# Patient Record
Sex: Female | Born: 1935 | Race: White | Hispanic: No | Smoking: Former smoker
Health system: Southern US, Community
[De-identification: ages and names within clinical notes are randomized; demographics above are authoritative.]

## PROBLEM LIST (undated history)

## (undated) DIAGNOSIS — Z8619 Personal history of other infectious and parasitic diseases: Secondary | ICD-10-CM

## (undated) DIAGNOSIS — F32A Depression, unspecified: Secondary | ICD-10-CM

## (undated) DIAGNOSIS — Z87898 Personal history of other specified conditions: Secondary | ICD-10-CM

## (undated) DIAGNOSIS — E039 Hypothyroidism, unspecified: Secondary | ICD-10-CM

## (undated) DIAGNOSIS — Z862 Personal history of diseases of the blood and blood-forming organs and certain disorders involving the immune mechanism: Secondary | ICD-10-CM

## (undated) DIAGNOSIS — F329 Major depressive disorder, single episode, unspecified: Secondary | ICD-10-CM

## (undated) DIAGNOSIS — I10 Essential (primary) hypertension: Secondary | ICD-10-CM

## (undated) DIAGNOSIS — E785 Hyperlipidemia, unspecified: Secondary | ICD-10-CM

## (undated) DIAGNOSIS — Z974 Presence of external hearing-aid: Secondary | ICD-10-CM

## (undated) DIAGNOSIS — M1990 Unspecified osteoarthritis, unspecified site: Secondary | ICD-10-CM

## (undated) DIAGNOSIS — M810 Age-related osteoporosis without current pathological fracture: Secondary | ICD-10-CM

## (undated) HISTORY — DX: Major depressive disorder, single episode, unspecified: F32.9

## (undated) HISTORY — DX: Personal history of other specified conditions: Z87.898

## (undated) HISTORY — DX: Presence of external hearing-aid: Z97.4

## (undated) HISTORY — DX: Personal history of other infectious and parasitic diseases: Z86.19

## (undated) HISTORY — DX: Depression, unspecified: F32.A

## (undated) HISTORY — DX: Age-related osteoporosis without current pathological fracture: M81.0

## (undated) HISTORY — DX: Unspecified osteoarthritis, unspecified site: M19.90

## (undated) HISTORY — DX: Hyperlipidemia, unspecified: E78.5

## (undated) HISTORY — DX: Essential (primary) hypertension: I10

## (undated) HISTORY — DX: Hypothyroidism, unspecified: E03.9

## (undated) HISTORY — DX: Personal history of diseases of the blood and blood-forming organs and certain disorders involving the immune mechanism: Z86.2

---
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
## 1972-12-30 HISTORY — PX: GALLBLADDER SURGERY: SHX652

## 1972-12-30 HISTORY — PX: APPENDECTOMY: SHX54

## 1977-12-30 HISTORY — PX: BACK SURGERY: SHX140

## 1985-12-30 HISTORY — PX: KNEE SURGERY: SHX244

## 1996-12-30 HISTORY — PX: KNEE SURGERY: SHX244

## 1998-04-03 ENCOUNTER — Other Ambulatory Visit: Admission: RE | Admit: 1998-04-03 | Discharge: 1998-04-03 | Payer: Self-pay | Admitting: Family Medicine

## 1998-04-10 ENCOUNTER — Other Ambulatory Visit: Admission: RE | Admit: 1998-04-10 | Discharge: 1998-04-10 | Payer: Self-pay | Admitting: Family Medicine

## 1998-12-30 LAB — HM DEXA SCAN: HM Dexa Scan: NORMAL

## 2001-10-16 ENCOUNTER — Other Ambulatory Visit: Admission: RE | Admit: 2001-10-16 | Discharge: 2001-10-16 | Payer: Self-pay | Admitting: *Deleted

## 2002-03-23 ENCOUNTER — Encounter: Admission: RE | Admit: 2002-03-23 | Discharge: 2002-03-23 | Payer: Self-pay | Admitting: *Deleted

## 2005-02-08 ENCOUNTER — Inpatient Hospital Stay (HOSPITAL_COMMUNITY): Admission: RE | Admit: 2005-02-08 | Discharge: 2005-02-12 | Payer: Self-pay | Admitting: Orthopedic Surgery

## 2005-02-08 ENCOUNTER — Ambulatory Visit: Payer: Self-pay | Admitting: Physical Medicine & Rehabilitation

## 2006-12-30 HISTORY — PX: KNEE SURGERY: SHX244

## 2007-05-02 ENCOUNTER — Emergency Department (HOSPITAL_COMMUNITY): Admission: EM | Admit: 2007-05-02 | Discharge: 2007-05-02 | Payer: Self-pay | Admitting: Emergency Medicine

## 2007-05-09 ENCOUNTER — Inpatient Hospital Stay (HOSPITAL_COMMUNITY): Admission: RE | Admit: 2007-05-09 | Discharge: 2007-05-11 | Payer: Self-pay | Admitting: Orthopedic Surgery

## 2007-05-11 ENCOUNTER — Ambulatory Visit: Payer: Self-pay | Admitting: Vascular Surgery

## 2007-07-27 ENCOUNTER — Encounter: Admission: RE | Admit: 2007-07-27 | Discharge: 2007-08-27 | Payer: Self-pay | Admitting: Orthopedic Surgery

## 2008-06-21 ENCOUNTER — Encounter: Admission: RE | Admit: 2008-06-21 | Discharge: 2008-07-18 | Payer: Self-pay | Admitting: Orthopedic Surgery

## 2008-10-19 ENCOUNTER — Encounter: Admission: RE | Admit: 2008-10-19 | Discharge: 2008-10-19 | Payer: Self-pay | Admitting: Internal Medicine

## 2008-12-30 HISTORY — PX: CARDIOVASCULAR STRESS TEST: SHX262

## 2008-12-30 HISTORY — PX: US ECHOCARDIOGRAPHY: HXRAD669

## 2009-05-30 DEATH — deceased

## 2009-07-18 ENCOUNTER — Inpatient Hospital Stay (HOSPITAL_COMMUNITY): Admission: RE | Admit: 2009-07-18 | Discharge: 2009-07-20 | Payer: Self-pay | Admitting: Orthopedic Surgery

## 2009-07-31 ENCOUNTER — Ambulatory Visit: Payer: Self-pay | Admitting: Surgery

## 2009-07-31 ENCOUNTER — Ambulatory Visit: Admission: RE | Admit: 2009-07-31 | Discharge: 2009-07-31 | Payer: Self-pay | Admitting: Orthopedic Surgery

## 2009-07-31 ENCOUNTER — Encounter (INDEPENDENT_AMBULATORY_CARE_PROVIDER_SITE_OTHER): Payer: Self-pay | Admitting: Orthopedic Surgery

## 2009-08-23 ENCOUNTER — Encounter: Admission: RE | Admit: 2009-08-23 | Discharge: 2009-09-28 | Payer: Self-pay | Admitting: Orthopedic Surgery

## 2010-07-31 ENCOUNTER — Encounter: Admission: RE | Admit: 2010-07-31 | Discharge: 2010-07-31 | Payer: Self-pay | Admitting: Nurse Practitioner

## 2010-09-04 ENCOUNTER — Encounter: Admission: RE | Admit: 2010-09-04 | Discharge: 2010-09-04 | Payer: Self-pay | Admitting: Internal Medicine

## 2010-12-30 HISTORY — PX: CATARACT EXTRACTION: SUR2

## 2011-04-07 LAB — URINALYSIS, ROUTINE W REFLEX MICROSCOPIC
Hgb urine dipstick: NEGATIVE
Nitrite: NEGATIVE
Specific Gravity, Urine: 1.026 (ref 1.005–1.030)
Urobilinogen, UA: 0.2 mg/dL (ref 0.0–1.0)

## 2011-04-07 LAB — BASIC METABOLIC PANEL
CO2: 28 mEq/L (ref 19–32)
CO2: 27 mEq/L (ref 19–32)
Calcium: 9.1 mg/dL (ref 8.4–10.5)
Calcium: 9.2 mg/dL (ref 8.4–10.5)
Calcium: 10.2 mg/dL (ref 8.4–10.5)
Creatinine, Ser: 0.85 mg/dL (ref 0.4–1.2)
GFR calc Af Amer: >60 mL/min (ref >60)
GFR calc Af Amer: >60 mL/min (ref >60)
GFR calc non Af Amer: >60 mL/min (ref >60)
GFR calc non Af Amer: 58 mL/min — ABNORMAL LOW (ref >60)
Glucose, Bld: 102 mg/dL — ABNORMAL HIGH (ref 70–99)
Glucose, Bld: 104 mg/dL — ABNORMAL HIGH (ref 70–99)
Sodium: 141 mEq/L (ref 135–145)
Sodium: 140 mEq/L (ref 135–145)

## 2011-04-07 LAB — CBC
Hemoglobin: 9.5 g/dL — ABNORMAL LOW (ref 12.0–15.0)
Hemoglobin: 12.5 g/dL (ref 12.0–15.0)
MCHC: 33.5 g/dL (ref 30.0–36.0)
RBC: 4.42 MIL/uL (ref 3.87–5.11)
RDW: 13.8 % (ref 11.5–15.5)
RDW: 14.1 % (ref 11.5–15.5)
WBC: 5.1 10*3/uL (ref 4.0–10.5)

## 2011-04-07 LAB — TYPE AND SCREEN: ABO/RH(D): A POS

## 2011-04-07 LAB — DIFFERENTIAL
Lymphocytes Relative: 22 % (ref 12–46)
Lymphs Abs: 1.1 10*3/uL (ref 0.7–4.0)
Monocytes Absolute: 0.4 10*3/uL (ref 0.1–1.0)
Monocytes Relative: 8 % (ref 3–12)
Neutro Abs: 3.4 10*3/uL (ref 1.7–7.7)
Neutrophils Relative %: 67 % (ref 43–77)

## 2011-04-07 LAB — PROTIME-INR: INR: 1 (ref 0.00–1.49)

## 2011-04-07 LAB — APTT: aPTT: 31 seconds (ref 24–37)

## 2011-04-07 LAB — ABO/RH: ABO/RH(D): A POS

## 2011-05-14 NOTE — Op Note (Signed)
[name], [name]                ACCOUNT NO.:  [id number]   MEDICAL RECORD NO.:  [id number]          PATIENT TYPE:  INP   LOCATION:  2550                         FACILITY:  [facility]   PHYSICIAN:  Doran Heater. Veverly Fells, M.D. DATE OF BIRTH:  [birth year]   DATE OF PROCEDURE:  DATE OF DISCHARGE:                               OPERATIVE REPORT   PREOPERATIVE DIAGNOSIS:  Right displaced three-part proximal humerus  fracture.   POSTOPERATIVE DIAGNOSIS:  Right displaced three-part proximal humerus  fracture.   PROCEDURE PERFORMED:  Open reduction and internal fixation of right  three-part proximal humerus fracture using DePuy SNP nail plate system.   SURGEON:  Doran Heater. Veverly Fells, M.D.   ASSISTANT:  Abbott Pao. Doren Custard, P.A.   ANESTHESIA:  General anesthesia was used.   ESTIMATED BLOOD LOSS:  300 mL.   FLUID REPLACEMENT:  2200 mL crystalloid.   URINE OUTPUT:  500 mL.   INSTRUMENT COUNT:  Correct.   COMPLICATIONS:  None.   ANTIBIOTICS:  Preoperative antibiotics were given.   INDICATION:  The patient is a 77 year old female who suffered a fall,  injuring her right proximal arm.  The patient complained of immediate  shoulder pain and loss of shoulder function.  She presented to  orthopedics with a displaced three part proximal humerus fracture.  I  discussed the options for management with the patient and her family  including conservative management, which I would not recommend based on  significant displacement of her greater tuberosity which contains a  rotator cuff attachment as well as severe valgus impaction of her  humeral head.  I discussed with the family the option for attempted open  reduction and internal fixation using a nail plate system to restore  proximal humeral anatomy and keep her humeral head and soft tissue  attachments versus hemiarthroplasty which would be the fall back plan  should we not be able to obtain a stable construct proximally with the  nail plate.  The  patient elected to proceed with the surgical  management.  She has signed informed consent.   DESCRIPTION OF PROCEDURE:  After an adequate level of anesthesia was  achieved, the patient was positioned in the modified beach chair  position.  All neurovascular structures were padded appropriately.  The  right shoulder was sterilely prepped and draped in the usual manner.  We  approached the shoulder through an anterior deltopectoral incision.  We  took the deltopectoral laterally to the cephalic vein, the pectoralis  medially, released the upper centimeter of pectoralis insertion on the  humerus, and a little bit of the deltoid insertion as well.  She was  extremely muscular and the deltoid seemed to sweep across more medially  than normal.  We first identified the fracture site.  We took the  conjoint tendon medially.  Placed a self-retaining retractor, identified  the fracture site and the greater tuberosity.  We were able to fish the  greater tuberosity around from the posterior aspect of the shoulder and  it retracted way around the back.  We were able to find it, grab it and  place  a total three #2 FiberWire sutures in a mattress fashion posterior  to the greater tuberosity and into the rotator cuff.  We had excellent  purchase and control of the greater tuberosity.  Next, we identified the  fractured humerus.  We delivered the shaft so that we could see that  easily.  We went ahead and took a rongeur and bit out a hole for the  nail plate system to sit on the lateral humerus.  We placed the nail  plate on its insertion handle into the humerus and then used that to  lever the fractured humeral head back into position.  Once we had this  in anatomic or near anatomic position, we went ahead and placed a  central pin to hold the position and then placed our upper two smooth  pegs which this is a locking plate system.  The pegs locked into place  and held the head in good position.  X-rays  demonstrated excellent  alignment.  We placed our remaining three pegs gaining excellent  purchase in the humeral head.  Next, we went ahead and placed our three  unicortical screws through the lateral cortex of the humerus and into  the nail plate, pulling the cortex pulling the nail against the lateral  cortex and reducing the fracture even more.  This gave [place] an anatomic  reduction both in the AP and lateral plane.  We then bone grafted using  morselized cancellous chips allograft which had been soaked in blood and  bone grafted the large cancellous defect and metaphyseal defect present  around the pegs and then went ahead and brought the greater tuberosity  into place and sutured that to the subscapularis over the lesser  tuberosity gaining a nice tuberosity to tuberosity purchase.  We had a  low profile repair.  We took the shoulder through a range of motion.  Everything moved as a unit.  At this point, we thoroughly irrigated and  closed the deltopectoral interval with interrupted 0 Vicryl sutures,  followed by 2-0 Vicryl subcutaneous closure and 4-0 Monocryl for skin.  Steri-Strips applied, followed by sterile dressing.  The patient  tolerated the surgery well.      Doran Heater. Veverly Fells, M.D.  Electronically Signed     SRN/MEDQ  D:  05/09/2007  T:  05/09/2007  Job:  [id number]

## 2011-05-14 NOTE — Op Note (Signed)
[name], [name]                ACCOUNT NO.:  [id number]   MEDICAL RECORD NO.:  [id number]          PATIENT TYPE:  INP   LOCATION:  0011                         FACILITY:  [facility]   PHYSICIAN:  Pietro Cassis. Alvan Dame, M.D.  DATE OF BIRTH:  09-Mar-1930   DATE OF PROCEDURE:  07/18/2009  DATE OF DISCHARGE:                               OPERATIVE REPORT   PREOPERATIVE DIAGNOSIS:  Left knee osteoarthritis.   POSTOPERATIVE DIAGNOSIS:  Left knee osteoarthritis.   PROCEDURE:  Left total knee replacement.   COMPONENTS USED:  DePuy rotating platform posterior stabilized knee  system with a size 5 femur, 4 tibia, 10-mm insert and a 41 patellar  button.   SURGEON:  Pietro Cassis. Alvan Dame, M.D.   ASSISTANT:  Carlean Jews. Mann, PA.   ANESTHESIA:  Spinal.   DRAINS:  One Hemovac.   TOURNIQUET TIME:  Forty minutes at 250 mmHg.   COMPLICATIONS:  None.   BLOOD LOSS:  Minimal.   SPECIMEN:  None.   INDICATIONS:  [name] is a 77 year old female patient of mine from  previous right knee revision surgery.  She had progressive degenerative  changes to the left knee with pain that was unresponsive to conservative  measures.  She at this point wished to proceed with arthroplasty as an  option.  The risks and benefits were reviewed, including infection, DVT,  component failure, need for revision surgery, stiffness, including the  postoperative course and expectations.  Consent was obtained for the  benefit of pain relief.   PROCEDURE IN DETAIL:  The patient was brought to the operative theater.  Once adequate anesthesia and preoperative antibiotics were administered,  the patient was positioned supine.  A proximal thigh tourniquet was  placed.  The left lower extremity was then pre-scrubbed, prepped and  draped in a sterile fashion.   A timeout was performed identifying the patient, planned procedure and  extremity.  Once this was done, the patient's foot was placed in demand  leg holder and tourniquet  elevated.  The leg was exsanguinated to 250  mmHg.  A midline incision was made followed by a median arthrotomy.  Following initial debridement, attention was directed to the patella.  The patient's patella was noted to be significantly worn and the highest  apex point was at the 21 mm site.  I resected down to 41 mm and restored  the height of the patella using a 41 patellar button which fit the cut  surface great.   Synovium was debrided about the patella.   A metal shim was placed into the lug holes and protected from the saw  blades and retractors.  Attention was now directed to the femur.  The  femoral canal was open with a drill and irrigated to prevent fat emboli.  Intramedullary rod was passed in 3 degrees of valgus.  I resected 10 mm  of bone off the distal femur due to a flexion contracture.   Attention was now directed to the tibia with the tibia subluxating  anteriorly and retractors placed.  An extramedullary guide was placed  and based on the  lateral proximal tibia, 10 mm of bone was resected.  I  checked my extension gap and was found that the 10-mm insert felt nice  with stable medial and lateral ligaments.   At this point, I checked and confirmed that the cut was perpendicular  with an alignment rod in the coronal plane and straight down the mid  shaft of the tibia.   Based on this cut, the rotation of the femur was set based on this.  I  sized the femur to be a size 5 and then placed the 3 degree  intramedullary rod with the rotation block and then set the rotation  based on the proximal cut of the tibia.  It was pinned into position and  exchanged for the 4-in-1 cutting block.  Anterior-posterior and chamfer  cuts were then made without difficulty, no notching.  Final box cut was  made on the lateral aspect of the distal femur.  Attention was now  redirected back to the tibia.  Tibial cut surface seemed to fit best  with a size 4 tibial tray.  The tibial tray was  pinned in position,  drilled and keel punched.  At this point trial reduction was carried out  with a 5 femur, 4 tibial and 10 mm insert.  The patella tracked to the  trochlea through extension and flexion.  The knee ligaments were stable  with extension and flexion.  Given these findings, the trial components  were removed and we irrigated the knee with normal saline solution,  removed any debris, injected the synovial capsule junction with quarter-  percent Marcaine with epinephrine and 1 mL of Toradol for a total of 61  mL.  The cement was mixed and final components were then cemented into  position.  The knee was brought to extension with a 10 mm insert.  Extruded cement was removed.  Once cement had cured and excessive cement  was removed from the around the knee, the knee was brought into full  extension.  The final polyethylene was placed.  The knee was reirrigated  with saline solution.  The extensor mechanism was then reapproximated  using #1 Vicryl.  I did place a medium Hemovac drain deep.  The  tourniquet was let down at 40 minutes at 250 mmHg.   The remainder of the wound was closed with 2-0 Vicryl and running 4-0  Monocryl.  The knee was cleaned, dried and dressed sterilely with Steri-  Strips and bulky skin wrap.  She was brought to the recovery room in  stable condition tolerating the procedure well.      Pietro Cassis Alvan Dame, M.D.  Electronically Signed     MDO/MEDQ  D:  07/18/2009  T:  07/19/2009  Job:  [id number]

## 2011-05-14 NOTE — Discharge Summary (Signed)
[name], [name]                ACCOUNT NO.:  [id number]   MEDICAL RECORD NO.:  [id number]          PATIENT TYPE:  INP   LOCATION:  5011                         FACILITY:  [facility]   PHYSICIAN:  [name]. [name], M.D. DATE OF BIRTH:  07/26/1930   DATE OF ADMISSION:  05/09/2007  DATE OF DISCHARGE:  05/11/2007                               DISCHARGE SUMMARY   ADMISSION DIAGNOSIS:  Right proximal humerus fracture.   DISCHARGE DIAGNOSIS:  Right proximal humerus fracture status post open  reduction, internal fixation.   PROCEDURE:  The patient had a right proximal humerus ORIF by Dr. Esmond Plants on May 09, 2007.   ASSISTANT:  Abbott Pao. Dixon, PA-C   General anesthesia was used, 300 mL of estimated blood loss.  Fluid  replacement was 2200 mL.  No complications.   HOSPITAL COURSE:  The patient is a 77 year old female.  She suffered a  fall and injured her right proximal humerus.  The patient had come into  the office, and after a lengthy discussion, the patient elected to have  a right proximal humerus ORIF by Dr. Veverly Wu.  The patient had the above-  stated procedure without any complications on May 09, 2007.  She  tolerated the procedure well.  After adequate time in the postanesthetic  care unit, she was transferred up to 5000.  Postop day 1, the patient  complained of some moderate pain in the right shoulder, but otherwise  doing okay.  Postop day 2, she said her pain is improved, and muscle  spasms had improved.  She was in a sling.  Dressing was changed.  No  signs of erythema or infection.  Neurologically, she is intact distally,  and capillary refill is less than 2 seconds.  Her labs were within  normal limits.   DISCHARGE PLANNING:  The patient will be discharged home on May 11, 2007, with family and home health physical therapy.   FOLLOWUP:  The patient will follow up with Dr. Esmond Plants in 7-10  days.   Her condition is stable.   Her diet is regular.   The  patient has no known drug allergies.   MEDICATIONS:  1. Diltiazem 240 mg p.o. daily.  2. Pepcid 20 mg p.o. b.i.d.  3. Prozac 20 mg p.o. daily.  4. Levothyroxine 175 mcg p.o. daily.  5. Lisinopril 40 mg p.o. daily.  6. Robaxin 500 mg p.o. q.6.  7. Vicodin 5 mg 1-2 tabs q.4-6 h. p.r.n. for pain.      [name], P.A.      [name]. [name], M.D.  Electronically Signed    TBD/MEDQ  D:  05/11/2007  T:  05/11/2007  Job:  [id number]

## 2011-05-14 NOTE — H&P (Signed)
[name], ASANO                ACCOUNT NO.:  [id number]   MEDICAL RECORD NO.:  [id number]          PATIENT TYPE:  INP   LOCATION:  NA                           FACILITY:  [facility]   PHYSICIAN:  Pietro Cassis. Alvan Dame, M.D.  DATE OF BIRTH:  13-Dec-1930   DATE OF ADMISSION:  07/18/2009  DATE OF DISCHARGE:                              HISTORY & PHYSICAL   PROCEDURE:  A left total knee replacement.   CHIEF COMPLAINT:  Left knee pain.   HISTORY OF PRESENT ILLNESS:  A 77 year old female with a history of left  knee pain secondary to osteoarthritis that has been refractory to all  conservative treatment.   PRIMARY CARE PHYSICIAN:  Dr. Delia Chimes.   PAST MEDICAL HISTORY:  1. Osteoarthritis.  2. Hypertension.  3. Reflux disease.  4. Hyperthyroidism.   PAST SURGERIES:  1. Right shoulder surgery.  2. Revision right total knee replacement.   FAMILY HISTORY:  Cancer.   SOCIAL HISTORY:  Widowed.  Retired.  Primary caregiver will be her  daughter in the home.   DRUG ALLERGIES:  NO KNOWN DRUG ALLERGIES.   MEDICATIONS:  1. Diltiazem 240 mg p.o. daily.  2. Lisinopril 40 mg p.o. b.i.d.  3. Fluoxetine 20 mg p.o. daily.  4. Levothyroxine 175 mcg half p.o. daily.  5. Klor-Con 20 mEq p.o. daily.  6. Furosemide 80 mg one p.o. daily.  7. Ranitidine 150 mg two p.o. b.i.d.  8. Calcium 600 mg p.o. b.i.d.  9. Tylenol p.r.n.  10.Vicodin 5/500 two p.o. daily p.r.n.   REVIEW OF SYSTEMS:  RESPIRATORY:  Last chest x-ray was 2 months ago.  CARDIOVASCULAR:  Last EKG was on June 20, 2009.  MUSCULOSKELETAL:  She  has joint pain.  Otherwise, see HPI.   PHYSICAL EXAMINATION:  Pulse 72.  Respirations 16.  Blood pressure  114/70.  GENERAL:  Awake, alert, and oriented.  HEENT:  Normocephalic.  NECK:  Supple.  No carotid bruits.  CHEST:  Lungs clear to auscultation bilaterally.  BREASTS:  Deferred.  HEART:  S1 and S2, distinct.  ABDOMEN:  Soft.  Bowel sounds present.  PELVIS:  Stable.  GENITOURINARY:   Deferred.  EXTREMITIES:  Left knee pain with weightbearing and range of motion.  Uses a single-point cane.  SKIN:  No cellulitis.  NEUROLOGIC:  Intact distal sensibilities.   LABORATORY DATA:  Labs, EKG, chest x-ray all pending presurgical  testing.   IMPRESSION:  Left knee osteoarthritis.   PLAN OF ACTION:  Left total knee replacement, Dr. Alvan Dame at [facility] on  July 18, 2009.     ______________________________  Carlean Jews. Collene Mares, [place]      Pietro Cassis. Alvan Dame, M.D.  Electronically Signed    BLM/MEDQ  D:  07/14/2009  T:  07/14/2009  Job:  [id number]   cc:   Delia Chimes, M.D.

## 2011-05-14 NOTE — Discharge Summary (Signed)
NAMEJENNEAN, [name]                ACCOUNT NO.:  [id number]   MEDICAL RECORD NO.:  [id number]          PATIENT TYPE:  INP   LOCATION:  63                         FACILITY:  [facility]   PHYSICIAN:  Pietro Cassis. Alvan Dame, M.D.  DATE OF BIRTH:  [birth year]   DATE OF ADMISSION:  07/18/2009  DATE OF DISCHARGE:  07/20/2009                               DISCHARGE SUMMARY   ADMITTING DIAGNOSES:  1. Osteoarthritis.  2. Hypertension.  3. Reflux disease.  4. Hypothyroidism.   DISCHARGE DIAGNOSES:  1. Osteoarthritis.  2. Hypertension.  3. Reflux disease.  4. Hypothyroidism.   HISTORY OF PRESENT ILLNESS:  [name] with a history  of left knee pain secondary to osteoarthritis refractory to all  conservative treatment.   PROCEDURE:  Left total knee replacement by surgeon, Dr. Paralee Cancel.  Assistant Rowan Blase P.A.-C.   CONSULTS:  None.   LABORATORY DATA:  CBC final reading:  White blood cell count 7.7,  hemoglobin 9.5, hematocrit 28.7, platelets 186.  Metabolic:  Sodium Q000111Q,  potassium 2.6, BUN 9, creatinine 0.77, glucose 104.   HOSPITAL COURSE:  The patient underwent a left total knee replacement,  admitted to the orthopedic floor, and tolerated the procedure well.  She  remained hemodynamically and orthopedically stable throughout her course  stay.  By day 2, she was afebrile, and she was ready to go home.  Dressing was changed, and no significant drainage from the wound.  She  was neurovascularly in this left lower extremity with ability to a  straight leg raise.  She was weightbearing as tolerated and was ready  for discharge.   DISCHARGE DISPOSITION:  Discharged home in stable and improved condition  with home health care PT.   DISCHARGE PHYSICAL THERAPY:  Weightbearing as tolerated with use of  rolling walker.  Goals of physical therapy 0 to 120 degrees by 6 weeks.   DISCHARGE MEDICATIONS:  1. Aspirin 325 mg p.o. b.i.d. for 6 weeks.  2. Robaxin 500 mg p.o. q.6  h.  3. Iron 325 mg p.o. t.i.d.  4. Colace 100 mg p.o. b.i.d.  5. MiraLax 17 g p.o. daily.  6. Norco 7.5/325 one to two p.o. q.4-6 h. p.r.n. pain.  7. Diltiazem 240 mg p.o. daily.  8. Lisinopril 40 mg p.o. b.i.d.  9. Fluoxetine 20 mg p.o. daily.  10.Levofloxacin 175 mcg half p.o. daily.  11.Klor-Con 20 mEq p.o. daily.  12.Lasix 80 mg 1 p.o. daily.  13.Ranitidine 150 mg 2 p.o. b.i.d.  14.Calcium 600 mg p.o. b.i.d.  15.Tylenol p.r.n.  16.Celebrex 200 mg 1 p.o. b.i.d. x2 weeks after surgery.   DISCHARGE FOLLOWUP:  Follow with Dr. Alvan Dame at phone number [phone] in 2  weeks for wound check.     ______________________________  Carlean Jews. Collene Mares, [place]      Pietro Cassis. Alvan Dame, M.D.  Electronically Signed    BLM/MEDQ  D:  07/20/2009  T:  07/20/2009  Job:  [id number]   cc:   Delia Chimes

## 2011-05-17 NOTE — Discharge Summary (Signed)
[name], [name]                ACCOUNT NO.:  [id number]   MEDICAL RECORD NO.:  [id number]          PATIENT TYPE:  INP   LOCATION:  [place]                         FACILITY:  [facility]   PHYSICIAN:  Pietro Cassis. Alvan Dame, M.D.  DATE OF BIRTH:  1930/08/18   DATE OF ADMISSION:  02/08/2005  DATE OF DISCHARGE:                                 DISCHARGE SUMMARY   ADMITTING DIAGNOSES:  1.  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  2.  Hypertension.  3.  Gastroesophageal reflux disease.  4.  History of atrial fibrillation.  5.  Hypothyroidism.   DISCHARGE DIAGNOSES:  1.  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  2.  Hypertension.  3.  Gastroesophageal reflux disease.  4.  History of atrial fibrillation.  5.  Hypothyroidism.  6.  Mild postoperative anemia.   OPERATION:  On February 08, 2005 the patient underwent revision right total  knee replacement with polyethylene exchange component.  Verner Chol,  P.A.-C. assisted.   BRIEF HISTORY:  This 77 year old lady had radiographic evidence and concern  for a catastrophic polyethylene failure with near metal-on-metal  articulation of the total knee component seen on radiographs.  She was  referred to Dr. Alvan Dame through the courtesy of Dr. Onnie Graham.  She has catching,  popping, and pain into the knee with ambulation.  She is now 10-15 years  post total knee replacement arthroplasty and was doing well until present  illness began to diminish her day-to-day activities.  After much discussion  including the pros and cons of surgical intervention, we decided to go ahead  with the above procedure.   COURSE IN THE HOSPITAL:  The patient tolerated the surgical procedure quite  well.  She was entered into the total knee protocol.  She certainly knew how  to do this as she was in relatively good condition prior to the surgery.  She was allowed weightbearing as tolerated and eventually was able to  achieve 60 feet ambulation down the  hall.  We had asked for a rehab consult  as she must care for her handicapped daughter but she was doing so well  candidacy in that program was not desirous for her level of activity.  We  therefore focused home health and that is being arranged through [place]  with physical therapy as well as a home aide.  She will plan to go with her  granddaughter after discharge and that is perfectly all right with [place] and  think it is a good idea.  Primary concern of ours is of course her safety  with her level of activity.   On the day of discharge her knee wound was clean and dry, calf was soft,  neurovascular was intact to the operative extremity.  Vital signs were  stable.   Laboratory values in the hospital hematologically showed a CBC  preoperatively completely within normal limits, hemoglobin was 13.2,  hematocrit was 39.8.  Final hemoglobin was 10.4 with hematocrit of 31.0.  Blood chemistries were essentially within normal limits other than a  slightly elevated glucose at 112.  Urinalysis negative  for urinary tract  infection.  A chest x-ray showed cardiomegaly without active lung disease  and that x-ray is from [facility] dated January 09, 2005.  Electrocardiogram showed normal sinus rhythm, rate of 66.   CONDITION ON DISCHARGE:  Improved, stable.   PLAN:  The patient is discharged to her granddaughter's home.  She is to  continue weightbearing as tolerated, home health with [place] including bath  aide.  She is to resume her home medications.  Our medications are:  1.  Vicodin #50 with a refill one to two q.4-6h. p.r.n. pain.  2.  Coumadin 7.5 mg #30 one daily at 6 p.m. for 4 weeks after the date of      surgery.  3.  Robaxin 500 mg #30 with three refills one q.6h. p.r.n. muscle spasm.   Return to see Dr. Alvan Dame about 2 weeks after surgery.  May use dry dressing to  the knee p.r.n.  May shower 4-5 days after surgery.  Wear knee immobilizer  when she is up and about.   Follow up with her family physician, Dr.  Ashby Dawes, as needed.      DLU/MEDQ  D:  02/12/2005  T:  02/12/2005  Job:  [id number]

## 2011-05-17 NOTE — Discharge Summary (Signed)
NAMEHIEU, KRISH                ACCOUNT NO.:  [id number]   MEDICAL RECORD NO.:  [id number]          PATIENT TYPE:  INP   LOCATION:  5011                         FACILITY:  [facility]   PHYSICIAN:  Doran Heater. Veverly Fells, M.D. DATE OF BIRTH:  1930/03/26   DATE OF ADMISSION:  05/09/2007  DATE OF DISCHARGE:  05/11/2007                               DISCHARGE SUMMARY   ADMISSION DIAGNOSIS:  Displaced right three-part proximal humerus  fracture.   DISCHARGE DIAGNOSIS:  Displaced right three-part proximal humerus  fracture.   PROCEDURE PERFORMED:  Open reduction and internal fixation of three-part  proximal humerus fracture using Depuy SNP (nail plate).   CONSULTING SERVICE:  Occupational therapy, discharge planning.   HISTORY OF PRESENT ILLNESS:  The patient is a 76 year old female who  suffered a fall, injuring her right shoulder.  The patient sustained a  three-part proximal humerus fracture.  She presented to orthopedics with  a displaced fracture and severe pain.  The patient presents now for  operative stabilization of her fracture.  For further details on the  patient's past medical history and physical examination, please see the  medical record.   HOSPITAL COURSE:  The patient was admitted to orthopedic surgery on May 09, 2007.  The patient was taken to surgery where an ORIF of the right  shoulder was performed.  The patient tolerated the surgery well,  remained afebrile postoperatively and was tolerating a regular diet.  Worked with occupational therapy for Codman's exercises, active wrist  and elbow range of motion.  The patient was scheduled for discharge on  May 11, 2007 to home on Percocet and Robaxin, pre-admit medications with  instructions to do her exercises.  She was also instructed on  appropriate wound care and sling use and will be seeing [place] in 7-10 days.      Doran Heater. Veverly Fells, M.D.  Electronically Signed     SRN/MEDQ  D:  06/10/2007  T:  06/10/2007  Job:   [id number]

## 2011-05-17 NOTE — Op Note (Signed)
Brittany, Wu                ACCOUNT NO.:  [id number]   MEDICAL RECORD NO.:  [id number]          PATIENT TYPE:  INP   LOCATION:  0011                         FACILITY:  [facility]   PHYSICIAN:  Pietro Cassis. Alvan Dame, M.D.  DATE OF BIRTH:  Aug 08, 1930   DATE OF PROCEDURE:  02/08/2005  DATE OF DISCHARGE:                                 OPERATIVE REPORT   PREOPERATIVE DIAGNOSIS:  Failed right total knee replacement.   POSTOPERATIVE DIAGNOSIS/FINDINGS:  Failed right total knee replacement,  polyethylene failure, and a right total knee replacement that had been  placed initially on February 16, 1991 by Dr. Achilles Dunk with Dr. Hart Robinsons  in assistance.   PROCEDURE:  Revision, right total knee replacement with polyethylene  exchange.   COMPONENTS USED:  Series II Osteonics knee replacement with Scorpio system,  size 7 base plate tibial tray, polyethylene removed with a size 8.  I placed  a size 10, posterior cruciate-retaining polyethylene liner.   SURGEON:  Pietro Cassis. Alvan Dame, M.D.   ASSISTANTElodia Florence. Delorse Lek, P.A.-C.   ANESTHESIA:  General.   BLOOD LOSS:  Zero.   TOURNIQUET TIME:  Fifty-five minutes at 300 mmHg.   COMPLICATIONS:  None apparent.   DRAINS:  One.   INDICATIONS FOR PROCEDURE:  [name] is a very pleasant 77 year old  female who was referred by Dr. Justice Britain in our practice for radiographic  evidence and concern for catastrophic polyethylene failure with near metal-  on-metal articulation on radiographs.  [name] was reportedly symptoms  of the knee catching and popping, and this was causing her pain.  Based on  the radiographic evidence and her complaint, the indication was to plan for  procedure for total knee replacement with polyethylene exchange but having  back-up if there was significant disruption to the metal surface of the  femur to perform a total knee revision.  Consent was obtained for that above  procedure.  Risks and benefits were  reviewed, including the fact that this  was going to be similar to a previous total knee replacement surgery in  terms of recovery.  She had time to answer questions, which were encouraged  and reviewed.   PROCEDURE IN DETAIL:  [name] was brought to the operating theater.  Once adequate anesthesia and preoperative antibiotics, 1 gm of Ancef, were  administered, the patient was positioned supine.  A proximal thigh  tourniquet was placed proximally along the right thigh.  The right lower  extremity was then prepped and draped in a sterile fashion.  The leg was  exsanguinated and the tourniquet elevated.  The patient's previous excision  was excised.  Sharp dissection to the extensor mechanism was carried out.  A  medial parapatellar arthrotomy was carried out, removing previous Ethibond  sutures.  The knee exposure was obtained.  There was noted to be fragments  of polyethylene around in the knee, which were debrided.  A complete knee  synovectomy was carried out secondary to polyethylene debris and  inflammatory tissue present.  The patient's previous polyethylene was  removed.  It was noted  to be delaminated and very thin but not fragmented.  Importantly, the patient's femoral condyle was stable with no evidence of  significant scratching or delamination on the metal surface.  The tibial  surface tray was noted to be stable as well.  Following the debridement of  the patellofemoral gutters and knee joint itself, including synovectomy, the  knee was copiously irrigated with normal saline solution.  The patient had a  size 8 polyethylene in.  I trialed a size 8.  Felt the knee was a little bit  loose and came into some slight hyperextension.  For that reason, chose to  use a size 10 polyethylene.  Care was taken to debride the posterior lateral  aspect of the knee, which had significant synovium, scar tissue, or a  persistent lateral portion of the meniscus that could have been causing  some  of the symptoms posteriorly.  Following this, the final size 7 x 10 mm  posterior cruciate retaining polyethylene liner was then packed into  position without complication.  Following this, the knee was re-examined and  irrigated.  A medium Hemovac drain was placed deep.  The wound was then  closed in layers with #1 PDS on the extensor mechanism followed by closure  of subcutaneous layer.  The skin was reapproximated using staples.  The knee  was then dressed in a bulky sterile dressing.  She was transferred to the  recovery room in stable condition.  The tourniquet was let down after the  wound was entirely closed.  The patient tolerated the procedure without  apparent complications.      MDO/MEDQ  D:  02/08/2005  T:  02/08/2005  Job:  [id number]

## 2011-05-17 NOTE — H&P (Signed)
NAMEGLENYCE, MUNNS                ACCOUNT NO.:  [id number]   MEDICAL RECORD NO.:  [id number]          PATIENT TYPE:   LOCATION:                                 FACILITY:   PHYSICIAN:  Pietro Cassis. Alvan Dame, M.D.  DATE OF BIRTH:  1930-07-27   DATE OF ADMISSION:  02/08/2005  DATE OF DISCHARGE:                                HISTORY & PHYSICAL   CHIEF COMPLAINT:  Right knee pain, failure of right total knee replacement.   HISTORY OF PRESENT ILLNESS:  Ms. Stary is a very pleasant 77 year old  female who was referred to my care from [place]. Supple, M.D.  Radiographs  in December had revealed that she had almost metal-on-metal articulation of  her right total knee replacement, and she was subsequently sent over for  evaluation for revision.  She does reveal that she has some pain and  clicking-type sensation in the knee, making it worrisome for potentially  complete failure, catastrophic failure of her liner.  Her total knee  replacement was performed about 10-15 years ago, exact records are available  in our hospital chart and will be made available for the surgical procedure.  She has not had any reports of fevers, chills, night sweats.  She had no  problems with her initial wound healing, and there is at this point no  concern for infection.  She reports no other constitutional symptoms of  weight loss.   PAST MEDICAL HISTORY:  1.  Hypertension.  2.  Gastroesophageal reflux disease.  3.  Atrial fibrillation.  4.  Hypothyroidism.   PAST SURGICAL HISTORY:  1.  Right total knee replacement approximately 10-15 years ago.  2.  Cholecystectomy and some lumbar spine surgery.   CURRENT MEDICATIONS:  Prinivil, Tiazac, fluoxetine, fish oil, Synthroid,  ranitidine, calcium, Tylenol.   FAMILY MEDICAL HISTORY:  Diabetes, lung cancer, brain tumor.   DRUG ALLERGIES:  No known drug allergies.   SOCIAL HISTORY:  She is retired.  Denies smoking and alcohol use.   REVIEW OF SYSTEMS:  Negative  for any upper respiratory, pulmonary, cardiac,  GI or any urinary tract issues at this point.   PHYSICAL EXAMINATION:  GENERAL:  Ms. Gresham is a very pleasant 77 year old  female.  She is awake, alert and oriented and cooperative.  Examination  reveals that she is normally-dressed and healthy-appearing.  VITAL SIGNS:  Today she is afebrile with stable vital signs with a blood  pressure of 150/84.  She has a pulse of 72 that was regular.  HEENT:  Examination of her head reveals a normal appearance of her eyes,  ears and nose, no evidence of any ulcers or wounds.  Her pupils are equal  and reactive.  NECK:  Examination of her neck reveals that she is supple, no evidence of  lymphadenopathy.  She has palpable pulses, no evidence of any bruit on  auscultation.  CHEST:  Clear to auscultation bilaterally.  CARDIAC:  Regular rate and rhythm, normal S1 and S2, with no evidence of any  murmur.  ABDOMEN:  Soft, nontender, no masses.  She has positive bowel sounds.  EXTREMITIES:  She has near-full extension with her right knee, good flexion.  She does have some crepitation in the knee and with the knee in extension to  flexion, she does have a little bit of medial and lateral play with some of  this clicking identified, which could be normal metal on poly versus metal  on metal.  She has no evidence of any cellulitis, no evidence of infection.  The incision is nicely healed.  Otherwise, she has palpable pulses, intact  motor and sensory function distally.   Labs are pending her evaluation at [facility].  X-rays:  Right knee x-rays  reveal near metal-on-metal articulation of her total knee replacement,  perhaps a very thin layer of polyethylene left.   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   PLAN:  I have reviewed with Ms. Reino that the current findings are  concerning for either complete fracture of the polyethylene that would  potentially require full revision  of her knee replacement if it had done  that and the metal has scratched.  If the polyethylene is still present  though incredibly thin, we will be able to revise the polyethylene only.  That will be our primary game plan and for that reason, we will have  instrumentation available for an Osteonics knee revision, which I believe we  will have the information on the sizing prior to that.  If her knee is  abnormal with scratching and marring of the femoral or tibial surfaces, we  will need to convert her to a total knee replacement, and this was reviewed.  She plans on participating in a joint camp-type activity preoperatively and  is ready to have this done.  Her surgery is scheduled for February 08, 2005.  Questions were encouraged and answered.  Consent will be obtained.      MDO/MEDQ  D:  01/30/2005  T:  01/30/2005  Job:  [id number]

## 2012-03-11 ENCOUNTER — Ambulatory Visit (INDEPENDENT_AMBULATORY_CARE_PROVIDER_SITE_OTHER): Payer: MEDICARE | Admitting: Family Medicine

## 2012-03-11 VITALS — BP 171/78 | HR 71 | Temp 98.4°F | Resp 16 | Ht 64.0 in | Wt 235.2 lb

## 2012-03-11 DIAGNOSIS — E785 Hyperlipidemia, unspecified: Secondary | ICD-10-CM | POA: Insufficient documentation

## 2012-03-11 DIAGNOSIS — J40 Bronchitis, not specified as acute or chronic: Secondary | ICD-10-CM

## 2012-03-11 DIAGNOSIS — J019 Acute sinusitis, unspecified: Secondary | ICD-10-CM

## 2012-03-11 DIAGNOSIS — I10 Essential (primary) hypertension: Secondary | ICD-10-CM | POA: Insufficient documentation

## 2012-03-11 DIAGNOSIS — R0981 Nasal congestion: Secondary | ICD-10-CM

## 2012-03-11 DIAGNOSIS — E039 Hypothyroidism, unspecified: Secondary | ICD-10-CM | POA: Insufficient documentation

## 2012-03-11 MED ORDER — HYDROCODONE-HOMATROPINE 5-1.5 MG/5ML PO SYRP: 5.0000 mL | ORAL_SOLUTION | Freq: Four times a day (QID) | ORAL | Status: AC | PRN

## 2012-03-11 MED ORDER — LEVOFLOXACIN 500 MG PO TABS: 500.0000 mg | ORAL_TABLET | Freq: Every day | ORAL | Status: AC

## 2012-03-11 NOTE — Patient Instructions (Signed)

## 2012-03-11 NOTE — Progress Notes (Signed)
78 yo woman with sinus congestion and cough, worsening for over 10 days.  Cough is keeping her awake.  No fever.  Phlegm is now thick and yellow.  Her sister has pneumonia  O:  NAD HEENT: unremarkable except mild SOM, nasal congestion Chest: few ronchi Heart:  Reg, no murmur Skin warm and dry  A: sinusitis > 10 days, bronchitis  P: hydromet and levoquin 500 qd x 7 days.

## 2012-03-11 NOTE — Progress Notes (Signed)
78 year old woman who comes in with sinus congestion and cough. Symptoms began about 2 weeks prior to this visit. She's accompanied by her daughter. She says she's had no fever. The phlegm that she's bringing up now was originally clear but is now yellow and thick. She has a sister who has pneumonia. Her primary care doctor, Delia Chimes, his closer practice.  Objective: Elderly woman, overweight, in no distress but very hard of hearing  HEENT: Unremarkable with the exception of mucopurulent discharge in the nose and serous otitis changes in both ears. She is definitely hard of hearing.  Oropharynx: Clear  Neck: Supple, no adenopathy  Chest: Few rhonchi otherwise clear  Heart: Faint 1/6 systolic ejection type murmur at the left heart border.  Assessment: Bronchitis with sinusitis, acute and greater than 10 days.  Plan: Levaquin 500 daily x7 with Hydromet.

## 2012-10-27 ENCOUNTER — Ambulatory Visit (INDEPENDENT_AMBULATORY_CARE_PROVIDER_SITE_OTHER): Payer: Medicare Other | Admitting: Family Medicine

## 2012-10-27 ENCOUNTER — Encounter: Payer: Self-pay | Admitting: Family Medicine

## 2012-10-27 VITALS — BP 136/80 | HR 68 | Temp 98.3°F | Ht 64.0 in | Wt 242.2 lb

## 2012-10-27 DIAGNOSIS — M129 Arthropathy, unspecified: Secondary | ICD-10-CM

## 2012-10-27 DIAGNOSIS — Z23 Encounter for immunization: Secondary | ICD-10-CM

## 2012-10-27 DIAGNOSIS — E785 Hyperlipidemia, unspecified: Secondary | ICD-10-CM

## 2012-10-27 DIAGNOSIS — I10 Essential (primary) hypertension: Secondary | ICD-10-CM

## 2012-10-27 DIAGNOSIS — F32A Depression, unspecified: Secondary | ICD-10-CM

## 2012-10-27 DIAGNOSIS — R0609 Other forms of dyspnea: Secondary | ICD-10-CM

## 2012-10-27 DIAGNOSIS — Z1211 Encounter for screening for malignant neoplasm of colon: Secondary | ICD-10-CM

## 2012-10-27 DIAGNOSIS — M6530 Trigger finger, unspecified finger: Secondary | ICD-10-CM

## 2012-10-27 DIAGNOSIS — D649 Anemia, unspecified: Secondary | ICD-10-CM

## 2012-10-27 DIAGNOSIS — R202 Paresthesia of skin: Secondary | ICD-10-CM

## 2012-10-27 DIAGNOSIS — F329 Major depressive disorder, single episode, unspecified: Secondary | ICD-10-CM

## 2012-10-27 DIAGNOSIS — G5600 Carpal tunnel syndrome, unspecified upper limb: Secondary | ICD-10-CM | POA: Insufficient documentation

## 2012-10-27 DIAGNOSIS — M1990 Unspecified osteoarthritis, unspecified site: Secondary | ICD-10-CM

## 2012-10-27 DIAGNOSIS — Z1231 Encounter for screening mammogram for malignant neoplasm of breast: Secondary | ICD-10-CM

## 2012-10-27 DIAGNOSIS — Z87898 Personal history of other specified conditions: Secondary | ICD-10-CM

## 2012-10-27 DIAGNOSIS — R0600 Dyspnea, unspecified: Secondary | ICD-10-CM | POA: Insufficient documentation

## 2012-10-27 DIAGNOSIS — R209 Unspecified disturbances of skin sensation: Secondary | ICD-10-CM

## 2012-10-27 DIAGNOSIS — F331 Major depressive disorder, recurrent, moderate: Secondary | ICD-10-CM | POA: Insufficient documentation

## 2012-10-27 DIAGNOSIS — E039 Hypothyroidism, unspecified: Secondary | ICD-10-CM

## 2012-10-27 LAB — COMPREHENSIVE METABOLIC PANEL
ALT: 14 U/L (ref 0–35)
Albumin: 3.6 g/dL (ref 3.5–5.2)
Alkaline Phosphatase: 52 U/L (ref 39–117)
CO2: 28 mEq/L (ref 19–32)
GFR: 63.97 mL/min (ref >60.00)
Glucose, Bld: 91 mg/dL (ref 70–99)
Potassium: 4.4 mEq/L (ref 3.5–5.1)
Sodium: 140 mEq/L (ref 135–145)
Total Bilirubin: 0.6 mg/dL (ref 0.3–1.2)
Total Protein: 7.2 g/dL (ref 6.0–8.3)

## 2012-10-27 LAB — LIPID PANEL: Total CHOL/HDL Ratio: 3

## 2012-10-27 LAB — CBC WITH DIFFERENTIAL/PLATELET
Basophils Absolute: 0 10*3/uL (ref 0.0–0.1)
Eosinophils Absolute: 0.1 10*3/uL (ref 0.0–0.7)
HCT: 38.8 % (ref 36.0–46.0)
Hemoglobin: 12.6 g/dL (ref 12.0–15.0)
Lymphs Abs: 1 10*3/uL (ref 0.7–4.0)
MCHC: 32.4 g/dL (ref 30.0–36.0)
Monocytes Absolute: 0.5 10*3/uL (ref 0.1–1.0)
Monocytes Relative: 10 % (ref 3.0–12.0)
Neutro Abs: 2.9 10*3/uL (ref 1.4–7.7)
Platelets: 200 10*3/uL (ref 150.0–400.0)
RDW: 14 % (ref 11.5–14.6)

## 2012-10-27 LAB — VITAMIN B12: Vitamin B-12: 349 pg/mL (ref 211–911)

## 2012-10-27 LAB — TSH: TSH: 1.91 u[IU]/mL (ref 0.35–5.50)

## 2012-10-27 NOTE — Assessment & Plan Note (Signed)
Chronic, stable.  Check FLP today as fasting. discussed administration of simvastatin at night time.

## 2012-10-27 NOTE — Assessment & Plan Note (Signed)
Check TSH today.  Could account for several of her sxs.

## 2012-10-27 NOTE — Assessment & Plan Note (Signed)
Monitor for now, if sxs persist refer to hand surgeon.

## 2012-10-27 NOTE — Patient Instructions (Signed)
Pneumonia shot today. Pass by [place]'s office to schedule mammogram. Stool kit today. Blood work today. Return in 1-2 months for medicare wellness visit. Good to meet you today, call us with questions.

## 2012-10-27 NOTE — Progress Notes (Signed)
Subjective:    Patient ID: [name], female    DOB: 1930-07-25, 78 y.o.   MRN: [id number]  HPI CC: new pt to establish  78 yo new to me, mother of [name], prior pt of Delia Chimes who has closed her practice, presents today to establish care.    SOB recently.  No smokers at home.  Denies h/o asthma or COPD.  + h/o anemia in past.  Endorses leg pains, s/p knee surgery in past.  Prior on fluid pills, no longer.  Uses compression stockings daily.  No significant orthopnea or cough.  HTN - reports compliance with meds.  At home BP running high.  In office today adequate.  HLD - compliant with meds.  Denies myalgias on simvastatin.  Advised to take at night.  Hearing impaired -wears aides but forgot to bring in today.  Pain in R middle finger - along with locking.  Longstanding. L hand numbness intermittently.  No weakness or shoulder/neck pain.  No recent blood work.  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  Unsure when last done. Mammogram - 2011 Tetanus - 2010 Flu - done 2013 Shingles shot - done per pt Pneumovax - requests today.  Medications and allergies reviewed and updated in chart.  Past histories reviewed and updated if relevant as below. Patient Active Problem List  Diagnosis  . Hypothyroid  . Hyperlipidemia  . Hypertension   Past Medical History  Diagnosis Date  . Arthritis   . History of chicken pox   . Depression   . Stomach ulcer 1990s  . HTN (hypertension)   . Hypothyroidism   . HLD (hyperlipidemia)    Past Surgical History  Procedure Date  . Cataract extraction     bilateral  . Gallbladder surgery 1974  . Appendectomy 1974  . Tonsillectomy 1945  . Back surgery 1979  . Knee surgery 1987    right  . Knee surgery 1998    left  . Knee surgery 2008   History  Substance  Use Topics  . Smoking status: Former Research scientist (life sciences)  . Smokeless tobacco: Never Used  . Alcohol Use: No   Family History  Problem Relation Age of Onset  . Cancer Mother     lung, nonsmoker  . Cancer Father     lung, nonsmoker  . Cancer Brother     lung, smoker  . Cancer Sister     brain  . Cancer Sister     lung, smoker  . COPD Sister     smoker  . Cancer Sister 80    colon  . Hypertension Daughter   . CAD Neg Hx   . Stroke Neg Hx   . Diabetes Daughter    No Known Allergies Current Outpatient Prescriptions on File Prior to Visit  Medication Sig Dispense Refill  . Calcium Carbonate-Vitamin D (CALCIUM 600+D) 600-400 MG-UNIT per tablet Take 1 tablet by mouth 2 (two) times daily.      Marland Kitchen diltiazem (TIAZAC) 240 MG 24 hr capsule Take 240 mg by mouth daily.      . diphenhydramine-acetaminophen (TYLENOL PM) 25-500 MG TABS Take 2 tablets by mouth at bedtime as needed.      Marland Kitchen FLUoxetine (PROZAC) 20 MG capsule Take 20 mg by mouth daily.      Marland Kitchen levothyroxine (SYNTHROID, LEVOTHROID) 175 MCG tablet Take  175 mcg by mouth daily. 1/2 tablet daily      . lisinopril (PRINIVIL,ZESTRIL) 40 MG tablet Take 40 mg by mouth 2 (two) times daily.       . ranitidine (ZANTAC) 150 MG tablet Take 150 mg by mouth at bedtime.       . simvastatin (ZOCOR) 20 MG tablet Take 20 mg by mouth at bedtime.          Review of Systems  Constitutional: Positive for unexpected weight change (10lb weight gain in 1 year). Negative for fever, chills, activity change, appetite change and fatigue.  HENT: Positive for rhinorrhea. Negative for hearing loss and neck pain.   Eyes: Negative for visual disturbance.  Respiratory: Positive for shortness of breath. Negative for cough, chest tightness and wheezing.   Cardiovascular: Positive for leg swelling (wears support hose). Negative for chest pain and palpitations.  Gastrointestinal: Negative for nausea, vomiting, abdominal pain, diarrhea, constipation, blood in stool and abdominal  distention.  Genitourinary: Negative for hematuria and difficulty urinating.  Musculoskeletal: Negative for myalgias and arthralgias.  Skin: Negative for rash.  Neurological: Positive for numbness (left hand). Negative for dizziness, seizures, syncope and headaches.  Hematological: Does not bruise/bleed easily.  Psychiatric/Behavioral: Negative for dysphoric mood. The patient is not nervous/anxious.        Objective:   Physical Exam  Nursing note and vitals reviewed. Constitutional: She is oriented to person, place, and time. She appears well-developed and well-nourished. No distress.  HENT:  Head: Normocephalic and atraumatic.  Right Ear: Hearing, tympanic membrane, external ear and ear canal normal.  Left Ear: Hearing, tympanic membrane, external ear and ear canal normal.  Nose: Nose normal.  Mouth/Throat: Oropharynx is clear and moist. No oropharyngeal exudate.  Eyes: Conjunctivae normal and EOM are normal. Pupils are equal, round, and reactive to light. No scleral icterus.  Neck: Normal range of motion. Neck supple. Carotid bruit is not present. No thyromegaly present.  Cardiovascular: Normal rate, regular rhythm, normal heart sounds and intact distal pulses.   No murmur heard. Pulses:      Radial pulses are 2+ on the right side, and 2+ on the left side.  Pulmonary/Chest: Effort normal and breath sounds normal. No respiratory distress. She has no wheezes. She has no rales.  Abdominal: Soft. Bowel sounds are normal. She exhibits no distension and no mass. There is no tenderness. There is no rebound and no guarding.  Musculoskeletal: Normal range of motion. She exhibits edema (moderate nonpitting edema).       Trigger finger R middle finger.  + wart on R lateral PIP  Lymphadenopathy:    She has no cervical adenopathy.  Neurological: She is alert and oriented to person, place, and time.       CN grossly intact, station and gait intact Grip strength intact. Sensation intact  Skin:  Skin is warm and dry. No rash noted.  Psychiatric: She has a normal mood and affect. Her behavior is normal. Judgment and thought content normal.       Assessment & Plan:  Sent home with iFOB.  Schedule mammogram today.  Pneumovax today. rtc 1-2 mo for medicare wellness visit.

## 2012-10-27 NOTE — Assessment & Plan Note (Signed)
Check CBC, TSH.  Monitor BP.  Lungs clear today, normal WOB.  No evidence of CHF exac currently. Strong fmhx lung CA.

## 2012-10-27 NOTE — Assessment & Plan Note (Signed)
Remote, stable.  Only on zantac.

## 2012-10-27 NOTE — Assessment & Plan Note (Signed)
Unclear dx, nonfocal neuro exam, sensation and strength intact today. Monitor for now.  Check B12 and folate

## 2012-10-27 NOTE — Assessment & Plan Note (Signed)
Chronic, stable. continue prozac

## 2012-10-27 NOTE — Assessment & Plan Note (Signed)
H/o this.  Recheck CBC today.

## 2012-10-27 NOTE — Assessment & Plan Note (Signed)
Discussed stopping meloxicam, taking tylenol prn.

## 2012-10-27 NOTE — Assessment & Plan Note (Signed)
Chronic, stable.   According to pt, running higher at home - contine to monitor.

## 2012-11-02 ENCOUNTER — Encounter: Payer: Self-pay | Admitting: Family Medicine

## 2012-11-09 ENCOUNTER — Encounter: Payer: Self-pay | Admitting: Family Medicine

## 2012-12-12 ENCOUNTER — Other Ambulatory Visit: Payer: Self-pay | Admitting: Family Medicine

## 2012-12-14 ENCOUNTER — Other Ambulatory Visit: Payer: Medicare Other

## 2012-12-15 ENCOUNTER — Other Ambulatory Visit: Payer: Medicare Other

## 2012-12-15 DIAGNOSIS — Z1211 Encounter for screening for malignant neoplasm of colon: Secondary | ICD-10-CM

## 2012-12-17 ENCOUNTER — Encounter: Payer: Self-pay | Admitting: Family Medicine

## 2012-12-17 ENCOUNTER — Ambulatory Visit (INDEPENDENT_AMBULATORY_CARE_PROVIDER_SITE_OTHER): Payer: Medicare Other | Admitting: Family Medicine

## 2012-12-17 ENCOUNTER — Ambulatory Visit (INDEPENDENT_AMBULATORY_CARE_PROVIDER_SITE_OTHER)
Admission: RE | Admit: 2012-12-17 | Discharge: 2012-12-17 | Disposition: A | Discharge status: Home / Self Care | Payer: Medicare Other | Source: Ambulatory Visit | Attending: Family Medicine | Admitting: Family Medicine

## 2012-12-17 VITALS — BP 142/82 | HR 80 | Temp 98.1°F | Ht 64.0 in | Wt 244.2 lb

## 2012-12-17 DIAGNOSIS — R0989 Other specified symptoms and signs involving the circulatory and respiratory systems: Secondary | ICD-10-CM

## 2012-12-17 DIAGNOSIS — M129 Arthropathy, unspecified: Secondary | ICD-10-CM

## 2012-12-17 DIAGNOSIS — E039 Hypothyroidism, unspecified: Secondary | ICD-10-CM

## 2012-12-17 DIAGNOSIS — I10 Essential (primary) hypertension: Secondary | ICD-10-CM

## 2012-12-17 DIAGNOSIS — M1990 Unspecified osteoarthritis, unspecified site: Secondary | ICD-10-CM

## 2012-12-17 DIAGNOSIS — E785 Hyperlipidemia, unspecified: Secondary | ICD-10-CM

## 2012-12-17 DIAGNOSIS — R0600 Dyspnea, unspecified: Secondary | ICD-10-CM

## 2012-12-17 DIAGNOSIS — R0609 Other forms of dyspnea: Secondary | ICD-10-CM

## 2012-12-17 DIAGNOSIS — Z0000 Encounter for general adult medical examination without abnormal findings: Secondary | ICD-10-CM

## 2012-12-17 NOTE — Assessment & Plan Note (Signed)
Chronic, stable. Great control on current regimen.  Continue statin.

## 2012-12-17 NOTE — Assessment & Plan Note (Addendum)
I have personally reviewed the Medicare Annual Wellness questionnaire and have noted 1. The patient's medical and social history 2. Their use of alcohol, tobacco or illicit drugs 3. Their current medications and supplements 4. The patient's functional ability including ADL's, fall risks, home safety risks and hearing or visual impairment. 5. Diet and physical activity 6. Evidence for depression or mood disorders The patients weight, height, BMI have been recorded in the chart.  Hearing and vision has been addressed. I have made referrals, counseling and provided education to the patient based review of the above and I have provided the pt with a written personalized care plan for preventive services. See scanned questionairre. Advanced directives discussed: provided with packet of info and asked her to check with family/friends on advanced directives.  Reviewed preventative protocols and updated unless pt declined. iFOB reassuring. Recent mammogram reassuring. rec schedule vision exam.

## 2012-12-17 NOTE — Assessment & Plan Note (Signed)
Chronic, stable. Continue meds. 

## 2012-12-17 NOTE — Assessment & Plan Note (Signed)
On tylenol prn.  Discussed addition of NSAID. Ulnar deviation evident and swelling of IPs.   ?RA consider eval for this.

## 2012-12-17 NOTE — Patient Instructions (Signed)
Schedule hearing eval if you would like. Schedule eye doctor appointment as you're due. Xray today to evaluate that shortness of breath. Stay as active as you can. Good to see you today, call us with quesitons. Packet of information for advanced directives today.

## 2012-12-17 NOTE — Progress Notes (Signed)
Subjective:    Patient ID: [name], female    DOB: 1930-02-12, 78 y.o.   MRN: [id number]  HPI CC: medicare wellness visit  Hearing screen failed - wears hearing aide on RIGHT only.  Last saw hearing doctor about 1 yr ago. Vision screen - failed R eye.  S/p bilat cataract surgery.  Last saw eye doctor >1 yr ago.  Denies depression, anhedonia, sadness No falls in last year.  No regular exercise 2/2 SOB and wheezing  Remote smoking hx.  Strong fmhx lung cancer.  Widower since 2000 - husband battled cancer for 6 yrs, died from MI Lives with daughter Alazia Schurig) Occupation: retired HS: 12th grade Activity: no regular exercise Diet: good water, fruits/vegetables daily  Preventative:  Colonoscopy - had normal colonoscopy per pt years ago.  Normal iFOB this year. Mammogram - 10/2012, normal. Tetanus - 2010 Flu - done 2013 Shingles shot - done per pt  Pneumovax - done  Advanced directives: discussed - would like packet of info on this.  Medications and allergies reviewed and updated in chart.  Past histories reviewed and updated if relevant as below. Patient Active Problem List  Diagnosis  . Hypothyroid  . Hyperlipidemia  . Hypertension  . Arthritis  . Depression  . History of ulcer disease  . Left hand paresthesia  . Trigger finger of right hand  . Dyspnea  . Anemia   Past Medical History  Diagnosis Date  . Arthritis   . History of chicken pox   . Depression   . History of ulcer disease 1990s  . HTN (hypertension)   . Hypothyroidism   . HLD (hyperlipidemia)   . Anemia    Past Surgical History  Procedure Date  . Cataract extraction 2012    bilateral  . Gallbladder surgery 1974  . Appendectomy 1974  . Tonsillectomy 1945  . Back surgery 1979  . Knee surgery 1987    right  . Knee surgery 1998    left  . Knee surgery 2008  . US echocardiography 2010    LVEF 65-75%, overall normal, mildly decreased LV diastolic compliance   History  Substance Use  Topics  . Smoking status: Former Smoker    Quit date: 12/30/1992  . Smokeless tobacco: Never Used  . Alcohol Use: No   Family History  Problem Relation Age of Onset  . Cancer Mother 57    lung, nonsmoker  . Cancer Father     lung, nonsmoker  . Cancer Brother 2    lung, smoker  . Cancer Sister 93    brain  . Cancer Sister 55    lung, smoker  . COPD Sister     smoker  . Cancer Sister 6    colon  . Hypertension Daughter   . CAD Neg Hx   . Stroke Neg Hx   . Diabetes Daughter    No Known Allergies Current Outpatient Prescriptions on File Prior to Visit  Medication Sig Dispense Refill  . acetaminophen (TYLENOL) 500 MG tablet Take 1,000 mg by mouth daily as needed.       . Calcium Carbonate-Vitamin D (CALCIUM 600+D) 600-400 MG-UNIT per tablet Take 1 tablet by mouth 2 (two) times daily.      . cholecalciferol (VITAMIN D) 1000 UNITS tablet Take 1,000 Units by mouth daily.      Marland Kitchen diltiazem (TIAZAC) 240 MG 24 hr capsule Take 240 mg by mouth daily.      . diphenhydramine-acetaminophen (TYLENOL PM) 25-500 MG TABS  Take 2 tablets by mouth at bedtime as needed.      Marland Kitchen FLUoxetine (PROZAC) 20 MG capsule Take 20 mg by mouth daily.      Marland Kitchen levothyroxine (SYNTHROID, LEVOTHROID) 175 MCG tablet Take 175 mcg by mouth daily. 1/2 tablet daily      . lisinopril (PRINIVIL,ZESTRIL) 40 MG tablet Take 40 mg by mouth 2 (two) times daily.       . ranitidine (ZANTAC) 150 MG tablet Take 150 mg by mouth at bedtime.       . simvastatin (ZOCOR) 20 MG tablet Take 20 mg by mouth at bedtime.          Review of Systems  Constitutional: Negative for fever, chills, activity change, appetite change, fatigue and unexpected weight change.  HENT: Negative for hearing loss, rhinorrhea and neck pain.   Eyes: Negative for visual disturbance.  Respiratory: Positive for cough, shortness of breath and wheezing. Negative for chest tightness.   Cardiovascular: Positive for leg swelling (wears support hose). Negative for  chest pain and palpitations.  Gastrointestinal: Negative for nausea, vomiting, abdominal pain, diarrhea, constipation, blood in stool and abdominal distention.  Genitourinary: Negative for hematuria and difficulty urinating.  Musculoskeletal: Negative for myalgias and arthralgias.  Skin: Negative for rash.  Neurological: Positive for numbness (left hand). Negative for dizziness, seizures, syncope and headaches.  Hematological: Does not bruise/bleed easily.  Psychiatric/Behavioral: Negative for dysphoric mood. The patient is not nervous/anxious.        Objective:   Physical Exam  Nursing note and vitals reviewed. Constitutional: She is oriented to person, place, and time. She appears well-developed and well-nourished. No distress.       obese  HENT:  Head: Normocephalic and atraumatic.  Right Ear: Tympanic membrane, external ear and ear canal normal. Decreased hearing is noted.  Left Ear: Tympanic membrane, external ear and ear canal normal. Decreased hearing is noted.  Nose: Nose normal.  Mouth/Throat: Oropharynx is clear and moist.  Eyes: Conjunctivae normal and EOM are normal. Pupils are equal, round, and reactive to light.  Neck: Normal range of motion. Neck supple. Carotid bruit is not present. No thyromegaly present.  Cardiovascular: Normal rate, regular rhythm, normal heart sounds and intact distal pulses.   No murmur heard. Pulses:      Radial pulses are 2+ on the right side, and 2+ on the left side.  Pulmonary/Chest: Effort normal and breath sounds normal. No respiratory distress. She has no wheezes. She has no rales.  Abdominal: Soft. Bowel sounds are normal. She exhibits no distension and no mass. There is no tenderness. There is no rebound and no guarding.  Musculoskeletal: Normal range of motion. She exhibits edema (nonpitting).  Lymphadenopathy:    She has no cervical adenopathy.  Neurological: She is alert and oriented to person, place, and time.       CN grossly  intact, station and gait intact  Skin: Skin is warm and dry. No rash noted.  Psychiatric: She has a normal mood and affect. Her behavior is normal. Judgment and thought content normal.       Assessment & Plan:

## 2012-12-17 NOTE — Assessment & Plan Note (Signed)
Chronic, stable. On levothyroxine 1106mcg daily. Lab Results  Component Value Date   TSH 1.91 10/27/2012

## 2012-12-17 NOTE — Assessment & Plan Note (Addendum)
Check CXR today for h/o DOE and strong fmhx lung cancer, remote smoking hx. CXR - overall clear.

## 2013-01-17 ENCOUNTER — Encounter: Payer: Self-pay | Admitting: Family Medicine

## 2013-01-19 ENCOUNTER — Other Ambulatory Visit: Payer: Self-pay | Admitting: *Deleted

## 2013-01-19 MED ORDER — SIMVASTATIN 20 MG PO TABS: 20.0000 mg | ORAL_TABLET | Freq: Every day | ORAL | Status: DC

## 2013-01-19 MED ORDER — LEVOTHYROXINE SODIUM 175 MCG PO TABS: ORAL_TABLET | ORAL | Status: DC

## 2013-01-19 MED ORDER — DILTIAZEM HCL ER BEADS 240 MG PO CP24: 240.0000 mg | ORAL_CAPSULE | Freq: Every day | ORAL | Status: DC

## 2013-05-28 ENCOUNTER — Other Ambulatory Visit: Payer: Self-pay | Admitting: *Deleted

## 2013-05-28 MED ORDER — LISINOPRIL 40 MG PO TABS: 40.0000 mg | ORAL_TABLET | Freq: Two times a day (BID) | ORAL | Status: DC

## 2013-05-28 MED ORDER — FLUOXETINE HCL 20 MG PO CAPS: 20.0000 mg | ORAL_CAPSULE | Freq: Every day | ORAL | Status: DC

## 2013-09-06 ENCOUNTER — Other Ambulatory Visit: Payer: Self-pay | Admitting: *Deleted

## 2013-09-06 MED ORDER — FLUOXETINE HCL 20 MG PO CAPS: 20.0000 mg | ORAL_CAPSULE | Freq: Every day | ORAL | Status: DC

## 2013-10-26 ENCOUNTER — Other Ambulatory Visit: Payer: Self-pay | Admitting: *Deleted

## 2013-10-26 MED ORDER — DILTIAZEM HCL ER BEADS 240 MG PO CP24: 240.0000 mg | ORAL_CAPSULE | Freq: Every day | ORAL | Status: DC

## 2013-10-26 MED ORDER — SIMVASTATIN 20 MG PO TABS: 20.0000 mg | ORAL_TABLET | Freq: Every day | ORAL | Status: DC

## 2013-12-06 ENCOUNTER — Other Ambulatory Visit: Payer: Self-pay | Admitting: *Deleted

## 2013-12-06 MED ORDER — FLUOXETINE HCL 20 MG PO CAPS: 20.0000 mg | ORAL_CAPSULE | Freq: Every day | ORAL | Status: DC

## 2013-12-06 MED ORDER — LISINOPRIL 40 MG PO TABS: 40.0000 mg | ORAL_TABLET | Freq: Two times a day (BID) | ORAL | Status: DC

## 2013-12-10 ENCOUNTER — Encounter: Payer: Self-pay | Admitting: Family Medicine

## 2013-12-10 ENCOUNTER — Ambulatory Visit (INDEPENDENT_AMBULATORY_CARE_PROVIDER_SITE_OTHER): Payer: Medicare Other | Admitting: Family Medicine

## 2013-12-10 VITALS — BP 170/80 | HR 76 | Temp 97.8°F | Wt 242.5 lb

## 2013-12-10 DIAGNOSIS — R202 Paresthesia of skin: Secondary | ICD-10-CM

## 2013-12-10 DIAGNOSIS — Z87898 Personal history of other specified conditions: Secondary | ICD-10-CM

## 2013-12-10 DIAGNOSIS — E039 Hypothyroidism, unspecified: Secondary | ICD-10-CM

## 2013-12-10 DIAGNOSIS — I10 Essential (primary) hypertension: Secondary | ICD-10-CM

## 2013-12-10 DIAGNOSIS — R209 Unspecified disturbances of skin sensation: Secondary | ICD-10-CM

## 2013-12-10 DIAGNOSIS — E785 Hyperlipidemia, unspecified: Secondary | ICD-10-CM

## 2013-12-10 LAB — BASIC METABOLIC PANEL
BUN: 12 mg/dL (ref 6–23)
Calcium: 10.1 mg/dL (ref 8.4–10.5)
Chloride: 108 mEq/L (ref 96–112)
GFR: 53.39 mL/min — ABNORMAL LOW (ref >60.00)
Glucose, Bld: 82 mg/dL (ref 70–99)
Potassium: 3.8 mEq/L (ref 3.5–5.1)
Sodium: 141 mEq/L (ref 135–145)

## 2013-12-10 LAB — CBC WITH DIFFERENTIAL/PLATELET
Basophils Absolute: 0 10*3/uL (ref 0.0–0.1)
Basophils Relative: 0.6 % (ref 0.0–3.0)
Eosinophils Absolute: 0.2 10*3/uL (ref 0.0–0.7)
Hemoglobin: 12.9 g/dL (ref 12.0–15.0)
Lymphocytes Relative: 24.9 % (ref 12.0–46.0)
MCHC: 33.2 g/dL (ref 30.0–36.0)
Monocytes Relative: 9.8 % (ref 3.0–12.0)
Neutrophils Relative %: 60.4 % (ref 43.0–77.0)
RBC: 4.56 Mil/uL (ref 3.87–5.11)
RDW: 13.8 % (ref 11.5–14.6)

## 2013-12-10 LAB — LIPID PANEL
Cholesterol: 149 mg/dL (ref 0–200)
HDL: 51.5 mg/dL (ref >39.00)
LDL Cholesterol: 68 mg/dL (ref 0–99)
Total CHOL/HDL Ratio: 3
Triglycerides: 150 mg/dL — ABNORMAL HIGH (ref 0.0–149.0)
VLDL: 30 mg/dL (ref 0.0–40.0)

## 2013-12-10 LAB — TSH: TSH: 1.94 u[IU]/mL (ref 0.35–5.50)

## 2013-12-10 MED ORDER — LISINOPRIL 40 MG PO TABS: 40.0000 mg | ORAL_TABLET | Freq: Two times a day (BID) | ORAL | Status: DC

## 2013-12-10 MED ORDER — HYDROCHLOROTHIAZIDE 12.5 MG PO CAPS: 12.5000 mg | ORAL_CAPSULE | Freq: Every day | ORAL | Status: DC

## 2013-12-10 MED ORDER — FLUOXETINE HCL 20 MG PO CAPS: 20.0000 mg | ORAL_CAPSULE | Freq: Every day | ORAL | Status: DC

## 2013-12-10 MED ORDER — SIMVASTATIN 20 MG PO TABS: 20.0000 mg | ORAL_TABLET | Freq: Every day | ORAL | Status: DC

## 2013-12-10 MED ORDER — LEVOTHYROXINE SODIUM 175 MCG PO TABS: ORAL_TABLET | ORAL | Status: DC

## 2013-12-10 MED ORDER — DILTIAZEM HCL ER BEADS 240 MG PO CP24: 240.0000 mg | ORAL_CAPSULE | Freq: Every day | ORAL | Status: DC

## 2013-12-10 NOTE — Progress Notes (Signed)
   Subjective:    Patient ID: [name], female    DOB: 1930-03-09, 79 y.o.   MRN: [id number]  HPI CC: 1 year med refill visit  Forgot hearing aid she wears in R ear only. Very hard of hearing. Daughter Holley Raring has moved into nursing home.  HTN - No vision changes, CP/tightness, SOB.  Occasional leg swelling and headaches.  Reports compliance with meds but did not take lisinopril yet today. States at home runs [id number] systolic as well.    Hypothyroid - only takes 1/2 tablet of 110mcg daily.  HLD - compliant with simvastatin.  Occasional L hand numbness - has wrist brace at home.  Past Medical History  Diagnosis Date  . Arthritis   . History of chicken pox   . Depression   . History of ulcer disease 1990s  . HTN (hypertension)   . Hypothyroidism   . HLD (hyperlipidemia)   . History of anemia      Review of Systems Per HPI    Objective:   Physical Exam  Nursing note and vitals reviewed. Constitutional: She appears well-developed and well-nourished. No distress.  HENT:  Mouth/Throat: No oropharyngeal exudate.  Eyes: No scleral icterus.  Cardiovascular: Normal rate, regular rhythm, normal heart sounds and intact distal pulses.   No murmur heard. Pulmonary/Chest: Effort normal and breath sounds normal. No respiratory distress. She has no wheezes. She has no rales.  Musculoskeletal: She exhibits no edema.  Compression stockings in place       Assessment & Plan:

## 2013-12-10 NOTE — Assessment & Plan Note (Signed)
Advised to use wrist brace she has at home.

## 2013-12-10 NOTE — Assessment & Plan Note (Signed)
Chronic, uncontrolled. Start hctz. Check blood work today. Recheck in 3 months.

## 2013-12-10 NOTE — Assessment & Plan Note (Signed)
Chronic, stable. Pt states she's actually been taking 1/2 tablet daily and that is why she has so many pills. Check TSH today.

## 2013-12-10 NOTE — Progress Notes (Signed)
Pre-visit discussion using our clinic review tool. No additional management support is needed unless otherwise documented below in the visit note.  

## 2013-12-10 NOTE — Assessment & Plan Note (Signed)
Chronically on simvastatin. Check FLP today.

## 2013-12-10 NOTE — Patient Instructions (Addendum)
Good to see you today, call us with questions. I've sent in lisinopril and fluoxetine to your local pharmacy Blood work today. Your blood pressure is too high!  Start water pill called hydrochlorothiazide - I've sent this in to your pharmacy (1 month at [facility] and then 3 month supply to mail order). Start over the counter claritin or zyrtec for runny nose. I'd like you to return in 3 months for medicare wellness visit

## 2013-12-15 ENCOUNTER — Encounter: Payer: Self-pay | Admitting: *Deleted

## 2014-02-16 ENCOUNTER — Telehealth: Payer: Self-pay | Admitting: Family Medicine

## 2014-02-16 NOTE — Telephone Encounter (Signed)
In your IN box for completion.  

## 2014-02-16 NOTE — Telephone Encounter (Signed)
Pt dropped off forms for disability parking tag and a statement from primary care physician to be completed.  I have placed these forms on your desk to pass on to Dr. Danise Mina. Thank you.

## 2014-02-17 NOTE — Telephone Encounter (Signed)
Spoke with patient and she said it was very difficult for her to walk without having to stop and rest. Form completed and placed up front for pick up.

## 2014-02-17 NOTE — Telephone Encounter (Signed)
plz check with patient - what is reason for disability for handicap placard? May review list with patient.  Placed in Kim's box.

## 2014-02-23 ENCOUNTER — Encounter: Payer: Self-pay | Admitting: *Deleted

## 2014-02-23 ENCOUNTER — Telehealth: Payer: Self-pay

## 2014-02-23 NOTE — Telephone Encounter (Signed)
Letter faxed and patient's daughter notified.

## 2014-02-23 NOTE — Telephone Encounter (Signed)
pts daughter ,[name] request letter faxed to [employer] in [place] (fax # [phone]) stating that pt is capable of taking care of self and pt is aware of when to take her medications on her own. [name] request cb when letter faxed.

## 2014-02-23 NOTE — Telephone Encounter (Signed)
Ok to do this - stating "pt has ability to self administer meds."

## 2014-03-03 ENCOUNTER — Other Ambulatory Visit (INDEPENDENT_AMBULATORY_CARE_PROVIDER_SITE_OTHER): Payer: Medicare Other

## 2014-03-03 ENCOUNTER — Other Ambulatory Visit: Payer: Self-pay | Admitting: Family Medicine

## 2014-03-03 DIAGNOSIS — I10 Essential (primary) hypertension: Secondary | ICD-10-CM

## 2014-03-03 LAB — RENAL FUNCTION PANEL
ALBUMIN: 3.5 g/dL (ref 3.5–5.2)
BUN: 15 mg/dL (ref 6–23)
CALCIUM: 10.1 mg/dL (ref 8.4–10.5)
CO2: 28 mEq/L (ref 19–32)
CREATININE: 1.1 mg/dL (ref 0.4–1.2)
Chloride: 105 mEq/L (ref 96–112)
GFR: 50.05 mL/min — ABNORMAL LOW (ref >60.00)
GLUCOSE: 80 mg/dL (ref 70–99)
Phosphorus: 2.7 mg/dL (ref 2.3–4.6)
Potassium: 3.7 mEq/L (ref 3.5–5.1)
Sodium: 140 mEq/L (ref 135–145)

## 2014-03-04 ENCOUNTER — Telehealth: Payer: Self-pay | Admitting: Family Medicine

## 2014-03-04 NOTE — Telephone Encounter (Signed)
emmi report mailed to patient ° °

## 2014-03-10 ENCOUNTER — Encounter: Payer: Self-pay | Admitting: Family Medicine

## 2014-03-10 ENCOUNTER — Ambulatory Visit (INDEPENDENT_AMBULATORY_CARE_PROVIDER_SITE_OTHER): Payer: Medicare Other | Admitting: Family Medicine

## 2014-03-10 VITALS — BP 130/70 | HR 76 | Temp 98.1°F | Ht 64.0 in | Wt 238.8 lb

## 2014-03-10 DIAGNOSIS — E785 Hyperlipidemia, unspecified: Secondary | ICD-10-CM

## 2014-03-10 DIAGNOSIS — I10 Essential (primary) hypertension: Secondary | ICD-10-CM

## 2014-03-10 DIAGNOSIS — N184 Chronic kidney disease, stage 4 (severe): Secondary | ICD-10-CM | POA: Insufficient documentation

## 2014-03-10 DIAGNOSIS — F3289 Other specified depressive episodes: Secondary | ICD-10-CM

## 2014-03-10 DIAGNOSIS — Z0000 Encounter for general adult medical examination without abnormal findings: Secondary | ICD-10-CM

## 2014-03-10 DIAGNOSIS — N183 Chronic kidney disease, stage 3 unspecified: Secondary | ICD-10-CM | POA: Insufficient documentation

## 2014-03-10 DIAGNOSIS — F329 Major depressive disorder, single episode, unspecified: Secondary | ICD-10-CM

## 2014-03-10 DIAGNOSIS — F32A Depression, unspecified: Secondary | ICD-10-CM

## 2014-03-10 DIAGNOSIS — Z23 Encounter for immunization: Secondary | ICD-10-CM

## 2014-03-10 DIAGNOSIS — E039 Hypothyroidism, unspecified: Secondary | ICD-10-CM

## 2014-03-10 MED ORDER — FLUOXETINE HCL 10 MG PO CAPS: 10.0000 mg | ORAL_CAPSULE | Freq: Every day | ORAL | Status: DC

## 2014-03-10 NOTE — Patient Instructions (Signed)
Prevnar today. Let's try lower dose of prozac - I've sent in 10mg  [facility]. Bring me copy when you complete your advanced directive. Kidneys slightly bumped - we will watch this for now.  Ensure you're staying well hydrated. Good to see you today, call us with questions.

## 2014-03-10 NOTE — Assessment & Plan Note (Signed)
Will try to taper - take 10mg  prozac daily.  Update me on effect on mood

## 2014-03-10 NOTE — Progress Notes (Signed)
Pre visit review using our clinic review tool, if applicable. No additional management support is needed unless otherwise documented below in the visit note. 

## 2014-03-10 NOTE — Addendum Note (Signed)
Addended by: Royann Shivers A on: 03/10/2014 12:40 PM   Modules accepted: Orders

## 2014-03-10 NOTE — Assessment & Plan Note (Addendum)
I have personally reviewed the Medicare Annual Wellness questionnaire and have noted 1. The patient's medical and social history 2. Their use of alcohol, tobacco or illicit drugs 3. Their current medications and supplements 4. The patient's functional ability including ADL's, fall risks, home safety risks and hearing or visual impairment. 5. Diet and physical activity 6. Evidence for depression or mood disorders The patients weight, height, BMI have been recorded in the chart.  Hearing and vision has been addressed. I have made referrals, counseling and provided education to the patient based review of the above and I have provided the pt with a written personalized care plan for preventive services. See scanned questionairre. Advanced directives discussed: pt to fill out when she moves into retirement community.  Reviewed preventative protocols and updated unless pt declined.

## 2014-03-10 NOTE — Assessment & Plan Note (Addendum)
Slow deterioration noted. Discussed importance of good hydration status. Continue to monitor. Avoid NSAIDs.

## 2014-03-10 NOTE — Assessment & Plan Note (Signed)
Chronic, stable. Continue med. 

## 2014-03-10 NOTE — Progress Notes (Signed)
BP 130/70  Pulse 76  Temp(Src) 98.1 F (36.7 C) (Oral)  Ht 5\' 4"  (1.626 m)  Wt 238 lb 12.8 oz (108.319 kg)  BMI 40.97 kg/m2   CC: medicare wellness visit  Subjective:    Patient ID: [name], female    DOB: 04/14/1930, 80 y.o.   MRN: [id number]  HPI: [name] is a 80 y.o. female presenting on 03/10/2014 with Annual Exam   Presents with daughter.  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  Planning on moving to senior retirement [place]). No regular exercise 2/2 SOB and wheezing Remote smoking hx. Strong fmhx lung cancer.   Hearing screen - wears hearing aide on RIGHT only.  Vision screen - failed R eye.  S/p bilat cataract surgery.  Due for eye exam ([facility]) Denies depression, anhedonia, sadness.  On prozac 20mg  for last 10+ years. No falls in last year.   Widower since 2000 - husband battled cancer for 6 yrs, died from [place] with daughter [name]) planning on moving into retirement home. Occupation: retired  HS: 12th grade  Activity: no regular exercise  Diet: good water, fruits/vegetables daily   Preventative:  Colonoscopy - had normal colonoscopy per pt years ago. Normal iFOB 2013.  Will age out. Mammogram - 10/2012, normal. Will age out. Tetanus - 2010  Flu - 10/2013 Shingles shot - 12/2011 Pneumovax - 09/2012.  Prevnar - today. Advanced directives: planning on setting up when arrives to new retirement home.  Will bring me copy.   Relevant past medical, surgical, family and social history reviewed and updated as indicated.  Allergies and medications reviewed and updated. Current Outpatient Prescriptions on File Prior to Visit  Medication Sig  . acetaminophen (TYLENOL) 500 MG tablet Take 1,000 mg by mouth daily as needed.   . Calcium Carbonate-Vitamin D (CALCIUM 600+D) 600-400 MG-UNIT per tablet Take 1 tablet by mouth 2 (two) times daily.  . cholecalciferol (VITAMIN D) 1000 UNITS tablet Take 1,000 Units by mouth daily.  Marland Kitchen diltiazem (TIAZAC) 240  MG 24 hr capsule Take 1 capsule (240 mg total) by mouth daily.  . diphenhydramine-acetaminophen (TYLENOL PM) 25-500 MG TABS Take 2 tablets by mouth at bedtime as needed.  . hydrochlorothiazide (MICROZIDE) 12.5 MG capsule Take 1 capsule (12.5 mg total) by mouth daily.  Marland Kitchen lisinopril (PRINIVIL,ZESTRIL) 40 MG tablet Take 1 tablet (40 mg total) by mouth 2 (two) times daily.  Marland Kitchen loratadine (CLARITIN) 10 MG tablet Take 10 mg by mouth daily.  . ranitidine (ZANTAC) 150 MG tablet Take 150 mg by mouth at bedtime.   . simvastatin (ZOCOR) 20 MG tablet Take 1 tablet (20 mg total) by mouth at bedtime.  . vitamin B-12 (CYANOCOBALAMIN) 500 MCG tablet Take 1,000 mcg by mouth daily.    No current facility-administered medications on file prior to visit.    Review of Systems  Constitutional: Negative for fever, chills, activity change, appetite change, fatigue and unexpected weight change.  HENT: Negative for hearing loss.   Eyes: Negative for visual disturbance.  Respiratory: Negative for cough, chest tightness, shortness of breath and wheezing.   Cardiovascular: Negative for chest pain, palpitations and leg swelling.  Gastrointestinal: Negative for nausea, vomiting, abdominal pain, diarrhea, constipation, blood in stool and abdominal distention.  Genitourinary: Negative for hematuria and difficulty urinating.  Musculoskeletal: Negative for arthralgias, myalgias and neck pain.  Skin: Negative for rash.  Neurological: Negative for dizziness, seizures, syncope and headaches.  Hematological: Negative for adenopathy. Does not bruise/bleed  easily.  Psychiatric/Behavioral: Negative for dysphoric mood. The patient is not nervous/anxious.    Per HPI unless specifically indicated above    Objective:    BP 130/70  Pulse 76  Temp(Src) 98.1 F (36.7 C) (Oral)  Ht 5\' 4"  (1.626 m)  Wt 238 lb 12.8 oz (108.319 kg)  BMI 40.97 kg/m2  Physical Exam  Nursing note and vitals reviewed. Constitutional: She is oriented  to person, place, and time. She appears well-developed and well-nourished. No distress.  HENT:  Head: Normocephalic and atraumatic.  Right Ear: Hearing, tympanic membrane, external ear and ear canal normal.  Left Ear: Hearing, tympanic membrane, external ear and ear canal normal.  Nose: Nose normal.  Mouth/Throat: Uvula is midline, oropharynx is clear and moist and mucous membranes are normal. No oropharyngeal exudate, posterior oropharyngeal edema or posterior oropharyngeal erythema.  Eyes: Conjunctivae and EOM are normal. Pupils are equal, round, and reactive to light. No scleral icterus.  Neck: Normal range of motion. Neck supple. Carotid bruit is not present. No thyromegaly present.  Cardiovascular: Normal rate, regular rhythm, normal heart sounds and intact distal pulses.   No murmur heard. Pulses:      Radial pulses are 2+ on the right side, and 2+ on the left side.  Pulmonary/Chest: Effort normal and breath sounds normal. No respiratory distress. She has no wheezes. She has no rales.  Breast exam: declines  Abdominal: Soft. Bowel sounds are normal. She exhibits no distension and no mass. There is no tenderness. There is no rebound and no guarding.  Genitourinary:  deferred  Musculoskeletal: Normal range of motion. She exhibits no edema.  Lymphadenopathy:    She has no cervical adenopathy.  Neurological: She is alert and oriented to person, place, and time.  CN grossly intact, station and gait intact 3/3 recall 5/5 calculation serial 7s  Skin: Skin is warm and dry. No rash noted.  Psychiatric: She has a normal mood and affect. Her behavior is normal. Judgment and thought content normal.   Results for orders placed in visit on 03/03/14  RENAL FUNCTION PANEL      Result Value Ref Range   Sodium 140  135 - 145 mEq/L   Potassium 3.7  3.5 - 5.1 mEq/L   Chloride 105  96 - 112 mEq/L   CO2 28  19 - 32 mEq/L   Calcium 10.1  8.4 - 10.5 mg/dL   Albumin 3.5  3.5 - 5.2 g/dL   BUN 15  6  - 23 mg/dL   Creatinine, Ser 1.1  0.4 - 1.2 mg/dL   Glucose, Bld 80  70 - 99 mg/dL   Phosphorus 2.7  2.3 - 4.6 mg/dL   GFR 50.05 (*) >60.00 mL/min      Assessment & Plan:   Problem List Items Addressed This Visit   CKD (chronic kidney disease) stage 3, GFR 30-59 ml/min     Slow deterioration noted. Discussed importance of good hydration status. Continue to monitor. Avoid NSAIDs.    Depression     Will try to taper - take 10mg  prozac daily.  Update me on effect on mood    Relevant Medications      FLUoxetine (PROZAC) capsule   Hyperlipidemia     Chronic, stable. Continue med.    Hypertension     Chronic, stable continue meds.    Hypothyroidism     Chronic, stable. Continue med.  Pt takes 1/2 tab daily.    Relevant Medications      levothyroxine (SYNTHROID,  LEVOTHROID) 175 MCG tablet   Medicare annual wellness visit, subsequent - Primary     I have personally reviewed the Medicare Annual Wellness questionnaire and have noted 1. The patient's medical and social history 2. Their use of alcohol, tobacco or illicit drugs 3. Their current medications and supplements 4. The patient's functional ability including ADL's, fall risks, home safety risks and hearing or visual impairment. 5. Diet and physical activity 6. Evidence for depression or mood disorders The patients weight, height, BMI have been recorded in the chart.  Hearing and vision has been addressed. I have made referrals, counseling and provided education to the patient based review of the above and I have provided the pt with a written personalized care plan for preventive services. See scanned questionairre. Advanced directives discussed: pt to fill out when she moves into retirement community.  Reviewed preventative protocols and updated unless pt declined.         Follow up plan: Return as needed, for annual exam, prior fasting for blood work.

## 2014-03-10 NOTE — Assessment & Plan Note (Addendum)
Chronic, stable. Continue med.  Pt takes 1/2 tab daily.

## 2014-03-10 NOTE — Assessment & Plan Note (Signed)
Chronic, stable. continue meds. 

## 2014-03-11 ENCOUNTER — Telehealth: Payer: Self-pay | Admitting: Family Medicine

## 2014-03-11 NOTE — Telephone Encounter (Signed)
Relevant patient education mailed to patient.  

## 2014-06-10 ENCOUNTER — Encounter: Payer: Self-pay | Admitting: Family Medicine

## 2014-06-10 ENCOUNTER — Ambulatory Visit (INDEPENDENT_AMBULATORY_CARE_PROVIDER_SITE_OTHER): Payer: Medicare Other | Admitting: Family Medicine

## 2014-06-10 VITALS — BP 164/88 | HR 87 | Temp 98.1°F | Wt 238.8 lb

## 2014-06-10 DIAGNOSIS — IMO0001 Reserved for inherently not codable concepts without codable children: Secondary | ICD-10-CM

## 2014-06-10 MED ORDER — SIMVASTATIN 20 MG PO TABS: 20.0000 mg | ORAL_TABLET | Freq: Every day | ORAL | Status: DC

## 2014-06-10 NOTE — Assessment & Plan Note (Signed)
Bilateral, no trauma, likely from statin. Hold statin for now and report back next week.  Routed to PCP as FYI.  D/w pt and family, they agree.

## 2014-06-10 NOTE — Progress Notes (Signed)
Pre visit review using our clinic review tool, if applicable. No additional management support is needed unless otherwise documented below in the visit note.  B leg pain, posterior.  Going on since 02/2014.  Worse recently.  Taking tylenol for pain, helps for a few hours.  No falls, no trauma.  She isn't going up a lot a stairs to cause this.  No bruising.  No other aches.  Muscle aches, not a radicular pain.  No bowel or bladder changes.  Aspercreme helps a little.  Pain walking.  Can have pain at rest, it no recent tylenol.  Pain isn't in the knees or hips.   She has some occ L hand numbness.  She has a h/o L hand/finger fx, years ago.    On simvastatin.    Meds, vitals, and allergies reviewed.   ROS: See HPI.  Otherwise, noncontributory.  Nad ncat Mmm rrr ctab abd soft, not ttp Ext with edema controlled by compression stockings.  Calf and thighs not ttp

## 2014-06-10 NOTE — Patient Instructions (Signed)
Stop the simvastatin for now and update Dr. Darnell Level next week.  I think that medicine is causing the aches.  Take care.

## 2014-06-17 ENCOUNTER — Other Ambulatory Visit: Payer: Self-pay | Admitting: *Deleted

## 2014-06-17 MED ORDER — LISINOPRIL 40 MG PO TABS: 40.0000 mg | ORAL_TABLET | Freq: Two times a day (BID) | ORAL | Status: DC

## 2014-06-20 ENCOUNTER — Telehealth: Payer: Self-pay | Admitting: Family Medicine

## 2014-06-20 ENCOUNTER — Encounter: Payer: Self-pay | Admitting: Family Medicine

## 2014-06-20 ENCOUNTER — Other Ambulatory Visit: Payer: Self-pay | Admitting: *Deleted

## 2014-06-20 ENCOUNTER — Other Ambulatory Visit: Payer: Self-pay

## 2014-06-20 ENCOUNTER — Ambulatory Visit (INDEPENDENT_AMBULATORY_CARE_PROVIDER_SITE_OTHER): Payer: Medicare Other | Admitting: Family Medicine

## 2014-06-20 VITALS — BP 138/80 | HR 70 | Temp 98.0°F | Ht 64.0 in | Wt 243.5 lb

## 2014-06-20 DIAGNOSIS — R1310 Dysphagia, unspecified: Secondary | ICD-10-CM

## 2014-06-20 DIAGNOSIS — J329 Chronic sinusitis, unspecified: Secondary | ICD-10-CM

## 2014-06-20 DIAGNOSIS — K0889 Other specified disorders of teeth and supporting structures: Secondary | ICD-10-CM

## 2014-06-20 DIAGNOSIS — R6884 Jaw pain: Secondary | ICD-10-CM

## 2014-06-20 DIAGNOSIS — Z972 Presence of dental prosthetic device (complete) (partial): Secondary | ICD-10-CM

## 2014-06-20 MED ORDER — LISINOPRIL 40 MG PO TABS: 40.0000 mg | ORAL_TABLET | Freq: Two times a day (BID) | ORAL | Status: DC

## 2014-06-20 NOTE — Telephone Encounter (Signed)
Patient Information:  Caller Name: [name]  Phone: [phone]  Patient: [name], [name]  Gender: Female  DOB: 09-28-1930  Age: 80 Years  PCP: [name] [facility])  Office Follow Up:  Does the office need to follow up with this patient?: No  Instructions For The Office: N/A   Symptoms  Reason For Call & Symptoms: Daughter is calling to report right ear pain; no fever; appt made for tomorrow at 4:00pm; pt is not able to sleep well due to the pain  Reviewed Health History In EMR: Yes  Reviewed Medications In EMR: Yes  Reviewed Allergies In EMR: Yes  Reviewed Surgeries / Procedures: Yes  Date of Onset of Symptoms: 06/18/2014  Treatments Tried: Tylenol and Warm compresses  Treatments Tried Worked: No  Guideline(s) Used:  Earache  Disposition Per Guideline:   Go to Office Now  Reason For Disposition Reached:   Severe earache pain  Advice Given:  Pain Medicines:  For pain relief, you can take either acetaminophen, ibuprofen, or naproxen.  Pain Medicines:  For pain relief, you can take either acetaminophen, ibuprofen, or naproxen.  Acetaminophen (e.g., Tylenol):  Regular Strength Tylenol: Take 650 mg (two 325 mg pills) by mouth every 4-6 hours as needed. Each Regular Strength Tylenol pill has 325 mg of acetaminophen.  Call Back If  You become worse.  Patient Will Follow Care Advice:  YES  Appointment Scheduled:  06/20/2014 14:00:00 Appointment Scheduled Provider:  Other  Appt made with Dr Maudie Mercury at [facility]

## 2014-06-20 NOTE — Telephone Encounter (Signed)
primemail left v/m requesting increasing lisinopril quantity to # 180 which would be 90 day supply. Done.

## 2014-06-20 NOTE — Patient Instructions (Signed)
-  nasocort for 1 month  -tylenol as instructed as needed and heat as needed  -follow up with Dr. Danise Mina in about 1 month

## 2014-06-20 NOTE — Progress Notes (Signed)
Pre visit review using our clinic review tool, if applicable. No additional management support is needed unless otherwise documented below in the visit note. 

## 2014-06-20 NOTE — Telephone Encounter (Signed)
Noted. Appreciate Dr. Maudie Mercury seeing patient.

## 2014-06-20 NOTE — Progress Notes (Signed)
No chief complaint on file.   HPI:  Acute visit for:  1) Earache: -pcp unavailable -reports: R ear pain for 1 day, with opening jaw, intermittent, some nasal congestion chronically with PND, occ sneezing -denies: fevers, HA, eye pain, hearing loss, popping or catching of jaw -has poorly fitting dentures and daughter is working on trying to get her new dentures  ROS: See pertinent positives and negatives per HPI.  Past Medical History  Diagnosis Date  . Arthritis   . History of chicken pox   . Depression   . History of ulcer disease 1990s  . HTN (hypertension)   . Hypothyroidism   . HLD (hyperlipidemia)   . History of anemia     Past Surgical History  Procedure Laterality Date  . Cataract extraction Bilateral 2012  . Gallbladder surgery  1974  . Appendectomy  1974  . Tonsillectomy  1945  . Back surgery  1979  . Knee surgery  1987    right  . Knee surgery  1998    left  . Knee surgery  2008  . US echocardiography  2010    LVEF 65-75%, overall normal, mildly decreased LV diastolic compliance  . Cardiovascular stress test  2010    WNL    Family History  Problem Relation Age of Onset  . Cancer Mother 1    lung, nonsmoker  . Cancer Father     lung, nonsmoker  . Cancer Brother 86    lung, smoker  . Cancer Sister 39    brain  . Cancer Sister 22    lung, smoker  . COPD Sister     smoker  . Cancer Sister 18    colon  . Hypertension Daughter   . CAD Neg Hx   . Stroke Neg Hx   . Diabetes Daughter     History   Social History  . Marital Status: Widowed    Spouse Name: N/A    Number of Children: N/A  . Years of Education: N/A   Social History Main Topics  . Smoking status: Former Smoker    Quit date: 12/30/1992  . Smokeless tobacco: Never Used  . Alcohol Use: No  . Drug Use: No  . Sexual Activity: None   Other Topics Concern  . None   Social History Narrative   Widower since 2000 - husband battled cancer for 6 yrs, died from [place]   Lives with  daughter Michea Crees)   Occupation: retired   HS: 12th grade   Activity: no regular exercise   Diet: good water, fruits/vegetables daily    Current outpatient prescriptions:acetaminophen (TYLENOL) 500 MG tablet, Take 1,000 mg by mouth daily as needed. , Disp: , Rfl: ;  Calcium Carbonate-Vitamin D (CALCIUM 600+D) 600-400 MG-UNIT per tablet, Take 1 tablet by mouth 2 (two) times daily., Disp: , Rfl: ;  cholecalciferol (VITAMIN D) 1000 UNITS tablet, Take 1,000 Units by mouth daily., Disp: , Rfl:  diltiazem (TIAZAC) 240 MG 24 hr capsule, Take 1 capsule (240 mg total) by mouth daily., Disp: 90 capsule, Rfl: 3;  diphenhydramine-acetaminophen (TYLENOL PM) 25-500 MG TABS, Take 2 tablets by mouth at bedtime as needed., Disp: , Rfl: ;  FLUoxetine (PROZAC) 10 MG capsule, Take 1 capsule (10 mg total) by mouth daily., Disp: 90 capsule, Rfl: 3 hydrochlorothiazide (MICROZIDE) 12.5 MG capsule, Take 1 capsule (12.5 mg total) by mouth daily., Disp: 90 capsule, Rfl: 3;  levothyroxine (SYNTHROID, LEVOTHROID) 175 MCG tablet, Take 1/2 tablet by mouth daily, Disp: ,  Rfl: ;  lisinopril (PRINIVIL,ZESTRIL) 40 MG tablet, Take 1 tablet (40 mg total) by mouth 2 (two) times daily., Disp: 180 tablet, Rfl: 1;  loratadine (CLARITIN) 10 MG tablet, Take 10 mg by mouth daily., Disp: , Rfl:  ranitidine (ZANTAC) 150 MG tablet, Take 150 mg by mouth at bedtime. , Disp: , Rfl: ;  vitamin B-12 (CYANOCOBALAMIN) 500 MCG tablet, Take 1,000 mcg by mouth daily. , Disp: , Rfl:   EXAM:  Filed Vitals:   06/20/14 1335  BP: 138/80  Pulse: 70  Temp: 98 F (36.7 C)    Body mass index is 41.78 kg/(m^2).  GENERAL: vitals reviewed and listed above, alert, oriented, appears well hydrated and in no acute distress  HEENT: atraumatic, conjunttiva clear, no obvious abnormalities on inspection of external nose and ears, mild TTP in R jaw muscles, no TMJ crepitus, but mildly assymetric opening of mouth, temp art TTP or bruit, normal appearance of ear  canals and TMs, clear nasal congestion, mild post oropharyngeal erythema with PND, no tonsillar edema or exudate, no sinus TTP  NECK: no obvious masses on inspection  MS: moves all extremities without noticeable abnormality  PSYCH: pleasant and cooperative, no obvious depression or anxiety  ASSESSMENT AND PLAN:  Discussed the following assessment and plan:  Jaw pain  Rhinosinusitis  Poorly fitting dentures  -we discussed possible serious and likely etiologies, workup and treatment, treatment risks and return precautions - query eustachian tube dysfunction versus jaw strain most likely -after this discussion, Kendria opted for nasocort, heat, tylenol -follow up advised in 1 month -of course, we advised Lacole  to return or notify a doctor immediately if symptoms worsen or persist or new concerns arise.  -Patient advised to return or notify a doctor immediately if symptoms worsen or persist or new concerns arise.  Patient Instructions  -nasocort for 1 month  -tylenol as instructed as needed and heat as needed  -follow up with Dr. Danise Mina in about 1 month     KIM, Nickola Major.

## 2014-06-21 ENCOUNTER — Ambulatory Visit: Payer: Medicare Other | Admitting: Family Medicine

## 2014-07-18 ENCOUNTER — Encounter: Payer: Self-pay | Admitting: Family Medicine

## 2014-07-18 ENCOUNTER — Ambulatory Visit (INDEPENDENT_AMBULATORY_CARE_PROVIDER_SITE_OTHER): Payer: Medicare Other | Admitting: Family Medicine

## 2014-07-18 VITALS — BP 130/70 | HR 60 | Temp 98.2°F | Wt 241.0 lb

## 2014-07-18 DIAGNOSIS — R252 Cramp and spasm: Secondary | ICD-10-CM | POA: Insufficient documentation

## 2014-07-18 DIAGNOSIS — E785 Hyperlipidemia, unspecified: Secondary | ICD-10-CM

## 2014-07-18 NOTE — Patient Instructions (Signed)
Don't take more than 6 tylenol 500mg  tablets spread throughout the day. Return tomorrow for fasting labwork. Good to see you today, call us with questions.

## 2014-07-18 NOTE — Progress Notes (Signed)
Pre visit review using our clinic review tool, if applicable. No additional management support is needed unless otherwise documented below in the visit note. 

## 2014-07-18 NOTE — Progress Notes (Signed)
BP 130/70  Pulse 60  Temp(Src) 98.2 F (36.8 C) (Oral)  Wt 241 lb (109.317 kg)   CC: 1 mo f/u  Subjective:    Patient ID: [name], female    DOB: Jul 08, 1930, 80 y.o.   MRN: [id number]  HPI: VANESSAMARIE Wu is a 80 y.o. female presenting on 07/18/2014 for Follow-up   Notes reviewed. Seen last month by Dr. Maudie Mercury with dx R jaw pain- thought rhinosinusitis vs jaw strain, treated with nasocort and tylenol/heat. Didn't end up using nasocort. She ended up changing dentures and jaw/ear pain resolved.   Leg pains bilaterally posterior thighs. Has been using aspercream at night time. Taking tylenol 1000mg  3-4 times daily. Takes Tylenol PM at night time. Has had trial off simvastatin which has helped with leg pains. Not fasting today.  HLD - will return tomorrow for FLP.  Relevant past medical, surgical, family and social history reviewed and updated as indicated.  Allergies and medications reviewed and updated. Current Outpatient Prescriptions on File Prior to Visit  Medication Sig  . acetaminophen (TYLENOL) 500 MG tablet Take 1,000 mg by mouth daily as needed.   . Calcium Carbonate-Vitamin D (CALCIUM 600+D) 600-400 MG-UNIT per tablet Take 1 tablet by mouth 2 (two) times daily.  . cholecalciferol (VITAMIN D) 1000 UNITS tablet Take 1,000 Units by mouth daily.  Marland Kitchen diltiazem (TIAZAC) 240 MG 24 hr capsule Take 1 capsule (240 mg total) by mouth daily.  . diphenhydramine-acetaminophen (TYLENOL PM) 25-500 MG TABS Take 2 tablets by mouth at bedtime as needed.  Marland Kitchen FLUoxetine (PROZAC) 10 MG capsule Take 1 capsule (10 mg total) by mouth daily.  . hydrochlorothiazide (MICROZIDE) 12.5 MG capsule Take 1 capsule (12.5 mg total) by mouth daily.  Marland Kitchen levothyroxine (SYNTHROID, LEVOTHROID) 175 MCG tablet Take 1/2 tablet by mouth daily  . lisinopril (PRINIVIL,ZESTRIL) 40 MG tablet Take 1 tablet (40 mg total) by mouth 2 (two) times daily.  Marland Kitchen loratadine (CLARITIN) 10 MG tablet Take 10 mg by mouth daily.  .  ranitidine (ZANTAC) 150 MG tablet Take 150 mg by mouth at bedtime.   . vitamin B-12 (CYANOCOBALAMIN) 500 MCG tablet Take 1,000 mcg by mouth daily.    No current facility-administered medications on file prior to visit.    Review of Systems Per HPI unless specifically indicated above    Objective:    BP 130/70  Pulse 60  Temp(Src) 98.2 F (36.8 C) (Oral)  Wt 241 lb (109.317 kg)  Physical Exam  Nursing note and vitals reviewed. Constitutional: She appears well-developed and well-nourished. No distress.  HENT:  Mouth/Throat: Oropharynx is clear and moist. No oropharyngeal exudate.  Cardiovascular: Normal rate, regular rhythm, normal heart sounds and intact distal pulses.   No murmur heard. Pulmonary/Chest: Effort normal and breath sounds normal. No respiratory distress. She has no wheezes. She has no rales.  Musculoskeletal: She exhibits no edema.  Compression stockings in place. Intact distal pulses   Skin: Skin is warm and dry. No rash noted.  Psychiatric: She has a normal mood and affect.   Results for orders placed in visit on 03/03/14  RENAL FUNCTION PANEL      Result Value Ref Range   Sodium 140  135 - 145 mEq/L   Potassium 3.7  3.5 - 5.1 mEq/L   Chloride 105  96 - 112 mEq/L   CO2 28  19 - 32 mEq/L   Calcium 10.1  8.4 - 10.5 mg/dL   Albumin 3.5  3.5 - 5.2 g/dL  BUN 15  6 - 23 mg/dL   Creatinine, Ser 1.1  0.4 - 1.2 mg/dL   Glucose, Bld 80  70 - 99 mg/dL   Phosphorus 2.7  2.3 - 4.6 mg/dL   GFR 50.05 (*) >60.00 mL/min      Assessment & Plan:   Problem List Items Addressed This Visit   Hyperlipidemia - Primary     Off simvastatin. Check FLP and then decide on restarting statin    Relevant Orders      Lipid panel   Cramp of both lower extremities     Check K and CPK when returns fasting tomorrow for labwork    Relevant Orders      CK      Potassium       Follow up plan: Return as needed, for follow up visit.

## 2014-07-18 NOTE — Assessment & Plan Note (Signed)
Off simvastatin. Check FLP and then decide on restarting statin

## 2014-07-18 NOTE — Assessment & Plan Note (Signed)
Check K and CPK when returns fasting tomorrow for labwork

## 2014-07-19 ENCOUNTER — Other Ambulatory Visit (INDEPENDENT_AMBULATORY_CARE_PROVIDER_SITE_OTHER): Payer: Medicare Other

## 2014-07-19 DIAGNOSIS — R252 Cramp and spasm: Secondary | ICD-10-CM

## 2014-07-19 DIAGNOSIS — E785 Hyperlipidemia, unspecified: Secondary | ICD-10-CM

## 2014-07-19 LAB — POTASSIUM: Potassium: 4 mEq/L (ref 3.5–5.1)

## 2014-07-19 LAB — LIPID PANEL
Cholesterol: 207 mg/dL — ABNORMAL HIGH (ref 0–200)
HDL: 42.1 mg/dL (ref >39.00)
LDL CALC: 121 mg/dL — AB (ref 0–99)
NonHDL: 164.9
TRIGLYCERIDES: 221 mg/dL — AB (ref 0.0–149.0)
Total CHOL/HDL Ratio: 5
VLDL: 44.2 mg/dL — AB (ref 0.0–40.0)

## 2014-07-19 LAB — CK: Total CK: 35 U/L (ref 7–177)

## 2014-07-20 ENCOUNTER — Other Ambulatory Visit: Payer: Self-pay | Admitting: Family Medicine

## 2014-07-20 MED ORDER — PRAVASTATIN SODIUM 40 MG PO TABS: 40.0000 mg | ORAL_TABLET | Freq: Every day | ORAL | Status: DC

## 2014-07-21 ENCOUNTER — Encounter: Payer: Self-pay | Admitting: *Deleted

## 2014-12-12 ENCOUNTER — Other Ambulatory Visit: Payer: Self-pay | Admitting: Family Medicine

## 2015-01-02 ENCOUNTER — Other Ambulatory Visit: Payer: Self-pay | Admitting: Family Medicine

## 2015-01-23 ENCOUNTER — Other Ambulatory Visit: Payer: Self-pay | Admitting: Family Medicine

## 2015-03-04 ENCOUNTER — Other Ambulatory Visit: Payer: Self-pay | Admitting: Family Medicine

## 2015-03-04 DIAGNOSIS — I10 Essential (primary) hypertension: Secondary | ICD-10-CM

## 2015-03-04 DIAGNOSIS — N183 Chronic kidney disease, stage 3 unspecified: Secondary | ICD-10-CM

## 2015-03-04 DIAGNOSIS — E785 Hyperlipidemia, unspecified: Secondary | ICD-10-CM

## 2015-03-04 DIAGNOSIS — E039 Hypothyroidism, unspecified: Secondary | ICD-10-CM

## 2015-03-06 ENCOUNTER — Other Ambulatory Visit (INDEPENDENT_AMBULATORY_CARE_PROVIDER_SITE_OTHER): Payer: Medicare Other

## 2015-03-06 DIAGNOSIS — E785 Hyperlipidemia, unspecified: Secondary | ICD-10-CM

## 2015-03-06 DIAGNOSIS — N183 Chronic kidney disease, stage 3 unspecified: Secondary | ICD-10-CM

## 2015-03-06 DIAGNOSIS — E039 Hypothyroidism, unspecified: Secondary | ICD-10-CM

## 2015-03-06 LAB — CBC WITH DIFFERENTIAL/PLATELET
Basophils Absolute: 0 10*3/uL (ref 0.0–0.1)
Basophils Relative: 0.8 % (ref 0.0–3.0)
Eosinophils Absolute: 0.2 10*3/uL (ref 0.0–0.7)
Eosinophils Relative: 4.6 % (ref 0.0–5.0)
HCT: 38 % (ref 36.0–46.0)
HEMOGLOBIN: 12.8 g/dL (ref 12.0–15.0)
Lymphocytes Relative: 25.9 % (ref 12.0–46.0)
Lymphs Abs: 1.1 10*3/uL (ref 0.7–4.0)
MCHC: 33.6 g/dL (ref 30.0–36.0)
MCV: 85.9 fl (ref 78.0–100.0)
MONOS PCT: 10.2 % (ref 3.0–12.0)
Monocytes Absolute: 0.4 10*3/uL (ref 0.1–1.0)
NEUTROS ABS: 2.5 10*3/uL (ref 1.4–7.7)
Neutrophils Relative %: 58.5 % (ref 43.0–77.0)
Platelets: 215 10*3/uL (ref 150.0–400.0)
RBC: 4.43 Mil/uL (ref 3.87–5.11)
RDW: 13.7 % (ref 11.5–15.5)
WBC: 4.3 10*3/uL (ref 4.0–10.5)

## 2015-03-06 LAB — RENAL FUNCTION PANEL
ALBUMIN: 4 g/dL (ref 3.5–5.2)
BUN: 21 mg/dL (ref 6–23)
CO2: 31 mEq/L (ref 19–32)
Calcium: 10.5 mg/dL (ref 8.4–10.5)
Chloride: 105 mEq/L (ref 96–112)
Creatinine, Ser: 1.12 mg/dL (ref 0.40–1.20)
GFR: 49.41 mL/min — ABNORMAL LOW (ref >60.00)
GLUCOSE: 89 mg/dL (ref 70–99)
Phosphorus: 3 mg/dL (ref 2.3–4.6)
Potassium: 4.1 mEq/L (ref 3.5–5.1)
Sodium: 139 mEq/L (ref 135–145)

## 2015-03-06 LAB — LIPID PANEL
Cholesterol: 165 mg/dL (ref 0–200)
HDL: 52.6 mg/dL (ref >39.00)
LDL CALC: 80 mg/dL (ref 0–99)
NonHDL: 112.4
Total CHOL/HDL Ratio: 3
Triglycerides: 164 mg/dL — ABNORMAL HIGH (ref 0.0–149.0)
VLDL: 32.8 mg/dL (ref 0.0–40.0)

## 2015-03-06 LAB — MICROALBUMIN / CREATININE URINE RATIO
CREATININE, U: 134.5 mg/dL
Microalb Creat Ratio: 0.5 mg/g (ref 0.0–30.0)

## 2015-03-06 LAB — TSH: TSH: 1.69 u[IU]/mL (ref 0.35–4.50)

## 2015-03-13 ENCOUNTER — Encounter: Payer: Self-pay | Admitting: Family Medicine

## 2015-03-13 ENCOUNTER — Ambulatory Visit (INDEPENDENT_AMBULATORY_CARE_PROVIDER_SITE_OTHER): Payer: Medicare Other | Admitting: Family Medicine

## 2015-03-13 VITALS — BP 132/70 | HR 76 | Temp 98.2°F | Ht 64.0 in | Wt 235.5 lb

## 2015-03-13 DIAGNOSIS — F329 Major depressive disorder, single episode, unspecified: Secondary | ICD-10-CM

## 2015-03-13 DIAGNOSIS — Z7189 Other specified counseling: Secondary | ICD-10-CM | POA: Insufficient documentation

## 2015-03-13 DIAGNOSIS — I10 Essential (primary) hypertension: Secondary | ICD-10-CM

## 2015-03-13 DIAGNOSIS — N183 Chronic kidney disease, stage 3 unspecified: Secondary | ICD-10-CM

## 2015-03-13 DIAGNOSIS — Z0001 Encounter for general adult medical examination with abnormal findings: Secondary | ICD-10-CM | POA: Insufficient documentation

## 2015-03-13 DIAGNOSIS — F32A Depression, unspecified: Secondary | ICD-10-CM

## 2015-03-13 DIAGNOSIS — Z0000 Encounter for general adult medical examination without abnormal findings: Secondary | ICD-10-CM | POA: Insufficient documentation

## 2015-03-13 DIAGNOSIS — E039 Hypothyroidism, unspecified: Secondary | ICD-10-CM

## 2015-03-13 DIAGNOSIS — E785 Hyperlipidemia, unspecified: Secondary | ICD-10-CM

## 2015-03-13 MED ORDER — HYDROCHLOROTHIAZIDE 12.5 MG PO CAPS: ORAL_CAPSULE | ORAL | Status: DC

## 2015-03-13 MED ORDER — LEVOTHYROXINE SODIUM 88 MCG PO TABS: 88.0000 ug | ORAL_TABLET | Freq: Every day | ORAL | Status: DC

## 2015-03-13 MED ORDER — LISINOPRIL 40 MG PO TABS: ORAL_TABLET | ORAL | Status: DC

## 2015-03-13 NOTE — Assessment & Plan Note (Signed)
Chronic, stable on current dose with normal TSH. Lower dose sent to pharmacy (54mcg daily)

## 2015-03-13 NOTE — Assessment & Plan Note (Signed)
Chronic, stable. Continue regimen. 

## 2015-03-13 NOTE — Patient Instructions (Addendum)
Advanced directive packet provided today. I do encourage you to start walking halls where you live.  Lower thyroid dose sent to pharmacy so you don't have to cut in half (61mcg daily). Good to see you today, call us with questions. Return as needed or in 6 months for follow up visit.

## 2015-03-13 NOTE — Assessment & Plan Note (Signed)
Advanced directives: has not set up. Would want daughter [name] to be HCPOA. Packet provided today.

## 2015-03-13 NOTE — Assessment & Plan Note (Addendum)
Preventative protocols reviewed and updated unless pt declined. Discussed healthy diet and lifestyle.  Breast exam today. Pt declines mammogram, dexa, colonoscopy and other preventative healthcare.

## 2015-03-13 NOTE — Assessment & Plan Note (Signed)
Chronic, stable. Aware to avoid NSAIDs and stay well hydrated

## 2015-03-13 NOTE — Assessment & Plan Note (Signed)
On pravastatin and seems to be tolerating well. FLP adequate for age.

## 2015-03-13 NOTE — Progress Notes (Signed)
BP 132/70 mmHg  Pulse 76  Temp(Src) 98.2 F (36.8 C) (Oral)  Ht 5\' 4"  (1.626 m)  Wt 235 lb 8 oz (106.822 kg)  BMI 40.40 kg/m2   CC: medicare wellness visit  Subjective:    Patient ID: [name], female    DOB: [birth year], 81 y.o.   MRN: [id number]  HPI: [name] is a 81 y.o. female presenting on 03/13/2015 for Annual Exam   Lives at the [place] [facility].  Daughter Holley Raring lives in [place]. Had recent birthday.   Persistent leg aches posterior thighs worse first thing in am. Denies back, hip or calf pain. Occasional LEFT hand/leg numbness. Had 1 fall.   Hearing screen - wears hearing aides bilaterally (new). Vision screen - passed. S/p bilat cataract surgery. Denies depression, anhedonia, sadness. On prozac 20mg  for last 10+ years. 1 fall in last year - 2 months ago.   Preventative: Colonoscopy - had normal colonoscopy per pt years ago.  Normal iFOB 2013. Has aged out. Mammogram - 10/2012, normal. Will age out.breast exam today. DEXA - not done. Declines.  Flu - 09/2014 Pneumovax - 09/2012. Prevnar - 02/2014 Tetanus - 2010  Shingles shot - 12/2011 Advanced directives: has not set up. Would want daughter [name] to be HCPOA. Packet provided today.  Widower since 2000 - husband battled cancer for 6 yrs, died from [place] with daughter [name]) planning on moving into retirement home. Occupation: retired  HS: 12th grade  Activity: no regular exercise  Diet: good water, fruits/vegetables daily   Relevant past medical, surgical, family and social history reviewed and updated as indicated. Interim medical history since our last visit reviewed. Allergies and medications reviewed and updated. Current Outpatient Prescriptions on File Prior to Visit  Medication Sig  . acetaminophen (TYLENOL) 500 MG tablet Take 1,000 mg by mouth daily as needed.   . Calcium Carbonate-Vitamin D (CALCIUM 600+D) 600-400 MG-UNIT per tablet Take 1 tablet by mouth 2 (two) times  daily.  . cholecalciferol (VITAMIN D) 1000 UNITS tablet Take 1,000 Units by mouth daily.  Marland Kitchen diltiazem (TIAZAC) 240 MG 24 hr capsule TAKE 1 BY MOUTH DAILY  . diphenhydramine-acetaminophen (TYLENOL PM) 25-500 MG TABS Take 2 tablets by mouth at bedtime as needed.  Marland Kitchen FLUoxetine (PROZAC) 20 MG capsule TAKE 1 BY MOUTH DAILY  . loratadine (CLARITIN) 10 MG tablet Take 10 mg by mouth daily.  . pravastatin (PRAVACHOL) 40 MG tablet Take 1 tablet (40 mg total) by mouth daily.  . ranitidine (ZANTAC) 150 MG tablet Take 150 mg by mouth at bedtime.   . vitamin B-12 (CYANOCOBALAMIN) 500 MCG tablet Take 1,000 mcg by mouth daily.    No current facility-administered medications on file prior to visit.    Review of Systems  Constitutional: Negative for fever, chills, activity change, appetite change, fatigue and unexpected weight change.  HENT: Negative for hearing loss.   Eyes: Negative for visual disturbance.  Respiratory: Negative for cough, chest tightness, shortness of breath and wheezing.   Cardiovascular: Negative for chest pain, palpitations and leg swelling.  Gastrointestinal: Negative for nausea, vomiting, abdominal pain, diarrhea, constipation, blood in stool and abdominal distention.  Genitourinary: Negative for hematuria and difficulty urinating.  Musculoskeletal: Negative for myalgias, arthralgias and neck pain.  Skin: Negative for rash.  Neurological: Negative for dizziness, seizures, syncope and headaches.  Hematological: Negative for adenopathy. Does not bruise/bleed easily.  Psychiatric/Behavioral: Negative for dysphoric mood. The patient is not nervous/anxious.    Per HPI unless  specifically indicated above     Objective:    BP 132/70 mmHg  Pulse 76  Temp(Src) 98.2 F (36.8 C) (Oral)  Ht 5\' 4"  (1.626 m)  Wt 235 lb 8 oz (106.822 kg)  BMI 40.40 kg/m2  Wt Readings from Last 3 Encounters:  03/13/15 235 lb 8 oz (106.822 kg)  07/18/14 241 lb (109.317 kg)  06/20/14 243 lb 8 oz  (110.451 kg)    Physical Exam  Constitutional: She is oriented to person, place, and time. She appears well-developed and well-nourished. No distress.  Needs assistance getting on exam table. obese  HENT:  Head: Normocephalic and atraumatic.  Right Ear: Hearing, tympanic membrane, external ear and ear canal normal.  Left Ear: Hearing, tympanic membrane, external ear and ear canal normal.  Nose: Nose normal.  Mouth/Throat: Uvula is midline, oropharynx is clear and moist and mucous membranes are normal. No oropharyngeal exudate, posterior oropharyngeal edema or posterior oropharyngeal erythema.  Eyes: Conjunctivae and EOM are normal. Pupils are equal, round, and reactive to light. No scleral icterus.  Neck: Normal range of motion. Neck supple. Carotid bruit is not present. No thyromegaly present.  Cardiovascular: Normal rate, regular rhythm, normal heart sounds and intact distal pulses.   No murmur heard. Pulses:      Radial pulses are 2+ on the right side, and 2+ on the left side.  Pulmonary/Chest: Effort normal and breath sounds normal. No respiratory distress. She has no wheezes. She has no rales. She exhibits no tenderness. Right breast exhibits no inverted nipple, no mass, no nipple discharge, no skin change and no tenderness. Left breast exhibits no inverted nipple, no mass, no nipple discharge, no skin change and no tenderness.  Abdominal: Soft. Bowel sounds are normal. She exhibits no distension and no mass. There is no tenderness. There is no rebound and no guarding.  Musculoskeletal: Normal range of motion. She exhibits no edema.  Lymphadenopathy:       Head (right side): No submental, no submandibular, no tonsillar, no preauricular, no posterior auricular and no occipital adenopathy present.       Head (left side): No submental, no submandibular, no tonsillar, no preauricular, no posterior auricular and no occipital adenopathy present.    She has no cervical adenopathy.    She has  no axillary adenopathy.       Right axillary: No lateral adenopathy present.       Left axillary: No lateral adenopathy present.      Right: No supraclavicular adenopathy present.       Left: No supraclavicular adenopathy present.  Neurological: She is alert and oriented to person, place, and time.  CN grossly intact, station and gait intact Recall 3/3  Calculation 5/5 [id number]  Skin: Skin is warm and dry. No rash noted.  Psychiatric: She has a normal mood and affect. Her behavior is normal. Judgment and thought content normal.  Nursing note and vitals reviewed.  Results for orders placed or performed in visit on 03/06/15  Lipid panel  Result Value Ref Range   Cholesterol 165 0 - 200 mg/dL   Triglycerides 164.0 (H) 0.0 - 149.0 mg/dL   HDL 52.60 >39.00 mg/dL   VLDL 32.8 0.0 - 40.0 mg/dL   LDL Cholesterol 80 0 - 99 mg/dL   Total CHOL/HDL Ratio 3    NonHDL 112.40   TSH  Result Value Ref Range   TSH 1.69 0.35 - 4.50 uIU/mL  Renal function panel  Result Value Ref Range   Sodium 139 135 -  145 mEq/L   Potassium 4.1 3.5 - 5.1 mEq/L   Chloride 105 96 - 112 mEq/L   CO2 31 19 - 32 mEq/L   Calcium 10.5 8.4 - 10.5 mg/dL   Albumin 4.0 3.5 - 5.2 g/dL   BUN 21 6 - 23 mg/dL   Creatinine, Ser 1.12 0.40 - 1.20 mg/dL   Glucose, Bld 89 70 - 99 mg/dL   Phosphorus 3.0 2.3 - 4.6 mg/dL   GFR 49.41 (L) >60.00 mL/min  CBC with Differential/Platelet  Result Value Ref Range   WBC 4.3 4.0 - 10.5 K/uL   RBC 4.43 3.87 - 5.11 Mil/uL   Hemoglobin 12.8 12.0 - 15.0 g/dL   HCT 38.0 36.0 - 46.0 %   MCV 85.9 78.0 - 100.0 fl   MCHC 33.6 30.0 - 36.0 g/dL   RDW 13.7 11.5 - 15.5 %   Platelets 215.0 150.0 - 400.0 K/uL   Neutrophils Relative % 58.5 43.0 - 77.0 %   Lymphocytes Relative 25.9 12.0 - 46.0 %   Monocytes Relative 10.2 3.0 - 12.0 %   Eosinophils Relative 4.6 0.0 - 5.0 %   Basophils Relative 0.8 0.0 - 3.0 %   Neutro Abs 2.5 1.4 - 7.7 K/uL   Lymphs Abs 1.1 0.7 - 4.0 K/uL   Monocytes Absolute 0.4  0.1 - 1.0 K/uL   Eosinophils Absolute 0.2 0.0 - 0.7 K/uL   Basophils Absolute 0.0 0.0 - 0.1 K/uL  Microalbumin / creatinine urine ratio  Result Value Ref Range   Microalb, Ur <0.7 0.0 - 1.9 mg/dL   Creatinine,U 134.5 mg/dL   Microalb Creat Ratio 0.5 0.0 - 30.0 mg/g      Assessment & Plan:   Problem List Items Addressed This Visit    Medicare annual wellness visit, subsequent - Primary    I have personally reviewed the Medicare Annual Wellness questionnaire and have noted 1. The patient's medical and social history 2. Their use of alcohol, tobacco or illicit drugs 3. Their current medications and supplements 4. The patient's functional ability including ADL's, fall risks, home safety risks and hearing or visual impairment. 5. Diet and physical activity 6. Evidence for depression or mood disorders The patients weight, height, BMI have been recorded in the chart.  Hearing and vision has been addressed. I have made referrals, counseling and provided education to the patient based review of the above and I have provided the pt with a written personalized care plan for preventive services. Provider list updated - see scanned questionairre. Reviewed preventative protocols and updated unless pt declined.       Hypothyroidism    Chronic, stable on current dose with normal TSH. Lower dose sent to pharmacy (6mcg daily)      Relevant Medications   levothyroxine (SYNTHROID, LEVOTHROID) tablet   Hypertension    Chronic, stable. Continue regimen.      Relevant Medications   hydrochlorothiazide (MICROZIDE) 12.5 MG capsule   lisinopril (PRINIVIL,ZESTRIL) tablet   Hyperlipidemia    On pravastatin and seems to be tolerating well. FLP adequate for age.      Relevant Medications   hydrochlorothiazide (MICROZIDE) 12.5 MG capsule   lisinopril (PRINIVIL,ZESTRIL) tablet   Health maintenance examination    Preventative protocols reviewed and updated unless pt declined. Discussed healthy diet  and lifestyle.  Breast exam today. Pt declines mammogram, dexa, colonoscopy and other preventative healthcare.      Depression    Chronic on prozac 20mg  daily. Continue. Did not do good on prior attempt  at taper.      CKD (chronic kidney disease) stage 3, GFR 30-59 ml/min    Chronic, stable. Aware to avoid NSAIDs and stay well hydrated      Advanced care planning/counseling discussion    Advanced directives: has not set up. Would want daughter [name] to be HCPOA. Packet provided today.          Follow up plan: Return in about 6 months (around 09/13/2015), or as needed, for follow up visit.

## 2015-03-13 NOTE — Progress Notes (Signed)
Pre visit review using our clinic review tool, if applicable. No additional management support is needed unless otherwise documented below in the visit note. 

## 2015-03-13 NOTE — Assessment & Plan Note (Signed)

## 2015-03-13 NOTE — Assessment & Plan Note (Signed)
Chronic on prozac 20mg  daily. Continue. Did not do good on prior attempt at taper.

## 2015-03-21 ENCOUNTER — Other Ambulatory Visit: Payer: Self-pay | Admitting: Family Medicine

## 2015-03-21 ENCOUNTER — Other Ambulatory Visit: Payer: Self-pay | Admitting: *Deleted

## 2015-03-27 ENCOUNTER — Other Ambulatory Visit: Payer: Self-pay

## 2015-03-27 NOTE — Telephone Encounter (Signed)
[facility]; [facility] left v/m requesting refill; please clarify directions 1 tab daily or 1/2 tab daily.Please advise.

## 2015-03-28 MED ORDER — LEVOTHYROXINE SODIUM 88 MCG PO TABS: 88.0000 ug | ORAL_TABLET | Freq: Every day | ORAL | Status: DC

## 2015-06-27 ENCOUNTER — Other Ambulatory Visit: Payer: Self-pay | Admitting: Family Medicine

## 2015-07-04 ENCOUNTER — Telehealth: Payer: Self-pay

## 2015-07-04 MED ORDER — LORAZEPAM 1 MG PO TABS: 1.0000 mg | ORAL_TABLET | Freq: Two times a day (BID) | ORAL | Status: DC | PRN

## 2015-07-04 NOTE — Telephone Encounter (Signed)
plz call - ok to send in some ativan to use for anxiety as temporary course.  I'm sorry about daughter's passing.

## 2015-07-04 NOTE — Telephone Encounter (Signed)
[name] pts daughter called to ck on mild anxiety med due to pts daughter dying this past weekend. Medication phoned to [facility] [place] as instructed. [name] voiced understanding and I offered our condolences for the familys loss.

## 2015-07-04 NOTE — Telephone Encounter (Signed)
PLEASE NOTE: All timestamps contained within this report are represented as [place] Standard Time. CONFIDENTIALTY NOTICE: This fax transmission is intended only for the addressee. It contains information that is legally privileged, confidential or otherwise protected from use or disclosure. If you are not the intended recipient, you are strictly prohibited from reviewing, disclosing, copying using or disseminating any of this information or taking any action in reliance on or regarding this information. If you have received this fax in error, please notify us immediately by telephone so that we can arrange for its return to [place]. Phone: [phone], Toll-Free: [phone], Fax: [phone] Page: 1 of 1 Call Id: [id number] [place] Patient Name: [name] Gender: Female DOB: 24-May-1930 Age: 81 Y 26 D Return Phone Number: [phone] (Primary) Address: City/State/Zip: [place] Client [place] Night - Client Client Site [place], [place] Contact Type Call Call Type Triage / [place] Name [name] Relationship To Patient Daughter Return Phone Number [phone] (Primary) Chief Complaint Prescription Refill or Medication Request (non symptomatic) Initial Comment caller wants to know if the dr could call in her mother something for her nerves - her dtr just passed away Nurse Assessment Guidelines Guideline Title Affirmed Question Affirmed Notes Nurse Date/Time ([place]ia Time) Disp. Time Eilene Ghazi Time) Disposition Final User 07/02/2015 9:13:29 AM Clinical Call Yes Harlow Mares, RN, Suanne Marker After Care Instructions Given Call Event Type User Date / Time Description Comments User: Tamala Fothergill, RN Date/Time Eilene Ghazi Time): 07/02/2015 9:13:11 AM Advised caller that MD doesn't call in any meds after hours. Advised to call back  during business hours.

## 2015-07-04 NOTE — Telephone Encounter (Signed)
Called and spoke with Stanton Kidney, expressed my condolences

## 2015-08-01 ENCOUNTER — Other Ambulatory Visit: Payer: Self-pay | Admitting: Family Medicine

## 2015-09-13 ENCOUNTER — Ambulatory Visit: Payer: Medicare Other | Admitting: Family Medicine

## 2015-09-14 ENCOUNTER — Encounter: Payer: Self-pay | Admitting: Family Medicine

## 2015-09-14 ENCOUNTER — Other Ambulatory Visit: Payer: Self-pay | Admitting: Family Medicine

## 2015-09-14 ENCOUNTER — Ambulatory Visit (INDEPENDENT_AMBULATORY_CARE_PROVIDER_SITE_OTHER): Payer: Medicare Other | Admitting: Family Medicine

## 2015-09-14 VITALS — BP 120/70 | HR 64 | Temp 98.4°F | Wt 218.0 lb

## 2015-09-14 DIAGNOSIS — I10 Essential (primary) hypertension: Secondary | ICD-10-CM

## 2015-09-14 DIAGNOSIS — R252 Cramp and spasm: Secondary | ICD-10-CM

## 2015-09-14 DIAGNOSIS — E039 Hypothyroidism, unspecified: Secondary | ICD-10-CM

## 2015-09-14 DIAGNOSIS — N183 Chronic kidney disease, stage 3 unspecified: Secondary | ICD-10-CM

## 2015-09-14 DIAGNOSIS — F32A Depression, unspecified: Secondary | ICD-10-CM

## 2015-09-14 DIAGNOSIS — Z23 Encounter for immunization: Secondary | ICD-10-CM | POA: Diagnosis not present

## 2015-09-14 DIAGNOSIS — F329 Major depressive disorder, single episode, unspecified: Secondary | ICD-10-CM | POA: Diagnosis not present

## 2015-09-14 DIAGNOSIS — E785 Hyperlipidemia, unspecified: Secondary | ICD-10-CM

## 2015-09-14 LAB — RENAL FUNCTION PANEL
Albumin: 3.9 g/dL (ref 3.5–5.2)
BUN: 20 mg/dL (ref 6–23)
CO2: 29 meq/L (ref 19–32)
CREATININE: 1.22 mg/dL — AB (ref 0.40–1.20)
Calcium: 10.4 mg/dL (ref 8.4–10.5)
Chloride: 104 mEq/L (ref 96–112)
GFR: 44.71 mL/min — AB (ref >60.00)
Glucose, Bld: 85 mg/dL (ref 70–99)
PHOSPHORUS: 3.2 mg/dL (ref 2.3–4.6)
Potassium: 4.2 mEq/L (ref 3.5–5.1)
Sodium: 140 mEq/L (ref 135–145)

## 2015-09-14 LAB — TSH: TSH: 1.16 u[IU]/mL (ref 0.35–4.50)

## 2015-09-14 MED ORDER — LORAZEPAM 1 MG PO TABS: 0.5000 mg | ORAL_TABLET | Freq: Two times a day (BID) | ORAL | Status: DC | PRN

## 2015-09-14 MED ORDER — FLUOXETINE HCL 40 MG PO CAPS: 40.0000 mg | ORAL_CAPSULE | Freq: Every day | ORAL | Status: DC

## 2015-09-14 NOTE — Assessment & Plan Note (Signed)
Recheck TSH 

## 2015-09-14 NOTE — Assessment & Plan Note (Signed)
Recheck kidney function today.  

## 2015-09-14 NOTE — Assessment & Plan Note (Signed)
No improvement off statin. rec restart statin.
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
## 2015-09-14 NOTE — Addendum Note (Signed)
Addended by: Despina Hidden on: 09/14/2015 10:30 AM   Modules accepted: Orders

## 2015-09-14 NOTE — Assessment & Plan Note (Signed)
Increased stress recently - daughter's death, older sister sick as well. Increase prozac to 40mg  daily. Continue prn ativan #20 last filled 06/2015).

## 2015-09-14 NOTE — Assessment & Plan Note (Signed)
Chronic, stable. Continue current regimen. 

## 2015-09-14 NOTE — Assessment & Plan Note (Signed)
rec restart statin.

## 2015-09-14 NOTE — Patient Instructions (Addendum)
Flu shot today. Restart pravastatin.  labwork today I've refilled ativan. Increase prozac to 40mg  daily. New dose at pharmacy. Dr Blenda Mounts is a good podiatrist Return in 6 months for follow up visit.

## 2015-09-14 NOTE — Progress Notes (Signed)
Pre visit review using our clinic review tool, if applicable. No additional management support is needed unless otherwise documented below in the visit note. 

## 2015-09-15 ENCOUNTER — Encounter: Payer: Self-pay | Admitting: *Deleted

## 2016-03-07 ENCOUNTER — Telehealth: Payer: Self-pay

## 2016-03-07 ENCOUNTER — Ambulatory Visit (INDEPENDENT_AMBULATORY_CARE_PROVIDER_SITE_OTHER): Payer: Medicare Other

## 2016-03-07 ENCOUNTER — Encounter: Payer: Self-pay | Admitting: Radiology

## 2016-03-07 ENCOUNTER — Other Ambulatory Visit: Payer: Self-pay | Admitting: Family Medicine

## 2016-03-07 ENCOUNTER — Other Ambulatory Visit (INDEPENDENT_AMBULATORY_CARE_PROVIDER_SITE_OTHER): Payer: Medicare Other

## 2016-03-07 VITALS — BP 122/80 | HR 70 | Temp 98.8°F | Ht 63.0 in | Wt 229.5 lb

## 2016-03-07 DIAGNOSIS — E2839 Other primary ovarian failure: Secondary | ICD-10-CM

## 2016-03-07 DIAGNOSIS — N183 Chronic kidney disease, stage 3 unspecified: Secondary | ICD-10-CM

## 2016-03-07 DIAGNOSIS — E039 Hypothyroidism, unspecified: Secondary | ICD-10-CM | POA: Diagnosis not present

## 2016-03-07 DIAGNOSIS — E785 Hyperlipidemia, unspecified: Secondary | ICD-10-CM

## 2016-03-07 DIAGNOSIS — Z0000 Encounter for general adult medical examination without abnormal findings: Secondary | ICD-10-CM | POA: Diagnosis not present

## 2016-03-07 DIAGNOSIS — Z79899 Other long term (current) drug therapy: Secondary | ICD-10-CM | POA: Diagnosis not present

## 2016-03-07 LAB — RENAL FUNCTION PANEL
Albumin: 4.2 g/dL (ref 3.5–5.2)
BUN: 14 mg/dL (ref 6–23)
CALCIUM: 10.6 mg/dL — AB (ref 8.4–10.5)
CHLORIDE: 103 meq/L (ref 96–112)
CO2: 32 meq/L (ref 19–32)
CREATININE: 1.21 mg/dL — AB (ref 0.40–1.20)
GFR: 45.08 mL/min — AB (ref >60.00)
Glucose, Bld: 89 mg/dL (ref 70–99)
Phosphorus: 3.1 mg/dL (ref 2.3–4.6)
Potassium: 4.1 mEq/L (ref 3.5–5.1)
Sodium: 141 mEq/L (ref 135–145)

## 2016-03-07 LAB — CBC WITH DIFFERENTIAL/PLATELET
BASOS ABS: 0 10*3/uL (ref 0.0–0.1)
BASOS PCT: 0.8 % (ref 0.0–3.0)
Eosinophils Absolute: 0.2 10*3/uL (ref 0.0–0.7)
Eosinophils Relative: 2.7 % (ref 0.0–5.0)
HEMATOCRIT: 38.4 % (ref 36.0–46.0)
Hemoglobin: 12.8 g/dL (ref 12.0–15.0)
LYMPHS ABS: 1.6 10*3/uL (ref 0.7–4.0)
Lymphocytes Relative: 26.8 % (ref 12.0–46.0)
MCHC: 33.3 g/dL (ref 30.0–36.0)
MCV: 86.9 fl (ref 78.0–100.0)
MONOS PCT: 10.1 % (ref 3.0–12.0)
Monocytes Absolute: 0.6 10*3/uL (ref 0.1–1.0)
NEUTROS ABS: 3.5 10*3/uL (ref 1.4–7.7)
NEUTROS PCT: 59.6 % (ref 43.0–77.0)
PLATELETS: 268 10*3/uL (ref 150.0–400.0)
RBC: 4.41 Mil/uL (ref 3.87–5.11)
RDW: 13.7 % (ref 11.5–15.5)
WBC: 5.8 10*3/uL (ref 4.0–10.5)

## 2016-03-07 LAB — LIPID PANEL
CHOL/HDL RATIO: 3
Cholesterol: 188 mg/dL (ref 0–200)
HDL: 56.4 mg/dL (ref >39.00)
LDL Cholesterol: 96 mg/dL (ref 0–99)
NONHDL: 131.51
TRIGLYCERIDES: 180 mg/dL — AB (ref 0.0–149.0)
VLDL: 36 mg/dL (ref 0.0–40.0)

## 2016-03-07 LAB — TSH: TSH: 1.45 u[IU]/mL (ref 0.35–4.50)

## 2016-03-07 LAB — VITAMIN D 25 HYDROXY (VIT D DEFICIENCY, FRACTURES): VITD: 39.2 ng/mL (ref 30.00–100.00)

## 2016-03-07 NOTE — Progress Notes (Signed)
Subjective:   [name] is a 82 y.o. female who presents for Medicare Annual (Subsequent) preventive examination.  Cardiac Risk Factors include: advanced age (>83men, >70 women);dyslipidemia;obesity (BMI >30kg/m2)     Objective:     Vitals: BP 122/80 mmHg  Pulse 70  Temp(Src) 98.8 F (37.1 C) (Oral)  Ht 5\' 3"  (1.6 m)  Wt 229 lb 8 oz (104.101 kg)  BMI 40.66 kg/m2  SpO2 96%  Tobacco History  Smoking status  . Former Smoker  . Quit date: 12/30/1992  Smokeless tobacco  . Never Used     Counseling given: No   Past Medical History  Diagnosis Date  . Arthritis   . History of chicken pox   . Depression   . History of ulcer disease 1990s  . HTN (hypertension)   . Hypothyroidism   . HLD (hyperlipidemia)   . History of anemia   . Uses hearing aid    Past Surgical History  Procedure Laterality Date  . Cataract extraction Bilateral 2012  . Gallbladder surgery  1974  . Appendectomy  1974  . Tonsillectomy  1945  . Back surgery  1979  . Knee surgery  1987    right  . Knee surgery  1998    left  . Knee surgery  2008  . US echocardiography  2010    LVEF 65-75%, overall normal, mildly decreased LV diastolic compliance  . Cardiovascular stress test  2010    WNL   Family History  Problem Relation Age of Onset  . Cancer Mother 56    lung, nonsmoker  . Cancer Father     lung, nonsmoker  . Cancer Brother 19    lung, smoker  . Cancer Sister 79    brain  . Cancer Sister 54    lung, smoker  . COPD Sister     smoker  . Cancer Sister 34    colon  . Hypertension Daughter   . CAD Neg Hx   . Stroke Neg Hx   . Diabetes Daughter    History  Sexual Activity  . Sexual Activity: No    Outpatient Encounter Prescriptions as of 03/07/2016  Medication Sig  . acetaminophen (TYLENOL) 500 MG tablet Take 1,000 mg by mouth daily as needed.   . Calcium Carbonate-Vitamin D (CALCIUM 600+D) 600-400 MG-UNIT per tablet Take 1 tablet by mouth 2 (two) times daily.  .  cholecalciferol (VITAMIN D) 1000 UNITS tablet Take 1,000 Units by mouth daily.  Marland Kitchen diltiazem (TIAZAC) 240 MG 24 hr capsule TAKE 1 BY MOUTH DAILY  . diphenhydramine-acetaminophen (TYLENOL PM) 25-500 MG TABS Take 2 tablets by mouth at bedtime as needed.  Marland Kitchen FLUoxetine (PROZAC) 40 MG capsule Take 1 capsule (40 mg total) by mouth daily.  . hydrochlorothiazide (MICROZIDE) 12.5 MG capsule TAKE 1 BY MOUTH DAILY  . levothyroxine (SYNTHROID, LEVOTHROID) 88 MCG tablet Take 1 tablet (88 mcg total) by mouth daily before breakfast.  . lisinopril (PRINIVIL,ZESTRIL) 40 MG tablet TAKE 1 BY MOUTH TWICE DAILY  . loratadine (CLARITIN) 10 MG tablet Take 10 mg by mouth daily.  Marland Kitchen LORazepam (ATIVAN) 1 MG tablet Take 0.5-1 tablets (0.5-1 mg total) by mouth 2 (two) times daily as needed for anxiety.  . pravastatin (PRAVACHOL) 40 MG tablet TAKE 1 TABLET (40 MG TOTAL) BY MOUTH DAILY.  . ranitidine (ZANTAC) 150 MG tablet Take 150 mg by mouth at bedtime.   . vitamin B-12 (CYANOCOBALAMIN) 500 MCG tablet Take 1,000 mcg by mouth daily.  No facility-administered encounter medications on file as of 03/07/2016.    Activities of Daily Living In your present state of health, do you have any difficulty performing the following activities: 03/07/2016  Hearing? N  Vision? Y  Difficulty concentrating or making decisions? N  Walking or climbing stairs? N  Dressing or bathing? N  Doing errands, shopping? N  Preparing Food and eating ? N  Using the Toilet? N  In the past six months, have you accidently leaked urine? Y  Do you have problems with loss of bowel control? N  Managing your Medications? N  Managing your Finances? N  Housekeeping or managing your Housekeeping? N    Patient Care Team: [name], MD as PCP - General (Family Medicine)  Dr. Webb Laws - Opthalmology     Assessment:    Exercise Activities and Dietary recommendations Current Exercise Habits: Home exercise routine, Type of exercise: walking,  Time (Minutes): 30, Frequency (Times/Week): 7, Weekly Exercise (Minutes/Week): 210, Intensity: Mild  Goals    . Increase physical activity     Starting 03/11/2016, I will go to exercise room at assisted living facility and do 1 exercise at least once per week.       Fall Risk Fall Risk  03/07/2016 03/13/2015 03/10/2014 12/17/2012  Falls in the past year? No No No No  Risk for fall due to : Impaired balance/gait - - -   Depression Screen PHQ 2/9 Scores 03/07/2016 03/13/2015 03/10/2014 12/17/2012  PHQ - 2 Score 0 0 0 0     Cognitive Testing MMSE - Mini Mental State Exam 03/07/2016  Orientation to time 5  Orientation to Place 5  Registration 3  Attention/ Calculation 5  Recall 3  Language- name 2 objects 0  Language- repeat 1  Language- follow 3 step command 3  Language- read & follow direction 1  Write a sentence 0  Copy design 0  Total score 26   Hearing Screening Comments: Wears bilateral hearing aids Vision Screening Comments: Last eye exam was 09/13/2011   Immunization History  Administered Date(s) Administered  . Influenza Whole 10/30/2013  . Influenza,inj,Quad PF,36+ Mos 09/14/2015  . Influenza-Unspecified 10/07/2014  . Pneumococcal Conjugate-13 03/10/2014  . Pneumococcal Polysaccharide-23 10/27/2012  . Td 12/30/2008  . Zoster 12/31/2011   Screening Tests Health Maintenance  Topic Date Due  . DEXA SCAN  Will schedule  . INFLUENZA VACCINE  07/30/2016  . TETANUS/TDAP  12/30/2018  . DTaP/Tdap/Td  Completed  . ZOSTAVAX  Addressed  . PNA vac Low Risk Adult  Completed      Plan:     I have personally reviewed the Medicare Annual Wellness questionnaire and have noted the following in the patient's chart:  A. Medical and social history B. Use of alcohol, tobacco or illicit drugs  C. Current medications and supplements D. Functional ability and status E.  Nutritional status F.  Physical activity G. Advance directives H. List of other physicians I.  Hospitalizations,  surgeries, and ER visits in previous 12 months J.  [place] to include hearing, vision, cognitive, depression L. Referrals - DEXA scan M.  Appointments - opthalmology  In addition, I reviewed preventive protocols, quality metrics, and best practice recommendations specific to patient. A written personalized care plan for preventive services as well as general preventive health recommendations were provided to patient.  See attached scanned questionnaire for additional information.   Signed,   Lindell Noe, MHA, BS, LPN Health Advisor [id number]

## 2016-03-07 NOTE — Telephone Encounter (Signed)
Spoke with [name] at Dr. Irven Shelling office. Last comprehensive eye exam was 09/13/2011. Scheduled future appt for April 03, 2016 @ 10:00AM.  Contacted patient and notified her of eye appt info.

## 2016-03-07 NOTE — Patient Instructions (Signed)
[name] , Thank you for taking time to come for your Medicare Wellness Visit. I appreciate your ongoing commitment to your health goals. Please review the following plan we discussed and let me know if I can assist you in the future.   These are the goals we discussed: Goals    . Increase physical activity     Starting 03/11/2016, I will go to exercise room at assisted living facility and do 1 exercise at least once per week.        This is a list of the screening recommended for you and due dates:  Health Maintenance  Topic Date Due  . DEXA scan (bone density measurement)  Will schedule  . Flu Shot  07/30/2016  . Tetanus Vaccine  12/30/2018  . DTaP/Tdap/Td vaccine  Completed  . Shingles Vaccine  Addressed  . Pneumonia vaccines  Completed   Preventive Care for Women  A healthy lifestyle and preventive care can promote health and wellness. Preventive health guidelines for women include the following key practices.  . A routine yearly physical is a good way to check with your health care provider about your health and preventive screening. It is a chance to share any concerns and updates on your health and to receive a thorough exam.  . Visit your dentist for a routine exam and preventive care every 6 months. Brush your teeth twice a day and floss once a day. Good oral hygiene prevents tooth decay and gum disease.  . The frequency of eye exams is based on your age, health, family medical history, use  of contact lenses, and other factors. Follow your health care provider's ecommendations for frequency of eye exams.  . Eat a healthy diet. Foods like vegetables, fruits, whole grains, low-fat dairy products, and lean protein foods contain the nutrients you need without too many calories. Decrease your intake of foods high in solid fats, added sugars, and salt. Eat the right amount of calories for you. Get information about a proper diet from your health care provider, if  necessary.  . Regular physical exercise is one of the most important things you can do for your health. Most adults should get at least 150 minutes of moderate-intensity exercise (any activity that increases your heart rate and causes you to sweat) each week. In addition, most adults need muscle-strengthening exercises on 2 or more days a week.  . Maintain a healthy weight. The body mass index (BMI) is a screening tool to identify possible weight problems. It provides an estimate of body fat based on height and weight. Your health care provider can find your BMI and can help you achieve or maintain a healthy weight.   For adults 20 years and older: ? A BMI below 18.5 is considered underweight. ? A BMI of 18.5 to 24.9 is normal. ? A BMI of 25 to 29.9 is considered overweight. ? A BMI of 30 and above is considered obese.   . Maintain normal blood lipids and cholesterol levels by exercising and minimizing your intake of saturated fat. Eat a balanced diet with plenty of fruit and vegetables. Blood tests for lipids and cholesterol should begin at age 53 and be repeated every 5 years. If your lipid or cholesterol levels are high, you are over 50, or you are at high risk for heart disease, you may need your cholesterol levels checked more frequently. Ongoing high lipid and cholesterol levels should be treated with medicines if diet and exercise are not working.  Marland Kitchen  If you smoke, find out from your health care provider how to quit. If you do not use tobacco, please do not start.  . If you choose to drink alcohol, please do not consume more than 2 drinks per day. One drink is considered to be 12 ounces (355 mL) of beer, 5 ounces (148 mL) of wine, or 1.5 ounces (44 mL) of liquor.  . If you are 61-78 years old, ask your health care provider if you should take aspirin to prevent strokes.  . Osteoporosis is a disease in which the bones lose minerals and strength with aging. This can result in serious bone  fractures or breaks. The risk of osteoporosis can be identified using a bone density scan. Women ages 7 years and over and women at risk for fractures or osteoporosis should discuss screening with their health care providers. Ask your health care provider whether you should take a calcium supplement or vitamin D to reduce the rate of osteoporosis.  . Menopause can be associated with physical symptoms and risks. Hormone replacement therapy is available to decrease symptoms and risks. You should talk to your health care provider about whether hormone replacement therapy is right for you.  . Use sunscreen. Apply sunscreen liberally and repeatedly throughout the day. You should seek shade when your shadow is shorter than you. Protect yourself by wearing long sleeves, pants, a wide-brimmed hat, and sunglasses year round, whenever you are outdoors.  . Once a month, do a whole body skin exam, using a mirror to look at the skin on your back. Tell your health care provider of new moles, moles that have irregular borders, moles that are larger than a pencil eraser, or moles that have changed in shape or color.

## 2016-03-07 NOTE — Progress Notes (Signed)
Patient concerns reported:   A. Both hands are "falling asleep" more often B. Increased episodes of insomnia

## 2016-03-07 NOTE — Progress Notes (Signed)
Pre visit review using our clinic review tool, if applicable. No additional management support is needed unless otherwise documented below in the visit note. 

## 2016-03-08 NOTE — Progress Notes (Signed)
I reviewed health advisor's note, was available for consultation, and agree with documentation and plan.  

## 2016-03-11 ENCOUNTER — Other Ambulatory Visit: Payer: Medicare Other

## 2016-03-14 ENCOUNTER — Encounter: Payer: Self-pay | Admitting: Family Medicine

## 2016-03-14 ENCOUNTER — Ambulatory Visit (INDEPENDENT_AMBULATORY_CARE_PROVIDER_SITE_OTHER): Payer: Medicare Other | Admitting: Family Medicine

## 2016-03-14 VITALS — BP 136/74 | HR 76 | Temp 97.5°F | Wt 231.0 lb

## 2016-03-14 DIAGNOSIS — E039 Hypothyroidism, unspecified: Secondary | ICD-10-CM

## 2016-03-14 DIAGNOSIS — I10 Essential (primary) hypertension: Secondary | ICD-10-CM

## 2016-03-14 DIAGNOSIS — Z0000 Encounter for general adult medical examination without abnormal findings: Secondary | ICD-10-CM | POA: Diagnosis not present

## 2016-03-14 DIAGNOSIS — N183 Chronic kidney disease, stage 3 unspecified: Secondary | ICD-10-CM

## 2016-03-14 DIAGNOSIS — E785 Hyperlipidemia, unspecified: Secondary | ICD-10-CM

## 2016-03-14 DIAGNOSIS — F32A Depression, unspecified: Secondary | ICD-10-CM

## 2016-03-14 DIAGNOSIS — F329 Major depressive disorder, single episode, unspecified: Secondary | ICD-10-CM

## 2016-03-14 DIAGNOSIS — Z7189 Other specified counseling: Secondary | ICD-10-CM

## 2016-03-14 MED ORDER — DILTIAZEM HCL ER BEADS 240 MG PO CP24: 240.0000 mg | ORAL_CAPSULE | Freq: Every day | ORAL | Status: DC

## 2016-03-14 MED ORDER — LISINOPRIL 40 MG PO TABS: ORAL_TABLET | ORAL | Status: DC

## 2016-03-14 MED ORDER — CALCIUM CARBONATE-VITAMIN D 600-400 MG-UNIT PO TABS: 1.0000 | ORAL_TABLET | Freq: Every day | ORAL | Status: DC

## 2016-03-14 MED ORDER — FLUOXETINE HCL 20 MG PO TABS: 20.0000 mg | ORAL_TABLET | Freq: Every day | ORAL | Status: DC

## 2016-03-14 MED ORDER — HYDROCHLOROTHIAZIDE 12.5 MG PO CAPS: 12.5000 mg | ORAL_CAPSULE | Freq: Every day | ORAL | Status: DC

## 2016-03-14 MED ORDER — LEVOTHYROXINE SODIUM 88 MCG PO TABS: 88.0000 ug | ORAL_TABLET | Freq: Every day | ORAL | Status: DC

## 2016-03-14 NOTE — Assessment & Plan Note (Signed)
Advanced directives: has not set up. Would want daughter [name] to be HCPOA. Packet provided today and at [facility] visit.

## 2016-03-14 NOTE — Patient Instructions (Addendum)
Try lower prozac '20mg'$  dose sent to local pharmacy. If doing well on lower dose, we will continue this. Let me know after 1 month how we're doing.  We will continue to watch kidney function - avoid anti inflammatories (ibuporfen, advil, aleve). Tylenol use is ok. Stay well hydrated. If you drink 1 glass of milk daily, ok to decrease calcium pill to once daily.   Health Maintenance, Female Adopting a healthy lifestyle and getting preventive care can go a long way to promote health and wellness. Talk with your health care provider about what schedule of regular examinations is right for you. This is a good chance for you to check in with your provider about disease prevention and staying healthy. In between checkups, there are plenty of things you can do on your own. Experts have done a lot of research about which lifestyle changes and preventive measures are most likely to keep you healthy. Ask your health care provider for more information. WEIGHT AND DIET  Eat a healthy diet  Be sure to include plenty of vegetables, fruits, low-fat dairy products, and lean protein.  Do not eat a lot of foods high in solid fats, added sugars, or salt.  Get regular exercise. This is one of the most important things you can do for your health.  Most adults should exercise for at least 150 minutes each week. The exercise should increase your heart rate and make you sweat (moderate-intensity exercise).  Most adults should also do strengthening exercises at least twice a week. This is in addition to the moderate-intensity exercise.  Maintain a healthy weight  Body mass index (BMI) is a measurement that can be used to identify possible weight problems. It estimates body fat based on height and weight. Your health care provider can help determine your BMI and help you achieve or maintain a healthy weight.  For females 41 years of age and older:   A BMI below 18.5 is considered underweight.  A BMI of 18.5 to 24.9  is normal.  A BMI of 25 to 29.9 is considered overweight.  A BMI of 30 and above is considered obese.  Watch levels of cholesterol and blood lipids  You should start having your blood tested for lipids and cholesterol at 80 years of age, then have this test every 5 years.  You may need to have your cholesterol levels checked more often if:  Your lipid or cholesterol levels are high.  You are older than 80 years of age.  You are at high risk for heart disease.  CANCER SCREENING   Lung Cancer  Lung cancer screening is recommended for adults 19-8 years old who are at high risk for lung cancer because of a history of smoking.  A yearly low-dose CT scan of the lungs is recommended for people who:  Currently smoke.  Have quit within the past 15 years.  Have at least a 30-pack-year history of smoking. A pack year is smoking an average of one pack of cigarettes a day for 1 year.  Yearly screening should continue until it has been 15 years since you quit.  Yearly screening should stop if you develop a health problem that would prevent you from having lung cancer treatment.  Breast Cancer  Practice breast self-awareness. This means understanding how your breasts normally appear and feel.  It also means doing regular breast self-exams. Let your health care provider know about any changes, no matter how small.  If you are in your 61s  or 50s, you should have a clinical breast exam (CBE) by a health care provider every 1-3 years as part of a regular health exam.  If you are 38 or older, have a CBE every year. Also consider having a breast X-ray (mammogram) every year.  If you have a family history of breast cancer, talk to your health care provider about genetic screening.  If you are at high risk for breast cancer, talk to your health care provider about having an MRI and a mammogram every year.  Breast cancer gene (BRCA) assessment is recommended for women who have family  members with BRCA-related cancers. BRCA-related cancers include:  Breast.  Ovarian.  Tubal.  Peritoneal cancers.  Results of the assessment will determine the need for genetic counseling and BRCA1 and BRCA2 testing. Cervical Cancer Your health care provider may recommend that you be screened regularly for cancer of the pelvic organs (ovaries, uterus, and vagina). This screening involves a pelvic examination, including checking for microscopic changes to the surface of your cervix (Pap test). You may be encouraged to have this screening done every 3 years, beginning at age 72.  For women ages 80-65, health care providers may recommend pelvic exams and Pap testing every 3 years, or they may recommend the Pap and pelvic exam, combined with testing for human papilloma virus (HPV), every 5 years. Some types of HPV increase your risk of cervical cancer. Testing for HPV may also be done on women of any age with unclear Pap test results.  Other health care providers may not recommend any screening for nonpregnant women who are considered low risk for pelvic cancer and who do not have symptoms. Ask your health care provider if a screening pelvic exam is right for you.  If you have had past treatment for cervical cancer or a condition that could lead to cancer, you need Pap tests and screening for cancer for at least 20 years after your treatment. If Pap tests have been discontinued, your risk factors (such as having a new sexual partner) need to be reassessed to determine if screening should resume. Some women have medical problems that increase the chance of getting cervical cancer. In these cases, your health care provider may recommend more frequent screening and Pap tests. Colorectal Cancer  This type of cancer can be detected and often prevented.  Routine colorectal cancer screening usually begins at 80 years of age and continues through 80 years of age.  Your health care provider may recommend  screening at an earlier age if you have risk factors for colon cancer.  Your health care provider may also recommend using home test kits to check for hidden blood in the stool.  A small camera at the end of a tube can be used to examine your colon directly (sigmoidoscopy or colonoscopy). This is done to check for the earliest forms of colorectal cancer.  Routine screening usually begins at age 48.  Direct examination of the colon should be repeated every 5-10 years through 80 years of age. However, you may need to be screened more often if early forms of precancerous polyps or small growths are found. Skin Cancer  Check your skin from head to toe regularly.  Tell your health care provider about any new moles or changes in moles, especially if there is a change in a mole's shape or color.  Also tell your health care provider if you have a mole that is larger than the size of a pencil eraser.  Always use sunscreen. Apply sunscreen liberally and repeatedly throughout the day.  Protect yourself by wearing long sleeves, pants, a wide-brimmed hat, and sunglasses whenever you are outside. HEART DISEASE, DIABETES, AND HIGH BLOOD PRESSURE   High blood pressure causes heart disease and increases the risk of stroke. High blood pressure is more likely to develop in:  People who have blood pressure in the high end of the normal range (130-139/85-89 mm Hg).  People who are overweight or obese.  People who are African American.  If you are 37-81 years of age, have your blood pressure checked every 3-5 years. If you are 95 years of age or older, have your blood pressure checked every year. You should have your blood pressure measured twice--once when you are at a hospital or clinic, and once when you are not at a hospital or clinic. Record the average of the two measurements. To check your blood pressure when you are not at a hospital or clinic, you can use:  An automated blood pressure machine at a  pharmacy.  A home blood pressure monitor.  If you are between 51 years and 29 years old, ask your health care provider if you should take aspirin to prevent strokes.  Have regular diabetes screenings. This involves taking a blood sample to check your fasting blood sugar level.  If you are at a normal weight and have a low risk for diabetes, have this test once every three years after 80 years of age.  If you are overweight and have a high risk for diabetes, consider being tested at a younger age or more often. PREVENTING INFECTION  Hepatitis B  If you have a higher risk for hepatitis B, you should be screened for this virus. You are considered at high risk for hepatitis B if:  You were born in a country where hepatitis B is common. Ask your health care provider which countries are considered high risk.  Your parents were born in a high-risk country, and you have not been immunized against hepatitis B (hepatitis B vaccine).  You have HIV or AIDS.  You use needles to inject street drugs.  You live with someone who has hepatitis B.  You have had sex with someone who has hepatitis B.  You get hemodialysis treatment.  You take certain medicines for conditions, including cancer, organ transplantation, and autoimmune conditions. Hepatitis C  Blood testing is recommended for:  Everyone born from [birth year] through 1963.  Anyone with known risk factors for hepatitis C. Sexually transmitted infections (STIs)  You should be screened for sexually transmitted infections (STIs) including gonorrhea and chlamydia if:  You are sexually active and are younger than 80 years of age.  You are older than 80 years of age and your health care provider tells you that you are at risk for this type of infection.  Your sexual activity has changed since you were last screened and you are at an increased risk for chlamydia or gonorrhea. Ask your health care provider if you are at risk.  If you do not  have HIV, but are at risk, it may be recommended that you take a prescription medicine daily to prevent HIV infection. This is called pre-exposure prophylaxis (PrEP). You are considered at risk if:  You are sexually active and do not regularly use condoms or know the HIV status of your partner(s).  You take drugs by injection.  You are sexually active with a partner who has HIV. Talk with your health  care provider about whether you are at high risk of being infected with HIV. If you choose to begin PrEP, you should first be tested for HIV. You should then be tested every 3 months for as long as you are taking PrEP.  PREGNANCY   If you are premenopausal and you may become pregnant, ask your health care provider about preconception counseling.  If you may become pregnant, take 400 to 800 micrograms (mcg) of folic acid every day.  If you want to prevent pregnancy, talk to your health care provider about birth control (contraception). OSTEOPOROSIS AND MENOPAUSE   Osteoporosis is a disease in which the bones lose minerals and strength with aging. This can result in serious bone fractures. Your risk for osteoporosis can be identified using a bone density scan.  If you are 72 years of age or older, or if you are at risk for osteoporosis and fractures, ask your health care provider if you should be screened.  Ask your health care provider whether you should take a calcium or vitamin D supplement to lower your risk for osteoporosis.  Menopause may have certain physical symptoms and risks.  Hormone replacement therapy may reduce some of these symptoms and risks. Talk to your health care provider about whether hormone replacement therapy is right for you.  HOME CARE INSTRUCTIONS   Schedule regular health, dental, and eye exams.  Stay current with your immunizations.   Do not use any tobacco products including cigarettes, chewing tobacco, or electronic cigarettes.  If you are pregnant, do not  drink alcohol.  If you are breastfeeding, limit how much and how often you drink alcohol.  Limit alcohol intake to no more than 1 drink per day for nonpregnant women. One drink equals 12 ounces of beer, 5 ounces of wine, or 1 ounces of hard liquor.  Do not use street drugs.  Do not share needles.  Ask your health care provider for help if you need support or information about quitting drugs.  Tell your health care provider if you often feel depressed.  Tell your health care provider if you have ever been abused or do not feel safe at home.   This information is not intended to replace advice given to you by your health care provider. Make sure you discuss any questions you have with your health care provider.   Document Released: 07/01/2011 Document Revised: 01/06/2015 Document Reviewed: 11/17/2013 Elsevier Interactive Patient Education [employer].

## 2016-03-14 NOTE — Assessment & Plan Note (Signed)
Chronic, stable. Continue current regimen. 

## 2016-03-14 NOTE — Assessment & Plan Note (Signed)
Stable period. Agrees to trial lower prozac - sent 20mg  dose to pharmacy. If stable ok to send lower dose to mail order.

## 2016-03-14 NOTE — Assessment & Plan Note (Signed)
Chronic, stable. Continue statin.  

## 2016-03-14 NOTE — Progress Notes (Signed)
Pre visit review using our clinic review tool, if applicable. No additional management support is needed unless otherwise documented below in the visit note. 

## 2016-03-14 NOTE — Assessment & Plan Note (Signed)
Reviewed progressive decline over last 3 years but overall stable. Vit D normal. Further eval next visit. Discussed avoiding NSAIDs and increasing water intake

## 2016-03-14 NOTE — Assessment & Plan Note (Addendum)
Chronic, stable. refilled levothyroxine

## 2016-03-14 NOTE — Progress Notes (Signed)
BP 136/74 mmHg  Pulse 76  Temp(Src) 97.5 F (36.4 C) (Oral)  Wt 231 lb (104.781 kg)   CC: CPE  Subjective:    Patient ID: [name], female    DOB: 08-01-1930, 82 y.o.   MRN: [id number]  HPI: FARNAZ DERMODY is a 82 y.o. female presenting on 03/14/2016 for Annual Exam   Had wellness visit with Katha Cabal 03/07/2016.  Regularly takes fluoxetine 40mg  daily. Rare lorazepam use. Feels less stress after daughter's passing (was caregiver).   Hearing screen - wears hearing aides which helps Vision screen - scheduled 03/2016 No falls in last year Denies depression, anhedonia, sadness. On prozac 20mg  for last 10+ years.  Preventative: Colonoscopy - had normal colonoscopy per pt years ago. Normal iFOB Mar 25, 2012. Has aged out. Mammogram - 10/2012, normal. Declines repeat, declines breast exam in office. Does do breast exams at home.  Well woman - aged out. Declines pelvic exam. Denies pelvic pain or bleeding. DEXA - endorses remotely normal. Declines rpt.  Flu - yearly Pneumovax - 09/2012. Prevnar - 03/25/14 Tetanus - 2009/03/25  Shingles shot - 12/2011 Advanced directives: has not set up. Would want daughter [name] to be HCPOA. Packet provided today and at [facility] visit. Seat belt use discussed Sunscreen use discussed. No changing moles on skin.   Widower since 03-26-99 - husband battled cancer for 6 yrs, died from MI Daughter Leeza Grala) passed away 03/26/15. Other daughter [name] nearby.  Lives in retirement home - [facility] on [place].  Occupation: retired  HS: 12th grade  Activity: no regular exercise  Diet: good water, fruits/vegetables daily  Relevant past medical, surgical, family and social history reviewed and updated as indicated. Interim medical history since our last visit reviewed. Allergies and medications reviewed and updated. Current Outpatient Prescriptions on File Prior to Visit  Medication Sig  . acetaminophen (TYLENOL) 500 MG tablet Take 1,000 mg by mouth daily as needed.   .  cholecalciferol (VITAMIN D) 1000 UNITS tablet Take 1,000 Units by mouth daily.  . diphenhydramine-acetaminophen (TYLENOL PM) 25-500 MG TABS Take 2 tablets by mouth at bedtime as needed.  . loratadine (CLARITIN) 10 MG tablet Take 10 mg by mouth daily.  Marland Kitchen LORazepam (ATIVAN) 1 MG tablet Take 0.5-1 tablets (0.5-1 mg total) by mouth 2 (two) times daily as needed for anxiety.  . pravastatin (PRAVACHOL) 40 MG tablet TAKE 1 TABLET (40 MG TOTAL) BY MOUTH DAILY.  . ranitidine (ZANTAC) 150 MG tablet Take 150 mg by mouth at bedtime.   . vitamin B-12 (CYANOCOBALAMIN) 500 MCG tablet Take 1,000 mcg by mouth daily.    No current facility-administered medications on file prior to visit.    Review of Systems  Constitutional: Negative for fever, chills, activity change, appetite change, fatigue and unexpected weight change.  HENT: Negative for hearing loss.   Eyes: Negative for visual disturbance.  Respiratory: Negative for cough, chest tightness, shortness of breath and wheezing.   Cardiovascular: Negative for chest pain, palpitations and leg swelling.  Gastrointestinal: Negative for nausea, vomiting, abdominal pain, diarrhea, constipation, blood in stool and abdominal distention.  Genitourinary: Negative for hematuria and difficulty urinating.  Musculoskeletal: Negative for myalgias, arthralgias and neck pain.  Skin: Negative for rash.  Neurological: Negative for dizziness, seizures, syncope and headaches.  Hematological: Negative for adenopathy. Does not bruise/bleed easily.  Psychiatric/Behavioral: Negative for dysphoric mood. The patient is not nervous/anxious.    Per HPI unless specifically indicated in ROS section     Objective:    BP  136/74 mmHg  Pulse 76  Temp(Src) 97.5 F (36.4 C) (Oral)  Wt 231 lb (104.781 kg)  Wt Readings from Last 3 Encounters:  03/14/16 231 lb (104.781 kg)  03/07/16 229 lb 8 oz (104.101 kg)  09/14/15 218 lb (98.884 kg)    Physical Exam  Constitutional: She is  oriented to person, place, and time. She appears well-developed and well-nourished. No distress.  HENT:  Head: Normocephalic and atraumatic.  Right Ear: Hearing, tympanic membrane, external ear and ear canal normal.  Left Ear: Hearing, tympanic membrane, external ear and ear canal normal.  Nose: Nose normal.  Mouth/Throat: Uvula is midline, oropharynx is clear and moist and mucous membranes are normal. No oropharyngeal exudate, posterior oropharyngeal edema or posterior oropharyngeal erythema.  Eyes: Conjunctivae and EOM are normal. Pupils are equal, round, and reactive to light. No scleral icterus.  Neck: Normal range of motion. Neck supple. Carotid bruit is not present. No thyromegaly present.  Cardiovascular: Normal rate, regular rhythm, normal heart sounds and intact distal pulses.   No murmur heard. Pulses:      Radial pulses are 2+ on the right side, and 2+ on the left side.  Pulmonary/Chest: Effort normal and breath sounds normal. No respiratory distress. She has no wheezes. She has no rales.  Breast - declined  Abdominal: Soft. Bowel sounds are normal. She exhibits no distension and no mass. There is no tenderness. There is no rebound and no guarding.  Genitourinary:  declined  Musculoskeletal: Normal range of motion. She exhibits no edema.  Lymphadenopathy:    She has no cervical adenopathy.  Neurological: She is alert and oriented to person, place, and time.  CN grossly intact, station and gait intact  Skin: Skin is warm and dry. No rash noted.  Psychiatric: She has a normal mood and affect. Her behavior is normal. Judgment and thought content normal.  Nursing note and vitals reviewed.  Results for orders placed or performed in visit on 03/14/16  HM DEXA SCAN  Result Value Ref Range   HM Dexa Scan per patient normal       Assessment & Plan:   Problem List Items Addressed This Visit    Hypothyroidism    Chronic, stable. refilled levothyroxine      Relevant Medications    levothyroxine (SYNTHROID, LEVOTHROID) 88 MCG tablet   Hypertension    Chronic, stable. Continue current regimen.      Relevant Medications   hydrochlorothiazide (MICROZIDE) 12.5 MG capsule   diltiazem (TIAZAC) 240 MG 24 hr capsule   lisinopril (PRINIVIL,ZESTRIL) 40 MG tablet   Hyperlipidemia    Chronic, stable. Continue statin.      Relevant Medications   hydrochlorothiazide (MICROZIDE) 12.5 MG capsule   diltiazem (TIAZAC) 240 MG 24 hr capsule   lisinopril (PRINIVIL,ZESTRIL) 40 MG tablet   Health maintenance examination - Primary    Preventative protocols reviewed and updated unless pt declined. Discussed healthy diet and lifestyle.       Depression    Stable period. Agrees to trial lower prozac - sent 20mg  dose to pharmacy. If stable ok to send lower dose to mail order.      Relevant Medications   FLUoxetine (PROZAC) 20 MG tablet   CKD (chronic kidney disease) stage 3, GFR 30-59 ml/min    Reviewed progressive decline over last 3 years but overall stable. Vit D normal. Further eval next visit. Discussed avoiding NSAIDs and increasing water intake      Advanced care planning/counseling discussion    Advanced  directives: has not set up. Would want daughter [name] to be HCPOA. Packet provided today and at [facility] visit.          Follow up plan: Return in about 6 months (around 09/14/2016), or as needed, for follow up visit.

## 2016-03-14 NOTE — Assessment & Plan Note (Signed)
Preventative protocols reviewed and updated unless pt declined. Discussed healthy diet and lifestyle.  

## 2016-03-19 ENCOUNTER — Other Ambulatory Visit: Payer: Self-pay

## 2016-03-19 DIAGNOSIS — E2839 Other primary ovarian failure: Secondary | ICD-10-CM

## 2016-03-21 ENCOUNTER — Encounter: Payer: Self-pay | Admitting: Family Medicine

## 2016-03-30 DIAGNOSIS — M810 Age-related osteoporosis without current pathological fracture: Secondary | ICD-10-CM | POA: Insufficient documentation

## 2016-03-30 HISTORY — DX: Age-related osteoporosis without current pathological fracture: M81.0

## 2016-03-30 HISTORY — PX: OTHER SURGICAL HISTORY: SHX169

## 2016-04-03 DIAGNOSIS — H524 Presbyopia: Secondary | ICD-10-CM | POA: Diagnosis not present

## 2016-04-05 DIAGNOSIS — Z780 Asymptomatic menopausal state: Secondary | ICD-10-CM | POA: Diagnosis not present

## 2016-04-05 DIAGNOSIS — M810 Age-related osteoporosis without current pathological fracture: Secondary | ICD-10-CM | POA: Diagnosis not present

## 2016-04-16 ENCOUNTER — Telehealth: Payer: Self-pay | Admitting: Family Medicine

## 2016-04-16 ENCOUNTER — Encounter: Payer: Self-pay | Admitting: Family Medicine

## 2016-04-16 NOTE — Telephone Encounter (Signed)
plz notify bone density scan returned showing osteoporosis. Continue calcium and vitamin D and regular weight bearing exercises.  Would have her consider starting bisphosphonate for bone strength - we may discuss at next visit if she desires.

## 2016-04-17 ENCOUNTER — Encounter: Payer: Self-pay | Admitting: *Deleted

## 2016-04-17 NOTE — Telephone Encounter (Signed)
Letter mailed notifying patient.

## 2016-04-22 ENCOUNTER — Encounter: Payer: Self-pay | Admitting: Family Medicine

## 2016-05-12 ENCOUNTER — Other Ambulatory Visit: Payer: Self-pay | Admitting: Family Medicine

## 2016-06-05 ENCOUNTER — Other Ambulatory Visit: Payer: Self-pay | Admitting: Family Medicine

## 2016-09-16 ENCOUNTER — Ambulatory Visit: Payer: Medicare Other | Admitting: Family Medicine

## 2016-09-23 ENCOUNTER — Encounter: Payer: Self-pay | Admitting: Family Medicine

## 2016-09-23 ENCOUNTER — Ambulatory Visit (INDEPENDENT_AMBULATORY_CARE_PROVIDER_SITE_OTHER): Payer: Medicare Other | Admitting: Family Medicine

## 2016-09-23 VITALS — BP 118/76 | HR 72 | Temp 98.6°F | Wt 229.2 lb

## 2016-09-23 DIAGNOSIS — Z23 Encounter for immunization: Secondary | ICD-10-CM

## 2016-09-23 DIAGNOSIS — M546 Pain in thoracic spine: Secondary | ICD-10-CM | POA: Insufficient documentation

## 2016-09-23 DIAGNOSIS — F32A Depression, unspecified: Secondary | ICD-10-CM

## 2016-09-23 DIAGNOSIS — I10 Essential (primary) hypertension: Secondary | ICD-10-CM | POA: Diagnosis not present

## 2016-09-23 DIAGNOSIS — F329 Major depressive disorder, single episode, unspecified: Secondary | ICD-10-CM

## 2016-09-23 DIAGNOSIS — N183 Chronic kidney disease, stage 3 unspecified: Secondary | ICD-10-CM

## 2016-09-23 DIAGNOSIS — M810 Age-related osteoporosis without current pathological fracture: Secondary | ICD-10-CM | POA: Diagnosis not present

## 2016-09-23 DIAGNOSIS — R202 Paresthesia of skin: Secondary | ICD-10-CM

## 2016-09-23 LAB — COMPREHENSIVE METABOLIC PANEL
ALBUMIN: 3.8 g/dL (ref 3.5–5.2)
ALK PHOS: 50 U/L (ref 39–117)
ALT: 11 U/L (ref 0–35)
AST: 18 U/L (ref 0–37)
BILIRUBIN TOTAL: 0.5 mg/dL (ref 0.2–1.2)
BUN: 24 mg/dL — ABNORMAL HIGH (ref 6–23)
CALCIUM: 10.2 mg/dL (ref 8.4–10.5)
CO2: 30 meq/L (ref 19–32)
Chloride: 104 mEq/L (ref 96–112)
Creatinine, Ser: 1.36 mg/dL — ABNORMAL HIGH (ref 0.40–1.20)
GFR: 39.34 mL/min — AB (ref >60.00)
Glucose, Bld: 81 mg/dL (ref 70–99)
Potassium: 4.1 mEq/L (ref 3.5–5.1)
Sodium: 140 mEq/L (ref 135–145)
Total Protein: 7.4 g/dL (ref 6.0–8.3)

## 2016-09-23 NOTE — Assessment & Plan Note (Signed)
Chronic, stable. Continue current regimen. 

## 2016-09-23 NOTE — Progress Notes (Signed)
BP 118/76   Pulse 72   Temp 98.6 F (37 C) (Oral)   Wt 229 lb 4 oz (104 kg)   BMI 40.61 kg/m    CC: 6 mo f/u visit Subjective:    Patient ID: [name], female    DOB: 04/08/1930, 82 y.o.   MRN: [id number]  HPI: [name] is a 82 y.o. female presenting on 09/23/2016 for Follow-up   .HTN - Compliant with current antihypertensive regimen of diltiazem, hctz, lisinopril. Does check blood pressures at home: well controlled. No low blood pressure readings or symptoms of dizziness/syncope. Denies vision changes, CP/tightness, SOB. Occasional headaches. Baseline pedal edema - wears support hose.   Recent osteoporosis dx 03/2016. Endorses worsening upper lumbar back pain over last month. Trouble with prolonged standing. Denies inciting trauma or injury.   HLD - stable on statin.   Some L handed finger numbness. Uses brace at night.   Depression - last visit we lowered prozac dose to 20mg  daily.   Relevant past medical, surgical, family and social history reviewed and updated as indicated. Interim medical history since our last visit reviewed. Allergies and medications reviewed and updated. Current Outpatient Prescriptions on File Prior to Visit  Medication Sig  . acetaminophen (TYLENOL) 500 MG tablet Take 1,000 mg by mouth daily as needed.   . Calcium Carbonate-Vitamin D (CALCIUM 600+D) 600-400 MG-UNIT tablet Take 1 tablet by mouth daily.  . cholecalciferol (VITAMIN D) 1000 UNITS tablet Take 1,000 Units by mouth daily.  Marland Kitchen diltiazem (TIAZAC) 240 MG 24 hr capsule Take 1 capsule (240 mg total) by mouth daily.  . diphenhydramine-acetaminophen (TYLENOL PM) 25-500 MG TABS Take 2 tablets by mouth at bedtime as needed.  Marland Kitchen FLUoxetine (PROZAC) 20 MG tablet TAKE 1 TABLET BY MOUTH EVERY DAY  . hydrochlorothiazide (MICROZIDE) 12.5 MG capsule Take 1 capsule (12.5 mg total) by mouth daily.  Marland Kitchen levothyroxine (SYNTHROID, LEVOTHROID) 88 MCG tablet Take 1 tablet (88 mcg total) by mouth daily before  breakfast.  . lisinopril (PRINIVIL,ZESTRIL) 40 MG tablet TAKE 1 BY MOUTH TWICE DAILY  . loratadine (CLARITIN) 10 MG tablet Take 10 mg by mouth daily.  Marland Kitchen LORazepam (ATIVAN) 1 MG tablet Take 0.5-1 tablets (0.5-1 mg total) by mouth 2 (two) times daily as needed for anxiety.  . pravastatin (PRAVACHOL) 40 MG tablet TAKE 1 TABLET BY MOUTH EVERY DAY  . ranitidine (ZANTAC) 150 MG tablet Take 150 mg by mouth at bedtime.   . vitamin B-12 (CYANOCOBALAMIN) 500 MCG tablet Take 1,000 mcg by mouth daily.    No current facility-administered medications on file prior to visit.     Review of Systems Per HPI unless specifically indicated in ROS section     Objective:    BP 118/76   Pulse 72   Temp 98.6 F (37 C) (Oral)   Wt 229 lb 4 oz (104 kg)   BMI 40.61 kg/m   Wt Readings from Last 3 Encounters:  09/23/16 229 lb 4 oz (104 kg)  03/14/16 231 lb (104.8 kg)  03/07/16 229 lb 8 oz (104.1 kg)    Physical Exam  Constitutional: She appears well-developed and well-nourished. No distress.  HENT:  Mouth/Throat: Oropharynx is clear and moist. No oropharyngeal exudate.  Eyes: Conjunctivae and EOM are normal. Pupils are equal, round, and reactive to light.  Neck: Normal range of motion. Neck supple. No thyromegaly present.  Cardiovascular: Normal rate, regular rhythm, normal heart sounds and intact distal pulses.   No murmur heard. Pulmonary/Chest: Effort  normal and breath sounds normal. No respiratory distress. She has no wheezes. She has no rales.  Musculoskeletal: She exhibits no edema.  Support hose in place Tender to palpation midline upper thoracic spine, no pain at paraspinous mm  Skin: Skin is warm. No rash noted.  Psychiatric: She has a normal mood and affect.  Nursing note and vitals reviewed.  Results for orders placed or performed in visit on 03/14/16  HM DEXA SCAN  Result Value Ref Range   HM Dexa Scan per patient normal    Lab Results  Component Value Date   CREATININE 1.21 (H)  03/07/2016       Assessment & Plan:   Problem List Items Addressed This Visit    CKD (chronic kidney disease) stage 3, GFR 30-59 ml/min    Progressive deterioration over last 2 yrs - will further evaluate today with labs and renal panel.       Relevant Orders   Comprehensive metabolic panel   Parathyroid hormone, intact (no Ca)   Serum protein electrophoresis with reflex   US Renal   Depression    Seems doing well on lower prozac dose.      Hypertension    Chronic, stable. Continue current regimen.       Left hand paresthesia    Anticipate CTS - rec continued wrist brace use.      Osteoporosis - Primary    New dx - discussed calcium/vit D as well as weight bearing exercises. Discussed possible bisphosphonate use, but will first evaluate kidneys further - see above.       Thoracic back pain    New x 1 month, with mild discomfort to midline palpation. If ongoing, consider xray.        Other Visit Diagnoses    Need for influenza vaccination       Relevant Orders   Flu Vaccine QUAD 36+ mos PF IM (Fluarix & Fluzone Quad PF) (Completed)       Follow up plan: Return in about 6 months (around 03/23/2017) for medicare wellness visit.  [name], MD

## 2016-09-23 NOTE — Assessment & Plan Note (Signed)
New x 1 month, with mild discomfort to midline palpation. If ongoing, consider xray.

## 2016-09-23 NOTE — Assessment & Plan Note (Addendum)
Anticipate CTS - rec continued wrist brace use.

## 2016-09-23 NOTE — Patient Instructions (Addendum)
Flu shot today. Increase water. Incorporate walking into your routine to keep bones strong.  Labs today to further evaluate kidney function. We will also order kidney ultrasound - see our referral coordinators up front.  We may discuss bone strengthening medicine, but first I want to make sure kidneys are ok.  Return in 6 months for medicare wellness visit, sooner if needed

## 2016-09-23 NOTE — Assessment & Plan Note (Signed)
Progressive deterioration over last 2 yrs - will further evaluate today with labs and renal panel.

## 2016-09-23 NOTE — Assessment & Plan Note (Signed)
New dx - discussed calcium/vit D as well as weight bearing exercises. Discussed possible bisphosphonate use, but will first evaluate kidneys further - see above.

## 2016-09-23 NOTE — Assessment & Plan Note (Signed)
Seems doing well on lower prozac dose.

## 2016-09-23 NOTE — Progress Notes (Signed)
Pre visit review using our clinic review tool, if applicable. No additional management support is needed unless otherwise documented below in the visit note. 

## 2016-09-24 LAB — PARATHYROID HORMONE, INTACT (NO CA): PTH: 41 pg/mL (ref 14–64)

## 2016-09-25 LAB — PROTEIN ELECTROPHORESIS, SERUM, WITH REFLEX
ABNORMAL PROTEIN BAND2: NOT DETECTED g/dL
ALPHA-1-GLOBULIN: 0.3 g/dL (ref 0.2–0.3)
ALPHA-2-GLOBULIN: 0.7 g/dL (ref 0.5–0.9)
Abnormal Protein Band3: NOT DETECTED g/dL
Albumin ELP: 3.7 g/dL — ABNORMAL LOW (ref 3.8–4.8)
Beta 2: 0.4 g/dL (ref 0.2–0.5)
Beta Globulin: 0.4 g/dL (ref 0.4–0.6)
GAMMA GLOBULIN: 1.3 g/dL (ref 0.8–1.7)
Total Protein, Serum Electrophoresis: 6.8 g/dL (ref 6.1–8.1)

## 2016-09-27 ENCOUNTER — Encounter: Payer: Self-pay | Admitting: *Deleted

## 2016-10-04 ENCOUNTER — Ambulatory Visit
Admission: RE | Admit: 2016-10-04 | Discharge: 2016-10-04 | Disposition: A | Discharge status: Home / Self Care | Payer: Medicare Other | Source: Ambulatory Visit | Attending: Family Medicine | Admitting: Family Medicine

## 2016-10-04 DIAGNOSIS — N183 Chronic kidney disease, stage 3 unspecified: Secondary | ICD-10-CM

## 2016-12-15 ENCOUNTER — Other Ambulatory Visit: Payer: Self-pay | Admitting: Family Medicine

## 2016-12-16 NOTE — Telephone Encounter (Signed)
Last refill 05/13/16 #30 + 6, last OV 09/23/16. Ok to refill?

## 2017-03-13 ENCOUNTER — Other Ambulatory Visit: Payer: Self-pay | Admitting: Family Medicine

## 2017-03-13 DIAGNOSIS — E538 Deficiency of other specified B group vitamins: Secondary | ICD-10-CM

## 2017-03-13 DIAGNOSIS — N183 Chronic kidney disease, stage 3 unspecified: Secondary | ICD-10-CM

## 2017-03-13 DIAGNOSIS — E785 Hyperlipidemia, unspecified: Secondary | ICD-10-CM

## 2017-03-13 DIAGNOSIS — E039 Hypothyroidism, unspecified: Secondary | ICD-10-CM

## 2017-03-13 DIAGNOSIS — M810 Age-related osteoporosis without current pathological fracture: Secondary | ICD-10-CM

## 2017-03-14 ENCOUNTER — Other Ambulatory Visit (INDEPENDENT_AMBULATORY_CARE_PROVIDER_SITE_OTHER): Payer: Medicare Other

## 2017-03-14 ENCOUNTER — Encounter (INDEPENDENT_AMBULATORY_CARE_PROVIDER_SITE_OTHER): Payer: Self-pay

## 2017-03-14 DIAGNOSIS — N183 Chronic kidney disease, stage 3 unspecified: Secondary | ICD-10-CM

## 2017-03-14 DIAGNOSIS — E785 Hyperlipidemia, unspecified: Secondary | ICD-10-CM | POA: Diagnosis not present

## 2017-03-14 DIAGNOSIS — M810 Age-related osteoporosis without current pathological fracture: Secondary | ICD-10-CM

## 2017-03-14 DIAGNOSIS — E538 Deficiency of other specified B group vitamins: Secondary | ICD-10-CM | POA: Diagnosis not present

## 2017-03-14 DIAGNOSIS — E039 Hypothyroidism, unspecified: Secondary | ICD-10-CM | POA: Diagnosis not present

## 2017-03-14 DIAGNOSIS — R7989 Other specified abnormal findings of blood chemistry: Secondary | ICD-10-CM

## 2017-03-14 LAB — VITAMIN D 25 HYDROXY (VIT D DEFICIENCY, FRACTURES): VITD: 45.93 ng/mL (ref 30.00–100.00)

## 2017-03-14 LAB — RENAL FUNCTION PANEL
Albumin: 4 g/dL (ref 3.5–5.2)
BUN: 18 mg/dL (ref 6–23)
CHLORIDE: 104 meq/L (ref 96–112)
CO2: 29 meq/L (ref 19–32)
Calcium: 10.5 mg/dL (ref 8.4–10.5)
Creatinine, Ser: 1.29 mg/dL — ABNORMAL HIGH (ref 0.40–1.20)
GFR: 41.77 mL/min — ABNORMAL LOW (ref >60.00)
Glucose, Bld: 89 mg/dL (ref 70–99)
PHOSPHORUS: 3.2 mg/dL (ref 2.3–4.6)
POTASSIUM: 4.1 meq/L (ref 3.5–5.1)
Sodium: 140 mEq/L (ref 135–145)

## 2017-03-14 LAB — CBC WITH DIFFERENTIAL/PLATELET
BASOS ABS: 0.1 10*3/uL (ref 0.0–0.1)
BASOS PCT: 1.2 % (ref 0.0–3.0)
EOS PCT: 3.6 % (ref 0.0–5.0)
Eosinophils Absolute: 0.2 10*3/uL (ref 0.0–0.7)
HEMATOCRIT: 38.4 % (ref 36.0–46.0)
Hemoglobin: 12.5 g/dL (ref 12.0–15.0)
LYMPHS ABS: 1.9 10*3/uL (ref 0.7–4.0)
LYMPHS PCT: 32 % (ref 12.0–46.0)
MCHC: 32.5 g/dL (ref 30.0–36.0)
MCV: 87 fl (ref 78.0–100.0)
MONOS PCT: 10.5 % (ref 3.0–12.0)
Monocytes Absolute: 0.6 10*3/uL (ref 0.1–1.0)
NEUTROS ABS: 3.1 10*3/uL (ref 1.4–7.7)
NEUTROS PCT: 52.7 % (ref 43.0–77.0)
PLATELETS: 240 10*3/uL (ref 150.0–400.0)
RBC: 4.41 Mil/uL (ref 3.87–5.11)
RDW: 13.9 % (ref 11.5–15.5)
WBC: 5.8 10*3/uL (ref 4.0–10.5)

## 2017-03-14 LAB — LIPID PANEL
CHOL/HDL RATIO: 3
Cholesterol: 175 mg/dL (ref 0–200)
HDL: 55.4 mg/dL (ref >39.00)
LDL CALC: 84 mg/dL (ref 0–99)
NonHDL: 119.56
TRIGLYCERIDES: 178 mg/dL — AB (ref 0.0–149.0)
VLDL: 35.6 mg/dL (ref 0.0–40.0)

## 2017-03-14 LAB — VITAMIN B12

## 2017-03-14 LAB — TSH: TSH: 2.53 u[IU]/mL (ref 0.35–4.50)

## 2017-03-18 ENCOUNTER — Other Ambulatory Visit: Payer: Self-pay | Admitting: *Deleted

## 2017-03-18 MED ORDER — LEVOTHYROXINE SODIUM 88 MCG PO TABS: 88.0000 ug | ORAL_TABLET | Freq: Every day | ORAL | 1 refills | Status: DC

## 2017-03-18 MED ORDER — HYDROCHLOROTHIAZIDE 12.5 MG PO CAPS: 12.5000 mg | ORAL_CAPSULE | Freq: Every day | ORAL | 1 refills | Status: DC

## 2017-03-18 MED ORDER — LISINOPRIL 40 MG PO TABS: ORAL_TABLET | ORAL | 1 refills | Status: DC

## 2017-03-19 ENCOUNTER — Ambulatory Visit (INDEPENDENT_AMBULATORY_CARE_PROVIDER_SITE_OTHER): Payer: Medicare Other | Admitting: Family Medicine

## 2017-03-19 ENCOUNTER — Encounter: Payer: Self-pay | Admitting: Family Medicine

## 2017-03-19 VITALS — BP 118/70 | HR 96 | Temp 98.0°F | Ht 64.0 in | Wt 228.5 lb

## 2017-03-19 DIAGNOSIS — Z0000 Encounter for general adult medical examination without abnormal findings: Secondary | ICD-10-CM | POA: Diagnosis not present

## 2017-03-19 DIAGNOSIS — M546 Pain in thoracic spine: Secondary | ICD-10-CM

## 2017-03-19 DIAGNOSIS — N183 Chronic kidney disease, stage 3 unspecified: Secondary | ICD-10-CM

## 2017-03-19 DIAGNOSIS — G8929 Other chronic pain: Secondary | ICD-10-CM

## 2017-03-19 DIAGNOSIS — G5602 Carpal tunnel syndrome, left upper limb: Secondary | ICD-10-CM

## 2017-03-19 DIAGNOSIS — I10 Essential (primary) hypertension: Secondary | ICD-10-CM

## 2017-03-19 DIAGNOSIS — F331 Major depressive disorder, recurrent, moderate: Secondary | ICD-10-CM

## 2017-03-19 DIAGNOSIS — Z7189 Other specified counseling: Secondary | ICD-10-CM

## 2017-03-19 DIAGNOSIS — E039 Hypothyroidism, unspecified: Secondary | ICD-10-CM

## 2017-03-19 DIAGNOSIS — E785 Hyperlipidemia, unspecified: Secondary | ICD-10-CM

## 2017-03-19 DIAGNOSIS — M810 Age-related osteoporosis without current pathological fracture: Secondary | ICD-10-CM

## 2017-03-19 MED ORDER — DILTIAZEM HCL ER BEADS 240 MG PO CP24: 240.0000 mg | ORAL_CAPSULE | Freq: Every day | ORAL | 3 refills | Status: DC

## 2017-03-19 MED ORDER — VITAMIN B-12 500 MCG PO TABS: 500.0000 ug | ORAL_TABLET | Freq: Every day | ORAL | Status: DC

## 2017-03-19 NOTE — Assessment & Plan Note (Signed)
Chronic, stable. Continue current regimen. 

## 2017-03-19 NOTE — Assessment & Plan Note (Addendum)
Chronic, stable. Continue pravastatin. Triglycerides mildly elevated.

## 2017-03-19 NOTE — Assessment & Plan Note (Signed)
Chronic, stable. Continue levothyroxine 58mcg daily.

## 2017-03-19 NOTE — Assessment & Plan Note (Signed)
Preventative protocols reviewed and updated unless pt declined. Discussed healthy diet and lifestyle.  

## 2017-03-19 NOTE — Assessment & Plan Note (Addendum)
Chronic, stable. Encouraged good hydration status. Pt aware to avoid NSAIDs and other nephrotoxic agents.

## 2017-03-19 NOTE — Assessment & Plan Note (Signed)

## 2017-03-19 NOTE — Progress Notes (Signed)
BP 118/70   Pulse 96   Temp 98 F (36.7 C) (Oral)   Ht 5\' 4"  (1.626 m)   Wt 228 lb 8 oz (103.6 kg)   BMI 39.22 kg/m    CC: CPE Subjective:    Patient ID: [name], female    DOB: Nov 27, 1930, 83 y.o.   MRN: [id number]  HPI: [name] is a 83 y.o. female presenting on 03/19/2017 for Annual Exam   Ongoing L hand numbness first 3 digits despite using brace at night. Some hand weakness. Wakes her up at night. ongoing for last 6 months. 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   No longer using lorazepam.  Some trouble swallowing steak. Has stopped eating this.   Hearing screen - wears hearing aides which help  Vision screen - with eye doctor No falls in last year  Denies depression, anhedonia, sadness. On prozac for last 10+ years   Preventative: Colonoscopy - had normal colonoscopy per pt years ago. Normal iFOB April 13, 2012. Has aged out. Mammogram - 10/2012, normal. Declines repeat, declines breast exam in office. Does do breast exams at home.  Well woman - aged out. Declines pelvic exam.  DEXA 03/2016 at [facility] - T -3.1 hip, -1.0 spine Flu - yearly Pneumovax - 09/2012. Prevnar - 04-13-2014 Tetanus - 04/13/09  Shingles shot - 12/2011 Advanced directives: has not set up. Would want daughter [name] to be HCPOA. Packet provided today and at [facility] visit. Doesn't want prolonged life support.  Seat belt use discussed Sunscreen use discussed. No changing moles on skin.  Non smoker Alcohol - none  Widower since 04/14/99 - husband battled cancer for 6 yrs, died from MI Daughter Jaquelinne Glendening) passed away 04/14/2015. Other daughter [name] nearby.  Lives in retirement home - [facility] on [address].  Occupation: retired  HS: 12th grade  Activity: no regular exercise  Diet: good water, fruits/vegetables daily  Relevant past medical, surgical, family and social history reviewed and updated as indicated. Interim medical history since our last visit reviewed. Allergies and medications reviewed and  updated. Outpatient Medications Prior to Visit  Medication Sig Dispense Refill  . acetaminophen (TYLENOL) 500 MG tablet Take 1,000 mg by mouth daily as needed.     . Calcium Carbonate-Vitamin D (CALCIUM 600+D) 600-400 MG-UNIT tablet Take 1 tablet by mouth daily.    . cholecalciferol (VITAMIN D) 1000 UNITS tablet Take 1,000 Units by mouth daily.    . diphenhydramine-acetaminophen (TYLENOL PM) 25-500 MG TABS Take 2 tablets by mouth at bedtime as needed.    Marland Kitchen FLUoxetine (PROZAC) 20 MG tablet TAKE 1 TABLET BY MOUTH EVERY DAY 30 tablet 6  . hydrochlorothiazide (MICROZIDE) 12.5 MG capsule Take 1 capsule (12.5 mg total) by mouth daily. 90 capsule 1  . levothyroxine (SYNTHROID, LEVOTHROID) 88 MCG tablet Take 1 tablet (88 mcg total) by mouth daily before breakfast. 90 tablet 1  . lisinopril (PRINIVIL,ZESTRIL) 40 MG tablet TAKE 1 BY MOUTH TWICE DAILY 180 tablet 1  . loratadine (CLARITIN) 10 MG tablet Take 10 mg by mouth daily.    . pravastatin (PRAVACHOL) 40 MG tablet TAKE 1 TABLET BY MOUTH EVERY DAY 90 tablet 3  . ranitidine (ZANTAC) 150 MG tablet Take 150 mg by mouth at bedtime.     Marland Kitchen diltiazem (TIAZAC) 240 MG 24 hr capsule Take 1 capsule (240 mg total) by mouth daily. 90 capsule 3  . vitamin B-12 (CYANOCOBALAMIN) 500 MCG tablet Take 1,000 mcg by mouth daily.     Marland Kitchen LORazepam (  ATIVAN) 1 MG tablet Take 0.5-1 tablets (0.5-1 mg total) by mouth 2 (two) times daily as needed for anxiety. (Patient not taking: Reported on 03/19/2017) 20 tablet 0   No facility-administered medications prior to visit.      Per HPI unless specifically indicated in ROS section below Review of Systems  Constitutional: Positive for appetite change. Negative for activity change, chills, fatigue, fever and unexpected weight change.  HENT: Negative for hearing loss.   Eyes: Negative for visual disturbance.  Respiratory: Negative for cough, chest tightness, shortness of breath and wheezing.   Cardiovascular: Positive for leg  swelling. Negative for chest pain and palpitations.  Gastrointestinal: Negative for abdominal distention, abdominal pain, blood in stool, constipation, diarrhea, nausea and vomiting.  Genitourinary: Negative for difficulty urinating and hematuria.  Musculoskeletal: Negative for arthralgias, myalgias and neck pain.  Skin: Negative for rash.  Neurological: Negative for dizziness, seizures, syncope and headaches.  Hematological: Negative for adenopathy. Does not bruise/bleed easily.  Psychiatric/Behavioral: Negative for dysphoric mood. The patient is not nervous/anxious.       Objective:    BP 118/70   Pulse 96   Temp 98 F (36.7 C) (Oral)   Ht 5\' 4"  (1.626 m)   Wt 228 lb 8 oz (103.6 kg)   BMI 39.22 kg/m   Wt Readings from Last 3 Encounters:  03/19/17 228 lb 8 oz (103.6 kg)  09/23/16 229 lb 4 oz (104 kg)  03/14/16 231 lb (104.8 kg)    Physical Exam  Constitutional: She is oriented to person, place, and time. She appears well-developed and well-nourished. No distress.  HENT:  Head: Normocephalic and atraumatic.  Right Ear: Tympanic membrane, external ear and ear canal normal. Decreased hearing is noted.  Left Ear: Tympanic membrane, external ear and ear canal normal. Decreased hearing is noted.  Nose: Nose normal.  Mouth/Throat: Uvula is midline, oropharynx is clear and moist and mucous membranes are normal. No oropharyngeal exudate, posterior oropharyngeal edema or posterior oropharyngeal erythema.  Hard of hearing despite aides  Eyes: Conjunctivae and EOM are normal. Pupils are equal, round, and reactive to light. No scleral icterus.  Neck: Normal range of motion. Neck supple. Carotid bruit is not present. No thyromegaly present.  Cardiovascular: Normal rate, regular rhythm, normal heart sounds and intact distal pulses.   No murmur heard. Pulses:      Radial pulses are 2+ on the right side, and 2+ on the left side.  Pulmonary/Chest: Effort normal and breath sounds normal. No  respiratory distress. She has no wheezes. She has no rales.  Abdominal: Soft. Bowel sounds are normal. She exhibits no distension and no mass. There is no tenderness. There is no rebound and no guarding.  Musculoskeletal: Normal range of motion. She exhibits no edema.  Lymphadenopathy:    She has no cervical adenopathy.  Neurological: She is alert and oriented to person, place, and time.  CN grossly intact, station and gait intact Recall 2/3 Calculation 4/5 serial 3s  Skin: Skin is warm and dry. No rash noted.  Psychiatric: She has a normal mood and affect. Her behavior is normal. Judgment and thought content normal.  Nursing note and vitals reviewed.  Results for orders placed or performed in visit on 03/14/17  Renal function panel  Result Value Ref Range   Sodium 140 135 - 145 mEq/L   Potassium 4.1 3.5 - 5.1 mEq/L   Chloride 104 96 - 112 mEq/L   CO2 29 19 - 32 mEq/L   Calcium 10.5 8.4 -  10.5 mg/dL   Albumin 4.0 3.5 - 5.2 g/dL   BUN 18 6 - 23 mg/dL   Creatinine, Ser 1.29 (H) 0.40 - 1.20 mg/dL   Glucose, Bld 89 70 - 99 mg/dL   Phosphorus 3.2 2.3 - 4.6 mg/dL   GFR 41.77 (L) >60.00 mL/min  Lipid panel  Result Value Ref Range   Cholesterol 175 0 - 200 mg/dL   Triglycerides 178.0 (H) 0.0 - 149.0 mg/dL   HDL 55.40 >39.00 mg/dL   VLDL 35.6 0.0 - 40.0 mg/dL   LDL Cholesterol 84 0 - 99 mg/dL   Total CHOL/HDL Ratio 3    NonHDL 119.56   TSH  Result Value Ref Range   TSH 2.53 0.35 - 4.50 uIU/mL  Vitamin B12  Result Value Ref Range   Vitamin B-12 >1500 (H) 211 - 911 pg/mL  VITAMIN D 25 Hydroxy (Vit-D Deficiency, Fractures)  Result Value Ref Range   VITD 45.93 30.00 - 100.00 ng/mL  CBC with Differential/Platelet  Result Value Ref Range   WBC 5.8 4.0 - 10.5 K/uL   RBC 4.41 3.87 - 5.11 Mil/uL   Hemoglobin 12.5 12.0 - 15.0 g/dL   HCT 38.4 36.0 - 46.0 %   MCV 87.0 78.0 - 100.0 fl   MCHC 32.5 30.0 - 36.0 g/dL   RDW 13.9 11.5 - 15.5 %   Platelets 240.0 150.0 - 400.0 K/uL    Neutrophils Relative % 52.7 43.0 - 77.0 %   Lymphocytes Relative 32.0 12.0 - 46.0 %   Monocytes Relative 10.5 3.0 - 12.0 %   Eosinophils Relative 3.6 0.0 - 5.0 %   Basophils Relative 1.2 0.0 - 3.0 %   Neutro Abs 3.1 1.4 - 7.7 K/uL   Lymphs Abs 1.9 0.7 - 4.0 K/uL   Monocytes Absolute 0.6 0.1 - 1.0 K/uL   Eosinophils Absolute 0.2 0.0 - 0.7 K/uL   Basophils Absolute 0.1 0.0 - 0.1 K/uL      Assessment & Plan:   Problem List Items Addressed This Visit    Advanced care planning/counseling discussion    Advanced directives: has not set up. Would want daughter [name] to be HCPOA. Packet provided today and at [facility] visit. Doesn't want prolonged life support.       CKD (chronic kidney disease) stage 3, GFR 30-59 ml/min    Chronic, stable. Encouraged good hydration status. Pt aware to avoid NSAIDs and other nephrotoxic agents.       CTS (carpal tunnel syndrome)    Clinically consistent with CTS on left ongoing over last 6 months - not improved despite nightly wrist brace use. Will refer to hand surgery for further evaluation. Avoid NSAIDs given CKD.       Relevant Orders   Ambulatory referral to Hand Surgery   Health maintenance examination    Preventative protocols reviewed and updated unless pt declined. Discussed healthy diet and lifestyle.       Hyperlipidemia    Chronic, stable. Continue pravastatin. Triglycerides mildly elevated.       Relevant Medications   diltiazem (TIAZAC) 240 MG 24 hr capsule   Hypertension    Chronic, stable. Continue current regimen.       Relevant Medications   diltiazem (TIAZAC) 240 MG 24 hr capsule   Hypothyroidism    Chronic, stable. Continue levothyroxine 5mcg daily.       MDD (major depressive disorder), recurrent episode, moderate (HCC)    Continue prozac 20mg  daily. Pt not taking lorazepam - will remove from med  list.       Medicare annual wellness visit, subsequent - Primary    I have personally reviewed the Medicare Annual  Wellness questionnaire and have noted 1. The patient's medical and social history 2. Their use of alcohol, tobacco or illicit drugs 3. Their current medications and supplements 4. The patient's functional ability including ADL's, fall risks, home safety risks and hearing or visual impairment. Cognitive function has been assessed and addressed as indicated.  5. Diet and physical activity 6. Evidence for depression or mood disorders The patients weight, height, BMI have been recorded in the chart. I have made referrals, counseling and provided education to the patient based on review of the above and I have provided the pt with a written personalized care plan for preventive services. Provider list updated.. See scanned questionairre as needed for further documentation. Reviewed preventative protocols and updated unless pt declined.       Osteoporosis    Reviewed with patient. Would avoid bisphosphonates given CKD and h/o PUD Will start process to prescribe prolia and see if affordable.       Thoracic back pain    Uses brace at home which helps. Declines xray today.           Follow up plan: Return in about 6 months (around 09/19/2017) for follow up visit.  [name], MD

## 2017-03-19 NOTE — Assessment & Plan Note (Signed)
Advanced directives: has not set up. Would want daughter [name] to be HCPOA. Packet provided today and at [facility] visit. Doesn't want prolonged life support.

## 2017-03-19 NOTE — Assessment & Plan Note (Signed)
Clinically consistent with CTS on left ongoing over last 6 months - not improved despite nightly wrist brace use. Will refer to hand surgery for further evaluation. Avoid NSAIDs given CKD.

## 2017-03-19 NOTE — Assessment & Plan Note (Addendum)
Reviewed with patient. Would avoid bisphosphonates given CKD and h/o PUD Will start process to prescribe prolia and see if affordable.

## 2017-03-19 NOTE — Patient Instructions (Addendum)
We will refer you to hand doctor to evaluate left carpal tunnel syndrome.  Keep working on advanced directive and bring me a copy.  We will sign you up for prolia and see if insurance will cover this.  Decrease vitamin b12 to 535mg daily.  Return as needed or in 6 months for follow up visit.   Health Maintenance, Female Adopting a healthy lifestyle and getting preventive care can go a long way to promote health and wellness. Talk with your health care provider about what schedule of regular examinations is right for you. This is a good chance for you to check in with your provider about disease prevention and staying healthy. In between checkups, there are plenty of things you can do on your own. Experts have done a lot of research about which lifestyle changes and preventive measures are most likely to keep you healthy. Ask your health care provider for more information. Weight and diet Eat a healthy diet  Be sure to include plenty of vegetables, fruits, low-fat dairy products, and lean protein.  Do not eat a lot of foods high in solid fats, added sugars, or salt.  Get regular exercise. This is one of the most important things you can do for your health.  Most adults should exercise for at least 150 minutes each week. The exercise should increase your heart rate and make you sweat (moderate-intensity exercise).  Most adults should also do strengthening exercises at least twice a week. This is in addition to the moderate-intensity exercise. Maintain a healthy weight  Body mass index (BMI) is a measurement that can be used to identify possible weight problems. It estimates body fat based on height and weight. Your health care provider can help determine your BMI and help you achieve or maintain a healthy weight.  For females 243years of age and older:  A BMI below 18.5 is considered underweight.  A BMI of 18.5 to 24.9 is normal.  A BMI of 25 to 29.9 is considered overweight.  A BMI  of 30 and above is considered obese. Watch levels of cholesterol and blood lipids  You should start having your blood tested for lipids and cholesterol at 81years of age, then have this test every 5 years.  You may need to have your cholesterol levels checked more often if:  Your lipid or cholesterol levels are high.  You are older than 81years of age.  You are at high risk for heart disease. Cancer screening Lung Cancer  Lung cancer screening is recommended for adults 56682years old who are at high risk for lung cancer because of a history of smoking.  A yearly low-dose CT scan of the lungs is recommended for people who:  Currently smoke.  Have quit within the past 15 years.  Have at least a 30-pack-year history of smoking. A pack year is smoking an average of one pack of cigarettes a day for 1 year.  Yearly screening should continue until it has been 15 years since you quit.  Yearly screening should stop if you develop a health problem that would prevent you from having lung cancer treatment. Breast Cancer  Practice breast self-awareness. This means understanding how your breasts normally appear and feel.  It also means doing regular breast self-exams. Let your health care provider know about any changes, no matter how small.  If you are in your 20s or 30s, you should have a clinical breast exam (CBE) by a health care provider every  1-3 years as part of a regular health exam.  If you are 74 or older, have a CBE every year. Also consider having a breast X-ray (mammogram) every year.  If you have a family history of breast cancer, talk to your health care provider about genetic screening.  If you are at high risk for breast cancer, talk to your health care provider about having an MRI and a mammogram every year.  Breast cancer gene (BRCA) assessment is recommended for women who have family members with BRCA-related cancers. BRCA-related cancers  include:  Breast.  Ovarian.  Tubal.  Peritoneal cancers.  Results of the assessment will determine the need for genetic counseling and BRCA1 and BRCA2 testing. Cervical Cancer  Your health care provider may recommend that you be screened regularly for cancer of the pelvic organs (ovaries, uterus, and vagina). This screening involves a pelvic examination, including checking for microscopic changes to the surface of your cervix (Pap test). You may be encouraged to have this screening done every 3 years, beginning at age 27.  For women ages 10-65, health care providers may recommend pelvic exams and Pap testing every 3 years, or they may recommend the Pap and pelvic exam, combined with testing for human papilloma virus (HPV), every 5 years. Some types of HPV increase your risk of cervical cancer. Testing for HPV may also be done on women of any age with unclear Pap test results.  Other health care providers may not recommend any screening for nonpregnant women who are considered low risk for pelvic cancer and who do not have symptoms. Ask your health care provider if a screening pelvic exam is right for you.  If you have had past treatment for cervical cancer or a condition that could lead to cancer, you need Pap tests and screening for cancer for at least 20 years after your treatment. If Pap tests have been discontinued, your risk factors (such as having a new sexual partner) need to be reassessed to determine if screening should resume. Some women have medical problems that increase the chance of getting cervical cancer. In these cases, your health care provider may recommend more frequent screening and Pap tests. Colorectal Cancer  This type of cancer can be detected and often prevented.  Routine colorectal cancer screening usually begins at 81 years of age and continues through 81 years of age.  Your health care provider may recommend screening at an earlier age if you have risk factors  for colon cancer.  Your health care provider may also recommend using home test kits to check for hidden blood in the stool.  A small camera at the end of a tube can be used to examine your colon directly (sigmoidoscopy or colonoscopy). This is done to check for the earliest forms of colorectal cancer.  Routine screening usually begins at age 83.  Direct examination of the colon should be repeated every 5-10 years through 81 years of age. However, you may need to be screened more often if early forms of precancerous polyps or small growths are found. Skin Cancer  Check your skin from head to toe regularly.  Tell your health care provider about any new moles or changes in moles, especially if there is a change in a mole's shape or color.  Also tell your health care provider if you have a mole that is larger than the size of a pencil eraser.  Always use sunscreen. Apply sunscreen liberally and repeatedly throughout the day.  Protect yourself by  wearing long sleeves, pants, a wide-brimmed hat, and sunglasses whenever you are outside. Heart disease, diabetes, and high blood pressure  High blood pressure causes heart disease and increases the risk of stroke. High blood pressure is more likely to develop in:  People who have blood pressure in the high end of the normal range (130-139/85-89 mm Hg).  People who are overweight or obese.  People who are African American.  If you are 59-33 years of age, have your blood pressure checked every 3-5 years. If you are 11 years of age or older, have your blood pressure checked every year. You should have your blood pressure measured twice-once when you are at a hospital or clinic, and once when you are not at a hospital or clinic. Record the average of the two measurements. To check your blood pressure when you are not at a hospital or clinic, you can use:  An automated blood pressure machine at a pharmacy.  A home blood pressure monitor.  If you  are between 85 years and 70 years old, ask your health care provider if you should take aspirin to prevent strokes.  Have regular diabetes screenings. This involves taking a blood sample to check your fasting blood sugar level.  If you are at a normal weight and have a low risk for diabetes, have this test once every three years after 81 years of age.  If you are overweight and have a high risk for diabetes, consider being tested at a younger age or more often. Preventing infection Hepatitis B  If you have a higher risk for hepatitis B, you should be screened for this virus. You are considered at high risk for hepatitis B if:  You were born in a country where hepatitis B is common. Ask your health care provider which countries are considered high risk.  Your parents were born in a high-risk country, and you have not been immunized against hepatitis B (hepatitis B vaccine).  You have HIV or AIDS.  You use needles to inject street drugs.  You live with someone who has hepatitis B.  You have had sex with someone who has hepatitis B.  You get hemodialysis treatment.  You take certain medicines for conditions, including cancer, organ transplantation, and autoimmune conditions. Hepatitis C  Blood testing is recommended for:  Everyone born from [birth year] through 1963.  Anyone with known risk factors for hepatitis C. Sexually transmitted infections (STIs)  You should be screened for sexually transmitted infections (STIs) including gonorrhea and chlamydia if:  You are sexually active and are younger than 81 years of age.  You are older than 81 years of age and your health care provider tells you that you are at risk for this type of infection.  Your sexual activity has changed since you were last screened and you are at an increased risk for chlamydia or gonorrhea. Ask your health care provider if you are at risk.  If you do not have HIV, but are at risk, it may be recommended that you  take a prescription medicine daily to prevent HIV infection. This is called pre-exposure prophylaxis (PrEP). You are considered at risk if:  You are sexually active and do not regularly use condoms or know the HIV status of your partner(s).  You take drugs by injection.  You are sexually active with a partner who has HIV. Talk with your health care provider about whether you are at high risk of being infected with HIV. If you choose  to begin PrEP, you should first be tested for HIV. You should then be tested every 3 months for as long as you are taking PrEP. Pregnancy  If you are premenopausal and you may become pregnant, ask your health care provider about preconception counseling.  If you may become pregnant, take 400 to 800 micrograms (mcg) of folic acid every day.  If you want to prevent pregnancy, talk to your health care provider about birth control (contraception). Osteoporosis and menopause  Osteoporosis is a disease in which the bones lose minerals and strength with aging. This can result in serious bone fractures. Your risk for osteoporosis can be identified using a bone density scan.  If you are 41 years of age or older, or if you are at risk for osteoporosis and fractures, ask your health care provider if you should be screened.  Ask your health care provider whether you should take a calcium or vitamin D supplement to lower your risk for osteoporosis.  Menopause may have certain physical symptoms and risks.  Hormone replacement therapy may reduce some of these symptoms and risks. Talk to your health care provider about whether hormone replacement therapy is right for you. Follow these instructions at home:  Schedule regular health, dental, and eye exams.  Stay current with your immunizations.  Do not use any tobacco products including cigarettes, chewing tobacco, or electronic cigarettes.  If you are pregnant, do not drink alcohol.  If you are breastfeeding, limit  how much and how often you drink alcohol.  Limit alcohol intake to no more than 1 drink per day for nonpregnant women. One drink equals 12 ounces of beer, 5 ounces of wine, or 1 ounces of hard liquor.  Do not use street drugs.  Do not share needles.  Ask your health care provider for help if you need support or information about quitting drugs.  Tell your health care provider if you often feel depressed.  Tell your health care provider if you have ever been abused or do not feel safe at home. This information is not intended to replace advice given to you by your health care provider. Make sure you discuss any questions you have with your health care provider. Document Released: 07/01/2011 Document Revised: 05/23/2016 Document Reviewed: 09/19/2015 Elsevier Interactive Patient Education  2017 [employer].

## 2017-03-19 NOTE — Progress Notes (Signed)
Pre visit review using our clinic review tool, if applicable. No additional management support is needed unless otherwise documented below in the visit note. 

## 2017-03-19 NOTE — Assessment & Plan Note (Signed)
Continue prozac 20mg  daily. Pt not taking lorazepam - will remove from med list.

## 2017-03-19 NOTE — Assessment & Plan Note (Signed)
Uses brace at home which helps. Declines xray today.

## 2017-03-26 ENCOUNTER — Telehealth: Payer: Self-pay | Admitting: *Deleted

## 2017-03-26 NOTE — Telephone Encounter (Signed)
Information has been submitted to pts insurance for verification of benefits. Awaiting response for coverage  

## 2017-04-03 DIAGNOSIS — G5602 Carpal tunnel syndrome, left upper limb: Secondary | ICD-10-CM | POA: Diagnosis not present

## 2017-04-15 DIAGNOSIS — G5602 Carpal tunnel syndrome, left upper limb: Secondary | ICD-10-CM | POA: Diagnosis not present

## 2017-04-28 DIAGNOSIS — G5602 Carpal tunnel syndrome, left upper limb: Secondary | ICD-10-CM | POA: Diagnosis not present

## 2017-04-28 HISTORY — PX: CARPAL TUNNEL RELEASE: SHX101

## 2017-05-09 NOTE — Telephone Encounter (Signed)
Form received indicating PA required. Form completed and waiting response of approval/denial.

## 2017-05-09 NOTE — Telephone Encounter (Signed)
[place] with [employer] left v/m with denial for prolia PA; this was a detailed message that I did not understand all medicines listed why prolia was denied; because of this I tranferred the v/m to your phone so you could hear the entire message.

## 2017-05-15 ENCOUNTER — Telehealth: Payer: Self-pay | Admitting: Family Medicine

## 2017-05-15 NOTE — Telephone Encounter (Signed)
Unable to understand medications on pts formulary via vm. Letter received indicating PA denied stating that prolia is approved only if pt is unable to take bisphosphonate. Per the PA form faxed to the appeals dept, it indicates that the pt is unable to take bisphosphonates due to CKD and h/o PUD; information obtained directly from pts chart per Dr Mauricia Area to Cephus Richer at [facility] who states she will have the appeals dept contact us back within 2 business days.

## 2017-05-15 NOTE — Telephone Encounter (Signed)
Spoke to [place] with [employer] who states the appeal is currently in process and will take appx 72hrs for a decision

## 2017-05-15 NOTE — Telephone Encounter (Signed)
[employer] called saying the appeal requested has been overturned and they will cover the prolia. Will fax in forms.

## 2017-05-16 NOTE — Telephone Encounter (Signed)
See additional phone note. 

## 2017-05-16 NOTE — Telephone Encounter (Signed)
Spoke to pt who is agreeable to out of pocket costs. Ca lab scheduled.

## 2017-05-16 NOTE — Telephone Encounter (Signed)
Fax received indicating prolia approved and valid 05/09/17-5/10-2018.   Verification of benefits have been processed and an approval has been received for pts prolia injection. Pts estimated cost are appx $220. This is only an estimate and cannot be confirmed until benefits are paid. Please advise pt and schedule if needed. If scheduled, once the injection is received, pls contact me back with the date it was received so that I am able to update prolia folder. thanks

## 2017-05-16 NOTE — Telephone Encounter (Signed)
Attempted to contact pt; unable to leave vm 

## 2017-05-19 ENCOUNTER — Other Ambulatory Visit (INDEPENDENT_AMBULATORY_CARE_PROVIDER_SITE_OTHER): Payer: Medicare Other

## 2017-05-19 ENCOUNTER — Other Ambulatory Visit: Payer: Medicare Other | Admitting: Family Medicine

## 2017-05-19 DIAGNOSIS — M810 Age-related osteoporosis without current pathological fracture: Secondary | ICD-10-CM

## 2017-05-19 LAB — CALCIUM: CALCIUM: 10.4 mg/dL (ref 8.4–10.5)

## 2017-06-03 ENCOUNTER — Other Ambulatory Visit: Payer: Self-pay | Admitting: Family Medicine

## 2017-06-04 ENCOUNTER — Telehealth: Payer: Self-pay | Admitting: Family Medicine

## 2017-06-04 NOTE — Telephone Encounter (Signed)
Ca lab normal. Prolia inj sched for 6/7

## 2017-06-04 NOTE — Telephone Encounter (Signed)
Pt called to get results from 05/19/17 labs. She is requesting a cb.

## 2017-06-05 ENCOUNTER — Ambulatory Visit (INDEPENDENT_AMBULATORY_CARE_PROVIDER_SITE_OTHER): Payer: Medicare Other | Admitting: *Deleted

## 2017-06-05 DIAGNOSIS — M810 Age-related osteoporosis without current pathological fracture: Secondary | ICD-10-CM | POA: Diagnosis not present

## 2017-06-05 MED ORDER — DENOSUMAB 60 MG/ML ~~LOC~~ SOLN
60.0000 mg | Freq: Once | SUBCUTANEOUS | Status: AC
  Administered 2017-06-05: 60 mg via SUBCUTANEOUS

## 2017-06-05 NOTE — Telephone Encounter (Signed)
plz notify it was just a calcium level in preparation for prolia injection and calcium level was normal.

## 2017-06-05 NOTE — Telephone Encounter (Signed)
I had previously spoken with pt on 6/5 and advised her of lab results and prolia injection was scheduled for 6/7 @10 

## 2017-06-26 ENCOUNTER — Other Ambulatory Visit: Payer: Self-pay | Admitting: Family Medicine

## 2017-07-03 DIAGNOSIS — H524 Presbyopia: Secondary | ICD-10-CM | POA: Diagnosis not present

## 2017-09-19 ENCOUNTER — Ambulatory Visit (INDEPENDENT_AMBULATORY_CARE_PROVIDER_SITE_OTHER): Payer: Medicare Other | Admitting: Family Medicine

## 2017-09-19 ENCOUNTER — Encounter (INDEPENDENT_AMBULATORY_CARE_PROVIDER_SITE_OTHER): Payer: Self-pay

## 2017-09-19 ENCOUNTER — Encounter: Payer: Self-pay | Admitting: Family Medicine

## 2017-09-19 VITALS — BP 150/72 | HR 75 | Temp 98.2°F | Wt 227.5 lb

## 2017-09-19 DIAGNOSIS — Z23 Encounter for immunization: Secondary | ICD-10-CM

## 2017-09-19 DIAGNOSIS — E039 Hypothyroidism, unspecified: Secondary | ICD-10-CM

## 2017-09-19 DIAGNOSIS — M810 Age-related osteoporosis without current pathological fracture: Secondary | ICD-10-CM | POA: Diagnosis not present

## 2017-09-19 DIAGNOSIS — N183 Chronic kidney disease, stage 3 unspecified: Secondary | ICD-10-CM

## 2017-09-19 DIAGNOSIS — I10 Essential (primary) hypertension: Secondary | ICD-10-CM

## 2017-09-19 LAB — RENAL FUNCTION PANEL
ALBUMIN: 4 g/dL (ref 3.5–5.2)
BUN: 18 mg/dL (ref 6–23)
CHLORIDE: 104 meq/L (ref 96–112)
CO2: 30 meq/L (ref 19–32)
Calcium: 10.9 mg/dL — ABNORMAL HIGH (ref 8.4–10.5)
Creatinine, Ser: 1.22 mg/dL — ABNORMAL HIGH (ref 0.40–1.20)
GFR: 44.49 mL/min — ABNORMAL LOW (ref >60.00)
Glucose, Bld: 88 mg/dL (ref 70–99)
POTASSIUM: 4.3 meq/L (ref 3.5–5.1)
Phosphorus: 3.2 mg/dL (ref 2.3–4.6)
SODIUM: 139 meq/L (ref 135–145)

## 2017-09-19 MED ORDER — HYDROCHLOROTHIAZIDE 12.5 MG PO CAPS: 12.5000 mg | ORAL_CAPSULE | Freq: Every day | ORAL | 3 refills | Status: DC

## 2017-09-19 MED ORDER — LISINOPRIL 40 MG PO TABS: ORAL_TABLET | ORAL | 3 refills | Status: DC

## 2017-09-19 MED ORDER — DILTIAZEM HCL ER BEADS 240 MG PO CP24: 240.0000 mg | ORAL_CAPSULE | Freq: Every day | ORAL | 3 refills | Status: DC

## 2017-09-19 MED ORDER — FLUOXETINE HCL 20 MG PO TABS: 20.0000 mg | ORAL_TABLET | Freq: Every day | ORAL | 3 refills | Status: DC

## 2017-09-19 MED ORDER — LEVOTHYROXINE SODIUM 88 MCG PO TABS: 88.0000 ug | ORAL_TABLET | Freq: Every day | ORAL | 3 refills | Status: DC

## 2017-09-19 NOTE — Assessment & Plan Note (Addendum)
Chronic, uncontrolled. Will increase HCTZ to 25mg  daily. Continue other meds.

## 2017-09-19 NOTE — Assessment & Plan Note (Signed)
Update labs today. Will increase hctz to 25mg  daily.

## 2017-09-19 NOTE — Assessment & Plan Note (Signed)
Chronic, stable. Continue current regimen. 

## 2017-09-19 NOTE — Patient Instructions (Addendum)
Flu shot today Labs today. Increase hydrochlorothiazide to 25mg  daily (new dose at pharmacy) Continue other blood pressure medicines. Make sure you stay well hydrated on this medicine.

## 2017-09-19 NOTE — Progress Notes (Signed)
BP (!) 150/72 (BP Location: Right Arm, Cuff Size: Large)   Pulse 75   Temp 98.2 F (36.8 C) (Oral)   Wt 227 lb 8 oz (103.2 kg)   SpO2 96%   BMI 39.05 kg/m    CC: 6 mo f/u visit Subjective:    Patient ID: [name], female    DOB: 11/13/1930, 83 y.o.   MRN: [id number]  HPI: [name] is a 83 y.o. female presenting on 09/19/2017 for 6 mo follow-up   Left handed. S/p L CTS release [facility]) recovered well. Released from hand surgeon.   HTN - Compliant with current antihypertensive regimen of diltiazem 240mg  daily, hctz 12.5mg  daily, lisinopril 40mg  daily. Does check blood pressures at home: 152/84. No low blood pressure readings or symptoms of dizziness/syncope. Denies HA, vision changes, CP/tightness, SOB, leg swelling.   Hypothyroidism - compliant with levothyroxine 23mcg daily. No hyper or hypothyroid symptoms.  Osteoporosis - prolia started 05/2017.  Ongoing lumbar spine pain.   Relevant past medical, surgical, family and social history reviewed and updated as indicated. Interim medical history since our last visit reviewed. Allergies and medications reviewed and updated. Outpatient Medications Prior to Visit  Medication Sig Dispense Refill  . acetaminophen (TYLENOL) 500 MG tablet Take 1,000 mg by mouth daily as needed.     . Calcium Carbonate-Vitamin D (CALCIUM 600+D) 600-400 MG-UNIT tablet Take 1 tablet by mouth daily.    . cholecalciferol (VITAMIN D) 1000 UNITS tablet Take 1,000 Units by mouth daily.    . diphenhydramine-acetaminophen (TYLENOL PM) 25-500 MG TABS Take 2 tablets by mouth at bedtime as needed.    . loratadine (CLARITIN) 10 MG tablet Take 10 mg by mouth daily.    . pravastatin (PRAVACHOL) 40 MG tablet TAKE 1 TABLET BY MOUTH EVERY DAY 90 tablet 2  . ranitidine (ZANTAC) 150 MG tablet Take 150 mg by mouth at bedtime.     . vitamin B-12 (CYANOCOBALAMIN) 500 MCG tablet Take 1 tablet (500 mcg total) by mouth daily.    Marland Kitchen diltiazem (TIAZAC) 240 MG 24 hr  capsule Take 1 capsule (240 mg total) by mouth daily. 90 capsule 3  . FLUoxetine (PROZAC) 20 MG tablet TAKE 1 TABLET BY MOUTH EVERY DAY 30 tablet 3  . hydrochlorothiazide (MICROZIDE) 12.5 MG capsule Take 1 capsule (12.5 mg total) by mouth daily. 90 capsule 1  . levothyroxine (SYNTHROID, LEVOTHROID) 88 MCG tablet Take 1 tablet (88 mcg total) by mouth daily before breakfast. 90 tablet 1  . lisinopril (PRINIVIL,ZESTRIL) 40 MG tablet TAKE 1 BY MOUTH TWICE DAILY 180 tablet 1   No facility-administered medications prior to visit.      Per HPI unless specifically indicated in ROS section below Review of Systems     Objective:    BP (!) 150/72 (BP Location: Right Arm, Cuff Size: Large)   Pulse 75   Temp 98.2 F (36.8 C) (Oral)   Wt 227 lb 8 oz (103.2 kg)   SpO2 96%   BMI 39.05 kg/m   Wt Readings from Last 3 Encounters:  09/19/17 227 lb 8 oz (103.2 kg)  03/19/17 228 lb 8 oz (103.6 kg)  09/23/16 229 lb 4 oz (104 kg)    Physical Exam  Constitutional: She appears well-developed and well-nourished. No distress.  HENT:  Mouth/Throat: Oropharynx is clear and moist. No oropharyngeal exudate.  Eyes: Pupils are equal, round, and reactive to light. Conjunctivae are normal.  Neck: Normal range of motion. Neck supple. No thyromegaly present.  Cardiovascular: Normal rate, regular rhythm, normal heart sounds and intact distal pulses.   No murmur heard. Pulmonary/Chest: Effort normal and breath sounds normal. No respiratory distress. She has no wheezes. She has no rales.  Musculoskeletal: She exhibits no edema.  Lymphadenopathy:    She has no cervical adenopathy.  Skin: Skin is warm and dry. No rash noted.  Psychiatric: She has a normal mood and affect.  Nursing note and vitals reviewed.  Results for orders placed or performed in visit on 05/19/17  Calcium  Result Value Ref Range   Calcium 10.4 8.4 - 10.5 mg/dL      Assessment & Plan:   Problem List Items Addressed This Visit    CKD  (chronic kidney disease) stage 3, GFR 30-59 ml/min    Update labs today. Will increase hctz to 25mg  daily.       Relevant Orders   Renal function panel   Hypertension - Primary    Chronic, uncontrolled. Will increase HCTZ to 25mg  daily. Continue other meds.       Relevant Medications   lisinopril (PRINIVIL,ZESTRIL) 40 MG tablet   hydrochlorothiazide (MICROZIDE) 12.5 MG capsule   diltiazem (TIAZAC) 240 MG 24 hr capsule   Hypothyroidism    Chronic, stable. Continue current regimen.       Relevant Medications   levothyroxine (SYNTHROID, LEVOTHROID) 88 MCG tablet   Osteoporosis    Prolia started 05/2017.           Follow up plan: Return in about 6 months (around 03/19/2018), or if symptoms worsen or fail to improve, for annual exam, prior fasting for blood work, medicare wellness visit.  Brittany Bush, MD

## 2017-09-19 NOTE — Addendum Note (Signed)
Addended by: Brenton Grills on: 3/77/9396 88:64 PM   Modules accepted: Orders

## 2017-09-19 NOTE — Assessment & Plan Note (Signed)
Prolia started 05/2017.

## 2017-09-22 ENCOUNTER — Telehealth: Payer: Self-pay

## 2017-09-22 MED ORDER — HYDROCHLOROTHIAZIDE 25 MG PO TABS: 25.0000 mg | ORAL_TABLET | Freq: Every day | ORAL | 3 refills | Status: DC

## 2017-09-22 NOTE — Telephone Encounter (Signed)
Pt left v/m; pt seen 09/19/17; pt understood HCTZ 12.5 mg was going to be increased to 25 mg. Pt picked up med at pharmacy and still 12.5 mg. Pt wants to know if she is supposed to increase HCTZ to 25 mg. Pt request cb. [facility]. It looks like in 09/19/17 office note HCTZ was supposed to be increased to 25 mg.Please advise.

## 2017-09-22 NOTE — Telephone Encounter (Signed)
New dose sent to pharmacy. Pt may double up on current dose until runs out.

## 2017-09-25 NOTE — Telephone Encounter (Signed)
Spoke with pt, relayed message per Dr. Darnell Level.

## 2018-02-12 ENCOUNTER — Telehealth: Payer: Self-pay | Admitting: *Deleted

## 2018-02-12 NOTE — Telephone Encounter (Signed)
Attempted to contact pt; unable to leave vm 

## 2018-02-12 NOTE — Telephone Encounter (Signed)
Verification of benefits have been processed and an approval has been received for pts prolia injection. Pts estimated cost are appx $220. This is only an estimate and cannot be confirmed until benefits are paid. Please advise pt and schedule if needed. If scheduled, once the injection is received, pls contact me back with the date it was received so that I am able to update prolia folder. Thanks   PA required but previously completed and valid through 04/2018

## 2018-02-12 NOTE — Telephone Encounter (Signed)
Spoke to pt who agrees to [facility]. Ca lab and prolia injection scheduled.

## 2018-02-12 NOTE — Telephone Encounter (Signed)
Pt calling back to speak with CMA that called her

## 2018-02-13 ENCOUNTER — Other Ambulatory Visit (INDEPENDENT_AMBULATORY_CARE_PROVIDER_SITE_OTHER): Payer: Medicare Other

## 2018-02-13 DIAGNOSIS — M810 Age-related osteoporosis without current pathological fracture: Secondary | ICD-10-CM

## 2018-02-13 LAB — CALCIUM: Calcium: 10.1 mg/dL (ref 8.4–10.5)

## 2018-02-18 ENCOUNTER — Ambulatory Visit (INDEPENDENT_AMBULATORY_CARE_PROVIDER_SITE_OTHER): Payer: Medicare Other

## 2018-02-18 DIAGNOSIS — M810 Age-related osteoporosis without current pathological fracture: Secondary | ICD-10-CM

## 2018-02-18 MED ORDER — DENOSUMAB 60 MG/ML ~~LOC~~ SOLN
60.0000 mg | Freq: Once | SUBCUTANEOUS | Status: AC
  Administered 2018-02-18: 60 mg via SUBCUTANEOUS

## 2018-02-19 NOTE — Telephone Encounter (Signed)
As FYI pt got prolia inj on 02/18/18.

## 2018-02-22 NOTE — Progress Notes (Signed)
prolia shot

## 2018-02-27 ENCOUNTER — Encounter: Payer: Self-pay | Admitting: Family Medicine

## 2018-03-05 ENCOUNTER — Other Ambulatory Visit: Payer: Self-pay | Admitting: Family Medicine

## 2018-03-19 ENCOUNTER — Ambulatory Visit: Payer: Medicare Other | Admitting: Family Medicine

## 2018-03-23 ENCOUNTER — Other Ambulatory Visit: Payer: Self-pay | Admitting: Family Medicine

## 2018-03-23 DIAGNOSIS — E039 Hypothyroidism, unspecified: Secondary | ICD-10-CM

## 2018-03-23 DIAGNOSIS — N183 Chronic kidney disease, stage 3 unspecified: Secondary | ICD-10-CM

## 2018-03-23 DIAGNOSIS — E785 Hyperlipidemia, unspecified: Secondary | ICD-10-CM

## 2018-03-24 ENCOUNTER — Ambulatory Visit: Payer: Medicare Other

## 2018-03-27 ENCOUNTER — Encounter: Payer: Medicare Other | Admitting: Family Medicine

## 2018-03-31 ENCOUNTER — Other Ambulatory Visit: Payer: Self-pay | Admitting: Family Medicine

## 2018-03-31 ENCOUNTER — Ambulatory Visit (INDEPENDENT_AMBULATORY_CARE_PROVIDER_SITE_OTHER): Payer: Medicare Other

## 2018-03-31 VITALS — BP 140/82 | HR 61 | Temp 98.4°F | Ht 63.5 in | Wt 230.2 lb

## 2018-03-31 DIAGNOSIS — N183 Chronic kidney disease, stage 3 unspecified: Secondary | ICD-10-CM

## 2018-03-31 DIAGNOSIS — E785 Hyperlipidemia, unspecified: Secondary | ICD-10-CM

## 2018-03-31 DIAGNOSIS — M810 Age-related osteoporosis without current pathological fracture: Secondary | ICD-10-CM

## 2018-03-31 DIAGNOSIS — Z0000 Encounter for general adult medical examination without abnormal findings: Secondary | ICD-10-CM | POA: Diagnosis not present

## 2018-03-31 DIAGNOSIS — E039 Hypothyroidism, unspecified: Secondary | ICD-10-CM | POA: Diagnosis not present

## 2018-03-31 LAB — CBC WITH DIFFERENTIAL/PLATELET
BASOS PCT: 1.4 % (ref 0.0–3.0)
Basophils Absolute: 0.1 10*3/uL (ref 0.0–0.1)
EOS ABS: 0.1 10*3/uL (ref 0.0–0.7)
Eosinophils Relative: 2.7 % (ref 0.0–5.0)
HCT: 36.6 % (ref 36.0–46.0)
HEMOGLOBIN: 12.1 g/dL (ref 12.0–15.0)
LYMPHS ABS: 1.2 10*3/uL (ref 0.7–4.0)
Lymphocytes Relative: 24.5 % (ref 12.0–46.0)
MCHC: 33 g/dL (ref 30.0–36.0)
MCV: 87.8 fl (ref 78.0–100.0)
MONO ABS: 0.5 10*3/uL (ref 0.1–1.0)
Monocytes Relative: 9.6 % (ref 3.0–12.0)
NEUTROS PCT: 61.8 % (ref 43.0–77.0)
Neutro Abs: 3.1 10*3/uL (ref 1.4–7.7)
Platelets: 263 10*3/uL (ref 150.0–400.0)
RBC: 4.16 Mil/uL (ref 3.87–5.11)
RDW: 13.2 % (ref 11.5–15.5)
WBC: 5 10*3/uL (ref 4.0–10.5)

## 2018-03-31 LAB — LIPID PANEL
CHOL/HDL RATIO: 3
Cholesterol: 157 mg/dL (ref 0–200)
HDL: 53.5 mg/dL (ref >39.00)
LDL CALC: 74 mg/dL (ref 0–99)
NONHDL: 103.05
Triglycerides: 144 mg/dL (ref 0.0–149.0)
VLDL: 28.8 mg/dL (ref 0.0–40.0)

## 2018-03-31 LAB — COMPREHENSIVE METABOLIC PANEL
ALBUMIN: 3.7 g/dL (ref 3.5–5.2)
ALT: 11 U/L (ref 0–35)
AST: 18 U/L (ref 0–37)
Alkaline Phosphatase: 36 U/L — ABNORMAL LOW (ref 39–117)
BUN: 20 mg/dL (ref 6–23)
CHLORIDE: 105 meq/L (ref 96–112)
CO2: 30 meq/L (ref 19–32)
Calcium: 10.3 mg/dL (ref 8.4–10.5)
Creatinine, Ser: 1.2 mg/dL (ref 0.40–1.20)
GFR: 45.29 mL/min — ABNORMAL LOW (ref >60.00)
GLUCOSE: 87 mg/dL (ref 70–99)
Potassium: 4.2 mEq/L (ref 3.5–5.1)
SODIUM: 140 meq/L (ref 135–145)
Total Bilirubin: 0.5 mg/dL (ref 0.2–1.2)
Total Protein: 7.3 g/dL (ref 6.0–8.3)

## 2018-03-31 LAB — TSH: TSH: 1.84 u[IU]/mL (ref 0.35–4.50)

## 2018-03-31 LAB — VITAMIN D 25 HYDROXY (VIT D DEFICIENCY, FRACTURES): VITD: 47.69 ng/mL (ref 30.00–100.00)

## 2018-03-31 NOTE — Progress Notes (Signed)
Subjective:   [name] is a 84 y.o. female who presents for Medicare Annual (Subsequent) preventive examination.  Review of Systems:  N/A Cardiac Risk Factors include: advanced age (>93men, >50 women);dyslipidemia;obesity (BMI >30kg/m2)     Objective:     Vitals: BP 140/82 (BP Location: Left Arm, Patient Position: Sitting, Cuff Size: Normal)   Pulse 61   Temp 98.4 F (36.9 C) (Oral)   Ht 5' 3.5" (1.613 m) Comment: shoes  Wt 230 lb 4 oz (104.4 kg)   SpO2 98%   BMI 40.15 kg/m   Body mass index is 40.15 kg/m.  Advanced Directives 03/31/2018 03/07/2016  Does Patient Have a Medical Advance Directive? No No  Would patient like information on creating a medical advance directive? No - Patient declined Yes - Scientist, clinical (histocompatibility and immunogenetics) given    Tobacco Social History   Tobacco Use  Smoking Status Former Smoker  . Last attempt to quit: 12/30/1992  . Years since quitting: 25.2  Smokeless Tobacco Never Used     Counseling given: No   Clinical Intake:  Pre-visit preparation completed: Yes  Pain : No/denies pain Pain Score: 0-No pain     Nutritional Status: BMI > 30  Obese Nutritional Risks: None Diabetes: No  How often do you need to have someone help you when you read instructions, pamphlets, or other written materials from your doctor or pharmacy?: 1 - Never What is the last grade level you completed in school?: GED  Interpreter Needed?: No  Comments: pt is a widow and lives in assisted living facility Information entered by :: LPinson, LPN  Past Medical History:  Diagnosis Date  . Arthritis   . Depression   . History of anemia   . History of chicken pox   . History of ulcer disease 1990s  . HLD (hyperlipidemia)   . HTN (hypertension)   . Hypothyroidism   . Osteoporosis 03/2016   at [place] - T -3.1 hip, -1.0 spine  . Uses hearing aid    Past Surgical History:  Procedure Laterality Date  . APPENDECTOMY  1974  . BACK SURGERY  1979  . CARDIOVASCULAR STRESS  TEST  2010   WNL  . CARPAL TUNNEL RELEASE Left 04/28/2017  . CATARACT EXTRACTION Bilateral 2012  . DEXA  03/2016   at [facility] - T -3.1 hip, -1.0 spine  . GALLBLADDER SURGERY  1974  . [place]   right  . [place]   left  . KNEE SURGERY  2008  . TONSILLECTOMY  1945  . US ECHOCARDIOGRAPHY  2010   LVEF 65-75%, overall normal, mildly decreased LV diastolic compliance   Family History  Problem Relation Age of Onset  . Cancer Mother 7       lung, nonsmoker  . Cancer Father        lung, nonsmoker  . Cancer Brother 69       lung, smoker  . Cancer Sister 64       brain  . Cancer Sister 5       lung, smoker  . COPD Sister        smoker  . Cancer Sister 12       colon  . Hypertension Daughter   . Diabetes Daughter   . CAD Neg Hx   . Stroke Neg Hx    Social History   Socioeconomic History  . Marital status: Widowed    Spouse name: Not on file  . Number of  children: Not on file  . Years of education: Not on file  . Highest education level: Not on file  Occupational History  . Not on file  Social Needs  . Financial resource strain: Not on file  . Food insecurity:    Worry: Not on file    Inability: Not on file  . Transportation needs:    Medical: Not on file    Non-medical: Not on file  Tobacco Use  . Smoking status: Former Smoker    Last attempt to quit: 12/30/1992    Years since quitting: 25.2  . Smokeless tobacco: Never Used  Substance and Sexual Activity  . Alcohol use: No  . Drug use: No  . Sexual activity: Never  Lifestyle  . Physical activity:    Days per week: Not on file    Minutes per session: Not on file  . Stress: Not on file  Relationships  . Social connections:    Talks on phone: Not on file    Gets together: Not on file    Attends religious service: Not on file    Active member of club or organization: Not on file    Attends meetings of clubs or organizations: Not on file    Relationship status: Not on file  Other Topics  Concern  . Not on file  Social History Narrative   Widower since 1999-04-21 - husband battled cancer for 6 yrs, died from MI   Daughter Angeleah Labrake) passed away 21-Apr-2015   Lies in retirement home - [facility] on [address].   Occupation: retired   HS: 12th grade   Activity: no regular exercise   Diet: good water, fruits/vegetables daily    Outpatient Encounter Medications as of 03/31/2018  Medication Sig  . acetaminophen (TYLENOL) 500 MG tablet Take 1,000 mg by mouth daily as needed.   . Calcium Carbonate-Vitamin D (CALCIUM 600+D) 600-400 MG-UNIT tablet Take 1 tablet by mouth daily.  . cholecalciferol (VITAMIN D) 1000 UNITS tablet Take 1,000 Units by mouth daily.  Marland Kitchen diltiazem (TIAZAC) 240 MG 24 hr capsule Take 1 capsule (240 mg total) by mouth daily.  . diphenhydramine-acetaminophen (TYLENOL PM) 25-500 MG TABS Take 2 tablets by mouth at bedtime as needed.  Marland Kitchen FLUoxetine (PROZAC) 20 MG tablet Take 1 tablet (20 mg total) by mouth daily.  . hydrochlorothiazide (HYDRODIURIL) 25 MG tablet Take 1 tablet (25 mg total) by mouth daily.  Marland Kitchen levothyroxine (SYNTHROID, LEVOTHROID) 88 MCG tablet Take 1 tablet (88 mcg total) by mouth daily before breakfast.  . lisinopril (PRINIVIL,ZESTRIL) 40 MG tablet TAKE 1 BY MOUTH TWICE DAILY  . loratadine (CLARITIN) 10 MG tablet Take 10 mg by mouth daily.  . pravastatin (PRAVACHOL) 40 MG tablet TAKE 1 TABLET BY MOUTH EVERY DAY  . ranitidine (ZANTAC) 150 MG tablet Take 150 mg by mouth at bedtime.   . vitamin B-12 (CYANOCOBALAMIN) 500 MCG tablet Take 1 tablet (500 mcg total) by mouth daily.   No facility-administered encounter medications on file as of 03/31/2018.     Activities of Daily Living In your present state of health, do you have any difficulty performing the following activities: 03/31/2018  Hearing? Y  Vision? N  Difficulty concentrating or making decisions? Y  Walking or climbing stairs? Y  Dressing or bathing? N  Doing errands, shopping? N  Preparing Food and  eating ? N  Using the Toilet? N  In the past six months, have you accidently leaked urine? Y  Do you have problems with loss  of bowel control? N  Managing your Medications? N  Managing your Finances? N  Housekeeping or managing your Housekeeping? N  Some recent data might be hidden    Patient Care Team: [name], MD as PCP - General (Family Medicine)    Assessment:   This is a routine wellness examination for [facility].  Hearing Screening Comments: Bilateral hearing aids Vision Screening Comments: Last vision exam in 2018 with Dr. Einar Gip   Exercise Activities and Dietary recommendations Current Exercise Habits: Home exercise routine, Type of exercise: walking, Time (Minutes): 15, Frequency (Times/Week): 7, Weekly Exercise (Minutes/Week): 105, Intensity: Mild, Exercise limited by: None identified  Goals    . Increase physical activity     Starting 03/31/2018, I will continue to walk at least 15 minutes daily.        Fall Risk Fall Risk  03/31/2018 03/19/2017 03/07/2016 03/13/2015 03/10/2014  Falls in the past year? Yes No No No No  Comment fell on birthday in 2018 after stepping up on curb; bruising only with no medical treatment - - - -  Number falls in past yr: 1 - - - -  Injury with Fall? Yes - - - -  Risk for fall due to : Impaired balance/gait;Impaired mobility - Impaired balance/gait - -   Depression Screen PHQ 2/9 Scores 03/31/2018 03/19/2017 03/07/2016 03/13/2015  PHQ - 2 Score 0 0 0 0  PHQ- 9 Score 0 - - -     Cognitive Function MMSE - Mini Mental State Exam 03/31/2018 03/07/2016  Orientation to time 5 5  Orientation to Place 5 5  Registration 3 3  Attention/ Calculation 0 5  Recall 3 3  Language- name 2 objects 0 0  Language- repeat 1 1  Language- follow 3 step command 3 3  Language- read & follow direction 0 1  Write a sentence 0 0  Copy design 0 0  Total score 20 26     PLEASE NOTE: A Mini-Cog screen was completed. Maximum score is 20. A value of 0 denotes  this part of Folstein MMSE was not completed or the patient failed this part of the Mini-Cog screening.   Mini-Cog Screening Orientation to Time - Max 5 pts Orientation to Place - Max 5 pts Registration - Max 3 pts Recall - Max 3 pts Language Repeat - Max 1 pts Language Follow 3 Step Command - Max 3 pts  Immunization History  Administered Date(s) Administered  . Influenza Whole 10/30/2013  . Influenza,inj,Quad PF,6+ Mos 09/14/2015, 09/23/2016, 09/19/2017  . Influenza-Unspecified 10/07/2014  . Pneumococcal Conjugate-13 03/10/2014  . Pneumococcal Polysaccharide-23 10/27/2012  . Td 12/30/2008  . Zoster 12/31/2011    Screening Tests Health Maintenance  Topic Date Due  . DTaP/Tdap/Td (1 - Tdap) 12/30/2018 (Originally 12/31/2008)  . INFLUENZA VACCINE  07/30/2018  . TETANUS/TDAP  12/30/2018  . DEXA SCAN  Completed  . PNA vac Low Risk Adult  Completed       Plan:     I have personally reviewed, addressed, and noted the following in the patient's chart:  A. Medical and social history B. Use of alcohol, tobacco or illicit drugs  C. Current medications and supplements D. Functional ability and status E.  Nutritional status F.  Physical activity G. Advance directives H. List of other physicians I.  Hospitalizations, surgeries, and ER visits in previous 12 months J.  [place] to include hearing, vision, cognitive, depression L. Referrals and appointments - none  In addition, I have reviewed and  discussed with patient certain preventive protocols, quality metrics, and best practice recommendations. A written personalized care plan for preventive services as well as general preventive health recommendations were provided to patient.  See attached scanned questionnaire for additional information.   Signed,   Lindell Noe, MHA, BS, LPN Health Coach

## 2018-03-31 NOTE — Patient Instructions (Addendum)
[name] , Thank you for taking time to come for your Medicare Wellness Visit. I appreciate your ongoing commitment to your health goals. Please review the following plan we discussed and let me know if I can assist you in the future.   These are the goals we discussed: Goals    . Increase physical activity     Starting 03/31/2018, I will continue to walk at least 15 minutes daily.        This is a list of the screening recommended for you and due dates:  Health Maintenance  Topic Date Due  . DTaP/Tdap/Td vaccine (1 - Tdap) 12/30/2018*  . Flu Shot  07/30/2018  . Tetanus Vaccine  12/30/2018  . DEXA scan (bone density measurement)  Completed  . Pneumonia vaccines  Completed  *Topic was postponed. The date shown is not the original due date.   Preventive Care for Adults  A healthy lifestyle and preventive care can promote health and wellness. Preventive health guidelines for adults include the following key practices.  . A routine yearly physical is a good way to check with your health care provider about your health and preventive screening. It is a chance to share any concerns and updates on your health and to receive a thorough exam.  . Visit your dentist for a routine exam and preventive care every 6 months. Brush your teeth twice a day and floss once a day. Good oral hygiene prevents tooth decay and gum disease.  . The frequency of eye exams is based on your age, health, family medical history, use  of contact lenses, and other factors. Follow your health care provider's recommendations for frequency of eye exams.  . Eat a healthy diet. Foods like vegetables, fruits, whole grains, low-fat dairy products, and lean protein foods contain the nutrients you need without too many calories. Decrease your intake of foods high in solid fats, added sugars, and salt. Eat the right amount of calories for you. Get information about a proper diet from your health care provider, if  necessary.  . Regular physical exercise is one of the most important things you can do for your health. Most adults should get at least 150 minutes of moderate-intensity exercise (any activity that increases your heart rate and causes you to sweat) each week. In addition, most adults need muscle-strengthening exercises on 2 or more days a week.  Silver Sneakers may be a benefit available to you. To determine eligibility, you may visit the website: www.silversneakers.com or contact program at 617 565 8166 Mon-Fri between 8AM-8PM.   . Maintain a healthy weight. The body mass index (BMI) is a screening tool to identify possible weight problems. It provides an estimate of body fat based on height and weight. Your health care provider can find your BMI and can help you achieve or maintain a healthy weight.   For adults 20 years and older: ? A BMI below 18.5 is considered underweight. ? A BMI of 18.5 to 24.9 is normal. ? A BMI of 25 to 29.9 is considered overweight. ? A BMI of 30 and above is considered obese.   . Maintain normal blood lipids and cholesterol levels by exercising and minimizing your intake of saturated fat. Eat a balanced diet with plenty of fruit and vegetables. Blood tests for lipids and cholesterol should begin at age 54 and be repeated every 5 years. If your lipid or cholesterol levels are high, you are over 50, or you are at high risk for heart disease,  you may need your cholesterol levels checked more frequently. Ongoing high lipid and cholesterol levels should be treated with medicines if diet and exercise are not working.  . If you smoke, find out from your health care provider how to quit. If you do not use tobacco, please do not start.  . If you choose to drink alcohol, please do not consume more than 2 drinks per day. One drink is considered to be 12 ounces (355 mL) of beer, 5 ounces (148 mL) of wine, or 1.5 ounces (44 mL) of liquor.  . If you are 58-65 years old, ask your  health care provider if you should take aspirin to prevent strokes.  . Use sunscreen. Apply sunscreen liberally and repeatedly throughout the day. You should seek shade when your shadow is shorter than you. Protect yourself by wearing long sleeves, pants, a wide-brimmed hat, and sunglasses year round, whenever you are outdoors.  . Once a month, do a whole body skin exam, using a mirror to look at the skin on your back. Tell your health care provider of new moles, moles that have irregular borders, moles that are larger than a pencil eraser, or moles that have changed in shape or color.

## 2018-03-31 NOTE — Progress Notes (Signed)
PCP notes:   Health maintenance:  No gaps identified.  Abnormal screenings:   Fall risk - hx of single fall Fall Risk  03/31/2018 03/19/2017 03/07/2016 03/13/2015 03/10/2014  Falls in the past year? Yes No No No No  Comment fell on birthday in 2018 after stepping up on curb; bruising only with no medical treatment - - - -  Number falls in past yr: 1 - - - -  Injury with Fall? Yes - - - -  Risk for fall due to : Impaired balance/gait;Impaired mobility - Impaired balance/gait - -   Patient concerns:   None  Nurse concerns:  None  Next PCP appt:   04/02/18 @ 1030

## 2018-04-02 ENCOUNTER — Ambulatory Visit (INDEPENDENT_AMBULATORY_CARE_PROVIDER_SITE_OTHER): Payer: Medicare Other | Admitting: Family Medicine

## 2018-04-02 ENCOUNTER — Encounter: Payer: Self-pay | Admitting: Family Medicine

## 2018-04-02 ENCOUNTER — Ambulatory Visit (INDEPENDENT_AMBULATORY_CARE_PROVIDER_SITE_OTHER)
Admission: RE | Admit: 2018-04-02 | Discharge: 2018-04-02 | Disposition: A | Discharge status: Home / Self Care | Payer: Medicare Other | Source: Ambulatory Visit | Attending: Family Medicine | Admitting: Family Medicine

## 2018-04-02 VITALS — BP 146/80 | HR 78 | Temp 98.0°F | Ht 64.0 in | Wt 230.0 lb

## 2018-04-02 DIAGNOSIS — Z0001 Encounter for general adult medical examination with abnormal findings: Secondary | ICD-10-CM

## 2018-04-02 DIAGNOSIS — N183 Chronic kidney disease, stage 3 unspecified: Secondary | ICD-10-CM

## 2018-04-02 DIAGNOSIS — E039 Hypothyroidism, unspecified: Secondary | ICD-10-CM

## 2018-04-02 DIAGNOSIS — I10 Essential (primary) hypertension: Secondary | ICD-10-CM | POA: Diagnosis not present

## 2018-04-02 DIAGNOSIS — M810 Age-related osteoporosis without current pathological fracture: Secondary | ICD-10-CM | POA: Diagnosis not present

## 2018-04-02 DIAGNOSIS — M25532 Pain in left wrist: Secondary | ICD-10-CM | POA: Insufficient documentation

## 2018-04-02 DIAGNOSIS — Z7189 Other specified counseling: Secondary | ICD-10-CM | POA: Diagnosis not present

## 2018-04-02 DIAGNOSIS — F331 Major depressive disorder, recurrent, moderate: Secondary | ICD-10-CM

## 2018-04-02 DIAGNOSIS — E785 Hyperlipidemia, unspecified: Secondary | ICD-10-CM | POA: Diagnosis not present

## 2018-04-02 NOTE — Assessment & Plan Note (Signed)
Advanced directives: has not set up. Would want daughter [name] to be HCPOA. Packet previously provided. Doesn't want prolonged life support.

## 2018-04-02 NOTE — Assessment & Plan Note (Signed)
Chronic, stable. Continue current regimen. 

## 2018-04-02 NOTE — Assessment & Plan Note (Signed)
Chronic, stable. Continue to monitor. Reviewed with patient importance of good water intake.

## 2018-04-02 NOTE — Assessment & Plan Note (Signed)
Preventative protocols reviewed and updated unless pt declined. Discussed healthy diet and lifestyle.  

## 2018-04-02 NOTE — Assessment & Plan Note (Signed)
After fall suffered 05/2017, after recent L CTR 03/2017. She is tender over thumb tendons as well as some tenderness at scaphoid. Check baseline xrays r/o scaphoid fracture, but anticipate de quervain's tenosynovitis - supportive care reviewed including wrist brace use. Update if no better, would consider hand surgery referral.

## 2018-04-02 NOTE — Progress Notes (Signed)
BP (!) 146/80 (BP Location: Left Arm, Patient Position: Sitting, Cuff Size: Normal)   Pulse 78   Temp 98 F (36.7 C) (Oral)   Ht 5\' 4"  (1.626 m)   Wt 230 lb (104.3 kg)   SpO2 96%   BMI 39.48 kg/m    CC: CPE Subjective:    Patient ID: [name], female    DOB: December 12, 1930, 84 y.o.   MRN: [id number]  HPI: [name] is a 84 y.o. female presenting on 04/02/2018 for Medicare Wellness (Part 2) and Hand Pain (Left. Still bothering pt even almost a year after surgery.)   Saw Lesia earlier this week for medicare wellness visit. Note reviewed. She had a fall last year 05/2017 - tripped over curb. Uses hearing aides.  S/p L carpal tunnel release 03/2017 - initial improvement but lateral wrist pain worsening after fall.  Has had steroid shots into her spine  Preventative: Colonoscopy - had normal colonoscopy per pt years ago. Normal iFOB 03/29/12. Has aged out. Mammogram - 10/2012, normal. Declines repeat, declines breast exam in office. Does do breast exams at home.  Well woman - aged out. Declines pelvic exam.  DEXA 03/2016 at [facility] - T -3.1 hip, -1.0 spine Flu - yearly Pneumovax - 09/2012. Prevnar - Mar 29, 2014 Tetanus - March 29, 2009  zostavax - 12/2011  shingrix - discussed Advanced directives: has not set up. Would want daughter [name] to be HCPOA. Packet previously provided. Doesn't want prolonged life support. .  Seat belt use discussed Sunscreen use discussed. No changing moles on skin.  Non smoker Alcohol - none  Widower since Mar 30, 1999 - husband battled cancer for 6 yrs, died from MI Daughter [name]) passed away 2015/03/30. Other daughter [name] nearby.  Lives in retirement home - [facility] on [address].  Occupation: retired  HS: 12th grade  Activity: no regular exercise  Diet: good water, fruits/vegetables daily  Relevant past medical, surgical, family and social history reviewed and updated as indicated. Interim medical history since our last visit reviewed. Allergies and  medications reviewed and updated. Outpatient Medications Prior to Visit  Medication Sig Dispense Refill  . acetaminophen (TYLENOL) 500 MG tablet Take 1,000 mg by mouth daily as needed.     . Calcium Carbonate-Vitamin D (CALCIUM 600+D) 600-400 MG-UNIT tablet Take 1 tablet by mouth daily.    . cholecalciferol (VITAMIN D) 1000 UNITS tablet Take 1,000 Units by mouth daily.    Marland Kitchen diltiazem (TIAZAC) 240 MG 24 hr capsule Take 1 capsule (240 mg total) by mouth daily. 90 capsule 3  . diphenhydramine-acetaminophen (TYLENOL PM) 25-500 MG TABS Take 2 tablets by mouth at bedtime as needed.    Marland Kitchen FLUoxetine (PROZAC) 20 MG tablet Take 1 tablet (20 mg total) by mouth daily. 90 tablet 3  . hydrochlorothiazide (HYDRODIURIL) 25 MG tablet Take 1 tablet (25 mg total) by mouth daily. 90 tablet 3  . levothyroxine (SYNTHROID, LEVOTHROID) 88 MCG tablet Take 1 tablet (88 mcg total) by mouth daily before breakfast. 90 tablet 3  . lisinopril (PRINIVIL,ZESTRIL) 40 MG tablet TAKE 1 BY MOUTH TWICE DAILY 180 tablet 3  . loratadine (CLARITIN) 10 MG tablet Take 10 mg by mouth daily.    . pravastatin (PRAVACHOL) 40 MG tablet TAKE 1 TABLET BY MOUTH EVERY DAY 90 tablet 0  . ranitidine (ZANTAC) 150 MG tablet Take 150 mg by mouth at bedtime.     . vitamin B-12 (CYANOCOBALAMIN) 500 MCG tablet Take 1 tablet (500 mcg total) by mouth daily.  No facility-administered medications prior to visit.      Per HPI unless specifically indicated in ROS section below Review of Systems  Constitutional: Negative for activity change, appetite change, chills, fatigue, fever and unexpected weight change.  HENT: Negative for hearing loss.   Eyes: Negative for visual disturbance.  Respiratory: Negative for cough, chest tightness, shortness of breath and wheezing.   Cardiovascular: Negative for chest pain, palpitations and leg swelling.  Gastrointestinal: Negative for abdominal distention, abdominal pain, blood in stool, constipation, diarrhea,  nausea and vomiting.       Normal bowel movements  Genitourinary: Negative for difficulty urinating and hematuria.       Urinary urgency with some incontinence if held  Musculoskeletal: Negative for arthralgias, myalgias and neck pain.  Skin: Negative for rash.  Neurological: Positive for headaches (occasional). Negative for dizziness, seizures and syncope.  Hematological: Negative for adenopathy. Does not bruise/bleed easily.  Psychiatric/Behavioral: Negative for dysphoric mood. The patient is not nervous/anxious.        Objective:    BP (!) 146/80 (BP Location: Left Arm, Patient Position: Sitting, Cuff Size: Normal)   Pulse 78   Temp 98 F (36.7 C) (Oral)   Ht 5\' 4"  (1.626 m)   Wt 230 lb (104.3 kg)   SpO2 96%   BMI 39.48 kg/m   Wt Readings from Last 3 Encounters:  04/02/18 230 lb (104.3 kg)  03/31/18 230 lb 4 oz (104.4 kg)  09/19/17 227 lb 8 oz (103.2 kg)    Physical Exam  Constitutional: She is oriented to person, place, and time. She appears well-developed and well-nourished. No distress.  Uses cane today - has walker for home use  HENT:  Head: Normocephalic and atraumatic.  Right Ear: Hearing, tympanic membrane, external ear and ear canal normal.  Left Ear: Hearing, tympanic membrane, external ear and ear canal normal.  Nose: Nose normal.  Mouth/Throat: Uvula is midline, oropharynx is clear and moist and mucous membranes are normal. No oropharyngeal exudate, posterior oropharyngeal edema or posterior oropharyngeal erythema.  Eyes: Pupils are equal, round, and reactive to light. Conjunctivae and EOM are normal. No scleral icterus.  Neck: Normal range of motion. Neck supple. Carotid bruit is not present. No thyromegaly present.  Cardiovascular: Normal rate, regular rhythm, normal heart sounds and intact distal pulses.  No murmur heard. Pulses:      Radial pulses are 2+ on the right side, and 2+ on the left side.  Pulmonary/Chest: Effort normal and breath sounds normal.  No respiratory distress. She has no wheezes. She has no rales.  Abdominal: Soft. Bowel sounds are normal. She exhibits no distension and no mass. There is no tenderness. There is no rebound and no guarding.  Musculoskeletal: Normal range of motion. She exhibits no edema.  2+ rad pulses bilaterally Tender at L anatomical snuff box and L dorsal thumb + finkelstein test No pain at 1st [facility] joint  Lymphadenopathy:    She has no cervical adenopathy.  Neurological: She is alert and oriented to person, place, and time.  CN grossly intact, station and gait intact  Skin: Skin is warm and dry. No rash noted.  Psychiatric: She has a normal mood and affect. Her behavior is normal. Judgment and thought content normal.  Nursing note and vitals reviewed.  Results for orders placed or performed in visit on 03/31/18  VITAMIN D 25 Hydroxy (Vit-D Deficiency, Fractures)  Result Value Ref Range   VITD 47.69 30.00 - 100.00 ng/mL  CBC with Differential/Platelet  Result  Value Ref Range   WBC 5.0 4.0 - 10.5 K/uL   RBC 4.16 3.87 - 5.11 Mil/uL   Hemoglobin 12.1 12.0 - 15.0 g/dL   HCT 36.6 36.0 - 46.0 %   MCV 87.8 78.0 - 100.0 fl   MCHC 33.0 30.0 - 36.0 g/dL   RDW 13.2 11.5 - 15.5 %   Platelets 263.0 150.0 - 400.0 K/uL   Neutrophils Relative % 61.8 43.0 - 77.0 %   Lymphocytes Relative 24.5 12.0 - 46.0 %   Monocytes Relative 9.6 3.0 - 12.0 %   Eosinophils Relative 2.7 0.0 - 5.0 %   Basophils Relative 1.4 0.0 - 3.0 %   Neutro Abs 3.1 1.4 - 7.7 K/uL   Lymphs Abs 1.2 0.7 - 4.0 K/uL   Monocytes Absolute 0.5 0.1 - 1.0 K/uL   Eosinophils Absolute 0.1 0.0 - 0.7 K/uL   Basophils Absolute 0.1 0.0 - 0.1 K/uL  Comprehensive metabolic panel  Result Value Ref Range   Sodium 140 135 - 145 mEq/L   Potassium 4.2 3.5 - 5.1 mEq/L   Chloride 105 96 - 112 mEq/L   CO2 30 19 - 32 mEq/L   Glucose, Bld 87 70 - 99 mg/dL   BUN 20 6 - 23 mg/dL   Creatinine, Ser 1.20 0.40 - 1.20 mg/dL   Total Bilirubin 0.5 0.2 - 1.2 mg/dL    Alkaline Phosphatase 36 (L) 39 - 117 U/L   AST 18 0 - 37 U/L   ALT 11 0 - 35 U/L   Total Protein 7.3 6.0 - 8.3 g/dL   Albumin 3.7 3.5 - 5.2 g/dL   Calcium 10.3 8.4 - 10.5 mg/dL   GFR 45.29 (L) >60.00 mL/min  TSH  Result Value Ref Range   TSH 1.84 0.35 - 4.50 uIU/mL  Lipid panel  Result Value Ref Range   Cholesterol 157 0 - 200 mg/dL   Triglycerides 144.0 0.0 - 149.0 mg/dL   HDL 53.50 >39.00 mg/dL   VLDL 28.8 0.0 - 40.0 mg/dL   LDL Cholesterol 74 0 - 99 mg/dL   Total CHOL/HDL Ratio 3    NonHDL 103.05       Assessment & Plan:   Problem List Items Addressed This Visit    Advanced care planning/counseling discussion    Advanced directives: has not set up. Would want daughter [name] to be HCPOA. Packet previously provided. Doesn't want prolonged life support.       CKD (chronic kidney disease) stage 3, GFR 30-59 ml/min (HCC)    Chronic, stable. Continue to monitor. Reviewed with patient importance of good water intake.       Encounter for general adult medical examination with abnormal findings - Primary    Preventative protocols reviewed and updated unless pt declined. Discussed healthy diet and lifestyle.       Hyperlipidemia    Chronic, stable. Continue pravastatin.  The ASCVD Risk score Mikey Bussing DC Jr., et al., 2013) failed to calculate for the following reasons:   The 2013 ASCVD risk score is only valid for ages 70 to 93       Hypertension    Chronic, adequate control. Continue current regimen.       Hypothyroidism    Chronic, stable. Continue current regimen.       Left wrist pain    After fall suffered 05/2017, after recent L CTR 03/2017. She is tender over thumb tendons as well as some tenderness at scaphoid. Check baseline xrays r/o scaphoid fracture, but anticipate  de quervain's tenosynovitis - supportive care reviewed including wrist brace use. Update if no better, would consider hand surgery referral.       Relevant Orders   DG Wrist Complete Left   MDD  (major depressive disorder), recurrent episode, moderate (HCC)    Chronic, stable. Desires to continue prozac 20mg       Osteoporosis    Continue calcium, vit D, prolia (started 05/2017)          No orders of the defined types were placed in this encounter.  Orders Placed This Encounter  Procedures  . DG Wrist Complete Left    Standing Status:   Future    Number of Occurrences:   1    Standing Expiration Date:   06/03/2019    Order Specific Question:   Reason for Exam (SYMPTOM  OR DIAGNOSIS REQUIRED)    Answer:   persistent scaphiod pain after fall 10 months ago    Order Specific Question:   Preferred imaging location?    Answer:   [facility]    Order Specific Question:   Radiology Contrast Protocol - do NOT remove file path    Answer:   [email or web address]    Follow up plan: Return in about 1 year (around 04/03/2019) for annual exam, prior fasting for blood work, medicare wellness visit.  [name], MD

## 2018-04-02 NOTE — Assessment & Plan Note (Signed)
Chronic, stable. Desires to continue prozac 20mg 

## 2018-04-02 NOTE — Assessment & Plan Note (Signed)
Continue calcium, vit D, prolia (started 05/2017)

## 2018-04-02 NOTE — Patient Instructions (Addendum)
If interested, check with pharmacy about new 2 shot shingles series (shingrix).  You are doing well today Return as needed or in 1 year for next physical. I think you have wrist tendonitis (Dequervain's tenosynovitis). Xray today. Buy wrist brace for left wrist. Let me know if not improving with this.  Health Maintenance, Female Adopting a healthy lifestyle and getting preventive care can go a long way to promote health and wellness. Talk with your health care provider about what schedule of regular examinations is right for you. This is a good chance for you to check in with your provider about disease prevention and staying healthy. In between checkups, there are plenty of things you can do on your own. Experts have done a lot of research about which lifestyle changes and preventive measures are most likely to keep you healthy. Ask your health care provider for more information. Weight and diet Eat a healthy diet  Be sure to include plenty of vegetables, fruits, low-fat dairy products, and lean protein.  Do not eat a lot of foods high in solid fats, added sugars, or salt.  Get regular exercise. This is one of the most important things you can do for your health. ? Most adults should exercise for at least 150 minutes each week. The exercise should increase your heart rate and make you sweat (moderate-intensity exercise). ? Most adults should also do strengthening exercises at least twice a week. This is in addition to the moderate-intensity exercise.  Maintain a healthy weight  Body mass index (BMI) is a measurement that can be used to identify possible weight problems. It estimates body fat based on height and weight. Your health care provider can help determine your BMI and help you achieve or maintain a healthy weight.  For females 9 years of age and older: ? A BMI below 18.5 is considered underweight. ? A BMI of 18.5 to 24.9 is normal. ? A BMI of 25 to 29.9 is considered  overweight. ? A BMI of 30 and above is considered obese.  Watch levels of cholesterol and blood lipids  You should start having your blood tested for lipids and cholesterol at 82 years of age, then have this test every 5 years.  You may need to have your cholesterol levels checked more often if: ? Your lipid or cholesterol levels are high. ? You are older than 82 years of age. ? You are at high risk for heart disease.  Cancer screening Lung Cancer  Lung cancer screening is recommended for adults 24-41 years old who are at high risk for lung cancer because of a history of smoking.  A yearly low-dose CT scan of the lungs is recommended for people who: ? Currently smoke. ? Have quit within the past 15 years. ? Have at least a 30-pack-year history of smoking. A pack year is smoking an average of one pack of cigarettes a day for 1 year.  Yearly screening should continue until it has been 15 years since you quit.  Yearly screening should stop if you develop a health problem that would prevent you from having lung cancer treatment.  Breast Cancer  Practice breast self-awareness. This means understanding how your breasts normally appear and feel.  It also means doing regular breast self-exams. Let your health care provider know about any changes, no matter how small.  If you are in your 20s or 30s, you should have a clinical breast exam (CBE) by a health care provider every 1-3 years as  part of a regular health exam.  If you are 40 or older, have a CBE every year. Also consider having a breast X-ray (mammogram) every year.  If you have a family history of breast cancer, talk to your health care provider about genetic screening.  If you are at high risk for breast cancer, talk to your health care provider about having an MRI and a mammogram every year.  Breast cancer gene (BRCA) assessment is recommended for women who have family members with BRCA-related cancers. BRCA-related cancers  include: ? Breast. ? Ovarian. ? Tubal. ? Peritoneal cancers.  Results of the assessment will determine the need for genetic counseling and BRCA1 and BRCA2 testing.  Cervical Cancer Your health care provider may recommend that you be screened regularly for cancer of the pelvic organs (ovaries, uterus, and vagina). This screening involves a pelvic examination, including checking for microscopic changes to the surface of your cervix (Pap test). You may be encouraged to have this screening done every 3 years, beginning at age 66.  For women ages 23-65, health care providers may recommend pelvic exams and Pap testing every 3 years, or they may recommend the Pap and pelvic exam, combined with testing for human papilloma virus (HPV), every 5 years. Some types of HPV increase your risk of cervical cancer. Testing for HPV may also be done on women of any age with unclear Pap test results.  Other health care providers may not recommend any screening for nonpregnant women who are considered low risk for pelvic cancer and who do not have symptoms. Ask your health care provider if a screening pelvic exam is right for you.  If you have had past treatment for cervical cancer or a condition that could lead to cancer, you need Pap tests and screening for cancer for at least 20 years after your treatment. If Pap tests have been discontinued, your risk factors (such as having a new sexual partner) need to be reassessed to determine if screening should resume. Some women have medical problems that increase the chance of getting cervical cancer. In these cases, your health care provider may recommend more frequent screening and Pap tests.  Colorectal Cancer  This type of cancer can be detected and often prevented.  Routine colorectal cancer screening usually begins at 82 years of age and continues through 82 years of age.  Your health care provider may recommend screening at an earlier age if you have risk factors  for colon cancer.  Your health care provider may also recommend using home test kits to check for hidden blood in the stool.  A small camera at the end of a tube can be used to examine your colon directly (sigmoidoscopy or colonoscopy). This is done to check for the earliest forms of colorectal cancer.  Routine screening usually begins at age 84.  Direct examination of the colon should be repeated every 5-10 years through 82 years of age. However, you may need to be screened more often if early forms of precancerous polyps or small growths are found.  Skin Cancer  Check your skin from head to toe regularly.  Tell your health care provider about any new moles or changes in moles, especially if there is a change in a mole's shape or color.  Also tell your health care provider if you have a mole that is larger than the size of a pencil eraser.  Always use sunscreen. Apply sunscreen liberally and repeatedly throughout the day.  Protect yourself by wearing  long sleeves, pants, a wide-brimmed hat, and sunglasses whenever you are outside.  Heart disease, diabetes, and high blood pressure  High blood pressure causes heart disease and increases the risk of stroke. High blood pressure is more likely to develop in: ? People who have blood pressure in the high end of the normal range (130-139/85-89 mm Hg). ? People who are overweight or obese. ? People who are African American.  If you are 84-53 years of age, have your blood pressure checked every 3-5 years. If you are 36 years of age or older, have your blood pressure checked every year. You should have your blood pressure measured twice-once when you are at a hospital or clinic, and once when you are not at a hospital or clinic. Record the average of the two measurements. To check your blood pressure when you are not at a hospital or clinic, you can use: ? An automated blood pressure machine at a pharmacy. ? A home blood pressure monitor.  If  you are between 81 years and 46 years old, ask your health care provider if you should take aspirin to prevent strokes.  Have regular diabetes screenings. This involves taking a blood sample to check your fasting blood sugar level. ? If you are at a normal weight and have a low risk for diabetes, have this test once every three years after 82 years of age. ? If you are overweight and have a high risk for diabetes, consider being tested at a younger age or more often. Preventing infection Hepatitis B  If you have a higher risk for hepatitis B, you should be screened for this virus. You are considered at high risk for hepatitis B if: ? You were born in a country where hepatitis B is common. Ask your health care provider which countries are considered high risk. ? Your parents were born in a high-risk country, and you have not been immunized against hepatitis B (hepatitis B vaccine). ? You have HIV or AIDS. ? You use needles to inject street drugs. ? You live with someone who has hepatitis B. ? You have had sex with someone who has hepatitis B. ? You get hemodialysis treatment. ? You take certain medicines for conditions, including cancer, organ transplantation, and autoimmune conditions.  Hepatitis C  Blood testing is recommended for: ? Everyone born from [birth year] through 1963. ? Anyone with known risk factors for hepatitis C.  Sexually transmitted infections (STIs)  You should be screened for sexually transmitted infections (STIs) including gonorrhea and chlamydia if: ? You are sexually active and are younger than 82 years of age. ? You are older than 82 years of age and your health care provider tells you that you are at risk for this type of infection. ? Your sexual activity has changed since you were last screened and you are at an increased risk for chlamydia or gonorrhea. Ask your health care provider if you are at risk.  If you do not have HIV, but are at risk, it may be recommended  that you take a prescription medicine daily to prevent HIV infection. This is called pre-exposure prophylaxis (PrEP). You are considered at risk if: ? You are sexually active and do not regularly use condoms or know the HIV status of your partner(s). ? You take drugs by injection. ? You are sexually active with a partner who has HIV.  Talk with your health care provider about whether you are at high risk of being infected with HIV.  If you choose to begin PrEP, you should first be tested for HIV. You should then be tested every 3 months for as long as you are taking PrEP. Pregnancy  If you are premenopausal and you may become pregnant, ask your health care provider about preconception counseling.  If you may become pregnant, take 400 to 800 micrograms (mcg) of folic acid every day.  If you want to prevent pregnancy, talk to your health care provider about birth control (contraception). Osteoporosis and menopause  Osteoporosis is a disease in which the bones lose minerals and strength with aging. This can result in serious bone fractures. Your risk for osteoporosis can be identified using a bone density scan.  If you are 8 years of age or older, or if you are at risk for osteoporosis and fractures, ask your health care provider if you should be screened.  Ask your health care provider whether you should take a calcium or vitamin D supplement to lower your risk for osteoporosis.  Menopause may have certain physical symptoms and risks.  Hormone replacement therapy may reduce some of these symptoms and risks. Talk to your health care provider about whether hormone replacement therapy is right for you. Follow these instructions at home:  Schedule regular health, dental, and eye exams.  Stay current with your immunizations.  Do not use any tobacco products including cigarettes, chewing tobacco, or electronic cigarettes.  If you are pregnant, do not drink alcohol.  If you are  breastfeeding, limit how much and how often you drink alcohol.  Limit alcohol intake to no more than 1 drink per day for nonpregnant women. One drink equals 12 ounces of beer, 5 ounces of wine, or 1 ounces of hard liquor.  Do not use street drugs.  Do not share needles.  Ask your health care provider for help if you need support or information about quitting drugs.  Tell your health care provider if you often feel depressed.  Tell your health care provider if you have ever been abused or do not feel safe at home. This information is not intended to replace advice given to you by your health care provider. Make sure you discuss any questions you have with your health care provider. Document Released: 07/01/2011 Document Revised: 05/23/2016 Document Reviewed: 09/19/2015 Elsevier Interactive Patient Education  [employer].

## 2018-04-02 NOTE — Assessment & Plan Note (Signed)
Chronic, stable. Continue pravastatin.  The ASCVD Risk score Mikey Bussing DC Jr., et al., 2013) failed to calculate for the following reasons:   The 2013 ASCVD risk score is only valid for ages 52 to 37

## 2018-04-02 NOTE — Assessment & Plan Note (Signed)
Chronic, adequate control. Continue current regimen.  

## 2018-04-04 ENCOUNTER — Encounter: Payer: Self-pay | Admitting: Family Medicine

## 2018-04-04 DIAGNOSIS — M11232 Other chondrocalcinosis, left wrist: Secondary | ICD-10-CM | POA: Insufficient documentation

## 2018-04-05 NOTE — Progress Notes (Signed)
I reviewed health advisor's note, was available for consultation, and agree with documentation and plan.  

## 2018-05-04 IMAGING — DX DG WRIST COMPLETE 3+V*L*
4 series · 4 of 4 positions shown · non-contrast
Comparison: None.

CLINICAL DATA: 87-year-old female with persistent scaphoid pain
after fall 10 months ago.

EXAM:
LEFT WRIST - COMPLETE 3+ VIEW

[wrist ap]
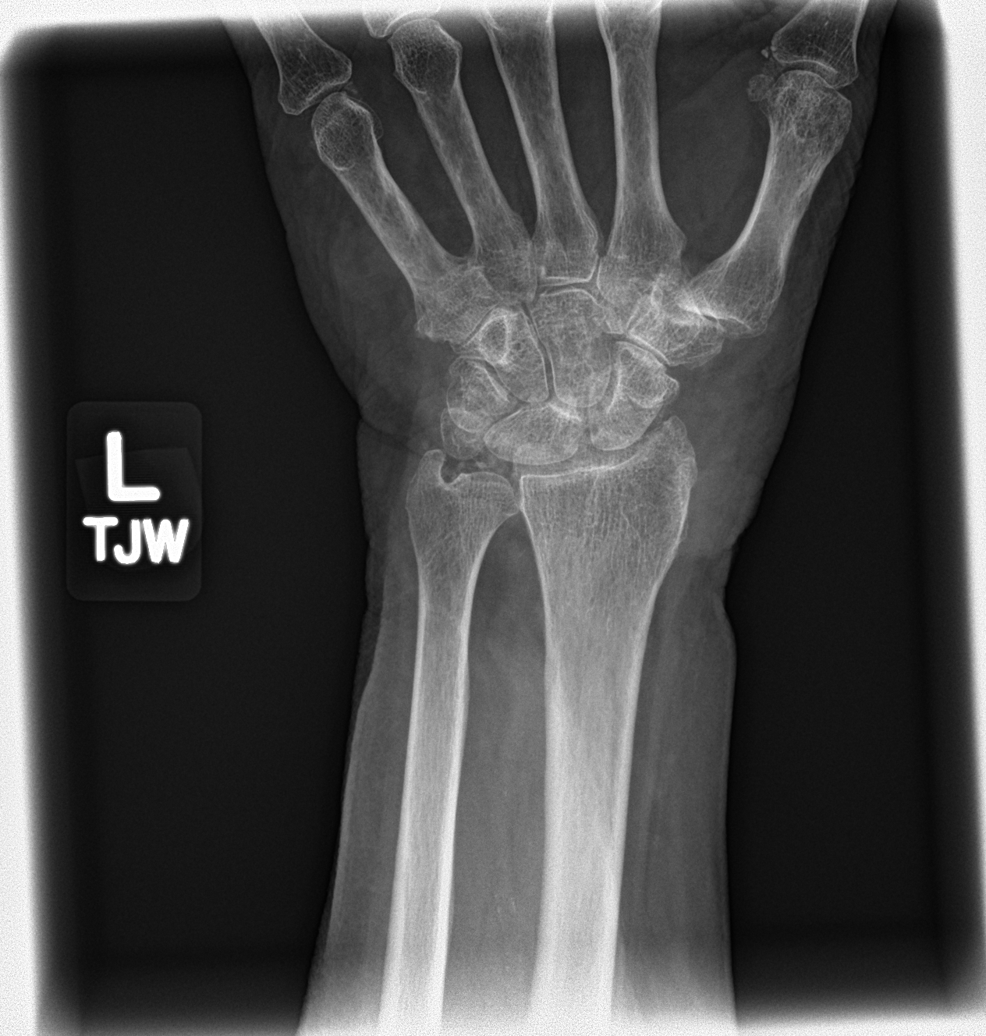

[wrist obl]
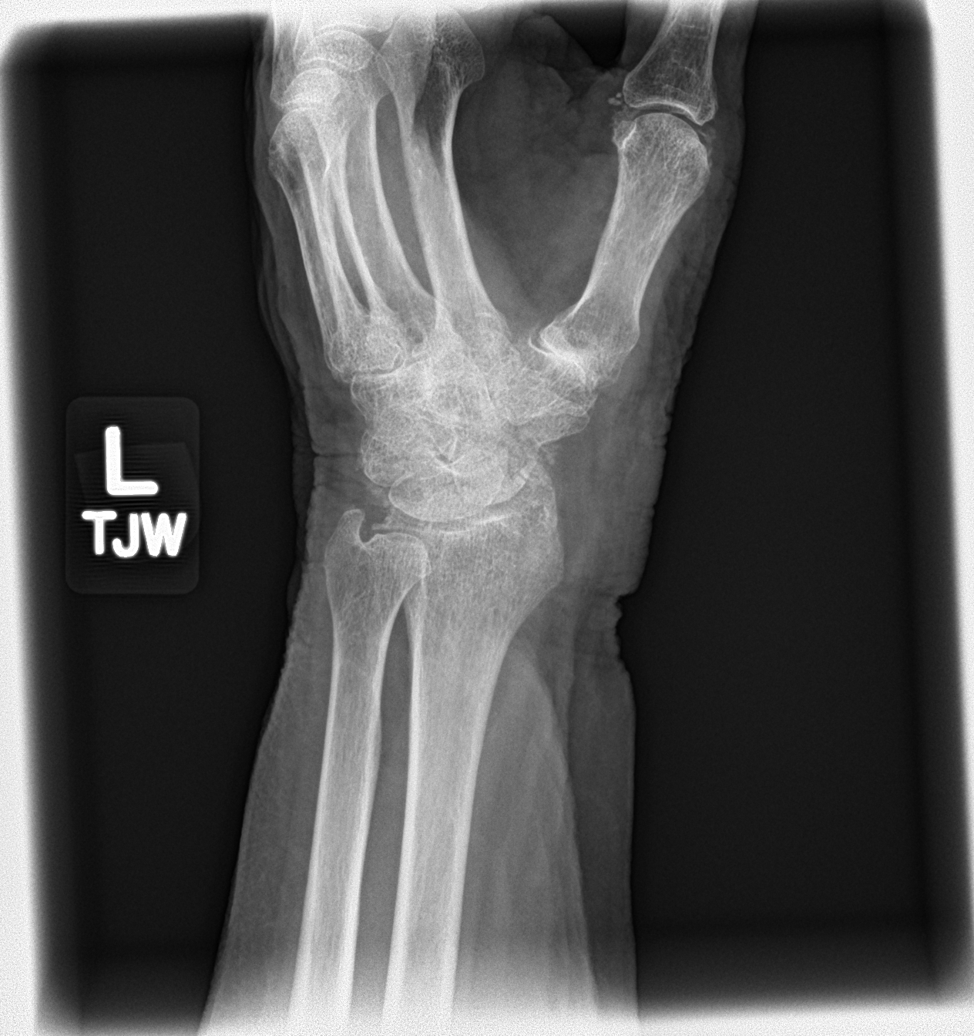

[wrist lat]
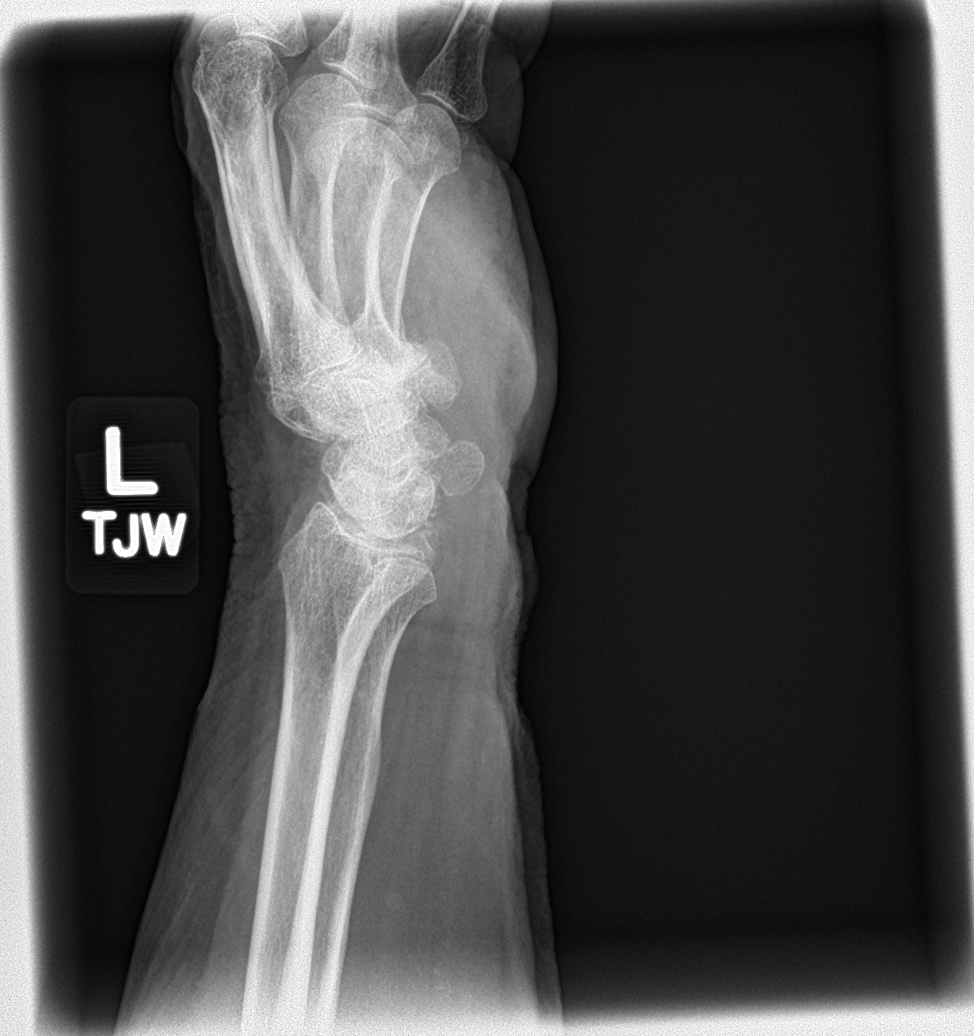

[wrist pa]
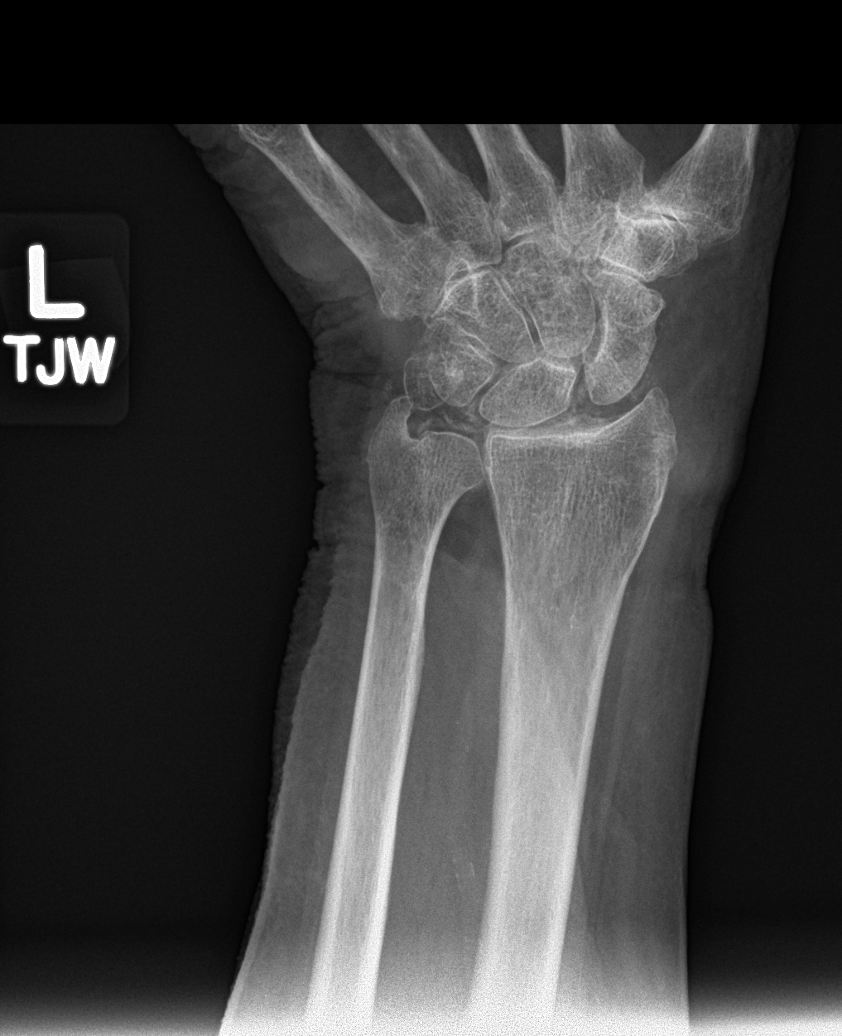

[4 of 4 positions shown; findings below may reference images not displayed]

FINDINGS: Chondrocalcinosis throughout the radiocarpal joint space and TFT see
joint space. Carpal bone alignment appears preserved. Heterogeneous
osteopenia in the wrist, but no definite scaphoid fracture is
identified. There is 1st CMC joint space loss, subchondral
sclerosis, and osteophytosis. The visible metacarpals appear intact.
The distal radius and ulna appear intact.
IMPRESSION: 1. Degenerative changes in the left wrist and at the basal joint of
the left thumb including Chondrocalcinosis, which can be seen in the
setting of calcium pyrophosphate deposition disease.
2. Osteopenia. No scaphoid or other fracture or dislocation
identified.

## 2018-05-31 ENCOUNTER — Other Ambulatory Visit: Payer: Self-pay | Admitting: Family Medicine

## 2018-07-07 ENCOUNTER — Telehealth: Payer: Self-pay | Admitting: *Deleted

## 2018-07-07 NOTE — Telephone Encounter (Signed)
Information has been submitted to pts insurance for verification of benefits. Awaiting response for coverage  

## 2018-08-04 NOTE — Telephone Encounter (Signed)
Spoke to pt and advised of out of pocket expense. Pt states that she has not noticed a difference and with her personal pay responsibility, she is wanting to confirm with Dr Darnell Level that she should proceed, before doing so

## 2018-08-04 NOTE — Telephone Encounter (Signed)
I recommend prolia.

## 2018-08-04 NOTE — Telephone Encounter (Signed)
Verification of benefits have been processed and an approval has been received for pts prolia injection. Pts estimated cost are appx $230. This is only an estimate and cannot be confirmed until benefits are paid. Please advise pt and schedule if needed. If scheduled, once the injection is received, pls contact me back with the date it was received so that I am able to update prolia folder. thanks

## 2018-08-05 ENCOUNTER — Other Ambulatory Visit (INDEPENDENT_AMBULATORY_CARE_PROVIDER_SITE_OTHER): Payer: Medicare Other

## 2018-08-05 DIAGNOSIS — M810 Age-related osteoporosis without current pathological fracture: Secondary | ICD-10-CM

## 2018-08-05 LAB — CALCIUM: CALCIUM: 10.7 mg/dL — AB (ref 8.4–10.5)

## 2018-08-05 NOTE — Telephone Encounter (Signed)
Spoke to pt who agreed to take prolia "one more time and see what happens."  Ca lab scheduled for today and prolia scheduled for 8/22

## 2018-08-05 NOTE — Telephone Encounter (Signed)
Attempted to contact pt unable to leave message.  

## 2018-08-07 NOTE — Telephone Encounter (Signed)
Ca lab elevated. Will wait 1-9mos per PCP and repeat; prolia injection postponed. See results note

## 2018-08-20 ENCOUNTER — Ambulatory Visit: Payer: Medicare Other

## 2018-09-07 NOTE — Telephone Encounter (Signed)
plz have her come in for repeat Calcium level and if back to normal, may proceed with injection. Thanks.

## 2018-09-07 NOTE — Telephone Encounter (Signed)
Pt previously scheduled for 10/7. Is that ok, or do you prefer sept?

## 2018-09-07 NOTE — Telephone Encounter (Signed)
May keep that appt.

## 2018-09-07 NOTE — Telephone Encounter (Signed)
I had a note in my prolia book to contact pt back this month regarding her prolia. Her last Ca lab was elevated. Is Sept ok, or are you wanting her to postpone it an additional month?

## 2018-09-08 ENCOUNTER — Other Ambulatory Visit: Payer: Self-pay | Admitting: Family Medicine

## 2018-09-14 ENCOUNTER — Other Ambulatory Visit: Payer: Self-pay | Admitting: Family Medicine

## 2018-09-29 ENCOUNTER — Other Ambulatory Visit: Payer: Self-pay | Admitting: Family Medicine

## 2018-10-04 ENCOUNTER — Other Ambulatory Visit: Payer: Self-pay | Admitting: Family Medicine

## 2018-10-04 DIAGNOSIS — M810 Age-related osteoporosis without current pathological fracture: Secondary | ICD-10-CM

## 2018-10-04 DIAGNOSIS — Z0000 Encounter for general adult medical examination without abnormal findings: Secondary | ICD-10-CM

## 2018-10-05 ENCOUNTER — Other Ambulatory Visit (INDEPENDENT_AMBULATORY_CARE_PROVIDER_SITE_OTHER): Payer: Medicare Other

## 2018-10-05 DIAGNOSIS — M810 Age-related osteoporosis without current pathological fracture: Secondary | ICD-10-CM

## 2018-10-06 LAB — PTH, INTACT AND CALCIUM
CALCIUM: 10.9 mg/dL — AB (ref 8.6–10.4)
PTH: 25 pg/mL (ref 14–64)

## 2018-10-09 ENCOUNTER — Other Ambulatory Visit: Payer: Self-pay | Admitting: Family Medicine

## 2018-10-14 ENCOUNTER — Ambulatory Visit (INDEPENDENT_AMBULATORY_CARE_PROVIDER_SITE_OTHER): Payer: Medicare Other

## 2018-10-14 ENCOUNTER — Ambulatory Visit: Payer: Medicare Other | Admitting: Family Medicine

## 2018-10-14 DIAGNOSIS — Z23 Encounter for immunization: Secondary | ICD-10-CM

## 2018-10-17 NOTE — Addendum Note (Signed)
Addended by: [name] on: 10/17/2018 11:12 AM   Modules accepted: Orders

## 2018-11-09 ENCOUNTER — Other Ambulatory Visit (INDEPENDENT_AMBULATORY_CARE_PROVIDER_SITE_OTHER): Payer: Medicare Other

## 2018-11-09 LAB — BASIC METABOLIC PANEL
BUN: 21 mg/dL (ref 6–23)
CHLORIDE: 102 meq/L (ref 96–112)
CO2: 30 mEq/L (ref 19–32)
CREATININE: 1.36 mg/dL — AB (ref 0.40–1.20)
Calcium: 10.8 mg/dL — ABNORMAL HIGH (ref 8.4–10.5)
GFR: 39.14 mL/min — ABNORMAL LOW (ref >60.00)
GLUCOSE: 88 mg/dL (ref 70–99)
Potassium: 3.7 mEq/L (ref 3.5–5.1)
Sodium: 140 mEq/L (ref 135–145)

## 2018-11-19 LAB — PTH-RELATED PEPTIDE: PTH-RELATED PROTEIN (PTH-RP): 18 pg/mL (ref 14–27)

## 2018-11-19 LAB — VITAMIN D 1,25 DIHYDROXY
Vitamin D 1, 25 (OH)2 Total: 21 pg/mL (ref 18–72)
Vitamin D2 1, 25 (OH)2: <8 pg/mL
Vitamin D3 1, 25 (OH)2: 21 pg/mL

## 2018-11-20 ENCOUNTER — Telehealth: Payer: Self-pay | Admitting: Family Medicine

## 2018-11-20 NOTE — Telephone Encounter (Signed)
Patient called wanting to know the lab results that was done 11/09/2018. Please advise.

## 2018-11-23 NOTE — Telephone Encounter (Signed)
See lab result note.

## 2018-11-23 NOTE — Telephone Encounter (Signed)
Spoke with pt. See result note.  °

## 2018-11-29 NOTE — Progress Notes (Signed)
BP 140/76 (BP Location: Left Arm, Patient Position: Sitting, Cuff Size: Large)   Pulse 67   Temp 97.8 F (36.6 C) (Oral)   Ht 5\' 4"  (1.626 m)   Wt 226 lb 4 oz (102.6 kg)   SpO2 97%   BMI 38.84 kg/m    CC: review lab results Subjective:    Patient ID: [name], female    DOB: 01-18-1930, 84 y.o.   MRN: [id number]  HPI: [name] is a 84 y.o. female presenting on 11/30/2018 for Results (Here to discuss abnormal kidney function and calcium lab results.); Discuss Medication (Wants to discuss Zantac. ); and Skin Lesions (Has skin lesions on right side of face close to ear and some on the back she wants checked. )   Renal function GFR has ranged 30-40s over the last several years. She received prolia x2 injections 2018. We have held latest injection due to ongoing mild hypercalcemia with normal PTH, normal PTHRP, normal vit D levels. Renal US 09/2016 consistent with medical renal disease without hydronephrosis. She doesn't take vitamin A. She is holding her calcium tablets.   Pseudogout of L wrist by xray earlier this year. No recent flare.   Pt denies cough, chest pain or significant dyspnea.  Notes decreased energy. She uses walker regularly - due to exertional dyspnea. Weight largely stable. Appetite ok.  Strong fmhx lung cancer (smokers and non smokers).   Relevant past medical, surgical, family and social history reviewed and updated as indicated. Interim medical history since our last visit reviewed. Allergies and medications reviewed and updated. Outpatient Medications Prior to Visit  Medication Sig Dispense Refill  . acetaminophen (TYLENOL) 500 MG tablet Take 1,000 mg by mouth daily as needed.     . cholecalciferol (VITAMIN D) 1000 UNITS tablet Take 1,000 Units by mouth daily.    Marland Kitchen diltiazem (TIAZAC) 240 MG 24 hr capsule TAKE 1 CAPSULE BY MOUTH EVERY DAY 90 capsule 1  . diphenhydramine-acetaminophen (TYLENOL PM) 25-500 MG TABS Take 2 tablets by mouth at bedtime as  needed.    Marland Kitchen FLUoxetine (PROZAC) 20 MG tablet TAKE 1 TABLET BY MOUTH EVERY DAY 90 tablet 1  . hydrochlorothiazide (HYDRODIURIL) 25 MG tablet Take 1 tablet (25 mg total) by mouth daily. 90 tablet 3  . levothyroxine (SYNTHROID, LEVOTHROID) 88 MCG tablet TAKE 1 TABLET BY MOUTH EVERY DAY BEFORE BREAKFAST 90 tablet 2  . lisinopril (PRINIVIL,ZESTRIL) 40 MG tablet TAKE 1 TABLET BY MOUTH TWICE A DAY 180 tablet 2  . loratadine (CLARITIN) 10 MG tablet Take 10 mg by mouth daily.    . pravastatin (PRAVACHOL) 40 MG tablet TAKE 1 TABLET BY MOUTH EVERY DAY 90 tablet 2  . ranitidine (ZANTAC) 150 MG tablet Take 150 mg by mouth at bedtime.     . vitamin B-12 (CYANOCOBALAMIN) 500 MCG tablet Take 1 tablet (500 mcg total) by mouth daily.    . Calcium Carbonate-Vitamin D (CALCIUM 600+D) 600-400 MG-UNIT tablet Take 1 tablet by mouth daily. (Patient not taking: Reported on 11/30/2018)     No facility-administered medications prior to visit.      Per HPI unless specifically indicated in ROS section below Review of Systems     Objective:    BP 140/76 (BP Location: Left Arm, Patient Position: Sitting, Cuff Size: Large)   Pulse 67   Temp 97.8 F (36.6 C) (Oral)   Ht 5\' 4"  (1.626 m)   Wt 226 lb 4 oz (102.6 kg)   SpO2 97%  BMI 38.84 kg/m   Wt Readings from Last 3 Encounters:  11/30/18 226 lb 4 oz (102.6 kg)  04/02/18 230 lb (104.3 kg)  03/31/18 230 lb 4 oz (104.4 kg)    Physical Exam  Constitutional: She appears well-developed and well-nourished. No distress.  HENT:  Mouth/Throat: Oropharynx is clear and moist. No oropharyngeal exudate.  Eyes: Pupils are equal, round, and reactive to light. EOM are normal.  Cardiovascular: Normal rate and regular rhythm.  Murmur (2/6 mild systolic) heard. Pulmonary/Chest: Effort normal and breath sounds normal. No respiratory distress. She has no wheezes. She has no rales.  Lungs overall clear  Musculoskeletal: She exhibits no edema.  Skin: Skin is warm. No rash noted.   Several benign skin growths with stuck on appearance throughout back Irregular dark macule R lower back as well as smaller macule L upper back with darker specs  Nursing note and vitals reviewed.  Results for orders placed or performed in visit on 11/09/18  Vitamin D 1,25 dihydroxy  Result Value Ref Range   Vitamin D 1, 25 (OH)2 Total 21 18 - 72 pg/mL   Vitamin D3 1, 25 (OH)2 21 pg/mL   Vitamin D2 1, 25 (OH)2 <8 pg/mL  PTH-Related Peptide  Result Value Ref Range   PTH-Related Protein (PTH-RP) 18 14 - 27 pg/mL  Basic metabolic panel  Result Value Ref Range   Sodium 140 135 - 145 mEq/L   Potassium 3.7 3.5 - 5.1 mEq/L   Chloride 102 96 - 112 mEq/L   CO2 30 19 - 32 mEq/L   Glucose, Bld 88 70 - 99 mg/dL   BUN 21 6 - 23 mg/dL   Creatinine, Ser 1.36 (H) 0.40 - 1.20 mg/dL   Calcium 10.8 (H) 8.4 - 10.5 mg/dL   GFR 39.14 (L) >60.00 mL/min   Lab Results  Component Value Date   TSH 1.84 03/31/2018       Assessment & Plan:   Problem List Items Addressed This Visit    Osteoporosis    prolia on hold due to persistent hypercalcemia.       Hypertension    Chronic, stable on current regimen of lisinopril 40mg  bid, hctz 25mg  daily and diltiazem 240mg  daily.       Hypercalcemia - Primary    Ongoing mildly elevated, intermittently over the past 1-2 years. Workup to date has been reassuring (SPEP 2017, PTH, PTHRP, 25 OH-vit D and 1,25 diOH-vit D. No known fmhx hypercalcemia, not consistent with familial hypocalciuric hypercalcemia. Will update SPEP, UPEP and free light chains as well as check ionized calcium. Consider renal eval if ongoing. ?milk-alkali syndrome although she doesn't seem to have significant calcium intake in diet. Consider discontinuing hctz.  Calcium supplement has been on hold for last several months.       Relevant Orders   Serum protein electrophoresis with reflex   Kappa/lambda light chains   Calcium, ionized   Renal function panel   Protein Electrophoresis,Random  Urn   Family history of lung cancer    Strong fmhx. Update CXR today.      Relevant Orders   DG Chest 2 View (Completed)   CKD (chronic kidney disease) stage 3, GFR 30-59 ml/min (HCC)    GFR dropped and stable between 30s and 40s over last 3 years (previously 50-60s) She has completed stable renal US and SPEP 2017.  PTH normal.  Will recheck SPEP. Consider renal referral.       Atypical nevi    SKs throughout look  benign however noted atypical irregular looking mole R lower back - will refer to derm for this as well as to check L upper back mole      Relevant Orders   Ambulatory referral to Dermatology   Protein Electrophoresis,Random Urn       No orders of the defined types were placed in this encounter.  Orders Placed This Encounter  Procedures  . DG Chest 2 View    Standing Status:   Future    Number of Occurrences:   1    Standing Expiration Date:   02/01/2020    Order Specific Question:   Reason for Exam (SYMPTOM  OR DIAGNOSIS REQUIRED)    Answer:   fmhx lung cancer    Order Specific Question:   Preferred imaging location?    Answer:   [facility]    Order Specific Question:   Radiology Contrast Protocol - do NOT remove file path    Answer:   [email or web address]  . Serum protein electrophoresis with reflex  . Kappa/lambda light chains  . Calcium, ionized  . Renal function panel  . Protein Electrophoresis,Random Urn  . Ambulatory referral to Dermatology    Referral Priority:   Routine    Referral Type:   Consultation    Referral Reason:   Specialty Services Required    Requested Specialty:   Dermatology    Number of Visits Requested:   1    Follow up plan: Return if symptoms worsen or fail to improve.  [name], MD

## 2018-11-30 ENCOUNTER — Ambulatory Visit (INDEPENDENT_AMBULATORY_CARE_PROVIDER_SITE_OTHER): Payer: Medicare Other | Admitting: Family Medicine

## 2018-11-30 ENCOUNTER — Ambulatory Visit (INDEPENDENT_AMBULATORY_CARE_PROVIDER_SITE_OTHER)
Admission: RE | Admit: 2018-11-30 | Discharge: 2018-11-30 | Disposition: A | Discharge status: Home / Self Care | Payer: Medicare Other | Source: Ambulatory Visit | Attending: Family Medicine | Admitting: Family Medicine

## 2018-11-30 ENCOUNTER — Encounter: Payer: Self-pay | Admitting: Family Medicine

## 2018-11-30 DIAGNOSIS — N183 Chronic kidney disease, stage 3 unspecified: Secondary | ICD-10-CM

## 2018-11-30 DIAGNOSIS — Z801 Family history of malignant neoplasm of trachea, bronchus and lung: Secondary | ICD-10-CM | POA: Insufficient documentation

## 2018-11-30 DIAGNOSIS — D229 Melanocytic nevi, unspecified: Secondary | ICD-10-CM | POA: Insufficient documentation

## 2018-11-30 DIAGNOSIS — I10 Essential (primary) hypertension: Secondary | ICD-10-CM | POA: Diagnosis not present

## 2018-11-30 DIAGNOSIS — M810 Age-related osteoporosis without current pathological fracture: Secondary | ICD-10-CM

## 2018-11-30 NOTE — Assessment & Plan Note (Addendum)
Ongoing mildly elevated, intermittently over the past 1-2 years. Workup to date has been reassuring (SPEP 2017, PTH, PTHRP, 25 OH-vit D and 1,25 diOH-vit D. No known fmhx hypercalcemia, not consistent with familial hypocalciuric hypercalcemia. Will update SPEP, UPEP and free light chains as well as check ionized calcium. Consider renal eval if ongoing. ?milk-alkali syndrome although she doesn't seem to have significant calcium intake in diet. Consider discontinuing hctz.  Calcium supplement has been on hold for last several months.

## 2018-11-30 NOTE — Patient Instructions (Addendum)
Take pepcid (famotidine) 20mg  in place of zantac (ranitidine) 150mg .  Most spots look benign. But we will refer you to dermatology to check 2 irregular moles on back. See our referral coordinators for this.  Pass by lab for urine and blood test and xray today for further evaluation of high calcium levels.

## 2018-11-30 NOTE — Assessment & Plan Note (Signed)
prolia on hold due to persistent hypercalcemia.

## 2018-11-30 NOTE — Assessment & Plan Note (Addendum)
Chronic, stable on current regimen of lisinopril 40mg  bid, hctz 25mg  daily and diltiazem 240mg  daily.

## 2018-11-30 NOTE — Assessment & Plan Note (Addendum)
GFR dropped and stable between 30s and 40s over last 3 years (previously 50-60s) She has completed stable renal US and SPEP 2017.  PTH normal.  Will recheck SPEP. Consider renal referral.

## 2018-12-01 ENCOUNTER — Other Ambulatory Visit (INDEPENDENT_AMBULATORY_CARE_PROVIDER_SITE_OTHER): Payer: Medicare Other

## 2018-12-01 LAB — RENAL FUNCTION PANEL
ALBUMIN: 4.2 g/dL (ref 3.5–5.2)
BUN: 19 mg/dL (ref 6–23)
CHLORIDE: 104 meq/L (ref 96–112)
CO2: 25 meq/L (ref 19–32)
Calcium: 10.5 mg/dL (ref 8.4–10.5)
Creatinine, Ser: 1.27 mg/dL — ABNORMAL HIGH (ref 0.40–1.20)
GFR: 42.36 mL/min — ABNORMAL LOW (ref >60.00)
Glucose, Bld: 89 mg/dL (ref 70–99)
Phosphorus: 3.4 mg/dL (ref 2.3–4.6)
Potassium: 3.9 mEq/L (ref 3.5–5.1)
Sodium: 139 mEq/L (ref 135–145)

## 2018-12-01 LAB — ALKALINE PHOSPHATASE: Alkaline Phosphatase: 46 U/L (ref 39–117)

## 2018-12-01 NOTE — Assessment & Plan Note (Signed)
Strong fmhx. Update CXR today.

## 2018-12-01 NOTE — Assessment & Plan Note (Addendum)
SKs throughout look benign however noted atypical irregular looking mole R lower back - will refer to derm for this as well as to check L upper back mole

## 2018-12-02 ENCOUNTER — Other Ambulatory Visit (INDEPENDENT_AMBULATORY_CARE_PROVIDER_SITE_OTHER): Payer: Medicare Other

## 2018-12-02 LAB — PROTEIN ELECTROPHORESIS,RANDOM URN
Creatinine, Urine: 57 mg/dL (ref 20–275)
PROTEIN/CREATININE RATIO: 0.123 mg/mg{creat} (ref 0.021–0.16)
Protein/Creat Ratio: 123 mg/g creat (ref 21–161)
TOTAL PROTEIN, URINE: 7 mg/dL (ref 5–24)

## 2018-12-02 LAB — PHOSPHORUS: Phosphorus: 3.5 mg/dL (ref 2.3–4.6)

## 2018-12-03 LAB — PROTEIN ELECTROPHORESIS, SERUM, WITH REFLEX
ALPHA 2: 0.7 g/dL (ref 0.5–0.9)
Albumin ELP: 4 g/dL (ref 3.8–4.8)
Alpha 1: 0.3 g/dL (ref 0.2–0.3)
Beta 2: 0.4 g/dL (ref 0.2–0.5)
Beta Globulin: 0.5 g/dL (ref 0.4–0.6)
Gamma Globulin: 1.2 g/dL (ref 0.8–1.7)
Total Protein: 7.2 g/dL (ref 6.1–8.1)

## 2018-12-03 LAB — CALCIUM, IONIZED: Calcium, Ion: 5.69 mg/dL — ABNORMAL HIGH (ref 4.8–5.6)

## 2018-12-03 LAB — KAPPA/LAMBDA LIGHT CHAINS
KAPPA FREE LGHT CHN: 37.7 mg/L — AB (ref 3.3–19.4)
KAPPA LAMBDA RATIO: 1.73 — AB (ref 0.26–1.65)
LAMBDA FREE LGHT CHN: 21.8 mg/L (ref 5.7–26.3)

## 2018-12-03 LAB — IFE INTERPRETATION: IMMUNOFIX ELECTR INT: NOT DETECTED

## 2018-12-08 ENCOUNTER — Other Ambulatory Visit: Payer: Self-pay | Admitting: Family Medicine

## 2018-12-08 DIAGNOSIS — R768 Other specified abnormal immunological findings in serum: Secondary | ICD-10-CM

## 2018-12-18 ENCOUNTER — Telehealth: Payer: Self-pay | Admitting: Internal Medicine

## 2018-12-18 ENCOUNTER — Encounter: Payer: Self-pay | Admitting: Internal Medicine

## 2018-12-18 NOTE — Telephone Encounter (Signed)
A new hematology appointment has been scheduled for the pt to see Dr.  Walden Field on 01/08/19 at 9:50am. Unable to reach the pt bc her vm has not been set up. Will notify the referring office for them to contact the pt. Letter mailed.

## 2018-12-21 DIAGNOSIS — C44319 Basal cell carcinoma of skin of other parts of face: Secondary | ICD-10-CM | POA: Diagnosis not present

## 2018-12-21 DIAGNOSIS — L821 Other seborrheic keratosis: Secondary | ICD-10-CM | POA: Diagnosis not present

## 2018-12-21 DIAGNOSIS — D225 Melanocytic nevi of trunk: Secondary | ICD-10-CM | POA: Diagnosis not present

## 2019-01-08 ENCOUNTER — Other Ambulatory Visit: Payer: Self-pay

## 2019-01-08 ENCOUNTER — Encounter: Payer: Self-pay | Admitting: Internal Medicine

## 2019-01-08 ENCOUNTER — Inpatient Hospital Stay: Payer: Medicare Other

## 2019-01-08 ENCOUNTER — Inpatient Hospital Stay: Payer: Medicare Other | Attending: Internal Medicine | Admitting: Internal Medicine

## 2019-01-08 VITALS — BP 174/72 | HR 89 | Temp 97.7°F | Resp 18 | Ht 64.0 in | Wt 224.0 lb

## 2019-01-08 DIAGNOSIS — R0602 Shortness of breath: Secondary | ICD-10-CM | POA: Insufficient documentation

## 2019-01-08 DIAGNOSIS — Z801 Family history of malignant neoplasm of trachea, bronchus and lung: Secondary | ICD-10-CM | POA: Diagnosis not present

## 2019-01-08 DIAGNOSIS — Z79899 Other long term (current) drug therapy: Secondary | ICD-10-CM | POA: Diagnosis not present

## 2019-01-08 DIAGNOSIS — N183 Chronic kidney disease, stage 3 (moderate): Secondary | ICD-10-CM | POA: Diagnosis not present

## 2019-01-08 DIAGNOSIS — Z800 Family history of malignant neoplasm of digestive organs: Secondary | ICD-10-CM | POA: Diagnosis not present

## 2019-01-08 DIAGNOSIS — I129 Hypertensive chronic kidney disease with stage 1 through stage 4 chronic kidney disease, or unspecified chronic kidney disease: Secondary | ICD-10-CM | POA: Insufficient documentation

## 2019-01-08 DIAGNOSIS — R769 Abnormal immunological finding in serum, unspecified: Secondary | ICD-10-CM | POA: Diagnosis not present

## 2019-01-08 DIAGNOSIS — E039 Hypothyroidism, unspecified: Secondary | ICD-10-CM | POA: Insufficient documentation

## 2019-01-08 DIAGNOSIS — Z87891 Personal history of nicotine dependence: Secondary | ICD-10-CM | POA: Insufficient documentation

## 2019-01-08 DIAGNOSIS — F329 Major depressive disorder, single episode, unspecified: Secondary | ICD-10-CM

## 2019-01-08 DIAGNOSIS — R778 Other specified abnormalities of plasma proteins: Secondary | ICD-10-CM

## 2019-01-08 DIAGNOSIS — E785 Hyperlipidemia, unspecified: Secondary | ICD-10-CM | POA: Diagnosis not present

## 2019-01-08 DIAGNOSIS — M810 Age-related osteoporosis without current pathological fracture: Secondary | ICD-10-CM | POA: Diagnosis not present

## 2019-01-08 DIAGNOSIS — M129 Arthropathy, unspecified: Secondary | ICD-10-CM | POA: Insufficient documentation

## 2019-01-08 LAB — CBC WITH DIFFERENTIAL/PLATELET
Abs Immature Granulocytes: 0.01 10*3/uL (ref 0.00–0.07)
Basophils Absolute: 0.1 10*3/uL (ref 0.0–0.1)
Basophils Relative: 1 %
Eosinophils Absolute: 0.1 10*3/uL (ref 0.0–0.5)
Eosinophils Relative: 2 %
HEMATOCRIT: 37.5 % (ref 36.0–46.0)
Hemoglobin: 11.8 g/dL — ABNORMAL LOW (ref 12.0–15.0)
Immature Granulocytes: 0 %
LYMPHS PCT: 23 %
Lymphs Abs: 1.1 10*3/uL (ref 0.7–4.0)
MCH: 28.5 pg (ref 26.0–34.0)
MCHC: 31.5 g/dL (ref 30.0–36.0)
MCV: 90.6 fL (ref 80.0–100.0)
MONO ABS: 0.4 10*3/uL (ref 0.1–1.0)
MONOS PCT: 9 %
NEUTROS ABS: 3.1 10*3/uL (ref 1.7–7.7)
Neutrophils Relative %: 65 %
Platelets: 213 10*3/uL (ref 150–400)
RBC: 4.14 MIL/uL (ref 3.87–5.11)
RDW: 12.6 % (ref 11.5–15.5)
WBC: 4.7 10*3/uL (ref 4.0–10.5)
nRBC: 0 % (ref 0.0–0.2)

## 2019-01-08 LAB — COMPREHENSIVE METABOLIC PANEL
ALBUMIN: 3.9 g/dL (ref 3.5–5.0)
ALT: 11 U/L (ref 0–44)
AST: 19 U/L (ref 15–41)
Alkaline Phosphatase: 69 U/L (ref 38–126)
Anion gap: 8 (ref 5–15)
BILIRUBIN TOTAL: 0.6 mg/dL (ref 0.3–1.2)
BUN: 14 mg/dL (ref 8–23)
CHLORIDE: 107 mmol/L (ref 98–111)
CO2: 27 mmol/L (ref 22–32)
Calcium: 10.7 mg/dL — ABNORMAL HIGH (ref 8.9–10.3)
Creatinine, Ser: 1.31 mg/dL — ABNORMAL HIGH (ref 0.44–1.00)
GFR calc Af Amer: 43 mL/min — ABNORMAL LOW (ref >60)
GFR calc non Af Amer: 37 mL/min — ABNORMAL LOW (ref >60)
GLUCOSE: 87 mg/dL (ref 70–99)
POTASSIUM: 4 mmol/L (ref 3.5–5.1)
Sodium: 142 mmol/L (ref 135–145)
Total Protein: 7.6 g/dL (ref 6.5–8.1)

## 2019-01-08 LAB — FERRITIN: FERRITIN: 34 ng/mL (ref 11–307)

## 2019-01-08 LAB — LACTATE DEHYDROGENASE: LDH: 208 U/L — ABNORMAL HIGH (ref 98–192)

## 2019-01-08 NOTE — Progress Notes (Signed)
Referring Physician:  Dr. [name] of [place]  Diagnosis Abnormal serum protein test - Plan: CBC with Differential/Platelet, Comprehensive metabolic panel, Lactate dehydrogenase, Protein electrophoresis, serum, Ferritin, IgG, IgA, IgM, Kappa/lambda light chains, Calcium, ionized  Hypercalcemia - Plan: CBC with Differential/Platelet, Comprehensive metabolic panel, Lactate dehydrogenase, Protein electrophoresis, serum, Ferritin, IgG, IgA, IgM, Kappa/lambda light chains, Calcium, ionized  Staging Cancer Staging No matching staging information was found for the patient.  Assessment and Plan:  1.  Abnormal lab results.  85 year old female referred for evaluation due to hypercalcemia and FLC elevation.  Pt reports she had been on calcium supplementation for some time.  She has family history of colon cancer in her sister and parents with lung cancer.  She denies smoking.  Labs done 11/30/2018 reviewed and showed Cr 1.27, Ionized calcium of 5.69.  SPEP negative. FLC ratio minimally elevated at 1.73.   Pt reports mild SOB with exertion.  Pt seen today for consultation due to RI and hypercalcemia and abnormal FLC ratio.    Labs done today 01/08/2019 reviewed and showed WBC 4.7 HB 11.8 plts 213,000.  Chemistries WNL with K+ 4 Cr 1.31 and normal LFTs.  Calcium 10.7.  Recent ionized calcium level in 11/2018 was 5.6 which was WNL.  Awaiting results of SPEP, FLC and quant IG.  Pt will RTC in 2 weeks to go over results.  Pt and family member reassured that thus far no significant lab abnormalities are noted but they will follow-up to go over remaining results.  Further recommendations pending lab results.    2.  Hypercalcemia.  Recent ionized calcium done 11/2018 was WNL at 5.6.  Repeating ionized calcium levels today.  Pt reports she had been on long term calcium supplementation that has now been discontinued.    3.  RI.  Cr mildly elevated at 1.31.  Awaiting results of SPEP, FLC and quant IG.   Likely CKD.    4.  Family history of colon cancer and lung cancer.  Sister was treated for CRC. Parents had lung cancer.  Pt denies smoking.  She has undergone colonoscopy evaluation in the past.    5,  SOB.  Pulse ox is 99% on room air.  CXR done 11/30/2018 was negative.  Follow-up with PCP if ongoing symptoms.    6. Osteoporosis.  Pt was treated with Prolia in February 2019.    40 minutes spent with more than 50% spent in review of records, counseling and coordination of care.     HPI:  85 year old female referred for evaluation due to hypercalcemia and FLC elevation.  Pt reports she had been on calcium supplementation for some time.  She has family history of colon cancer in her sister and parents with lung cancer.  She denies smoking.  Labs done 11/30/2018 reviewed and showed Cr 1.27, Ionized calcium of 5.69.  SPEP negative.  Pt reports mild SOB with exertion.  Pt seen today for consultation due to RI and hypercalcemia and abnormal FLC ratio.      Problem List Patient Active Problem List   Diagnosis Date Noted  . Hypercalcemia [E83.52] 11/30/2018  . Atypical nevi [D22.9] 11/30/2018  . Family history of lung cancer [Z80.1] 11/30/2018  . Pseudogout of left wrist [M11.232] 04/04/2018  . Left wrist pain [M25.532] 04/02/2018  . Thoracic back pain [M54.6] 09/23/2016  . Osteoporosis [M81.0] 03/30/2016  . Advanced care planning/counseling discussion [Z71.89] 03/13/2015  . Encounter for general adult medical examination with abnormal findings [B34.19]  03/13/2015  . CKD (chronic kidney disease) stage 3, GFR 30-59 ml/min (HCC) [N18.3] 03/10/2014  . Medicare annual wellness visit, subsequent [Z00.00] 12/17/2012  . CTS (carpal tunnel syndrome) [G56.00] 10/27/2012  . Arthritis [M19.90]   . MDD (major depressive disorder), recurrent episode, moderate ([place]) [F33.1]   . History of ulcer disease [Z87.898]   . Hypothyroidism [E03.9] 03/11/2012  . Hyperlipidemia [E78.5] 03/11/2012  . Hypertension  [I10] 03/11/2012    Past Medical History Past Medical History:  Diagnosis Date  . Arthritis   . Depression   . History of anemia   . History of chicken pox   . History of ulcer disease 1990s  . HLD (hyperlipidemia)   . HTN (hypertension)   . Hypothyroidism   . Osteoporosis 03/2016   at [facility] - T -3.1 hip, -1.0 spine  . Uses hearing aid     Past Surgical History Past Surgical History:  Procedure Laterality Date  . APPENDECTOMY  1974  . BACK SURGERY  1979  . CARDIOVASCULAR STRESS TEST  2010   WNL  . CARPAL TUNNEL RELEASE Left 04/28/2017  . CATARACT EXTRACTION Bilateral 2012  . DEXA  03/2016   at [facility] - T -3.1 hip, -1.0 spine  . GALLBLADDER SURGERY  1974  . [place]   right  . [place]   left  . KNEE SURGERY  2008  . TONSILLECTOMY  1945  . US ECHOCARDIOGRAPHY  2010   LVEF 65-75%, overall normal, mildly decreased LV diastolic compliance    Family History Family History  Problem Relation Age of Onset  . Cancer Mother 13       lung, nonsmoker  . Cancer Father        lung, nonsmoker  . Cancer Brother 82       lung, smoker  . Cancer Sister 7       brain  . Cancer Sister 41       lung, smoker  . COPD Sister        smoker  . Cancer Sister 108       colon  . Hypertension Daughter   . Diabetes Daughter   . CAD Neg Hx   . Stroke Neg Hx      Social History  reports that she quit smoking about 26 years ago. She has never used smokeless tobacco. She reports that she does not drink alcohol or use drugs.  Medications  Current Outpatient Medications:  .  acetaminophen (TYLENOL) 500 MG tablet, Take 1,000 mg by mouth daily as needed. , Disp: , Rfl:  .  cholecalciferol (VITAMIN D) 1000 UNITS tablet, Take 1,000 Units by mouth daily., Disp: , Rfl:  .  diltiazem (TIAZAC) 240 MG 24 hr capsule, TAKE 1 CAPSULE BY MOUTH EVERY DAY, Disp: 90 capsule, Rfl: 1 .  diphenhydramine-acetaminophen (TYLENOL PM) 25-500 MG TABS, Take 2 tablets by mouth at bedtime as  needed., Disp: , Rfl:  .  famotidine (PEPCID) 20 MG tablet, Take 20 mg by mouth daily., Disp: , Rfl:  .  FLUoxetine (PROZAC) 20 MG tablet, TAKE 1 TABLET BY MOUTH EVERY DAY, Disp: 90 tablet, Rfl: 1 .  hydrochlorothiazide (HYDRODIURIL) 25 MG tablet, TAKE 1 TABLET BY MOUTH EVERY DAY, Disp: 90 tablet, Rfl: 1 .  levothyroxine (SYNTHROID, LEVOTHROID) 88 MCG tablet, TAKE 1 TABLET BY MOUTH EVERY DAY BEFORE BREAKFAST, Disp: 90 tablet, Rfl: 2 .  lisinopril (PRINIVIL,ZESTRIL) 40 MG tablet, TAKE 1 TABLET BY MOUTH TWICE A DAY, Disp: 180 tablet,  Rfl: 2 .  loratadine (CLARITIN) 10 MG tablet, Take 10 mg by mouth daily., Disp: , Rfl:  .  pravastatin (PRAVACHOL) 40 MG tablet, TAKE 1 TABLET BY MOUTH EVERY DAY, Disp: 90 tablet, Rfl: 2 .  vitamin B-12 (CYANOCOBALAMIN) 500 MCG tablet, Take 1 tablet (500 mcg total) by mouth daily., Disp: , Rfl:  .  Calcium Carbonate-Vitamin D (CALCIUM 600+D) 600-400 MG-UNIT tablet, Take 1 tablet by mouth daily. (Patient not taking: Reported on 11/30/2018), Disp: , Rfl:  .  ranitidine (ZANTAC) 150 MG tablet, Take 150 mg by mouth at bedtime. , Disp: , Rfl:   Allergies Patient has no known allergies.  Review of Systems Review of Systems - Oncology ROS negative other than SOB   Physical Exam  Vitals Wt Readings from Last 3 Encounters:  01/08/19 224 lb (101.6 kg)  11/30/18 226 lb 4 oz (102.6 kg)  04/02/18 230 lb (104.3 kg)   Temp Readings from Last 3 Encounters:  01/08/19 97.7 F (36.5 C) (Oral)  11/30/18 97.8 F (36.6 C) (Oral)  04/02/18 98 F (36.7 C) (Oral)   BP Readings from Last 3 Encounters:  01/08/19 (!) 174/72  11/30/18 140/76  04/02/18 (!) 146/80   Pulse Readings from Last 3 Encounters:  01/08/19 89  11/30/18 67  04/02/18 78   Constitutional: Elderly female in no distress.   HENT: Head: Normocephalic and atraumatic.  Mouth/Throat: No oropharyngeal exudate. Mucosa moist. Eyes: Pupils are equal, round, and reactive to light. Conjunctivae are normal. No  scleral icterus.  Neck: Normal range of motion. Neck supple. No JVD present.  Cardiovascular: Normal rate, regular rhythm and normal heart sounds.  Exam reveals no gallop and no friction rub.   No murmur heard. Pulmonary/Chest: Effort normal and breath sounds normal. No respiratory distress. No wheezes.No rales.  Abdominal: Soft. Bowel sounds are normal. No distension. There is no tenderness. There is no guarding.  Musculoskeletal: No edema or tenderness.  Lymphadenopathy: No cervical,axillary or supraclavicular adenopathy.  Neurological: Alert and oriented to person, place, and time. No cranial nerve deficit.  Skin: Skin is warm and dry. No rash noted. No erythema. No pallor.  Psychiatric: Affect and judgment normal.   Labs Appointment on 01/08/2019  Component Date Value Ref Range Status  . Sodium 01/08/2019 142  135 - 145 mmol/L Final  . Potassium 01/08/2019 4.0  3.5 - 5.1 mmol/L Final  . Chloride 01/08/2019 107  98 - 111 mmol/L Final  . CO2 01/08/2019 27  22 - 32 mmol/L Final  . Glucose, Bld 01/08/2019 87  70 - 99 mg/dL Final  . BUN 01/08/2019 14  8 - 23 mg/dL Final  . Creatinine, Ser 01/08/2019 1.31* 0.44 - 1.00 mg/dL Final  . Calcium 01/08/2019 10.7* 8.9 - 10.3 mg/dL Final  . Total Protein 01/08/2019 7.6  6.5 - 8.1 g/dL Final  . Albumin 01/08/2019 3.9  3.5 - 5.0 g/dL Final  . AST 01/08/2019 19  15 - 41 U/L Final  . ALT 01/08/2019 11  0 - 44 U/L Final  . Alkaline Phosphatase 01/08/2019 69  38 - 126 U/L Final  . Total Bilirubin 01/08/2019 0.6  0.3 - 1.2 mg/dL Final  . GFR calc non Af Amer 01/08/2019 37* >60 mL/min Final  . GFR calc Af Amer 01/08/2019 43* >60 mL/min Final  . Anion gap 01/08/2019 8  5 - 15 Final   Performed at [facility], [place] [address] [place], [place] [address]  . WBC 01/08/2019 4.7  4.0 -  10.5 K/uL Final  . RBC 01/08/2019 4.14  3.87 - 5.11 MIL/uL Final  . Hemoglobin 01/08/2019 11.8* 12.0 - 15.0 g/dL Final  . HCT 01/08/2019 37.5   36.0 - 46.0 % Final  . MCV 01/08/2019 90.6  80.0 - 100.0 fL Final  . MCH 01/08/2019 28.5  26.0 - 34.0 pg Final  . MCHC 01/08/2019 31.5  30.0 - 36.0 g/dL Final  . RDW 01/08/2019 12.6  11.5 - 15.5 % Final  . Platelets 01/08/2019 213  150 - 400 K/uL Final  . nRBC 01/08/2019 0.0  0.0 - 0.2 % Final  . Neutrophils Relative % 01/08/2019 65  % Final  . Neutro Abs 01/08/2019 3.1  1.7 - 7.7 K/uL Final  . Lymphocytes Relative 01/08/2019 23  % Final  . Lymphs Abs 01/08/2019 1.1  0.7 - 4.0 K/uL Final  . Monocytes Relative 01/08/2019 9  % Final  . Monocytes Absolute 01/08/2019 0.4  0.1 - 1.0 K/uL Final  . Eosinophils Relative 01/08/2019 2  % Final  . Eosinophils Absolute 01/08/2019 0.1  0.0 - 0.5 K/uL Final  . Basophils Relative 01/08/2019 1  % Final  . Basophils Absolute 01/08/2019 0.1  0.0 - 0.1 K/uL Final  . Immature Granulocytes 01/08/2019 0  % Final  . Abs Immature Granulocytes 01/08/2019 0.01  0.00 - 0.07 K/uL Final   Performed at [facility], [place] [address] [place],  [address]     Pathology Orders Placed This Encounter  Procedures  . CBC with Differential/Platelet    Standing Status:   Future    Number of Occurrences:   1    Standing Expiration Date:   01/09/2020  . Comprehensive metabolic panel    Standing Status:   Future    Number of Occurrences:   1    Standing Expiration Date:   01/09/2020  . Lactate dehydrogenase    Standing Status:   Future    Number of Occurrences:   1    Standing Expiration Date:   01/09/2020  . Protein electrophoresis, serum    Standing Status:   Future    Number of Occurrences:   1    Standing Expiration Date:   01/09/2020  . Ferritin    Standing Status:   Future    Number of Occurrences:   1    Standing Expiration Date:   01/09/2020  . IgG, IgA, IgM    Standing Status:   Future    Number of Occurrences:   1    Standing Expiration Date:   01/09/2020  . Kappa/lambda light chains    Standing Status:   Future    Number  of Occurrences:   1    Standing Expiration Date:   01/09/2020  . Calcium, ionized    Standing Status:   Future    Number of Occurrences:   1    Standing Expiration Date:   01/09/2020       Zoila Shutter MD

## 2019-01-09 LAB — IGG, IGA, IGM
IGA: 274 mg/dL (ref 64–422)
IGM (IMMUNOGLOBULIN M), SRM: 91 mg/dL (ref 26–217)
IgG (Immunoglobin G), Serum: 1333 mg/dL (ref 700–1600)

## 2019-01-09 LAB — CALCIUM, IONIZED: Calcium, Ionized, Serum: 5.7 mg/dL — ABNORMAL HIGH (ref 4.5–5.6)

## 2019-01-11 LAB — KAPPA/LAMBDA LIGHT CHAINS
KAPPA FREE LGHT CHN: 33.5 mg/L — AB (ref 3.3–19.4)
KAPPA, LAMDA LIGHT CHAIN RATIO: 1.81 — AB (ref 0.26–1.65)
LAMDA FREE LIGHT CHAINS: 18.5 mg/L (ref 5.7–26.3)

## 2019-01-11 LAB — PROTEIN ELECTROPHORESIS, SERUM
A/G RATIO SPE: 1.1 (ref 0.7–1.7)
ALPHA-2-GLOBULIN: 0.7 g/dL (ref 0.4–1.0)
Albumin ELP: 3.7 g/dL (ref 2.9–4.4)
Alpha-1-Globulin: 0.3 g/dL (ref 0.0–0.4)
BETA GLOBULIN: 1 g/dL (ref 0.7–1.3)
GAMMA GLOBULIN: 1.3 g/dL (ref 0.4–1.8)
Globulin, Total: 3.3 g/dL (ref 2.2–3.9)
Total Protein ELP: 7 g/dL (ref 6.0–8.5)

## 2019-01-22 ENCOUNTER — Telehealth: Payer: Self-pay | Admitting: Internal Medicine

## 2019-01-22 ENCOUNTER — Inpatient Hospital Stay (HOSPITAL_BASED_OUTPATIENT_CLINIC_OR_DEPARTMENT_OTHER): Payer: Medicare Other | Admitting: Internal Medicine

## 2019-01-22 DIAGNOSIS — F329 Major depressive disorder, single episode, unspecified: Secondary | ICD-10-CM

## 2019-01-22 DIAGNOSIS — M810 Age-related osteoporosis without current pathological fracture: Secondary | ICD-10-CM

## 2019-01-22 DIAGNOSIS — Z79899 Other long term (current) drug therapy: Secondary | ICD-10-CM | POA: Diagnosis not present

## 2019-01-22 DIAGNOSIS — I129 Hypertensive chronic kidney disease with stage 1 through stage 4 chronic kidney disease, or unspecified chronic kidney disease: Secondary | ICD-10-CM

## 2019-01-22 DIAGNOSIS — Z801 Family history of malignant neoplasm of trachea, bronchus and lung: Secondary | ICD-10-CM

## 2019-01-22 DIAGNOSIS — R769 Abnormal immunological finding in serum, unspecified: Secondary | ICD-10-CM | POA: Diagnosis not present

## 2019-01-22 DIAGNOSIS — Z800 Family history of malignant neoplasm of digestive organs: Secondary | ICD-10-CM | POA: Diagnosis not present

## 2019-01-22 DIAGNOSIS — E039 Hypothyroidism, unspecified: Secondary | ICD-10-CM

## 2019-01-22 DIAGNOSIS — M129 Arthropathy, unspecified: Secondary | ICD-10-CM

## 2019-01-22 DIAGNOSIS — Z87891 Personal history of nicotine dependence: Secondary | ICD-10-CM

## 2019-01-22 DIAGNOSIS — R0602 Shortness of breath: Secondary | ICD-10-CM

## 2019-01-22 DIAGNOSIS — N183 Chronic kidney disease, stage 3 (moderate): Secondary | ICD-10-CM

## 2019-01-22 DIAGNOSIS — E785 Hyperlipidemia, unspecified: Secondary | ICD-10-CM | POA: Diagnosis not present

## 2019-01-22 NOTE — Progress Notes (Signed)
Diagnosis Hypercalcemia - Plan: CBC with Differential/Platelet, Comprehensive metabolic panel, Lactate dehydrogenase, Calcium, ionized, PTH, intact and calcium  Staging Cancer Staging No matching staging information was found for the patient.  Assessment and Plan:   1.  Hypercalcemia.  85 year old female referred for evaluation due to hypercalcemia and FLC elevation.  Pt reports she had been on calcium supplementation for some time.  She has family history of colon cancer in her sister and parents with lung cancer.  She denies smoking.  Labs done 11/30/2018 reviewed and showed Cr 1.27, Ionized calcium of 5.69.  SPEP negative. FLC ratio minimally elevated at 1.73.   Pt reports mild SOB with exertion.  Pt seen today for consultation due to RI and hypercalcemia and abnormal FLC ratio.    Labs done 01/08/2019 reviewed and showed WBC 4.7 HB 11.8 plts 213,000.  Chemistries WNL with K+ 4 Cr 1.31 and normal LFTs.  Calcium 10.7.  Ionized Calcium is WNL at 5.7.  Recent ionized calcium level in 11/2018 was 5.6 which was WNL.  She has normal SPEP and quant IG.  FLC ratio of 1.81 which is minimally elevated likely due to RI.  Pt and family member reassured that thus far no significant lab abnormalities are noted.  Pt will have PTH checked on RTC in 2 months if calcium remains slightly elevated.  They are recommended to remain off calcium until follow-up labs reviewed.    2.  RI.  Cr mildly elevated at 1.31.  She has normal SPEP and quant IG.  FLC ratio of 1.81 which is minimally elevated likely due to RI.  Will consider nephrology referral on RTC.    3.  Family history of colon cancer and lung cancer.  Sister was treated for CRC. Parents had lung cancer.  Pt denies smoking.  She has undergone colonoscopy evaluation in the past.    4. Osteoporosis.  Pt was treated with Prolia in February 2019.  Follow-up with PCP for ongoing monitoring.    25 minutes spent with more than 50% spent in review of records, counseling  and coordination of care.    Interval History: Historical data obtained from note dated 01/08/2019.   85 year old female referred for evaluation due to hypercalcemia and FLC elevation.  Pt reports she had been on calcium supplementation for some time.  She has family history of colon cancer in her sister and parents with lung cancer.  She denies smoking.  Labs done 11/30/2018 reviewed and showed Cr 1.27, Ionized calcium of 5.69.  SPEP negative.  Pt reports mild SOB with exertion.  Pt seen today for consultation due to RI and hypercalcemia and abnormal FLC ratio.     Current Status:  Pt is seen today for follow-up.  She is here to go over labs.  She is accompanied by Family member.    Problem List Patient Active Problem List   Diagnosis Date Noted  . Hypercalcemia [E83.52] 11/30/2018  . Atypical nevi [D22.9] 11/30/2018  . Family history of lung cancer [Z80.1] 11/30/2018  . Pseudogout of left wrist [M11.232] 04/04/2018  . Left wrist pain [M25.532] 04/02/2018  . Thoracic back pain [M54.6] 09/23/2016  . Osteoporosis [M81.0] 03/30/2016  . Advanced care planning/counseling discussion [Z71.89] 03/13/2015  . Encounter for general adult medical examination with abnormal findings [Z00.01] 03/13/2015  . CKD (chronic kidney disease) stage 3, GFR 30-59 ml/min (HCC) [N18.3] 03/10/2014  . Medicare annual wellness visit, subsequent [Z00.00] 12/17/2012  . CTS (carpal tunnel syndrome) [G56.00] 10/27/2012  .  Arthritis [M19.90]   . MDD (major depressive disorder), recurrent episode, moderate ([place]) [F33.1]   . History of ulcer disease [Z87.898]   . Hypothyroidism [E03.9] 03/11/2012  . Hyperlipidemia [E78.5] 03/11/2012  . Hypertension [I10] 03/11/2012    Past Medical History Past Medical History:  Diagnosis Date  . Arthritis   . Depression   . History of anemia   . History of chicken pox   . History of ulcer disease 1990s  . HLD (hyperlipidemia)   . HTN (hypertension)   . Hypothyroidism   .  Osteoporosis 03/2016   at [place] - T -3.1 hip, -1.0 spine  . Uses hearing aid     Past Surgical History Past Surgical History:  Procedure Laterality Date  . APPENDECTOMY  1974  . BACK SURGERY  1979  . CARDIOVASCULAR STRESS TEST  2010   WNL  . CARPAL TUNNEL RELEASE Left 04/28/2017  . CATARACT EXTRACTION Bilateral 2012  . DEXA  03/2016   at [facility] - T -3.1 hip, -1.0 spine  . GALLBLADDER SURGERY  1974  . [place]   right  . [place]   left  . KNEE SURGERY  2008  . TONSILLECTOMY  1945  . US ECHOCARDIOGRAPHY  2010   LVEF 65-75%, overall normal, mildly decreased LV diastolic compliance    Family History Family History  Problem Relation Age of Onset  . Cancer Mother 3       lung, nonsmoker  . Cancer Father        lung, nonsmoker  . Cancer Brother 81       lung, smoker  . Cancer Sister 58       brain  . Cancer Sister 96       lung, smoker  . COPD Sister        smoker  . Cancer Sister 66       colon  . Hypertension Daughter   . Diabetes Daughter   . CAD Neg Hx   . Stroke Neg Hx      Social History  reports that she quit smoking about 26 years ago. She has never used smokeless tobacco. She reports that she does not drink alcohol or use drugs.  Medications  Current Outpatient Medications:  .  acetaminophen (TYLENOL) 500 MG tablet, Take 1,000 mg by mouth daily as needed. , Disp: , Rfl:  .  cholecalciferol (VITAMIN D) 1000 UNITS tablet, Take 1,000 Units by mouth daily., Disp: , Rfl:  .  diltiazem (TIAZAC) 240 MG 24 hr capsule, TAKE 1 CAPSULE BY MOUTH EVERY DAY, Disp: 90 capsule, Rfl: 1 .  diphenhydramine-acetaminophen (TYLENOL PM) 25-500 MG TABS, Take 2 tablets by mouth at bedtime as needed., Disp: , Rfl:  .  famotidine (PEPCID) 20 MG tablet, Take 20 mg by mouth daily., Disp: , Rfl:  .  FLUoxetine (PROZAC) 20 MG tablet, TAKE 1 TABLET BY MOUTH EVERY DAY, Disp: 90 tablet, Rfl: 1 .  hydrochlorothiazide (HYDRODIURIL) 25 MG tablet, TAKE 1 TABLET BY MOUTH  EVERY DAY, Disp: 90 tablet, Rfl: 1 .  levothyroxine (SYNTHROID, LEVOTHROID) 88 MCG tablet, TAKE 1 TABLET BY MOUTH EVERY DAY BEFORE BREAKFAST, Disp: 90 tablet, Rfl: 2 .  lisinopril (PRINIVIL,ZESTRIL) 40 MG tablet, TAKE 1 TABLET BY MOUTH TWICE A DAY, Disp: 180 tablet, Rfl: 2 .  loratadine (CLARITIN) 10 MG tablet, Take 10 mg by mouth daily., Disp: , Rfl:  .  pravastatin (PRAVACHOL) 40 MG tablet, TAKE 1 TABLET BY MOUTH EVERY DAY, Disp:  90 tablet, Rfl: 2 .  ranitidine (ZANTAC) 150 MG tablet, Take 150 mg by mouth at bedtime. , Disp: , Rfl:  .  vitamin B-12 (CYANOCOBALAMIN) 500 MCG tablet, Take 1 tablet (500 mcg total) by mouth daily., Disp: , Rfl:   Allergies Patient has no known allergies.  Review of Systems Review of Systems - Oncology ROS negative   Physical Exam  Vitals Wt Readings from Last 3 Encounters:  01/22/19 224 lb 1.6 oz (101.7 kg)  01/08/19 224 lb (101.6 kg)  11/30/18 226 lb 4 oz (102.6 kg)   Temp Readings from Last 3 Encounters:  01/22/19 98 F (36.7 C) (Oral)  01/08/19 97.7 F (36.5 C) (Oral)  11/30/18 97.8 F (36.6 C) (Oral)   BP Readings from Last 3 Encounters:  01/22/19 (!) 163/62  01/08/19 (!) 174/72  11/30/18 140/76   Pulse Readings from Last 3 Encounters:  01/22/19 68  01/08/19 89  11/30/18 67    Constitutional: Well-developed, well-nourished, and in no distress.  Pt ambulates with walker.  She is accompanied by family member.   HENT: Head: Normocephalic and atraumatic.  Mouth/Throat: No oropharyngeal exudate. Mucosa moist. Eyes: Pupils are equal, round, and reactive to light. Conjunctivae are normal. No scleral icterus.  Neck: Normal range of motion. Neck supple. No JVD present.  Cardiovascular: Normal rate, regular rhythm and normal heart sounds.  Exam reveals no gallop and no friction rub.   No murmur heard. Pulmonary/Chest: Effort normal and breath sounds normal. No respiratory distress. No wheezes.No rales.  Abdominal: Soft. Bowel sounds are  normal. No distension. There is no tenderness. There is no guarding.  Musculoskeletal: No edema or tenderness.  Lymphadenopathy: No cervical, axillary or supraclavicular adenopathy.  Neurological: Alert and oriented to person, place, and time. No cranial nerve deficit.  Skin: Skin is warm and dry. No rash noted. No erythema. No pallor.  Psychiatric: Affect and judgment normal.   Labs No visits with results within 3 Day(s) from this visit.  Latest known visit with results is:  Appointment on 01/08/2019  Component Date Value Ref Range Status  . Calcium, Ionized, Serum 01/08/2019 5.7* 4.5 - 5.6 mg/dL Final   Comment: (NOTE) Performed At: [facility] [place], [place] [id number] Rush Farmer MD [id number]   . Kappa free light chain 01/08/2019 33.5* 3.3 - 19.4 mg/L Final  . Lamda free light chains 01/08/2019 18.5  5.7 - 26.3 mg/L Final  . Kappa, lamda light chain ratio 01/08/2019 1.81* 0.26 - 1.65 Final   Comment: (NOTE) Performed At: [facility] [place], [place] [id number] Rush Farmer MD [id number]   . IgG (Immunoglobin G), Serum 01/08/2019 1,333  700 - 1,600 mg/dL Final  . IgA 01/08/2019 274  64 - 422 mg/dL Final  . IgM (Immunoglobulin M), Srm 01/08/2019 91  26 - 217 mg/dL Final   Comment: (NOTE) Performed At: [facility] [place], [place] [id number] Rush Farmer MD [id number]   . Ferritin 01/08/2019 34  11 - 307 ng/mL Final   Performed at [facility], [place] [address] [place], [place] [address]  . Total Protein ELP 01/08/2019 7.0  6.0 - 8.5 g/dL Final  . Albumin ELP 01/08/2019 3.7  2.9 - 4.4 g/dL Final  . Alpha-1-Globulin 01/08/2019 0.3  0.0 - 0.4 g/dL Final  . Alpha-2-Globulin 01/08/2019 0.7  0.4 - 1.0 g/dL Final  . Beta Globulin 01/08/2019 1.0  0.7 - 1.3 g/dL Final  . Gamma Globulin 01/08/2019 1.3  0.4 - 1.8 g/dL Final  . M-Spike, % 01/08/2019 Not Observed  Not  Observed g/dL Final  . SPE Interp. 01/08/2019 Comment   Final   Comment: (NOTE) The SPE pattern appears essentially unremarkable. Evidence of monoclonal protein is not apparent. Performed At: [facility] [place], [place] [id number] Rush Farmer MD [id number]   . Comment 01/08/2019 Comment   Final   Comment: (NOTE) Protein electrophoresis scan will follow via computer, mail, or courier delivery.   Marland Kitchen GLOBULIN, TOTAL 01/08/2019 3.3  2.2 - 3.9 g/dL Corrected  . A/G Ratio 01/08/2019 1.1  0.7 - 1.7 Corrected  . LDH 01/08/2019 208* 98 - 192 U/L Final   Performed at [facility], [place] [address], [place], [place] [address]  . Sodium 01/08/2019 142  135 - 145 mmol/L Final  . Potassium 01/08/2019 4.0  3.5 - 5.1 mmol/L Final  . Chloride 01/08/2019 107  98 - 111 mmol/L Final  . CO2 01/08/2019 27  22 - 32 mmol/L Final  . Glucose, Bld 01/08/2019 87  70 - 99 mg/dL Final  . BUN 01/08/2019 14  8 - 23 mg/dL Final  . Creatinine, Ser 01/08/2019 1.31* 0.44 - 1.00 mg/dL Final  . Calcium 01/08/2019 10.7* 8.9 - 10.3 mg/dL Final  . Total Protein 01/08/2019 7.6  6.5 - 8.1 g/dL Final  . Albumin 01/08/2019 3.9  3.5 - 5.0 g/dL Final  . AST 01/08/2019 19  15 - 41 U/L Final  . ALT 01/08/2019 11  0 - 44 U/L Final  . Alkaline Phosphatase 01/08/2019 69  38 - 126 U/L Final  . Total Bilirubin 01/08/2019 0.6  0.3 - 1.2 mg/dL Final  . GFR calc non Af Amer 01/08/2019 37* >60 mL/min Final  . GFR calc Af Amer 01/08/2019 43* >60 mL/min Final  . Anion gap 01/08/2019 8  5 - 15 Final   Performed at [facility], [place] [address], [place], [place] [address]  . WBC 01/08/2019 4.7  4.0 - 10.5 K/uL Final  . RBC 01/08/2019 4.14  3.87 - 5.11 MIL/uL Final  . Hemoglobin 01/08/2019 11.8* 12.0 - 15.0 g/dL Final  . HCT 01/08/2019 37.5  36.0 - 46.0 % Final  . MCV 01/08/2019 90.6  80.0 - 100.0 fL Final  . MCH 01/08/2019 28.5  26.0 - 34.0 pg Final  . MCHC  01/08/2019 31.5  30.0 - 36.0 g/dL Final  . RDW 01/08/2019 12.6  11.5 - 15.5 % Final  . Platelets 01/08/2019 213  150 - 400 K/uL Final  . nRBC 01/08/2019 0.0  0.0 - 0.2 % Final  . Neutrophils Relative % 01/08/2019 65  % Final  . Neutro Abs 01/08/2019 3.1  1.7 - 7.7 K/uL Final  . Lymphocytes Relative 01/08/2019 23  % Final  . Lymphs Abs 01/08/2019 1.1  0.7 - 4.0 K/uL Final  . Monocytes Relative 01/08/2019 9  % Final  . Monocytes Absolute 01/08/2019 0.4  0.1 - 1.0 K/uL Final  . Eosinophils Relative 01/08/2019 2  % Final  . Eosinophils Absolute 01/08/2019 0.1  0.0 - 0.5 K/uL Final  . Basophils Relative 01/08/2019 1  % Final  . Basophils Absolute 01/08/2019 0.1  0.0 - 0.1 K/uL Final  . Immature Granulocytes 01/08/2019 0  % Final  . Abs Immature Granulocytes 01/08/2019 0.01  0.00 - 0.07 K/uL Final   Performed at [facility], [place] [address], [place],  [address]     Pathology Orders  Placed This Encounter  Procedures  . CBC with Differential/Platelet    Standing Status:   Future    Standing Expiration Date:   01/23/2020  . Comprehensive metabolic panel    Standing Status:   Future    Standing Expiration Date:   01/23/2020  . Lactate dehydrogenase    Standing Status:   Future    Standing Expiration Date:   01/23/2020  . Calcium, ionized    Standing Status:   Future    Standing Expiration Date:   01/23/2020  . PTH, intact and calcium    Standing Status:   Future    Standing Expiration Date:   01/23/2020       Zoila Shutter MD

## 2019-01-22 NOTE — Telephone Encounter (Signed)
Gave AVS and calendar °

## 2019-01-25 DIAGNOSIS — Z85828 Personal history of other malignant neoplasm of skin: Secondary | ICD-10-CM | POA: Diagnosis not present

## 2019-01-25 DIAGNOSIS — Z08 Encounter for follow-up examination after completed treatment for malignant neoplasm: Secondary | ICD-10-CM | POA: Diagnosis not present

## 2019-02-11 DIAGNOSIS — H5213 Myopia, bilateral: Secondary | ICD-10-CM | POA: Diagnosis not present

## 2019-02-17 DIAGNOSIS — Z961 Presence of intraocular lens: Secondary | ICD-10-CM | POA: Diagnosis not present

## 2019-02-17 DIAGNOSIS — H26493 Other secondary cataract, bilateral: Secondary | ICD-10-CM | POA: Diagnosis not present

## 2019-02-23 ENCOUNTER — Telehealth: Payer: Self-pay | Admitting: Family Medicine

## 2019-02-23 DIAGNOSIS — Z0279 Encounter for issue of other medical certificate: Secondary | ICD-10-CM

## 2019-02-23 NOTE — Telephone Encounter (Signed)
Placed form in Dr. Synthia Innocent box.  Updated pt's pharmacy list.

## 2019-02-23 NOTE — Telephone Encounter (Signed)
Pt dropped Handicap Parking form to be filled out by Dr. Danise Mina. Please call and mail the form to patient when it is ready. Pt states that on the form they have her name as [name] not [name] that we have in the system. Placed form in RX tower in front office for Dr. Darnell Level. She also states she wants to change pharmacy from [facility] to [facility] [place] phone # [phone].

## 2019-02-23 NOTE — Telephone Encounter (Signed)
Form filled and in Lisa's box.  

## 2019-02-24 NOTE — Telephone Encounter (Signed)
I spoke with pt, she is aware of charge and letter is in the mail.

## 2019-02-24 NOTE — Telephone Encounter (Signed)
Noted  

## 2019-02-24 NOTE — Telephone Encounter (Signed)
Attempted to contact pt.  No answer. Vm not set up.  Need to inform pt the completed form has been mailed to her as requested.  Also, there is a $5.00 fee to complete the form and she will be billed.  [Made copies to be scanned and for billing.]

## 2019-03-03 ENCOUNTER — Other Ambulatory Visit: Payer: Self-pay

## 2019-03-03 MED ORDER — PRAVASTATIN SODIUM 40 MG PO TABS: 40.0000 mg | ORAL_TABLET | Freq: Every day | ORAL | 0 refills | Status: DC

## 2019-03-03 MED ORDER — DILTIAZEM HCL ER BEADS 240 MG PO CP24: ORAL_CAPSULE | ORAL | 0 refills | Status: DC

## 2019-03-03 MED ORDER — FLUOXETINE HCL 20 MG PO TABS: 20.0000 mg | ORAL_TABLET | Freq: Every day | ORAL | 0 refills | Status: DC

## 2019-03-03 NOTE — Telephone Encounter (Signed)
Pt request refills for pravastatin 40 mg, diltiazem 240 mg and fluoxetine 20 mg to [facility]. Pt has [facility] scheduled on 04/08/19. Last seen 11/30/18 to discuss meds. Refilled each med x 1 per protocol and pt will get updated refills at [facility]. Pt voiced understanding.

## 2019-03-18 ENCOUNTER — Inpatient Hospital Stay: Payer: Medicare Other | Admitting: Internal Medicine

## 2019-03-18 ENCOUNTER — Inpatient Hospital Stay: Payer: Medicare Other

## 2019-04-05 ENCOUNTER — Ambulatory Visit: Payer: Medicare Other

## 2019-04-08 ENCOUNTER — Encounter: Payer: Medicare Other | Admitting: Family Medicine

## 2019-04-15 ENCOUNTER — Other Ambulatory Visit: Payer: Medicare Other

## 2019-04-15 ENCOUNTER — Ambulatory Visit: Payer: Medicare Other | Admitting: Internal Medicine

## 2019-05-27 ENCOUNTER — Other Ambulatory Visit: Payer: Self-pay | Admitting: Family Medicine

## 2019-05-31 ENCOUNTER — Other Ambulatory Visit: Payer: Self-pay | Admitting: Family Medicine

## 2019-06-02 ENCOUNTER — Other Ambulatory Visit: Payer: Self-pay | Admitting: Family Medicine

## 2019-06-02 NOTE — Telephone Encounter (Signed)
Dr. Danise Mina please advise, last ov for this med was 04/02/2018--suppose to follow up in 1 yr (has CPE scheduled 09/01/2019). Last refill was 03/03/2019 for 90 days supply.

## 2019-07-15 ENCOUNTER — Telehealth: Payer: Self-pay | Admitting: Family Medicine

## 2019-07-15 DIAGNOSIS — N183 Chronic kidney disease, stage 3 unspecified: Secondary | ICD-10-CM

## 2019-07-15 NOTE — Telephone Encounter (Signed)
Pt calling [facility] to schedule labs.  Will call pt to discuss next Prolia injection based on calcium results.

## 2019-07-15 NOTE — Telephone Encounter (Signed)
Called pt.  No answer or VM set up.  No email/MyChart. Pt needs calcium lab before next Prolia injection.  Order on file from oncology for [place].  Will keep trying to reach pt by phone.

## 2019-07-21 NOTE — Addendum Note (Signed)
Addended by: [name] on: 07/21/2019 02:26 PM   Modules accepted: Orders

## 2019-07-21 NOTE — Telephone Encounter (Signed)
Pt called our office stating she only has transportation tomorrow (Thursday). She called the [facility] twice in the last week and they keep telling her they do not have lab appointments available for tomorrow. She will call them again to see if they had any cancellations. She has future orders in the system from Dr Walden Field. If they do not have a lab appt, she is asking if Dr Darnell Level will do the labs in his name and schedule her here tomorrow.

## 2019-07-21 NOTE — Telephone Encounter (Signed)
I have ordered in case she cannot go to cancer center tomorrow. Will need covid screen.

## 2019-07-22 NOTE — Telephone Encounter (Signed)
Pt aware that orders have been placed for lab work if she would like to come. Pt states that she was able to get an appt at the [facility] next Thursday 07/29/19, she already has an appt scheduled with a doctor that day and they just added on lab appt.   Nothing further needed.

## 2019-07-26 DIAGNOSIS — Z08 Encounter for follow-up examination after completed treatment for malignant neoplasm: Secondary | ICD-10-CM | POA: Diagnosis not present

## 2019-07-26 DIAGNOSIS — Z85828 Personal history of other malignant neoplasm of skin: Secondary | ICD-10-CM | POA: Diagnosis not present

## 2019-07-26 DIAGNOSIS — L821 Other seborrheic keratosis: Secondary | ICD-10-CM | POA: Diagnosis not present

## 2019-07-29 ENCOUNTER — Inpatient Hospital Stay (HOSPITAL_BASED_OUTPATIENT_CLINIC_OR_DEPARTMENT_OTHER): Payer: Medicare Other | Admitting: Internal Medicine

## 2019-07-29 ENCOUNTER — Other Ambulatory Visit: Payer: Self-pay

## 2019-07-29 ENCOUNTER — Inpatient Hospital Stay: Payer: Medicare Other | Attending: Internal Medicine

## 2019-07-29 ENCOUNTER — Telehealth: Payer: Self-pay | Admitting: Family Medicine

## 2019-07-29 DIAGNOSIS — Z79899 Other long term (current) drug therapy: Secondary | ICD-10-CM | POA: Diagnosis not present

## 2019-07-29 DIAGNOSIS — Z87891 Personal history of nicotine dependence: Secondary | ICD-10-CM | POA: Diagnosis not present

## 2019-07-29 DIAGNOSIS — N183 Chronic kidney disease, stage 3 (moderate): Secondary | ICD-10-CM | POA: Diagnosis not present

## 2019-07-29 DIAGNOSIS — E785 Hyperlipidemia, unspecified: Secondary | ICD-10-CM

## 2019-07-29 DIAGNOSIS — M129 Arthropathy, unspecified: Secondary | ICD-10-CM | POA: Diagnosis not present

## 2019-07-29 DIAGNOSIS — E039 Hypothyroidism, unspecified: Secondary | ICD-10-CM | POA: Diagnosis not present

## 2019-07-29 DIAGNOSIS — Z801 Family history of malignant neoplasm of trachea, bronchus and lung: Secondary | ICD-10-CM

## 2019-07-29 DIAGNOSIS — M810 Age-related osteoporosis without current pathological fracture: Secondary | ICD-10-CM

## 2019-07-29 DIAGNOSIS — R0602 Shortness of breath: Secondary | ICD-10-CM | POA: Diagnosis not present

## 2019-07-29 DIAGNOSIS — M546 Pain in thoracic spine: Secondary | ICD-10-CM | POA: Diagnosis not present

## 2019-07-29 DIAGNOSIS — Z800 Family history of malignant neoplasm of digestive organs: Secondary | ICD-10-CM | POA: Insufficient documentation

## 2019-07-29 DIAGNOSIS — I129 Hypertensive chronic kidney disease with stage 1 through stage 4 chronic kidney disease, or unspecified chronic kidney disease: Secondary | ICD-10-CM | POA: Insufficient documentation

## 2019-07-29 LAB — CBC WITH DIFFERENTIAL/PLATELET
Abs Immature Granulocytes: 0.01 10*3/uL (ref 0.00–0.07)
Basophils Absolute: 0.1 10*3/uL (ref 0.0–0.1)
Basophils Relative: 1 %
Eosinophils Absolute: 0.2 10*3/uL (ref 0.0–0.5)
Eosinophils Relative: 3 %
HCT: 38.4 % (ref 36.0–46.0)
Hemoglobin: 11.8 g/dL — ABNORMAL LOW (ref 12.0–15.0)
Immature Granulocytes: 0 %
Lymphocytes Relative: 25 %
Lymphs Abs: 1.2 10*3/uL (ref 0.7–4.0)
MCH: 28.9 pg (ref 26.0–34.0)
MCHC: 30.7 g/dL (ref 30.0–36.0)
MCV: 94.1 fL (ref 80.0–100.0)
Monocytes Absolute: 0.5 10*3/uL (ref 0.1–1.0)
Monocytes Relative: 10 %
Neutro Abs: 3 10*3/uL (ref 1.7–7.7)
Neutrophils Relative %: 61 %
Platelets: 203 10*3/uL (ref 150–400)
RBC: 4.08 MIL/uL (ref 3.87–5.11)
RDW: 12.5 % (ref 11.5–15.5)
WBC: 4.9 10*3/uL (ref 4.0–10.5)
nRBC: 0 % (ref 0.0–0.2)

## 2019-07-29 LAB — COMPREHENSIVE METABOLIC PANEL
ALT: 10 U/L (ref 0–44)
AST: 16 U/L (ref 15–41)
Albumin: 3.6 g/dL (ref 3.5–5.0)
Alkaline Phosphatase: 64 U/L (ref 38–126)
Anion gap: 6 (ref 5–15)
BUN: 16 mg/dL (ref 8–23)
CO2: 28 mmol/L (ref 22–32)
Calcium: 10.1 mg/dL (ref 8.9–10.3)
Chloride: 108 mmol/L (ref 98–111)
Creatinine, Ser: 1.44 mg/dL — ABNORMAL HIGH (ref 0.44–1.00)
GFR calc Af Amer: 38 mL/min — ABNORMAL LOW (ref >60)
GFR calc non Af Amer: 33 mL/min — ABNORMAL LOW (ref >60)
Glucose, Bld: 110 mg/dL — ABNORMAL HIGH (ref 70–99)
Potassium: 3.8 mmol/L (ref 3.5–5.1)
Sodium: 142 mmol/L (ref 135–145)
Total Bilirubin: 0.5 mg/dL (ref 0.3–1.2)
Total Protein: 7.3 g/dL (ref 6.5–8.1)

## 2019-07-29 LAB — LACTATE DEHYDROGENASE: LDH: 200 U/L — ABNORMAL HIGH (ref 98–192)

## 2019-07-29 NOTE — Telephone Encounter (Signed)
Patient's daughter [name] called today and stated that her mother really needs a walker with breaks. She currently has a walker but it does not have the breaks on it which they do not think is okay for her.  [name] would like to know what she needs to do to get the patient the walker with breaks.   C/B # [phone]

## 2019-07-29 NOTE — Progress Notes (Signed)
Diagnosis No diagnosis found.  Staging Cancer Staging No matching staging information was found for the patient.  Assessment and Plan;   1.  Hypercalcemia.  85 year old female referred for evaluation due to hypercalcemia and FLC elevation.  Pt reports she had been on calcium supplementation for some time.  She has family history of colon cancer in her sister and parents with lung cancer.  She denies smoking.  Labs done 11/30/2018 reviewed and showed Cr 1.27, Ionized calcium of 5.69.  SPEP negative. FLC ratio minimally elevated at 1.73.     Labs done 01/08/2019 showed WBC 4.7 HB 11.8 plts 213,000.  Chemistries WNL with K+ 4 Cr 1.31 and normal LFTs.  Calcium 10.7.  Ionized Calcium is WNL at 5.7.  Recent ionized calcium level in 11/2018 was 5.6 which was WNL.  She has normal SPEP and quant IG.  FLC ratio of 1.81 which is minimally elevated likely due to RI.    Labs done today 07/29/2019 reviewed and showed WBC 4.9 HB 11.8 plts 203,000.  Chemistries showed K+ 3.8 Cr 1.44 Calcium WNL at 10.  Normal LFTs.  Pt continues to show no significant lab findings.  Pt is assigned to follow-up with PCP and pt can be referred back to cancer center if any change in labs.    2.  RI.  Cr stable at 1.44.   She has normal SPEP and quant IG.  FLC ratio of 1.81 which is minimally elevated likely due to RI.  Follow-up with PCP if any change in renal function who can consider nephrology referral.    3.  Family history of colon cancer and lung cancer.  Sister was treated for CRC. Parents had lung cancer.  Pt denies smoking.  She has undergone colonoscopy evaluation in the past.    4. Osteoporosis.  Pt was treated with Prolia in February 2019.  Follow-up with PCP for ongoing monitoring.    15  minutes spent with more than 50% spent in review of records, counseling and coordination of care.    Interval History: Historical data obtained from note dated 01/08/2019.   85 year old female referred for evaluation due to  hypercalcemia and FLC elevation.  Pt reports she had been on calcium supplementation for some time.  She has family history of colon cancer in her sister and parents with lung cancer.  She denies smoking.  Labs done 11/30/2018 reviewed and showed Cr 1.27, Ionized calcium of 5.69.  SPEP negative.  Pt reports mild SOB with exertion.  Pt seen today for consultation due to RI and hypercalcemia and abnormal FLC ratio.     Current Status:  Pt is seen today for follow-up.  She is here to go over labs.   Oncology History   No history exists.     Problem List Patient Active Problem List   Diagnosis Date Noted  . Hypercalcemia [E83.52] 11/30/2018  . Atypical nevi [D22.9] 11/30/2018  . Family history of lung cancer [Z80.1] 11/30/2018  . Pseudogout of left wrist [M11.232] 04/04/2018  . Left wrist pain [M25.532] 04/02/2018  . Thoracic back pain [M54.6] 09/23/2016  . Osteoporosis [M81.0] 03/30/2016  . Advanced care planning/counseling discussion [Z71.89] 03/13/2015  . Encounter for general adult medical examination with abnormal findings [Z00.01] 03/13/2015  . CKD (chronic kidney disease) stage 3, GFR 30-59 ml/min (HCC) [N18.3] 03/10/2014  . Medicare annual wellness visit, subsequent [Z00.00] 12/17/2012  . CTS (carpal tunnel syndrome) [G56.00] 10/27/2012  . Arthritis [M19.90]   . MDD (major  depressive disorder), recurrent episode, moderate ([place]) [F33.1]   . History of ulcer disease [Z87.898]   . Hypothyroidism [E03.9] 03/11/2012  . Hyperlipidemia [E78.5] 03/11/2012  . Hypertension [I10] 03/11/2012    Past Medical History Past Medical History:  Diagnosis Date  . Arthritis   . Depression   . History of anemia   . History of chicken pox   . History of ulcer disease 1990s  . HLD (hyperlipidemia)   . HTN (hypertension)   . Hypothyroidism   . Osteoporosis 03/2016   at [place] - T -3.1 hip, -1.0 spine  . Uses hearing aid     Past Surgical History Past Surgical History:  Procedure Laterality  Date  . APPENDECTOMY  1974  . BACK SURGERY  1979  . CARDIOVASCULAR STRESS TEST  2010   WNL  . CARPAL TUNNEL RELEASE Left 04/28/2017  . CATARACT EXTRACTION Bilateral 2012  . DEXA  03/2016   at [facility] - T -3.1 hip, -1.0 spine  . GALLBLADDER SURGERY  1974  . [place]   right  . [place]   left  . KNEE SURGERY  2008  . TONSILLECTOMY  1945  . US ECHOCARDIOGRAPHY  2010   LVEF 65-75%, overall normal, mildly decreased LV diastolic compliance    Family History Family History  Problem Relation Age of Onset  . Cancer Mother 46       lung, nonsmoker  . Cancer Father        lung, nonsmoker  . Cancer Brother 88       lung, smoker  . Cancer Sister 12       brain  . Cancer Sister 10       lung, smoker  . COPD Sister        smoker  . Cancer Sister 37       colon  . Hypertension Daughter   . Diabetes Daughter   . CAD Neg Hx   . Stroke Neg Hx      Social History  reports that she quit smoking about 26 years ago. She has never used smokeless tobacco. She reports that she does not drink alcohol or use drugs.  Medications  Current Outpatient Medications:  .  acetaminophen (TYLENOL) 500 MG tablet, Take 1,000 mg by mouth daily as needed. , Disp: , Rfl:  .  cholecalciferol (VITAMIN D) 1000 UNITS tablet, Take 1,000 Units by mouth daily., Disp: , Rfl:  .  diltiazem (TIAZAC) 240 MG 24 hr capsule, TAKE 1 CAPSULE BY MOUTH EVERY DAY, Disp: 90 capsule, Rfl: 0 .  diphenhydramine-acetaminophen (TYLENOL PM) 25-500 MG TABS, Take 2 tablets by mouth at bedtime as needed., Disp: , Rfl:  .  famotidine (PEPCID) 20 MG tablet, Take 20 mg by mouth daily., Disp: , Rfl:  .  FLUoxetine (PROZAC) 20 MG tablet, TAKE 1 TABLET(20 MG) BY MOUTH DAILY, Disp: 90 tablet, Rfl: 1 .  hydrochlorothiazide (HYDRODIURIL) 25 MG tablet, TAKE 1 TABLET BY MOUTH EVERY DAY, Disp: 90 tablet, Rfl: 1 .  levothyroxine (SYNTHROID, LEVOTHROID) 88 MCG tablet, TAKE 1 TABLET BY MOUTH EVERY DAY BEFORE BREAKFAST, Disp: 90  tablet, Rfl: 2 .  lisinopril (PRINIVIL,ZESTRIL) 40 MG tablet, TAKE 1 TABLET BY MOUTH TWICE A DAY, Disp: 180 tablet, Rfl: 2 .  loratadine (CLARITIN) 10 MG tablet, Take 10 mg by mouth daily., Disp: , Rfl:  .  pravastatin (PRAVACHOL) 40 MG tablet, TAKE 1 TABLET(40 MG) BY MOUTH DAILY, Disp: 90 tablet, Rfl: 1 .  ranitidine (  ZANTAC) 150 MG tablet, Take 150 mg by mouth at bedtime. , Disp: , Rfl:  .  vitamin B-12 (CYANOCOBALAMIN) 500 MCG tablet, Take 1 tablet (500 mcg total) by mouth daily., Disp: , Rfl:   Allergies Patient has no known allergies.  Review of Systems Review of Systems - Oncology ROS negative   Physical Exam  Vitals Wt Readings from Last 3 Encounters:  07/29/19 224 lb (101.6 kg)  01/22/19 224 lb 1.6 oz (101.7 kg)  01/08/19 224 lb (101.6 kg)   Temp Readings from Last 3 Encounters:  07/29/19 98.6 F (37 C) (Oral)  01/22/19 98 F (36.7 C) (Oral)  01/08/19 97.7 F (36.5 C) (Oral)   BP Readings from Last 3 Encounters:  07/29/19 (!) 144/72  01/22/19 (!) 163/62  01/08/19 (!) 174/72   Pulse Readings from Last 3 Encounters:  07/29/19 86  01/22/19 68  01/08/19 89   Constitutional: Well-developed, well-nourished, and in no distress.   HENT: Head: Normocephalic and atraumatic.  Mouth/Throat: No oropharyngeal exudate. Mucosa moist. Eyes: Pupils are equal, round, and reactive to light. Conjunctivae are normal. No scleral icterus.  Neck: Normal range of motion. Neck supple. No JVD present.  Cardiovascular: Normal rate, regular rhythm and normal heart sounds.  Exam reveals no gallop and no friction rub.   No murmur heard. Pulmonary/Chest: Effort normal and breath sounds normal. No respiratory distress. No wheezes.No rales.  Abdominal: Soft. Bowel sounds are normal. No distension. There is no tenderness. There is no guarding.  Musculoskeletal: No edema or tenderness. Pt ambulates with walker.   Lymphadenopathy: No cervical, axillary or supraclavicular adenopathy.   Neurological: Alert and oriented to person, place, and time. No cranial nerve deficit.  Skin: Skin is warm and dry. No rash noted. No erythema. No pallor.  Psychiatric: Affect and judgment normal.   Labs Appointment on 07/29/2019  Component Date Value Ref Range Status  . Sodium 07/29/2019 142  135 - 145 mmol/L Final  . Potassium 07/29/2019 3.8  3.5 - 5.1 mmol/L Final  . Chloride 07/29/2019 108  98 - 111 mmol/L Final  . CO2 07/29/2019 28  22 - 32 mmol/L Final  . Glucose, Bld 07/29/2019 110* 70 - 99 mg/dL Final  . BUN 07/29/2019 16  8 - 23 mg/dL Final  . Creatinine, Ser 07/29/2019 1.44* 0.44 - 1.00 mg/dL Final  . Calcium 07/29/2019 10.1  8.9 - 10.3 mg/dL Final  . Total Protein 07/29/2019 7.3  6.5 - 8.1 g/dL Final  . Albumin 07/29/2019 3.6  3.5 - 5.0 g/dL Final  . AST 07/29/2019 16  15 - 41 U/L Final  . ALT 07/29/2019 10  0 - 44 U/L Final  . Alkaline Phosphatase 07/29/2019 64  38 - 126 U/L Final  . Total Bilirubin 07/29/2019 0.5  0.3 - 1.2 mg/dL Final  . GFR calc non Af Amer 07/29/2019 33* >60 mL/min Final  . GFR calc Af Amer 07/29/2019 38* >60 mL/min Final  . Anion gap 07/29/2019 6  5 - 15 Final   Performed at [facility], [place] [address] [place], [place] [address]  . WBC 07/29/2019 4.9  4.0 - 10.5 K/uL Final  . RBC 07/29/2019 4.08  3.87 - 5.11 MIL/uL Final  . Hemoglobin 07/29/2019 11.8* 12.0 - 15.0 g/dL Final  . HCT 07/29/2019 38.4  36.0 - 46.0 % Final  . MCV 07/29/2019 94.1  80.0 - 100.0 fL Final  . MCH 07/29/2019 28.9  26.0 - 34.0 pg Final  . MCHC 07/29/2019 30.7  30.0 - 36.0 g/dL Final  . RDW 07/29/2019 12.5  11.5 - 15.5 % Final  . Platelets 07/29/2019 203  150 - 400 K/uL Final  . nRBC 07/29/2019 0.0  0.0 - 0.2 % Final  . Neutrophils Relative % 07/29/2019 61  % Final  . Neutro Abs 07/29/2019 3.0  1.7 - 7.7 K/uL Final  . Lymphocytes Relative 07/29/2019 25  % Final  . Lymphs Abs 07/29/2019 1.2  0.7 - 4.0 K/uL Final  . Monocytes Relative 07/29/2019 10   % Final  . Monocytes Absolute 07/29/2019 0.5  0.1 - 1.0 K/uL Final  . Eosinophils Relative 07/29/2019 3  % Final  . Eosinophils Absolute 07/29/2019 0.2  0.0 - 0.5 K/uL Final  . Basophils Relative 07/29/2019 1  % Final  . Basophils Absolute 07/29/2019 0.1  0.0 - 0.1 K/uL Final  . Immature Granulocytes 07/29/2019 0  % Final  . Abs Immature Granulocytes 07/29/2019 0.01  0.00 - 0.07 K/uL Final   Performed at [facility], [place] [address] [place], [place] [address]     Pathology No orders of the defined types were placed in this encounter.      Zoila Shutter MD

## 2019-07-30 ENCOUNTER — Telehealth: Payer: Self-pay | Admitting: *Deleted

## 2019-07-30 LAB — PTH, INTACT AND CALCIUM
Calcium, Total (PTH): 10 mg/dL (ref 8.7–10.3)
PTH: 24 pg/mL (ref 15–65)

## 2019-07-30 LAB — CALCIUM, IONIZED: Calcium, Ionized, Serum: 5.3 mg/dL (ref 4.5–5.6)

## 2019-07-30 NOTE — Telephone Encounter (Signed)
Spoke with pt's daughter, [name] (on dpr), asking for details about walker request.  Says pt has an old walker where the brakes have been repaired once but cannot be fixed anymore.  Says pt is able to stop with current walker and make turns.  They are just concerned about the brakes not working.  Reports pt does not have a shuffling gait.  She is able to use cane at home.  Needs walker when going out.  Fyi to Dr. Darnell Level   Notified [name] written rx is ready.  Says she will pick up on 08/02/19.  [Placed rx at front office.]

## 2019-07-30 NOTE — Telephone Encounter (Signed)
Rx written and in Lisa's box to take to local [facility].  plz get what specific concerns daughter has - is pt having trouble stopping on her own? Shuffling gait? Trouble navigating turns?

## 2019-07-30 NOTE — Telephone Encounter (Signed)
TCT patient's daughter regarding pt's lab results from 07/29/19. No answer but was bale to leave vm message stating her labs where normal other than mildly elevated Ca++ and pt's baseline  Renal insuffieciency,  Pt to f/u with her PCP and no need to return to this clinic.  Daughter can call back [phone] with any questions.

## 2019-08-17 ENCOUNTER — Telehealth: Payer: Self-pay | Admitting: Family Medicine

## 2019-08-17 NOTE — Telephone Encounter (Signed)
Spoke with pt asking about SOB when walking and some dizziness.  Denies any chest pain, fever or cough.  Sxs started a couple of weeks ago. Pt scheduled for in office visit tomorrow at 3:00.

## 2019-08-17 NOTE — Telephone Encounter (Signed)
Discussed Prolia benefits w/pt.  Pt would owe approximately $230.  During Covid screening questions before scheduling pt for next injection, pt stated she get SOB only with walking that started about 1 week ago.  Will wait to schedule Prolia. Per Leafy Ro, please triage.

## 2019-08-17 NOTE — Telephone Encounter (Signed)
Prolia benefits submitted. °

## 2019-08-18 ENCOUNTER — Ambulatory Visit (INDEPENDENT_AMBULATORY_CARE_PROVIDER_SITE_OTHER)
Admission: RE | Admit: 2019-08-18 | Discharge: 2019-08-18 | Disposition: A | Discharge status: Home / Self Care | Payer: Medicare Other | Source: Ambulatory Visit | Attending: Family Medicine | Admitting: Family Medicine

## 2019-08-18 ENCOUNTER — Other Ambulatory Visit: Payer: Self-pay

## 2019-08-18 ENCOUNTER — Encounter: Payer: Self-pay | Admitting: Family Medicine

## 2019-08-18 ENCOUNTER — Ambulatory Visit (INDEPENDENT_AMBULATORY_CARE_PROVIDER_SITE_OTHER): Payer: Medicare Other | Admitting: Family Medicine

## 2019-08-18 VITALS — BP 122/62 | HR 76 | Temp 98.7°F | Ht 64.0 in | Wt 220.4 lb

## 2019-08-18 DIAGNOSIS — R0609 Other forms of dyspnea: Secondary | ICD-10-CM

## 2019-08-18 DIAGNOSIS — R0602 Shortness of breath: Secondary | ICD-10-CM

## 2019-08-18 DIAGNOSIS — R0600 Dyspnea, unspecified: Secondary | ICD-10-CM

## 2019-08-18 NOTE — Patient Instructions (Addendum)
EKG today Labs next week.  Walking oxygen test today.  Xray today. This may be from deconditioning. Slowly increase activity as tolerated, let us know if worsening shortness of breath or new symptoms.

## 2019-08-18 NOTE — Progress Notes (Signed)
This visit was conducted in person.  BP 122/62 (BP Location: Left Arm, Patient Position: Sitting, Cuff Size: Large)   Pulse 76   Temp 98.7 F (37.1 C) (Temporal)   Ht 5\' 4"  (1.626 m)   Wt 220 lb 7 oz (100 kg)   SpO2 98% Comment: Ambulatory:  started at 97%, dropped to 96%, finished at 97%  BMI 37.84 kg/m   Orthostatic VS for the past 24 hrs (Last 3 readings):  BP- Lying BP- Standing at 0 minutes  08/18/19 1457 - 118/62  08/18/19 1455 122/70 -   CC: dizziness Subjective:    Patient ID: [name], female    DOB: [birth year], 85 y.o.   MRN: [id number]  HPI: [name] is a 85 y.o. female presenting on 08/18/2019 for Shortness of Breath (C/o SOB after moving. Started about 2 wks.  Denies any chest pain.  Pt accompanied by daughter, [name]. ) and Dizziness (C/o occsional dizziness. )   2 wk h/o progressive exertional dyspnea associated with occasional dizziness described as lightheadedness. Occasional headache.   No chest pain, cough, wheezing, fever, abd pain, palpitations.  Daughter bought her new walker.   Ex smoker quit 1994 - <1/2 ppd when she did smoke. No known h/o COPD.      Relevant past medical, surgical, family and social history reviewed and updated as indicated. Interim medical history since our last visit reviewed. Allergies and medications reviewed and updated. Outpatient Medications Prior to Visit  Medication Sig Dispense Refill  . acetaminophen (TYLENOL) 500 MG tablet Take 1,000 mg by mouth daily as needed.     . cholecalciferol (VITAMIN D) 1000 UNITS tablet Take 1,000 Units by mouth daily.    Marland Kitchen diltiazem (TIAZAC) 240 MG 24 hr capsule TAKE 1 CAPSULE BY MOUTH EVERY DAY 90 capsule 0  . diphenhydramine-acetaminophen (TYLENOL PM) 25-500 MG TABS Take 2 tablets by mouth at bedtime as needed.    . famotidine (PEPCID) 20 MG tablet Take 20 mg by mouth daily.    Marland Kitchen FLUoxetine (PROZAC) 20 MG tablet TAKE 1 TABLET(20 MG) BY MOUTH DAILY 90 tablet 1  . hydrochlorothiazide  (HYDRODIURIL) 25 MG tablet TAKE 1 TABLET BY MOUTH EVERY DAY 90 tablet 1  . levothyroxine (SYNTHROID, LEVOTHROID) 88 MCG tablet TAKE 1 TABLET BY MOUTH EVERY DAY BEFORE BREAKFAST 90 tablet 2  . lisinopril (PRINIVIL,ZESTRIL) 40 MG tablet TAKE 1 TABLET BY MOUTH TWICE A DAY 180 tablet 2  . loratadine (CLARITIN) 10 MG tablet Take 10 mg by mouth daily.    . pravastatin (PRAVACHOL) 40 MG tablet TAKE 1 TABLET(40 MG) BY MOUTH DAILY 90 tablet 1  . ranitidine (ZANTAC) 150 MG tablet Take 150 mg by mouth at bedtime.     . vitamin B-12 (CYANOCOBALAMIN) 500 MCG tablet Take 1 tablet (500 mcg total) by mouth daily.     No facility-administered medications prior to visit.      Per HPI unless specifically indicated in ROS section below Review of Systems Objective:    BP 122/62 (BP Location: Left Arm, Patient Position: Sitting, Cuff Size: Large)   Pulse 76   Temp 98.7 F (37.1 C) (Temporal)   Ht 5\' 4"  (1.626 m)   Wt 220 lb 7 oz (100 kg)   SpO2 98% Comment: Ambulatory:  started at 97%, dropped to 96%, finished at 97%  BMI 37.84 kg/m   Wt Readings from Last 3 Encounters:  08/18/19 220 lb 7 oz (100 kg)  07/29/19 224 lb (101.6 kg)  01/22/19  224 lb 1.6 oz (101.7 kg)    Physical Exam Vitals signs and nursing note reviewed.  Constitutional:      General: She is not in acute distress.    Appearance: Normal appearance. She is well-developed. She is obese. She is not ill-appearing.  HENT:     Mouth/Throat:     Mouth: Mucous membranes are moist.     Pharynx: No posterior oropharyngeal erythema.  Cardiovascular:     Rate and Rhythm: Normal rate. Rhythm irregular.     Pulses: Normal pulses.     Heart sounds: Normal heart sounds. No murmur (not appreciated today).  Pulmonary:     Effort: Pulmonary effort is normal. No respiratory distress.     Breath sounds: Normal breath sounds. No wheezing, rhonchi or rales.  Musculoskeletal:     Right lower leg: Edema (tr) present.     Left lower leg: Edema (tr)  present.  Neurological:     Mental Status: She is alert.  Psychiatric:        Mood and Affect: Mood normal.        Behavior: Behavior normal.       Results for orders placed or performed in visit on 07/29/19  PTH, intact and calcium  Result Value Ref Range   PTH 24 15 - 65 pg/mL   Calcium, Total (PTH) 10.0 8.7 - 10.3 mg/dL   PTH Interp Comment   Calcium, ionized  Result Value Ref Range   Calcium, Ionized, Serum 5.3 4.5 - 5.6 mg/dL  Lactate dehydrogenase  Result Value Ref Range   LDH 200 (H) 98 - 192 U/L  Comprehensive metabolic panel  Result Value Ref Range   Sodium 142 135 - 145 mmol/L   Potassium 3.8 3.5 - 5.1 mmol/L   Chloride 108 98 - 111 mmol/L   CO2 28 22 - 32 mmol/L   Glucose, Bld 110 (H) 70 - 99 mg/dL   BUN 16 8 - 23 mg/dL   Creatinine, Ser 1.44 (H) 0.44 - 1.00 mg/dL   Calcium 10.1 8.9 - 10.3 mg/dL   Total Protein 7.3 6.5 - 8.1 g/dL   Albumin 3.6 3.5 - 5.0 g/dL   AST 16 15 - 41 U/L   ALT 10 0 - 44 U/L   Alkaline Phosphatase 64 38 - 126 U/L   Total Bilirubin 0.5 0.3 - 1.2 mg/dL   GFR calc non Af Amer 33 (L) >60 mL/min   GFR calc Af Amer 38 (L) >60 mL/min   Anion gap 6 5 - 15  CBC with Differential/Platelet  Result Value Ref Range   WBC 4.9 4.0 - 10.5 K/uL   RBC 4.08 3.87 - 5.11 MIL/uL   Hemoglobin 11.8 (L) 12.0 - 15.0 g/dL   HCT 38.4 36.0 - 46.0 %   MCV 94.1 80.0 - 100.0 fL   MCH 28.9 26.0 - 34.0 pg   MCHC 30.7 30.0 - 36.0 g/dL   RDW 12.5 11.5 - 15.5 %   Platelets 203 150 - 400 K/uL   nRBC 0.0 0.0 - 0.2 %   Neutrophils Relative % 61 %   Neutro Abs 3.0 1.7 - 7.7 K/uL   Lymphocytes Relative 25 %   Lymphs Abs 1.2 0.7 - 4.0 K/uL   Monocytes Relative 10 %   Monocytes Absolute 0.5 0.1 - 1.0 K/uL   Eosinophils Relative 3 %   Eosinophils Absolute 0.2 0.0 - 0.5 K/uL   Basophils Relative 1 %   Basophils Absolute 0.1 0.0 - 0.1 K/uL  Immature Granulocytes 0 %   Abs Immature Granulocytes 0.01 0.00 - 0.07 K/uL   EKG - NSR rate 70, LAD with LAFB, normal  intervals, poor R wave progression, no acute ST/T changes, frequent PVCs Assessment & Plan:   Problem List Items Addressed This Visit    Exertional dyspnea - Primary    This in setting of increased sedentary lifestyle due to pandemic/isolation at her ALF. Overall benign exam, maintains O2 saturations during ambulatory pulse ox.  Check CXR and EKG today.  Update labs when returns next week for fasting labs (not fasting today).  Pt/daughter agree with plan.       Relevant Orders   EKG 12-Lead (Completed)   DG Chest 2 View (Completed)       No orders of the defined types were placed in this encounter.  Orders Placed This Encounter  Procedures  . DG Chest 2 View    Standing Status:   Future    Number of Occurrences:   1    Standing Expiration Date:   10/17/2020    Order Specific Question:   Reason for Exam (SYMPTOM  OR DIAGNOSIS REQUIRED)    Answer:   exertional dyspnea    Order Specific Question:   Preferred imaging location?    Answer:   [facility]    Order Specific Question:   Radiology Contrast Protocol - do NOT remove file path    Answer:   [email or web address]  . EKG 12-Lead    Patient Instructions  EKG today Labs next week.  Walking oxygen test today.  Xray today. This may be from deconditioning. Slowly increase activity as tolerated, let us know if worsening shortness of breath or new symptoms.    Follow up plan: Return if symptoms worsen or fail to improve.  [name], MD

## 2019-08-18 NOTE — Assessment & Plan Note (Addendum)
This in setting of increased sedentary lifestyle due to pandemic/isolation at her ALF. Overall benign exam, maintains O2 saturations during ambulatory pulse ox.  Check CXR and EKG today.  Update labs when returns next week for fasting labs (not fasting today).  Pt/daughter agree with plan.

## 2019-08-23 ENCOUNTER — Other Ambulatory Visit (INDEPENDENT_AMBULATORY_CARE_PROVIDER_SITE_OTHER): Payer: Medicare Other

## 2019-08-23 DIAGNOSIS — N183 Chronic kidney disease, stage 3 unspecified: Secondary | ICD-10-CM

## 2019-08-23 LAB — CBC WITH DIFFERENTIAL/PLATELET
Basophils Absolute: 0.1 10*3/uL (ref 0.0–0.1)
Basophils Relative: 1.7 % (ref 0.0–3.0)
Eosinophils Absolute: 0.1 10*3/uL (ref 0.0–0.7)
Eosinophils Relative: 2.8 % (ref 0.0–5.0)
HCT: 36.2 % (ref 36.0–46.0)
Hemoglobin: 11.8 g/dL — ABNORMAL LOW (ref 12.0–15.0)
Lymphocytes Relative: 33.8 % (ref 12.0–46.0)
Lymphs Abs: 1.5 10*3/uL (ref 0.7–4.0)
MCHC: 32.7 g/dL (ref 30.0–36.0)
MCV: 89.6 fl (ref 78.0–100.0)
Monocytes Absolute: 0.4 10*3/uL (ref 0.1–1.0)
Monocytes Relative: 9.3 % (ref 3.0–12.0)
Neutro Abs: 2.3 10*3/uL (ref 1.4–7.7)
Neutrophils Relative %: 52.4 % (ref 43.0–77.0)
Platelets: 230 10*3/uL (ref 150.0–400.0)
RBC: 4.04 Mil/uL (ref 3.87–5.11)
RDW: 13.2 % (ref 11.5–15.5)
WBC: 4.5 10*3/uL (ref 4.0–10.5)

## 2019-08-23 LAB — COMPREHENSIVE METABOLIC PANEL
ALT: 10 U/L (ref 0–35)
AST: 15 U/L (ref 0–37)
Albumin: 4.2 g/dL (ref 3.5–5.2)
Alkaline Phosphatase: 61 U/L (ref 39–117)
BUN: 19 mg/dL (ref 6–23)
CO2: 28 mEq/L (ref 19–32)
Calcium: 10.4 mg/dL (ref 8.4–10.5)
Chloride: 105 mEq/L (ref 96–112)
Creatinine, Ser: 1.45 mg/dL — ABNORMAL HIGH (ref 0.40–1.20)
GFR: 34.14 mL/min — ABNORMAL LOW (ref >60.00)
Glucose, Bld: 89 mg/dL (ref 70–99)
Potassium: 4 mEq/L (ref 3.5–5.1)
Sodium: 140 mEq/L (ref 135–145)
Total Bilirubin: 0.5 mg/dL (ref 0.2–1.2)
Total Protein: 7.3 g/dL (ref 6.0–8.3)

## 2019-08-24 ENCOUNTER — Other Ambulatory Visit: Payer: Self-pay | Admitting: Family Medicine

## 2019-08-24 LAB — PTH, INTACT AND CALCIUM
Calcium: 10.4 mg/dL (ref 8.6–10.4)
PTH: 43 pg/mL (ref 14–64)

## 2019-08-24 LAB — LACTATE DEHYDROGENASE: LDH: 166 U/L (ref 120–250)

## 2019-08-24 LAB — CALCIUM, IONIZED: Calcium, Ion: 5.84 mg/dL — ABNORMAL HIGH (ref 4.8–5.6)

## 2019-08-27 ENCOUNTER — Ambulatory Visit: Payer: Medicare Other

## 2019-09-01 ENCOUNTER — Other Ambulatory Visit: Payer: Self-pay

## 2019-09-01 ENCOUNTER — Ambulatory Visit (INDEPENDENT_AMBULATORY_CARE_PROVIDER_SITE_OTHER): Payer: Medicare Other | Admitting: Family Medicine

## 2019-09-01 ENCOUNTER — Encounter: Payer: Self-pay | Admitting: Family Medicine

## 2019-09-01 VITALS — BP 118/62 | HR 76 | Temp 98.7°F | Ht 63.5 in | Wt 223.2 lb

## 2019-09-01 DIAGNOSIS — Z0000 Encounter for general adult medical examination without abnormal findings: Secondary | ICD-10-CM | POA: Diagnosis not present

## 2019-09-01 DIAGNOSIS — E785 Hyperlipidemia, unspecified: Secondary | ICD-10-CM | POA: Diagnosis not present

## 2019-09-01 DIAGNOSIS — F331 Major depressive disorder, recurrent, moderate: Secondary | ICD-10-CM

## 2019-09-01 DIAGNOSIS — Z7189 Other specified counseling: Secondary | ICD-10-CM

## 2019-09-01 DIAGNOSIS — R011 Cardiac murmur, unspecified: Secondary | ICD-10-CM | POA: Insufficient documentation

## 2019-09-01 DIAGNOSIS — R0600 Dyspnea, unspecified: Secondary | ICD-10-CM

## 2019-09-01 DIAGNOSIS — I10 Essential (primary) hypertension: Secondary | ICD-10-CM

## 2019-09-01 DIAGNOSIS — Z23 Encounter for immunization: Secondary | ICD-10-CM | POA: Diagnosis not present

## 2019-09-01 DIAGNOSIS — N183 Chronic kidney disease, stage 3 unspecified: Secondary | ICD-10-CM

## 2019-09-01 DIAGNOSIS — M810 Age-related osteoporosis without current pathological fracture: Secondary | ICD-10-CM | POA: Diagnosis not present

## 2019-09-01 DIAGNOSIS — Z0001 Encounter for general adult medical examination with abnormal findings: Secondary | ICD-10-CM

## 2019-09-01 DIAGNOSIS — R0609 Other forms of dyspnea: Secondary | ICD-10-CM | POA: Diagnosis not present

## 2019-09-01 DIAGNOSIS — E039 Hypothyroidism, unspecified: Secondary | ICD-10-CM | POA: Diagnosis not present

## 2019-09-01 DIAGNOSIS — N3941 Urge incontinence: Secondary | ICD-10-CM | POA: Insufficient documentation

## 2019-09-01 LAB — POC URINALSYSI DIPSTICK (AUTOMATED)
Bilirubin, UA: NEGATIVE
Blood, UA: NEGATIVE
Glucose, UA: NEGATIVE
Ketones, UA: NEGATIVE
Nitrite, UA: NEGATIVE
Protein, UA: NEGATIVE
Spec Grav, UA: 1.015 (ref 1.010–1.025)
Urobilinogen, UA: 0.2 E.U./dL
pH, UA: 6 (ref 5.0–8.0)

## 2019-09-01 LAB — LIPID PANEL
Cholesterol: 157 mg/dL (ref 0–200)
HDL: 54.2 mg/dL (ref >39.00)
LDL Cholesterol: 75 mg/dL (ref 0–99)
NonHDL: 103.15
Total CHOL/HDL Ratio: 3
Triglycerides: 143 mg/dL (ref 0.0–149.0)
VLDL: 28.6 mg/dL (ref 0.0–40.0)

## 2019-09-01 LAB — BRAIN NATRIURETIC PEPTIDE: Pro B Natriuretic peptide (BNP): 95 pg/mL (ref 0.0–100.0)

## 2019-09-01 LAB — TSH: TSH: 1.08 u[IU]/mL (ref 0.35–4.50)

## 2019-09-01 LAB — VITAMIN D 25 HYDROXY (VIT D DEFICIENCY, FRACTURES): VITD: 46 ng/mL (ref 30.00–100.00)

## 2019-09-01 MED ORDER — MIRABEGRON ER 25 MG PO TB24: 25.0000 mg | ORAL_TABLET | Freq: Every day | ORAL | 3 refills | Status: DC

## 2019-09-01 NOTE — Assessment & Plan Note (Signed)
New concern noted today - pt endorses longstanding history of trouble with urge incontinence. No stress incontinence symptoms. Check UA today.  Briefly discussed medical treatment options including myrbetriq and antimuscarinics. Will price out myrbetriq.

## 2019-09-01 NOTE — Assessment & Plan Note (Signed)
Ca levels have normalized. Check vit D. Will restart prolia injections. Consider updated DEXA at next visit.

## 2019-09-01 NOTE — Assessment & Plan Note (Addendum)
Advanced directives: has not set up. Would want daughter [name] to be HCPOA. Packet previously provided, again today. Doesn't want prolonged life support.

## 2019-09-01 NOTE — Assessment & Plan Note (Signed)
Chronic, stable. Update TSH today.

## 2019-09-01 NOTE — Assessment & Plan Note (Signed)
Chronic, stable GFR to 30s over the last few years. Again reviewed with patient. If progressing, will refer to nephrology.

## 2019-09-01 NOTE — Progress Notes (Signed)
This visit was conducted in person.  BP 118/62 (BP Location: Left Arm, Patient Position: Sitting, Cuff Size: Large)   Pulse 76   Temp 98.7 F (37.1 C) (Temporal)   Ht 5' 3.5" (1.613 m)   Wt 223 lb 4 oz (101.3 kg)   SpO2 97%   BMI 38.93 kg/m    CC: AMW Subjective:    Patient ID: [name], female    DOB: 09-04-1930, 85 y.o.   MRN: [id number]  HPI: [name] is a 85 y.o. female presenting on 09/01/2019 for Medicare Wellness (Pt accompanied by granddaughter, Lenna Sciara. )   Did not see health advisor this year.  See prior note for details. Exertional dyspnea largely stable.  Hypercalcemia - improved off calcium supplements (inadevertently using tums). She did see hematology s/p reassuring evaluation.  Some trouble falling asleep at night time.    Hearing Screening   125Hz  250Hz  500Hz  1000Hz  2000Hz  3000Hz  4000Hz  6000Hz  8000Hz   Right ear:           Left ear:           Comments: Wears bilateral hearing aids  Vision Screening Comments: Last eye exam, 06/2019    Office Visit from 09/01/2019 in [place] at [facility]  PHQ-2 Total Score  0      Fall Risk  09/01/2019 03/31/2018 03/19/2017 03/07/2016 03/13/2015  Falls in the past year? 0 Yes No No No  Comment - fell on birthday in April [birth year], 2017 after stepping up on curb; bruising only with no medical treatment - - -  Number falls in past yr: 0 1 - - -  Injury with Fall? 1 Yes - - -  Risk for fall due to : - Impaired balance/gait;Impaired mobility - Impaired balance/gait -    Preventative: Colonoscopy - had normal colonoscopy per pt years ago. Normal iFOB 03-Apr-2012. Has aged out. Mammogram - 10/2012, normal. Agrees to repeat. Does do breast exams at home.  Well woman - aged out. Declines pelvic exam. DEXA 03/2016 at [facility] - T -3.1 hip, -1.0 spine. Received prolia 01/2018 then on hold due to hypercalcemia.  Flu - yearly Pneumovax - 09/2012. Prevnar - 04/03/2014 Tetanus - 04/03/2009  zostavax - 12/2011  shingrix - discussed, declines Advanced  directives: has not set up. Would want daughter [name] to be HCPOA. Packet previously provided. Doesn't want prolonged life support. Seat belt use discussed  Sunscreen use discussed. No changing moles on skin. Non smoker Alcohol - none  Dentist - does not see  Eye exam - yearly Bowels - no constipation Bladder - some urge incontinence especially with caffeine, she wears pads  Widower since Apr 04, 1999 - husband battled cancer for 6 yrs, died from MI Daughter Carlean Crowl) passed away 04/04/15. Other daughter [name] nearby.  Lives in retirement home - [facility] on [address].  Occupation: retired  HS: 12th grade  Activity: no regular exercise  Diet: good water, fruits/vegetables daily      Relevant past medical, surgical, family and social history reviewed and updated as indicated. Interim medical history since our last visit reviewed. Allergies and medications reviewed and updated. Outpatient Medications Prior to Visit  Medication Sig Dispense Refill  . acetaminophen (TYLENOL) 500 MG tablet Take 1,000 mg by mouth daily as needed.     . cholecalciferol (VITAMIN D) 1000 UNITS tablet Take 1,000 Units by mouth daily.    Marland Kitchen diltiazem (TIAZAC) 240 MG 24 hr capsule TAKE 1 CAPSULE BY MOUTH EVERY DAY 90 capsule 0  .  diphenhydramine-acetaminophen (TYLENOL PM) 25-500 MG TABS Take 2 tablets by mouth at bedtime as needed.    . famotidine (PEPCID) 20 MG tablet Take 20 mg by mouth daily.    Marland Kitchen FLUoxetine (PROZAC) 20 MG tablet TAKE 1 TABLET(20 MG) BY MOUTH DAILY 90 tablet 1  . hydrochlorothiazide (HYDRODIURIL) 25 MG tablet TAKE 1 TABLET BY MOUTH EVERY DAY 90 tablet 2  . levothyroxine (SYNTHROID) 88 MCG tablet TAKE 1 TABLET BY MOUTH EVERY DAY BEFORE BREAKFAST 90 tablet 2  . lisinopril (ZESTRIL) 40 MG tablet TAKE 1 TABLET BY MOUTH TWICE DAILY 180 tablet 2  . loratadine (CLARITIN) 10 MG tablet Take 10 mg by mouth daily.    . pravastatin (PRAVACHOL) 40 MG tablet TAKE 1 TABLET(40 MG) BY MOUTH DAILY 90 tablet 1  .  ranitidine (ZANTAC) 150 MG tablet Take 150 mg by mouth at bedtime.     . vitamin B-12 (CYANOCOBALAMIN) 500 MCG tablet Take 1 tablet (500 mcg total) by mouth daily.     No facility-administered medications prior to visit.      Per HPI unless specifically indicated in ROS section below Review of Systems  Constitutional: Negative for activity change, appetite change, chills, fatigue, fever and unexpected weight change.  HENT: Negative for hearing loss.   Eyes: Negative for visual disturbance.  Respiratory: Positive for shortness of breath. Negative for cough, chest tightness and wheezing.   Cardiovascular: Positive for leg swelling. Negative for chest pain and palpitations.  Gastrointestinal: Negative for abdominal distention, abdominal pain, blood in stool, constipation, diarrhea, nausea and vomiting.  Genitourinary: Negative for difficulty urinating and hematuria.  Musculoskeletal: Negative for arthralgias, myalgias and neck pain.  Skin: Negative for rash.  Neurological: Positive for headaches. Negative for dizziness, seizures and syncope.  Hematological: Negative for adenopathy. Bruises/bleeds easily.  Psychiatric/Behavioral: Positive for dysphoric mood. The patient is not nervous/anxious.    Objective:    BP 118/62 (BP Location: Left Arm, Patient Position: Sitting, Cuff Size: Large)   Pulse 76   Temp 98.7 F (37.1 C) (Temporal)   Ht 5' 3.5" (1.613 m)   Wt 223 lb 4 oz (101.3 kg)   SpO2 97%   BMI 38.93 kg/m   Wt Readings from Last 3 Encounters:  09/01/19 223 lb 4 oz (101.3 kg)  08/18/19 220 lb 7 oz (100 kg)  07/29/19 224 lb (101.6 kg)    Physical Exam Vitals signs and nursing note reviewed.  Constitutional:      General: She is not in acute distress.    Appearance: Normal appearance. She is well-developed. She is obese. She is not ill-appearing.     Comments: Walks with walker  HENT:     Head: Normocephalic and atraumatic.     Right Ear: Hearing, tympanic membrane, ear  canal and external ear normal.     Left Ear: Hearing, tympanic membrane, ear canal and external ear normal.     Nose: Nose normal.     Mouth/Throat:     Mouth: Mucous membranes are moist.     Pharynx: Uvula midline. No oropharyngeal exudate or posterior oropharyngeal erythema.  Eyes:     General: No scleral icterus.    Extraocular Movements: Extraocular movements intact.     Conjunctiva/sclera: Conjunctivae normal.     Pupils: Pupils are equal, round, and reactive to light.  Neck:     Musculoskeletal: Normal range of motion and neck supple.     Vascular: No carotid bruit, hepatojugular reflux or JVD.  Cardiovascular:     Rate  and Rhythm: Normal rate and regular rhythm.     Pulses: Normal pulses.          Radial pulses are 2+ on the right side and 2+ on the left side.     Heart sounds: Murmur (3/6 systolic murmur) present.  Pulmonary:     Effort: Pulmonary effort is normal. No respiratory distress.     Breath sounds: Normal breath sounds. No wheezing, rhonchi or rales.  Abdominal:     General: Abdomen is flat. Bowel sounds are normal. There is no distension.     Palpations: Abdomen is soft. There is no mass.     Tenderness: There is no abdominal tenderness. There is no guarding or rebound.     Hernia: No hernia is present.  Musculoskeletal: Normal range of motion.     Right lower leg: Edema (tr) present.     Left lower leg: Edema (tr) present.     Comments: Compression stockings in place  Lymphadenopathy:     Cervical: No cervical adenopathy.  Skin:    General: Skin is warm and dry.     Findings: No rash.  Neurological:     General: No focal deficit present.     Mental Status: She is alert and oriented to person, place, and time.     Comments:  CN grossly intact, station and gait intact Recall 3/3  Calculation 5/5 [id number]  Psychiatric:        Mood and Affect: Mood normal.        Behavior: Behavior normal.        Thought Content: Thought content normal.        Judgment:  Judgment normal.       Results for orders placed or performed in visit on 09/01/19  POCT Urinalysis Dipstick (Automated)  Result Value Ref Range   Color, UA yellow    Clarity, UA cloudy    Glucose, UA Negative Negative   Bilirubin, UA negative    Ketones, UA negative    Spec Grav, UA 1.015 1.010 - 1.025   Blood, UA negative    pH, UA 6.0 5.0 - 8.0   Protein, UA Negative Negative   Urobilinogen, UA 0.2 0.2 or 1.0 E.U./dL   Nitrite, UA negative    Leukocytes, UA Small (1+) (A) Negative   Depression screen [facility] 2/9 09/01/2019 03/31/2018 03/19/2017 03/07/2016 03/13/2015  Decreased Interest 0 0 0 0 0  Down, Depressed, Hopeless 0 0 0 0 0  PHQ - 2 Score 0 0 0 0 0  Altered sleeping - 0 - - -  Tired, decreased energy - 0 - - -  Change in appetite - 0 - - -  Feeling bad or failure about yourself  - 0 - - -  Trouble concentrating - 0 - - -  Moving slowly or fidgety/restless - 0 - - -  Suicidal thoughts - 0 - - -  PHQ-9 Score - 0 - - -  Difficult doing work/chores - Not difficult at all - - -    Assessment & Plan:   Problem List Items Addressed This Visit    Urge incontinence    New concern noted today - pt endorses longstanding history of trouble with urge incontinence. No stress incontinence symptoms. Check UA today.  Briefly discussed medical treatment options including myrbetriq and antimuscarinics. Will price out myrbetriq.       Relevant Medications   mirabegron ER (MYRBETRIQ) 25 MG TB24 tablet   Other Relevant Orders   POCT  Urinalysis Dipstick (Automated) (Completed)   Systolic murmur    Heard today. Consider updated echo in setting of progressive dyspnea.       Osteoporosis    Ca levels have normalized. Check vit D. Will restart prolia injections. Consider updated DEXA at next visit.       Relevant Orders   VITAMIN D 25 Hydroxy (Vit-D Deficiency, Fractures)   Medicare annual wellness visit, subsequent - Primary    I have personally reviewed the Medicare Annual Wellness  questionnaire and have noted 1. The patient's medical and social history 2. Their use of alcohol, tobacco or illicit drugs 3. Their current medications and supplements 4. The patient's functional ability including ADL's, fall risks, home safety risks and hearing or visual impairment. Cognitive function has been assessed and addressed as indicated.  5. Diet and physical activity 6. Evidence for depression or mood disorders The patients weight, height, BMI have been recorded in the chart. I have made referrals, counseling and provided education to the patient based on review of the above and I have provided the pt with a written personalized care plan for preventive services. Provider list updated.. See scanned questionairre as needed for further documentation. Reviewed preventative protocols and updated unless pt declined.       MDD (major depressive disorder), recurrent episode, moderate ([place])    Struggling with increase in social isolation due to Covid pandemic. Support provided. Continue prozac.       Hypothyroidism    Chronic, stable. Update TSH today.       Relevant Orders   TSH   Hypertension    Chronic, stable on lisinopril and diltiazem.       Hyperlipidemia    Chronic, continue pravastatin. Update FLP today. The ASCVD Risk score Mikey Bussing DC Jr., et al., 2013) failed to calculate for the following reasons:   The 2013 ASCVD risk score is only valid for ages 64 to 101       Relevant Orders   Lipid panel   Hypercalcemia    Levels have normalized off tums/Ca supplements.  Will restart prolia injections.       Exertional dyspnea    Anticipate related to deconditioning in setting of covid pandemic. 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 Check BNP today, consider updated echocardiogram in setting of worsening dyspnea.      Relevant Orders   Brain natriuretic peptide   Encounter for general adult medical examination  with abnormal findings    Preventative protocols reviewed and updated unless pt declined. Discussed healthy diet and lifestyle.       CKD (chronic kidney disease) stage 3, GFR 30-59 ml/min (HCC)    Chronic, stable GFR to 30s over the last few years. Again reviewed with patient. If progressing, will refer to nephrology.      Advanced care planning/counseling discussion    Advanced directives: has not set up. Would want daughter [name] to be HCPOA. Packet previously provided, again today. Doesn't want prolonged life support.       Other Visit Diagnoses    Need for influenza vaccination       Relevant Orders   Flu Vaccine QUAD High Dose(Fluad) (Completed)       Meds ordered this encounter  Medications  . mirabegron ER (MYRBETRIQ) 25 MG TB24 tablet    Sig: Take 1 tablet (25 mg total) by mouth daily.    Dispense:  30 tablet  Refill:  3   Orders Placed This Encounter  Procedures  . Flu Vaccine QUAD High Dose(Fluad)  . Lipid panel  . TSH  . VITAMIN D 25 Hydroxy (Vit-D Deficiency, Fractures)  . Brain natriuretic peptide  . POCT Urinalysis Dipstick (Automated)    Patient instructions: Flu shot today  Labs today.  Urinalysis today. Price out [employer] for urinary urge incontinence symptoms.  We will set you up for repeat prolia injection.  If interested, check with pharmacy about new 2 shot shingles series (shingrix).  Work on [employer].  Return as needed or in 6 months for follow up visit.   Follow up plan: Return in about 6 months (around 02/29/2020) for follow up visit.  [name], MD

## 2019-09-01 NOTE — Assessment & Plan Note (Signed)
Anticipate related to deconditioning in setting of covid pandemic. 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 Check BNP today, consider updated echocardiogram in setting of worsening dyspnea.

## 2019-09-01 NOTE — Assessment & Plan Note (Signed)
Heard today. Consider updated echo in setting of progressive dyspnea.

## 2019-09-01 NOTE — Telephone Encounter (Signed)
Ok to reschedule prolia shot. Thank you.

## 2019-09-01 NOTE — Assessment & Plan Note (Addendum)
Chronic, stable on lisinopril and diltiazem.

## 2019-09-01 NOTE — Assessment & Plan Note (Signed)
Preventative protocols reviewed and updated unless pt declined. Discussed healthy diet and lifestyle.  

## 2019-09-01 NOTE — Assessment & Plan Note (Addendum)
Struggling with increase in social isolation due to Covid pandemic. Support provided. Continue prozac.

## 2019-09-01 NOTE — Patient Instructions (Addendum)
Flu shot today  Labs today.  Urinalysis today. Price out [employer] for urinary urge incontinence symptoms.  We will set you up for repeat prolia injection.  If interested, check with pharmacy about new 2 shot shingles series (shingrix).  Work on [employer].  Return as needed or in 6 months for follow up visit.   Health Maintenance After Age 83 After age 43, you are at a higher risk for certain long-term diseases and infections as well as injuries from falls. Falls are a major cause of broken bones and head injuries in people who are older than age 5. Getting regular preventive care can help to keep you healthy and well. Preventive care includes getting regular testing and making lifestyle changes as recommended by your health care provider. Talk with your health care provider about:  Which screenings and tests you should have. A screening is a test that checks for a disease when you have no symptoms.  A diet and exercise plan that is right for you. What should I know about screenings and tests to prevent falls? Screening and testing are the best ways to find a health problem early. Early diagnosis and treatment give you the best chance of managing medical conditions that are common after age 17. Certain conditions and lifestyle choices may make you more likely to have a fall. Your health care provider may recommend:  Regular vision checks. Poor vision and conditions such as cataracts can make you more likely to have a fall. If you wear glasses, make sure to get your prescription updated if your vision changes.  Medicine review. Work with your health care provider to regularly review all of the medicines you are taking, including over-the-counter medicines. Ask your health care provider about any side effects that may make you more likely to have a fall. Tell your health care provider if any medicines that you take make you feel dizzy or sleepy.  Osteoporosis screening.  Osteoporosis is a condition that causes the bones to get weaker. This can make the bones weak and cause them to break more easily.  Blood pressure screening. Blood pressure changes and medicines to control blood pressure can make you feel dizzy.  Strength and balance checks. Your health care provider may recommend certain tests to check your strength and balance while standing, walking, or changing positions.  Foot health exam. Foot pain and numbness, as well as not wearing proper footwear, can make you more likely to have a fall.  Depression screening. You may be more likely to have a fall if you have a fear of falling, feel emotionally low, or feel unable to do activities that you used to do.  Alcohol use screening. Using too much alcohol can affect your balance and may make you more likely to have a fall. What actions can I take to lower my risk of falls? General instructions  Talk with your health care provider about your risks for falling. Tell your health care provider if: ? You fall. Be sure to tell your health care provider about all falls, even ones that seem minor. ? You feel dizzy, sleepy, or off-balance.  Take over-the-counter and prescription medicines only as told by your health care provider. These include any supplements.  Eat a healthy diet and maintain a healthy weight. A healthy diet includes low-fat dairy products, low-fat (lean) meats, and fiber from whole grains, beans, and lots of fruits and vegetables. Home safety  Remove any tripping hazards, such as rugs, cords, and  clutter.  Install safety equipment such as grab bars in bathrooms and safety rails on stairs.  Keep rooms and walkways well-lit. Activity   Follow a regular exercise program to stay fit. This will help you maintain your balance. Ask your health care provider what types of exercise are appropriate for you.  If you need a cane or walker, use it as recommended by your health care provider.  Wear  supportive shoes that have nonskid soles. Lifestyle  Do not drink alcohol if your health care provider tells you not to drink.  If you drink alcohol, limit how much you have: ? 0-1 drink a day for women. ? 0-2 drinks a day for men.  Be aware of how much alcohol is in your drink. In the [place] one drink equals one typical bottle of beer (12 oz), one-half glass of wine (5 oz), or one shot of hard liquor (1 oz).  Do not use any products that contain nicotine or tobacco, such as cigarettes and e-cigarettes. If you need help quitting, ask your health care provider. Summary  Having a healthy lifestyle and getting preventive care can help to protect your health and wellness after age 56.  Screening and testing are the best way to find a health problem early and help you avoid having a fall. Early diagnosis and treatment give you the best chance for managing medical conditions that are more common for people who are older than age 43.  Falls are a major cause of broken bones and head injuries in people who are older than age 61. Take precautions to prevent a fall at home.  Work with your health care provider to learn what changes you can make to improve your health and wellness and to prevent falls. This information is not intended to replace advice given to you by your health care provider. Make sure you discuss any questions you have with your health care provider. Document Released: 10/29/2017 Document Revised: 04/08/2019 Document Reviewed: 10/29/2017 Elsevier Patient Education  2020 [employer].

## 2019-09-01 NOTE — Assessment & Plan Note (Signed)

## 2019-09-01 NOTE — Assessment & Plan Note (Addendum)
Chronic, continue pravastatin. Update FLP today. The ASCVD Risk score Mikey Bussing DC Jr., et al., 2013) failed to calculate for the following reasons:   The 2013 ASCVD risk score is only valid for ages 62 to 2

## 2019-09-01 NOTE — Assessment & Plan Note (Signed)
Levels have normalized off tums/Ca supplements.  Will restart prolia injections.

## 2019-09-06 ENCOUNTER — Other Ambulatory Visit: Payer: Self-pay | Admitting: Family Medicine

## 2019-09-06 DIAGNOSIS — R011 Cardiac murmur, unspecified: Secondary | ICD-10-CM

## 2019-09-06 DIAGNOSIS — R0609 Other forms of dyspnea: Secondary | ICD-10-CM

## 2019-09-06 DIAGNOSIS — R0600 Dyspnea, unspecified: Secondary | ICD-10-CM

## 2019-09-10 ENCOUNTER — Other Ambulatory Visit: Payer: Self-pay

## 2019-09-10 ENCOUNTER — Ambulatory Visit (HOSPITAL_COMMUNITY): Payer: Medicare Other | Attending: Cardiology

## 2019-09-10 DIAGNOSIS — R0609 Other forms of dyspnea: Secondary | ICD-10-CM | POA: Diagnosis not present

## 2019-09-10 DIAGNOSIS — R0600 Dyspnea, unspecified: Secondary | ICD-10-CM

## 2019-09-10 DIAGNOSIS — R011 Cardiac murmur, unspecified: Secondary | ICD-10-CM | POA: Diagnosis not present

## 2019-09-12 ENCOUNTER — Other Ambulatory Visit: Payer: Self-pay | Admitting: Family Medicine

## 2019-09-12 DIAGNOSIS — R0609 Other forms of dyspnea: Secondary | ICD-10-CM

## 2019-09-12 DIAGNOSIS — R0600 Dyspnea, unspecified: Secondary | ICD-10-CM

## 2019-09-12 DIAGNOSIS — I119 Hypertensive heart disease without heart failure: Secondary | ICD-10-CM

## 2019-09-15 ENCOUNTER — Ambulatory Visit (INDEPENDENT_AMBULATORY_CARE_PROVIDER_SITE_OTHER): Payer: Medicare Other | Admitting: Cardiovascular Disease

## 2019-09-15 ENCOUNTER — Other Ambulatory Visit: Payer: Self-pay

## 2019-09-15 ENCOUNTER — Encounter: Payer: Self-pay | Admitting: Cardiovascular Disease

## 2019-09-15 DIAGNOSIS — R0600 Dyspnea, unspecified: Secondary | ICD-10-CM

## 2019-09-15 DIAGNOSIS — R0609 Other forms of dyspnea: Secondary | ICD-10-CM | POA: Diagnosis not present

## 2019-09-15 DIAGNOSIS — E782 Mixed hyperlipidemia: Secondary | ICD-10-CM

## 2019-09-15 DIAGNOSIS — I10 Essential (primary) hypertension: Secondary | ICD-10-CM

## 2019-09-15 MED ORDER — VERAPAMIL HCL ER 120 MG PO TBCR: 120.0000 mg | EXTENDED_RELEASE_TABLET | Freq: Every day | ORAL | 3 refills | Status: DC

## 2019-09-15 NOTE — Progress Notes (Signed)
09/15/2019 [name]   14-Feb-1930  [id number]  Primary Physician [name], MD Primary Cardiologist: Lorretta Harp MD [name], [place]  HPI:  [name] is a 85 y.o. moderately overweight widowed Caucasian female mother of 2 living children (2 deceased), grandmother of 5 grandchildren who was referred by Dr. Danise Mina for evaluation of dyspnea.  She is very hard of hearing.  She lives in an assisted care facility but does her own cooking and driving.  Her risk factors include treated hypertension hyperlipidemia.  She is not diabetic.  She is never smoked.  There is no family history for heart disease.  She is never had a heart attack or stroke.  She does not denies chest pain.  She is complained of some dyspnea since being quarantined during COVID-19.  Recent 2D echo performed 09/10/2019 revealed normal LV systolic function with severe asymmetric left ventricular hypertrophy of the basal septal wall without S.A.M.  Is possible that her dyspnea is related to this.  I am going to start her on low-dose verapamil and will reassess.   Current Meds  Medication Sig  . acetaminophen (TYLENOL) 500 MG tablet Take 1,000 mg by mouth daily as needed.   . cholecalciferol (VITAMIN D) 1000 UNITS tablet Take 1,000 Units by mouth daily.  Marland Kitchen diltiazem (TIAZAC) 240 MG 24 hr capsule TAKE 1 CAPSULE BY MOUTH EVERY DAY  . diphenhydramine-acetaminophen (TYLENOL PM) 25-500 MG TABS Take 2 tablets by mouth at bedtime as needed.  . famotidine (PEPCID) 20 MG tablet Take 20 mg by mouth daily.  Marland Kitchen FLUoxetine (PROZAC) 20 MG tablet TAKE 1 TABLET(20 MG) BY MOUTH DAILY  . hydrochlorothiazide (HYDRODIURIL) 25 MG tablet TAKE 1 TABLET BY MOUTH EVERY DAY  . levothyroxine (SYNTHROID) 88 MCG tablet TAKE 1 TABLET BY MOUTH EVERY DAY BEFORE BREAKFAST  . lisinopril (ZESTRIL) 40 MG tablet TAKE 1 TABLET BY MOUTH TWICE DAILY  . loratadine (CLARITIN) 10 MG tablet Take 10 mg by mouth daily.  . mirabegron ER  (MYRBETRIQ) 25 MG TB24 tablet Take 1 tablet (25 mg total) by mouth daily.  . pravastatin (PRAVACHOL) 40 MG tablet TAKE 1 TABLET(40 MG) BY MOUTH DAILY  . ranitidine (ZANTAC) 150 MG tablet Take 150 mg by mouth at bedtime.   . vitamin B-12 (CYANOCOBALAMIN) 500 MCG tablet Take 1 tablet (500 mcg total) by mouth daily.     No Known Allergies  Social History   Socioeconomic History  . Marital status: Widowed    Spouse name: Not on file  . Number of children: Not on file  . Years of education: Not on file  . Highest education level: Not on file  Occupational History  . Not on file  Social Needs  . Financial resource strain: Not on file  . Food insecurity    Worry: Not on file    Inability: Not on file  . Transportation needs    Medical: Not on file    Non-medical: Not on file  Tobacco Use  . Smoking status: Former Smoker    Quit date: 12/30/1992    Years since quitting: 26.7  . Smokeless tobacco: Never Used  Substance and Sexual Activity  . Alcohol use: No  . Drug use: No  . Sexual activity: Never  Lifestyle  . Physical activity    Days per week: Not on file    Minutes per session: Not on file  . Stress: Not on file  Relationships  . Social connections  Talks on phone: Not on file    Gets together: Not on file    Attends religious service: Not on file    Active member of club or organization: Not on file    Attends meetings of clubs or organizations: Not on file    Relationship status: Not on file  . Intimate partner violence    Fear of current or ex partner: Not on file    Emotionally abused: Not on file    Physically abused: Not on file    Forced sexual activity: Not on file  Other Topics Concern  . Not on file  Social History Narrative   Widower since 04-06-1999 - husband battled cancer for 6 yrs, died from MI   Daughter Wilder Amodei) passed away Apr 06, 2015   Lies in retirement home - [facility] on [address].   Occupation: retired   HS: 12th grade   Activity: no regular  exercise   Diet: good water, fruits/vegetables daily     Review of Systems: General: negative for chills, fever, night sweats or weight changes.  Cardiovascular: negative for chest pain, dyspnea on exertion, edema, orthopnea, palpitations, paroxysmal nocturnal dyspnea or shortness of breath Dermatological: negative for rash Respiratory: negative for cough or wheezing Urologic: negative for hematuria Abdominal: negative for nausea, vomiting, diarrhea, bright red blood per rectum, melena, or hematemesis Neurologic: negative for visual changes, syncope, or dizziness All other systems reviewed and are otherwise negative except as noted above.    Blood pressure 135/66, pulse 73, temperature 98.2 F (36.8 C), height 5\' 4"  (1.626 m), weight 225 lb (102.1 kg), SpO2 100 %.  General appearance: alert and no distress Neck: no adenopathy, no carotid bruit, no JVD, supple, symmetrical, trachea midline and thyroid not enlarged, symmetric, no tenderness/mass/nodules Lungs: clear to auscultation bilaterally Heart: Soft outflow tract murmur. Extremities: extremities normal, atraumatic, no cyanosis or edema Pulses: 2+ and symmetric Skin: Skin color, texture, turgor normal. No rashes or lesions Neurologic: Alert and oriented X 3, normal strength and tone. Normal symmetric reflexes. Normal coordination and gait  EKG not performed today  ASSESSMENT AND PLAN:   Hyperlipidemia History of hyperlipidemia on statin therapy with lipid profile performed 03/31/2018 revealing total cholesterol 157, LDL 74 and HDL 53.  Hypertension History of essential hypertension with blood pressure measured today at 135/66.  She is on hydrochlorothiazide and lisinopril.  Exertional dyspnea History of dyspnea on exertion principally since she was has been quarantine in her assisted living facility.  She did have a 2D echo performed 09/10/2019 that showed normal LV function, diastolic dysfunction without significant valvular  abnormalities.  She did however had severe asymmetric septal thickening without S.A.M.  It is possible that this is contributing to her dyspnea.  We will try her on low-dose verapamil and reassess.      Lorretta Harp MD FACP,FACC,FAHA, [facility] 09/15/2019 2:09 PM

## 2019-09-15 NOTE — Assessment & Plan Note (Signed)
History of essential hypertension with blood pressure measured today at 135/66.  She is on hydrochlorothiazide and lisinopril.

## 2019-09-15 NOTE — Patient Instructions (Addendum)
Medication Instructions:  Your physician has recommended you make the following change in your medication:  START VERAPAMIL (CALAN-SR) 120 MG BY MOUTH AT BEDTIME  If you need a refill on your cardiac medications before your next appointment, please call your pharmacy.   Lab work: NONE If you have labs (blood work) drawn today and your tests are completely normal, you will receive your results only by: Marland Kitchen MyChart Message (if you have MyChart) OR . A paper copy in the mail If you have any lab test that is abnormal or we need to change your treatment, we will call you to review the results.  Testing/Procedures: NONE  Follow-Up: At [facility], you and your health needs are our priority.  As part of our continuing mission to provide you with exceptional heart care, we have created designated Provider Care Teams.  These Care Teams include your primary Cardiologist (physician) and Advanced Practice Providers (APPs -  Physician Assistants and Nurse Practitioners) who all work together to provide you with the care you need, when you need it.  You will need a follow up appointment in 4 weeks with an APP for vital sign check and follow up on your symptoms. You may see one of the following Advanced Practice Providers on your designated Care Team:   . Kerin Ransom, PA-C . Daleen Snook Kroeger, PA-C . Sande Rives, PA-C ____________________ . Almyra Deforest, PA-C . Fabian Sharp, PA-C . Jory Sims, DNP . Rhonda Barrett, PA-C .   You will need a follow up appointment in 3 months with Dr. Gwenlyn Found.

## 2019-09-15 NOTE — Assessment & Plan Note (Signed)
History of hyperlipidemia on statin therapy with lipid profile performed 03/31/2018 revealing total cholesterol 157, LDL 74 and HDL 53.

## 2019-09-15 NOTE — Assessment & Plan Note (Addendum)
History of dyspnea on exertion principally since she was has been quarantine in her assisted living facility.  She did have a 2D echo performed 09/10/2019 that showed normal LV function, diastolic dysfunction without significant valvular abnormalities.  She did however had severe asymmetric septal thickening without S.A.M.  It is possible that this is contributing to her dyspnea.  We will try her on low-dose verapamil and reassess.

## 2019-09-16 ENCOUNTER — Ambulatory Visit (INDEPENDENT_AMBULATORY_CARE_PROVIDER_SITE_OTHER): Payer: Medicare Other

## 2019-09-16 DIAGNOSIS — M810 Age-related osteoporosis without current pathological fracture: Secondary | ICD-10-CM | POA: Diagnosis not present

## 2019-09-16 MED ORDER — DENOSUMAB 60 MG/ML ~~LOC~~ SOSY
60.0000 mg | PREFILLED_SYRINGE | Freq: Once | SUBCUTANEOUS | Status: AC
  Administered 2019-09-16: 60 mg via SUBCUTANEOUS

## 2019-09-16 NOTE — Progress Notes (Signed)
Per orders of Dr. Gutierrez, injection of Prolia given by Kizzi Overbey. Patient tolerated injection well.  

## 2019-09-22 ENCOUNTER — Telehealth: Payer: Self-pay

## 2019-09-22 NOTE — Telephone Encounter (Signed)
Spoke with pt stating she restating she received a letter yesterday stating her ins will not cover the chemistry pathology.  Says they were going to send Dr. Darnell Level a letter also.

## 2019-09-22 NOTE — Telephone Encounter (Signed)
Pt said she got a letter from ins co that med,chemistry pathology, procedure code 81000  was denied by ins co on 09/01/19 and request cb from [facility]; pt said this happened before and the medicine was expensive and I asked pt again the name of the medicine and she said it had chemistry pathology. Pt did not understand that chemistry pathology is not a med. Pt request cb.

## 2019-09-27 ENCOUNTER — Telehealth: Payer: Self-pay

## 2019-09-27 NOTE — Telephone Encounter (Signed)
Pt called and she got a text about owing a bill for our office. Pt said she talked to different phone #s for [facility]; pt spoke with someone at [phone] and [phone]. Pt said she does not know who she spoke with or what was said. Pt wants help with billing.

## 2019-10-01 NOTE — Telephone Encounter (Signed)
Spoke with pt providing number for billing dept, [phone].

## 2019-10-04 ENCOUNTER — Other Ambulatory Visit: Payer: Self-pay | Admitting: Family Medicine

## 2019-10-13 ENCOUNTER — Ambulatory Visit: Payer: Medicare Other | Admitting: Physician Assistant

## 2019-10-20 ENCOUNTER — Telehealth: Payer: Self-pay

## 2019-10-20 NOTE — Telephone Encounter (Signed)
Spoke [email or web address] -[phone].  Pt's balance for Prolia on 09-16-2019 is$250.74 which is her responsible portion after insurance has paid.  Pt understands and agrees.

## 2019-10-21 ENCOUNTER — Encounter: Payer: Self-pay | Admitting: Physician Assistant

## 2019-10-21 ENCOUNTER — Ambulatory Visit (INDEPENDENT_AMBULATORY_CARE_PROVIDER_SITE_OTHER): Payer: Medicare Other | Admitting: Physician Assistant

## 2019-10-21 ENCOUNTER — Other Ambulatory Visit: Payer: Self-pay

## 2019-10-21 VITALS — BP 114/60 | HR 90 | Ht 64.0 in | Wt 228.8 lb

## 2019-10-21 DIAGNOSIS — E782 Mixed hyperlipidemia: Secondary | ICD-10-CM

## 2019-10-21 DIAGNOSIS — E039 Hypothyroidism, unspecified: Secondary | ICD-10-CM

## 2019-10-21 DIAGNOSIS — R0609 Other forms of dyspnea: Secondary | ICD-10-CM

## 2019-10-21 DIAGNOSIS — M7989 Other specified soft tissue disorders: Secondary | ICD-10-CM

## 2019-10-21 DIAGNOSIS — I10 Essential (primary) hypertension: Secondary | ICD-10-CM | POA: Diagnosis not present

## 2019-10-21 DIAGNOSIS — R0600 Dyspnea, unspecified: Secondary | ICD-10-CM | POA: Diagnosis not present

## 2019-10-21 MED ORDER — FUROSEMIDE 20 MG PO TABS: 10.0000 mg | ORAL_TABLET | Freq: Every day | ORAL | 1 refills | Status: DC

## 2019-10-21 NOTE — Patient Instructions (Addendum)
Medication Instructions:   STOP HCTZ  START Lasix 10 mg daily  *If you need a refill on your cardiac medications before your next appointment, please call your pharmacy*  Lab Work:  You will need to have labs (blood work) drawn in 1 week:  BMET If you have labs (blood work) drawn today and your tests are completely normal, you will receive your results only by: Marland Kitchen MyChart Message (if you have MyChart) OR . A paper copy in the mail If you have any lab test that is abnormal or we need to change your treatment, we will call you to review the results.  Testing/Procedures:  NONE ordered at this time of appointment   Follow-Up: At [facility], you and your health needs are our priority.  As part of our continuing mission to provide you with exceptional heart care, we have created designated Provider Care Teams.  These Care Teams include your primary Cardiologist (physician) and Advanced Practice Providers (APPs -  Physician Assistants and Nurse Practitioners) who all work together to provide you with the care you need, when you need it.  Your next appointment:   2-3 weeks  The format for your next appointment:   In Person  Provider:   You may see Quay Burow, MD or one of the following Advanced Practice Providers on your designated Care Team:    Kerin Ransom, PA-C  [place], [place]  Coletta Memos, [place]   Other Instructions  Avoid salt  Keep fluid intake to between 32 - 64 oz

## 2019-10-21 NOTE — Progress Notes (Signed)
Cardiology Office Note    Date:  10/23/2019   ID:  [name], DOB 29-Jul-1930, MRN [id number]  PCP:  [name], MD  Cardiologist:   Dr. Gwenlyn Found  Chief Complaint  Patient presents with  . Follow-up    seen for Dr. Gwenlyn Found.    History of Present Illness:  [name] is a 85 y.o. female with past medical history of hypertension, hyperlipidemia, hypothyroidism and a history of anemia.  She is very hard of hearing.  She lives in an assisted living facility.  She was referred to cardiology service for evaluation of dyspnea.  She has never had a heart attack or stroke in the past.  She also denies any recent chest pain.  She has been complaining of dyspnea since being quarantined during COVID-19.  Echocardiogram obtained on 09/10/2019 revealed a normal LV systolic function was severe isometric left ventricular hypertrophy of basal septal wall without S.A.M.  She was started on low-dose verapamil during the recent office visit by Dr. Alvester Chou on 09/15/2019.  Patient presents today for cardiology office visit.  She has not noticed any difference after addition of verapamil.  She is still very short of breath with physical exertion.  She has at least 1+ pitting edema in bilateral lower extremity.  Despite the recent normal BMP, I suspect she is at least mildly volume overloaded.  I recommend stop the HCTZ and start on Lasix 10 mg daily.  Given her left ventricular hypertrophy, I am hesitant to overdiuresis her which can potentially drop her preload.  Otherwise she denies any chest pain however her dyspnea on exertion could be anginal equivalent.  If her symptoms persist and that we do not have a good explanation for her dyspnea, we may need to consider a stress test.  Past Medical History:  Diagnosis Date  . Arthritis   . Depression   . History of anemia   . History of chicken pox   . History of ulcer disease 1990s  . HLD (hyperlipidemia)   . HTN (hypertension)   . Hypothyroidism   .  Osteoporosis 03/2016   at [facility] - T -3.1 hip, -1.0 spine  . Uses hearing aid     Past Surgical History:  Procedure Laterality Date  . APPENDECTOMY  1974  . BACK SURGERY  1979  . CARDIOVASCULAR STRESS TEST  2010   WNL  . CARPAL TUNNEL RELEASE Left 04/28/2017  . CATARACT EXTRACTION Bilateral 2012  . DEXA  03/2016   at [facility] - T -3.1 hip, -1.0 spine  . GALLBLADDER SURGERY  1974  . [place]   right  . [place]   left  . KNEE SURGERY  2008  . TONSILLECTOMY  1945  . US ECHOCARDIOGRAPHY  2010   LVEF 65-75%, overall normal, mildly decreased LV diastolic compliance    Current Medications: Outpatient Medications Prior to Visit  Medication Sig Dispense Refill  . acetaminophen (TYLENOL) 500 MG tablet Take 1,000 mg by mouth daily as needed.     . cholecalciferol (VITAMIN D) 1000 UNITS tablet Take 1,000 Units by mouth daily.    Marland Kitchen diltiazem (TIAZAC) 240 MG 24 hr capsule TAKE 1 CAPSULE BY MOUTH EVERY DAY 90 capsule 1  . diphenhydramine-acetaminophen (TYLENOL PM) 25-500 MG TABS Take 2 tablets by mouth at bedtime as needed.    . famotidine (PEPCID) 20 MG tablet Take 20 mg by mouth daily.    Marland Kitchen FLUoxetine (PROZAC) 20 MG tablet TAKE 1 TABLET(20  MG) BY MOUTH DAILY 90 tablet 1  . levothyroxine (SYNTHROID) 88 MCG tablet TAKE 1 TABLET BY MOUTH EVERY DAY BEFORE BREAKFAST 90 tablet 2  . lisinopril (ZESTRIL) 40 MG tablet TAKE 1 TABLET BY MOUTH TWICE DAILY 180 tablet 2  . loratadine (CLARITIN) 10 MG tablet Take 10 mg by mouth daily.    . mirabegron ER (MYRBETRIQ) 25 MG TB24 tablet Take 1 tablet (25 mg total) by mouth daily. 30 tablet 3  . pravastatin (PRAVACHOL) 40 MG tablet TAKE 1 TABLET(40 MG) BY MOUTH DAILY 90 tablet 1  . ranitidine (ZANTAC) 150 MG tablet Take 150 mg by mouth at bedtime.     . verapamil (CALAN-SR) 120 MG CR tablet Take 1 tablet (120 mg total) by mouth at bedtime. 90 tablet 3  . vitamin B-12 (CYANOCOBALAMIN) 500 MCG tablet Take 1 tablet (500 mcg total) by mouth  daily.    . hydrochlorothiazide (HYDRODIURIL) 25 MG tablet TAKE 1 TABLET BY MOUTH EVERY DAY 90 tablet 2   No facility-administered medications prior to visit.      Allergies:   Patient has no known allergies.   Social History   Socioeconomic History  . Marital status: Widowed    Spouse name: Not on file  . Number of children: Not on file  . Years of education: Not on file  . Highest education level: Not on file  Occupational History  . Not on file  Social Needs  . Financial resource strain: Not on file  . Food insecurity    Worry: Not on file    Inability: Not on file  . Transportation needs    Medical: Not on file    Non-medical: Not on file  Tobacco Use  . Smoking status: Former Smoker    Quit date: 12/30/1992    Years since quitting: 26.8  . Smokeless tobacco: Never Used  Substance and Sexual Activity  . Alcohol use: No  . Drug use: No  . Sexual activity: Never  Lifestyle  . Physical activity    Days per week: Not on file    Minutes per session: Not on file  . Stress: Not on file  Relationships  . Social Herbalist on phone: Not on file    Gets together: Not on file    Attends religious service: Not on file    Active member of club or organization: Not on file    Attends meetings of clubs or organizations: Not on file    Relationship status: Not on file  Other Topics Concern  . Not on file  Social History Narrative   Widower since April 09, 1999 - husband battled cancer for 6 yrs, died from MI   Daughter Kendyl Festa) passed away 04/09/15   Lies in retirement home - [facility] on [address].   Occupation: retired   HS: 12th grade   Activity: no regular exercise   Diet: good water, fruits/vegetables daily     Family History:  The patient's family history includes COPD in her sister; Cancer in her father; Cancer (age of onset: 81) in her sister; Cancer (age of onset: 110) in her sister; Cancer (age of onset: 98) in her mother; Cancer (age of onset: 16) in her  brother; Cancer (age of onset: 31) in her sister; Diabetes in her daughter; Hypertension in her daughter.   ROS:   Please see the history of present illness.    ROS All other systems reviewed and are negative.   PHYSICAL EXAM:  VS:  BP 114/60   Pulse 90   Ht 5\' 4"  (1.626 m)   Wt 228 lb 12.8 oz (103.8 kg)   SpO2 95%   BMI 39.27 kg/m    GEN: Well nourished, well developed, in no acute distress  HEENT: normal  Neck: no JVD, carotid bruits, or masses Cardiac: RRR; no murmurs, rubs, or gallops. 1+ edema  Respiratory:  clear to auscultation bilaterally, normal work of breathing GI: soft, nontender, nondistended, + BS MS: no deformity or atrophy  Skin: warm and dry, no rash Neuro:  Alert and Oriented x 3, Strength and sensation are intact Psych: euthymic mood, full affect  Wt Readings from Last 3 Encounters:  10/21/19 228 lb 12.8 oz (103.8 kg)  09/15/19 225 lb (102.1 kg)  09/01/19 223 lb 4 oz (101.3 kg)      Studies/Labs Reviewed:   EKG:  EKG is not ordered today.    Recent Labs: 08/23/2019: ALT 10; BUN 19; Creatinine, Ser 1.45; Hemoglobin 11.8; Platelets 230.0; Potassium 4.0; Sodium 140 09/01/2019: Pro B Natriuretic peptide (BNP) 95.0; TSH 1.08   Lipid Panel    Component Value Date/Time   CHOL 157 09/01/2019 1221   TRIG 143.0 09/01/2019 1221   HDL 54.20 09/01/2019 1221   CHOLHDL 3 09/01/2019 1221   VLDL 28.6 09/01/2019 1221   LDLCALC 75 09/01/2019 1221    Additional studies/ records that were reviewed today include:   Echo 09/10/2019 IMPRESSIONS    1. The left ventricle has normal systolic function with an ejection fraction of 60-65%. The cavity size was normal. There is severe asymmetric left ventricular hypertrophy of the basal septal wall. Left ventricular diastolic Doppler parameters are  consistent with impaired relaxation.  2. LVOT turbulence seen in systole without mitral valve SAM.  3. The right ventricle has mildly reduced systolic function. The cavity  was small. There is no increase in right ventricular wall thickness. Right ventricular systolic pressure is normal.  4. Left atrial size was mildly dilated.  5. Mild thickening of the mitral valve leaflet. Mild calcification of the mitral valve leaflet. No evidence of mitral valve stenosis.  6. The aortic valve is tricuspid. Mild thickening of the aortic valve. Mild calcification of the aortic valve. Aortic valve regurgitation is mild by color flow Doppler.  7. The aorta is normal unless otherwise noted.  8. The aortic root, ascending aorta and aortic arch are normal in size and structure.    ASSESSMENT:    1. Leg swelling   2. Essential hypertension   3. Dyspnea on exertion   4. Mixed hyperlipidemia   5. Hypothyroidism, unspecified type      PLAN:  In order of problems listed above:  1. Leg swelling: Start on Lasix 10 mg daily.  Discontinue hydrochlorothiazide some of her shortness of breath may be related to volume overloaded  2. Dyspnea on exertion: She has many reason to have dyspnea.  Her dyspnea seems to have occurred since the start of the pandemic.  She can only walk around wearing her facemask at the nursing home.  Recent echocardiogram shows normal EF, although LV hypertrophy is present, however this is an incidental finding  3. Hypertension: Blood pressure stable  4. Hyperlipidemia: On pravastatin  5. Hypothyroidism: Managed by primary care provider   Medication Adjustments/Labs and Tests Ordered: Current medicines are reviewed at length with the patient today.  Concerns regarding medicines are outlined above.  Medication changes, Labs and Tests ordered today are listed in the Patient Instructions below.  Patient Instructions  Medication Instructions:   STOP HCTZ  START Lasix 10 mg daily  *If you need a refill on your cardiac medications before your next appointment, please call your pharmacy*  Lab Work:  You will need to have labs (blood work) drawn in 1 week:   BMET If you have labs (blood work) drawn today and your tests are completely normal, you will receive your results only by: Marland Kitchen MyChart Message (if you have MyChart) OR . A paper copy in the mail If you have any lab test that is abnormal or we need to change your treatment, we will call you to review the results.  Testing/Procedures:  NONE ordered at this time of appointment   Follow-Up: At [facility], you and your health needs are our priority.  As part of our continuing mission to provide you with exceptional heart care, we have created designated Provider Care Teams.  These Care Teams include your primary Cardiologist (physician) and Advanced Practice Providers (APPs -  Physician Assistants and Nurse Practitioners) who all work together to provide you with the care you need, when you need it.  Your next appointment:   2-3 weeks  The format for your next appointment:   In Person  Provider:   You may see Quay Burow, MD or one of the following Advanced Practice Providers on your designated Care Team:    Kerin Ransom, PA-C  [place], [place]  Coletta Memos, [place]   Other Instructions  Avoid salt  Keep fluid intake to between 32 - 64 oz     Hilbert Corrigan, [place]  10/23/2019 11:53 PM    [facility] [address], [place], [place]  [address] Phone: [phone]; Fax: [phone]

## 2019-10-23 ENCOUNTER — Encounter: Payer: Self-pay | Admitting: Physician Assistant

## 2019-11-05 ENCOUNTER — Other Ambulatory Visit: Payer: Self-pay

## 2019-11-05 ENCOUNTER — Ambulatory Visit: Payer: Medicare Other | Admitting: Physician Assistant

## 2019-11-05 DIAGNOSIS — R0600 Dyspnea, unspecified: Secondary | ICD-10-CM

## 2019-11-05 DIAGNOSIS — M7989 Other specified soft tissue disorders: Secondary | ICD-10-CM | POA: Diagnosis not present

## 2019-11-05 DIAGNOSIS — R0609 Other forms of dyspnea: Secondary | ICD-10-CM

## 2019-11-05 NOTE — Progress Notes (Deleted)
Cardiology Office Note   Date:  11/05/2019   ID:  [name], DOB [birth year], MRN [id number]  PCP:  [name], MD Cardiologist:  Quay Burow, MD 09/15/2019 [name], [facility] 10/21/2019 Electrphysiologist: None Brittany Ferries, PA-C   No chief complaint on file.   History of Present Illness: [name] is a 85 y.o. female with a history of HTN, HLD, hypothyroid, anemia, LVH w/ nl EF on verapamil.  10/22 office visit for DOE, some +volume overload by exam, HCTZ stopped and Lasix 10 mg qd started, watch for dropping her preload too much. If DOE does not improve, get MV.   [name] presents for ***   Past Medical History:  Diagnosis Date  . Arthritis   . Depression   . History of anemia   . History of chicken pox   . History of ulcer disease 1990s  . HLD (hyperlipidemia)   . HTN (hypertension)   . Hypothyroidism   . Osteoporosis 03/2016   at [facility] - T -3.1 hip, -1.0 spine  . Uses hearing aid     Past Surgical History:  Procedure Laterality Date  . APPENDECTOMY  1974  . BACK SURGERY  1979  . CARDIOVASCULAR STRESS TEST  2010   WNL  . CARPAL TUNNEL RELEASE Left 04/28/2017  . CATARACT EXTRACTION Bilateral 2012  . DEXA  03/2016   at [facility] - T -3.1 hip, -1.0 spine  . GALLBLADDER SURGERY  1974  . [place]   right  . [place]   left  . KNEE SURGERY  2008  . TONSILLECTOMY  1945  . US ECHOCARDIOGRAPHY  2010   LVEF 65-75%, overall normal, mildly decreased LV diastolic compliance    Current Outpatient Medications  Medication Sig Dispense Refill  . acetaminophen (TYLENOL) 500 MG tablet Take 1,000 mg by mouth daily as needed.     . cholecalciferol (VITAMIN D) 1000 UNITS tablet Take 1,000 Units by mouth daily.    Marland Kitchen diltiazem (TIAZAC) 240 MG 24 hr capsule TAKE 1 CAPSULE BY MOUTH EVERY DAY 90 capsule 1  . diphenhydramine-acetaminophen (TYLENOL PM) 25-500 MG TABS Take 2 tablets by mouth at bedtime as needed.    . famotidine (PEPCID)  20 MG tablet Take 20 mg by mouth daily.    Marland Kitchen FLUoxetine (PROZAC) 20 MG tablet TAKE 1 TABLET(20 MG) BY MOUTH DAILY 90 tablet 1  . furosemide (LASIX) 20 MG tablet Take 0.5 tablets (10 mg total) by mouth daily. 45 tablet 1  . levothyroxine (SYNTHROID) 88 MCG tablet TAKE 1 TABLET BY MOUTH EVERY DAY BEFORE BREAKFAST 90 tablet 2  . lisinopril (ZESTRIL) 40 MG tablet TAKE 1 TABLET BY MOUTH TWICE DAILY 180 tablet 2  . loratadine (CLARITIN) 10 MG tablet Take 10 mg by mouth daily.    . mirabegron ER (MYRBETRIQ) 25 MG TB24 tablet Take 1 tablet (25 mg total) by mouth daily. 30 tablet 3  . pravastatin (PRAVACHOL) 40 MG tablet TAKE 1 TABLET(40 MG) BY MOUTH DAILY 90 tablet 1  . ranitidine (ZANTAC) 150 MG tablet Take 150 mg by mouth at bedtime.     . verapamil (CALAN-SR) 120 MG CR tablet Take 1 tablet (120 mg total) by mouth at bedtime. 90 tablet 3  . vitamin B-12 (CYANOCOBALAMIN) 500 MCG tablet Take 1 tablet (500 mcg total) by mouth daily.     No current facility-administered medications for this visit.     Allergies:   Patient has no known  allergies.    Social History:  The patient  reports that she quit smoking about 26 years ago. She has never used smokeless tobacco. She reports that she does not drink alcohol or use drugs.   Family History:  The patient's family history includes COPD in her sister; Cancer in her father; Cancer (age of onset: 27) in her sister; Cancer (age of onset: 57) in her sister; Cancer (age of onset: 82) in her mother; Cancer (age of onset: 21) in her brother; Cancer (age of onset: 76) in her sister; Diabetes in her daughter; Hypertension in her daughter.  She indicated that the status of her mother is unknown. She indicated that the status of her father is unknown. She indicated that the status of her brother is unknown. She indicated that the status of her neg hx is unknown.    ROS:  Please see the history of present illness. All other systems are reviewed and negative.     PHYSICAL EXAM: VS:  There were no vitals taken for this visit. , BMI There is no height or weight on file to calculate BMI. GEN: Well nourished, well developed, female in no acute distress HEENT: normal for age  Neck: no JVD, no carotid bruit, no masses Cardiac: RRR; no murmur, no rubs, or gallops Respiratory:  clear to auscultation bilaterally, normal work of breathing GI: soft, nontender, nondistended, + BS MS: no deformity or atrophy; no edema; distal pulses are 2+ in all 4 extremities  Skin: warm and dry, no rash Neuro:  Strength and sensation are intact Psych: euthymic mood, full affect   EKG:  EKG {ACTION; IS/IS XHB:[id number]} ordered today. The ekg ordered today demonstrates ***  ECHO: ***  CATH: ***  MONITOR: ***   Recent Labs: 08/23/2019: ALT 10; BUN 19; Creatinine, Ser 1.45; Hemoglobin 11.8; Platelets 230.0; Potassium 4.0; Sodium 140 09/01/2019: Pro B Natriuretic peptide (BNP) 95.0; TSH 1.08  CBC    Component Value Date/Time   WBC 4.5 08/23/2019 0903   RBC 4.04 08/23/2019 0903   HGB 11.8 (L) 08/23/2019 0903   HCT 36.2 08/23/2019 0903   PLT 230.0 08/23/2019 0903   MCV 89.6 08/23/2019 0903   MCH 28.9 07/29/2019 1425   MCHC 32.7 08/23/2019 0903   RDW 13.2 08/23/2019 0903   LYMPHSABS 1.5 08/23/2019 0903   MONOABS 0.4 08/23/2019 0903   EOSABS 0.1 08/23/2019 0903   BASOSABS 0.1 08/23/2019 0903   CMP Latest Ref Rng & Units 08/23/2019 08/23/2019 07/29/2019  Glucose 70 - 99 mg/dL - 89 110(H)  BUN 6 - 23 mg/dL - 19 16  Creatinine 0.40 - 1.20 mg/dL - 1.45(H) 1.44(H)  Sodium 135 - 145 mEq/L - 140 142  Potassium 3.5 - 5.1 mEq/L - 4.0 3.8  Chloride 96 - 112 mEq/L - 105 108  CO2 19 - 32 mEq/L - 28 28  Calcium 8.6 - 10.4 mg/dL 10.4 10.4 10.1  Total Protein 6.0 - 8.3 g/dL - 7.3 7.3  Total Bilirubin 0.2 - 1.2 mg/dL - 0.5 0.5  Alkaline Phos 39 - 117 U/L - 61 64  AST 0 - 37 U/L - 15 16  ALT 0 - 35 U/L - 10 10     Lipid Panel Lab Results  Component Value Date   CHOL 157  09/01/2019   HDL 54.20 09/01/2019   LDLCALC 75 09/01/2019   TRIG 143.0 09/01/2019   CHOLHDL 3 09/01/2019      Wt Readings from Last 3 Encounters:  10/21/19 228 lb 12.8 oz (  103.8 kg)  09/15/19 225 lb (102.1 kg)  09/01/19 223 lb 4 oz (101.3 kg)     Other studies Reviewed: Additional studies/ records that were reviewed today include: Office notes, hospital records and testing.  ASSESSMENT AND PLAN:  1.  ***   Current medicines are reviewed at length with the patient today.  The patient {ACTIONS; HAS/DOES NOT HAVE:19233} concerns regarding medicines.  The following changes have been made:  {PLAN; NO CHANGE:13088:s}  Labs/ tests ordered today include:  No orders of the defined types were placed in this encounter.    Disposition:   FU with Quay Burow, MD  Signed, Brittany Ferries, PA-C  11/05/2019 12:16 PM    [place] Phone: [phone]; Fax: [phone]

## 2019-11-06 LAB — BASIC METABOLIC PANEL
BUN/Creatinine Ratio: 14 (ref 12–28)
BUN: 19 mg/dL (ref 8–27)
CO2: 23 mmol/L (ref 20–29)
Calcium: 9.7 mg/dL (ref 8.7–10.3)
Chloride: 95 mmol/L — ABNORMAL LOW (ref 96–106)
Creatinine, Ser: 1.4 mg/dL — ABNORMAL HIGH (ref 0.57–1.00)
GFR calc Af Amer: 39 mL/min/{1.73_m2} — ABNORMAL LOW (ref >59)
GFR calc non Af Amer: 34 mL/min/{1.73_m2} — ABNORMAL LOW (ref >59)
Glucose: 82 mg/dL (ref 65–99)
Potassium: 4.6 mmol/L (ref 3.5–5.2)
Sodium: 130 mmol/L — ABNORMAL LOW (ref 134–144)

## 2019-11-09 ENCOUNTER — Encounter: Payer: Self-pay | Admitting: Family Medicine

## 2019-11-09 ENCOUNTER — Ambulatory Visit (INDEPENDENT_AMBULATORY_CARE_PROVIDER_SITE_OTHER): Payer: Medicare Other | Admitting: Family Medicine

## 2019-11-09 ENCOUNTER — Other Ambulatory Visit: Payer: Self-pay

## 2019-11-09 VITALS — BP 120/62 | HR 68 | Temp 98.1°F | Ht 64.0 in | Wt 223.1 lb

## 2019-11-09 DIAGNOSIS — M810 Age-related osteoporosis without current pathological fracture: Secondary | ICD-10-CM

## 2019-11-09 DIAGNOSIS — I10 Essential (primary) hypertension: Secondary | ICD-10-CM

## 2019-11-09 DIAGNOSIS — F331 Major depressive disorder, recurrent, moderate: Secondary | ICD-10-CM | POA: Diagnosis not present

## 2019-11-09 DIAGNOSIS — Z1231 Encounter for screening mammogram for malignant neoplasm of breast: Secondary | ICD-10-CM

## 2019-11-09 DIAGNOSIS — R0609 Other forms of dyspnea: Secondary | ICD-10-CM

## 2019-11-09 DIAGNOSIS — R0600 Dyspnea, unspecified: Secondary | ICD-10-CM | POA: Diagnosis not present

## 2019-11-09 MED ORDER — FLUOXETINE HCL 20 MG PO TABS: 40.0000 mg | ORAL_TABLET | Freq: Every day | ORAL | 1 refills | Status: DC

## 2019-11-09 NOTE — Patient Instructions (Addendum)
We will set you up for bone density scan and mammogram at [facility]. Increase prozac to 40mg  daily.  Continue other current medicines.  Keep heart doctor appointment next month.  Return as needed or in 4 months for follow up visit.

## 2019-11-09 NOTE — Assessment & Plan Note (Addendum)
Ongoing struggle with social isolation due to covid pandemic. Also grand daughter moved to [place] which has been difficult for patient. Reasonable to try higher prozac dose - increase to 40mg  daily.

## 2019-11-09 NOTE — Progress Notes (Signed)
This visit was conducted in person.  BP 120/62 (BP Location: Left Arm, Patient Position: Sitting, Cuff Size: Large)   Pulse 68   Temp 98.1 F (36.7 C) (Temporal)   Ht 5\' 4"  (1.626 m)   Wt 223 lb 1 oz (101.2 kg)   SpO2 98%   BMI 38.29 kg/m    CC: 3 mo f/u visit Subjective:    Patient ID: [name], female    DOB: 04-18-1930, 85 y.o.   MRN: [id number]  HPI: [name] is a 85 y.o. female presenting on 11/09/2019 for Depression (Here for f/u.  Pt accompanied by daughter, [name] (temp, 98.4).)   See prior note for details. Seen here 08/2019 for wellness visit. For exertional dyspnea, echo was overall ok but did have severe asymmetric septal thickening - started on verapamil by cardiology. hctz was also changed to lasix 10mg  daily. No significant improvement with these changes. Even dyspnea with eating.   Calcium levels had improved so prolia restarted. Considering updated DEXA. Last prolia 09/16/2019. Requests PM scheduling.   Some constipation treated with enema by granddaughter who is Therapist, sports.  Appetite down.  Maintains oxygen levels.  Tolerating myrbetriq (started 08/2019).      Relevant past medical, surgical, family and social history reviewed and updated as indicated. Interim medical history since our last visit reviewed. Allergies and medications reviewed and updated. Outpatient Medications Prior to Visit  Medication Sig Dispense Refill  . acetaminophen (TYLENOL) 500 MG tablet Take 1,000 mg by mouth daily as needed.     . cholecalciferol (VITAMIN D) 1000 UNITS tablet Take 1,000 Units by mouth daily.    Marland Kitchen diltiazem (TIAZAC) 240 MG 24 hr capsule TAKE 1 CAPSULE BY MOUTH EVERY DAY 90 capsule 1  . diphenhydramine-acetaminophen (TYLENOL PM) 25-500 MG TABS Take 2 tablets by mouth at bedtime as needed.    . famotidine (PEPCID) 20 MG tablet Take 20 mg by mouth daily.    . furosemide (LASIX) 20 MG tablet Take 0.5 tablets (10 mg total) by mouth daily. 45 tablet 1  . levothyroxine  (SYNTHROID) 88 MCG tablet TAKE 1 TABLET BY MOUTH EVERY DAY BEFORE BREAKFAST 90 tablet 2  . lisinopril (ZESTRIL) 40 MG tablet TAKE 1 TABLET BY MOUTH TWICE DAILY 180 tablet 2  . loratadine (CLARITIN) 10 MG tablet Take 10 mg by mouth daily.    . mirabegron ER (MYRBETRIQ) 25 MG TB24 tablet Take 1 tablet (25 mg total) by mouth daily. 30 tablet 3  . pravastatin (PRAVACHOL) 40 MG tablet TAKE 1 TABLET(40 MG) BY MOUTH DAILY 90 tablet 1  . verapamil (CALAN-SR) 120 MG CR tablet Take 1 tablet (120 mg total) by mouth at bedtime. 90 tablet 3  . vitamin B-12 (CYANOCOBALAMIN) 500 MCG tablet Take 1 tablet (500 mcg total) by mouth daily.    Marland Kitchen FLUoxetine (PROZAC) 20 MG tablet TAKE 1 TABLET(20 MG) BY MOUTH DAILY 90 tablet 1  . ranitidine (ZANTAC) 150 MG tablet Take 150 mg by mouth at bedtime.      No facility-administered medications prior to visit.      Per HPI unless specifically indicated in ROS section below Review of Systems Objective:    BP 120/62 (BP Location: Left Arm, Patient Position: Sitting, Cuff Size: Large)   Pulse 68   Temp 98.1 F (36.7 C) (Temporal)   Ht 5\' 4"  (1.626 m)   Wt 223 lb 1 oz (101.2 kg)   SpO2 98%   BMI 38.29 kg/m   Wt Readings from  Last 3 Encounters:  11/09/19 223 lb 1 oz (101.2 kg)  10/21/19 228 lb 12.8 oz (103.8 kg)  09/15/19 225 lb (102.1 kg)    Physical Exam Vitals signs and nursing note reviewed.  Constitutional:      General: She is not in acute distress.    Appearance: Normal appearance. She is obese. She is not ill-appearing.     Comments: Ambulates with assistance of rollator walker  HENT:     Mouth/Throat:     Mouth: Mucous membranes are moist.     Pharynx: Oropharynx is clear. No posterior oropharyngeal erythema.  Eyes:     Extraocular Movements: Extraocular movements intact.     Pupils: Pupils are equal, round, and reactive to light.  Neck:     Thyroid: No thyromegaly or thyroid tenderness.  Cardiovascular:     Rate and Rhythm: Normal rate and  regular rhythm.     Pulses: Normal pulses.     Heart sounds: Normal heart sounds. No murmur.  Pulmonary:     Effort: Pulmonary effort is normal. No respiratory distress.     Breath sounds: Normal breath sounds. No wheezing, rhonchi or rales.  Musculoskeletal:     Right lower leg: No edema.     Left lower leg: No edema.     Comments: Compression stockings in place  Neurological:     Mental Status: She is alert.  Psychiatric:        Mood and Affect: Mood normal.        Behavior: Behavior normal.       Results for orders placed or performed in visit on 52/84/13  Basic metabolic panel  Result Value Ref Range   Glucose 82 65 - 99 mg/dL   BUN 19 8 - 27 mg/dL   Creatinine, Ser 1.40 (H) 0.57 - 1.00 mg/dL   GFR calc non Af Amer 34 (L) >59 mL/min/1.73   GFR calc Af Amer 39 (L) >59 mL/min/1.73   BUN/Creatinine Ratio 14 12 - 28   Sodium 130 (L) 134 - 144 mmol/L   Potassium 4.6 3.5 - 5.2 mmol/L   Chloride 95 (L) 96 - 106 mmol/L   CO2 23 20 - 29 mmol/L   Calcium 9.7 8.7 - 10.3 mg/dL   Depression screen [facility] 2/9 11/09/2019 09/01/2019 03/31/2018 03/19/2017 03/07/2016  Decreased Interest 2 0 0 0 0  Down, Depressed, Hopeless 3 0 0 0 0  PHQ - 2 Score 5 0 0 0 0  Altered sleeping 0 - 0 - -  Tired, decreased energy 3 - 0 - -  Change in appetite 3 - 0 - -  Feeling bad or failure about yourself  3 - 0 - -  Trouble concentrating 1 - 0 - -  Moving slowly or fidgety/restless 0 - 0 - -  Suicidal thoughts 0 - 0 - -  PHQ-9 Score 15 - 0 - -  Difficult doing work/chores - - Not difficult at all - -    GAD 7 : Generalized Anxiety Score 11/09/2019  Nervous, Anxious, on Edge 3  Control/stop worrying 3  Worry too much - different things 3  Trouble relaxing 1  Restless 2  Easily annoyed or irritable 0  Afraid - awful might happen 0  Total GAD 7 Score 12   Assessment & Plan:   Problem List Items Addressed This Visit    Osteoporosis    Prolia restarted 05/2019.  Will update DEXA      Relevant Orders    DG  Bone Density   MDD (major depressive disorder), recurrent episode, moderate ([place])    Ongoing struggle with social isolation due to covid pandemic. Also grand daughter moved to [place] which has been difficult for patient. Reasonable to try higher prozac dose - increase to 40mg  daily.       Relevant Medications   FLUoxetine (PROZAC) 20 MG tablet   Hypertension    Chronic, stable. Continue current regimen.       Exertional dyspnea - Primary    Ongoing. Undergoing cardiac evaluation overall stable at this time. No significant improvement with addition of verapamil or low dose lasix. Normal ambulatory pulse ox 07/2019. Daughter thinks there may be a component of anxiety to dyspnea symptom. Reasonable to try high prozac dose. Keep planned cards f/u next month.        Other Visit Diagnoses    Encounter for screening mammogram for malignant neoplasm of breast       Relevant Orders   MM Digital Screening       Meds ordered this encounter  Medications  . FLUoxetine (PROZAC) 20 MG tablet    Sig: Take 2 tablets (40 mg total) by mouth daily.    Dispense:  180 tablet    Refill:  1   Orders Placed This Encounter  Procedures  . MM Digital Screening    Standing Status:   Future    Standing Expiration Date:   01/08/2021    Scheduling Instructions:     [facility]    Order Specific Question:   Reason for Exam (SYMPTOM  OR DIAGNOSIS REQUIRED)    Answer:   breast cancer screen    Order Specific Question:   Preferred imaging location?    Answer:   External  . DG Bone Density    Standing Status:   Future    Standing Expiration Date:   01/08/2021    Scheduling Instructions:     [facility]    Order Specific Question:   Reason for Exam (SYMPTOM  OR DIAGNOSIS REQUIRED)    Answer:   osteoporosis    Order Specific Question:   Preferred imaging location?    Answer:   External    Patient Instructions  We will set you up for bone density scan and mamm[facility] at [facility]. Increase prozac to 40mg  daily.   Continue other current medicines.  Keep heart doctor appointment next month.  Return as needed or in 4 months for follow up visit.    Follow up plan: Return in about 4 months (around 03/08/2020) for follow up visit.  [name], MD

## 2019-11-09 NOTE — Assessment & Plan Note (Addendum)
Prolia restarted 05/2019.  Will update DEXA

## 2019-11-09 NOTE — Assessment & Plan Note (Signed)
Ongoing. Undergoing cardiac evaluation overall stable at this time. No significant improvement with addition of verapamil or low dose lasix. Normal ambulatory pulse ox 07/2019. Daughter thinks there may be a component of anxiety to dyspnea symptom. Reasonable to try high prozac dose. Keep planned cards f/u next month.

## 2019-11-09 NOTE — Assessment & Plan Note (Signed)
Chronic, stable. Continue current regimen. 

## 2019-11-10 ENCOUNTER — Encounter: Payer: Self-pay | Admitting: Family Medicine

## 2019-11-10 NOTE — Progress Notes (Signed)
The patient has been notified of the result and verbalized understanding.  All questions (if any) were answered. Jacqulynn Cadet, CMA 11/10/2019 11:22 AM

## 2019-11-23 DIAGNOSIS — Z1231 Encounter for screening mammogram for malignant neoplasm of breast: Secondary | ICD-10-CM | POA: Diagnosis not present

## 2019-11-23 DIAGNOSIS — Z96653 Presence of artificial knee joint, bilateral: Secondary | ICD-10-CM | POA: Diagnosis not present

## 2019-11-23 DIAGNOSIS — M810 Age-related osteoporosis without current pathological fracture: Secondary | ICD-10-CM | POA: Diagnosis not present

## 2019-11-23 DIAGNOSIS — M8589 Other specified disorders of bone density and structure, multiple sites: Secondary | ICD-10-CM | POA: Diagnosis not present

## 2019-11-23 LAB — HM MAMMOGRAPHY

## 2019-11-30 ENCOUNTER — Other Ambulatory Visit: Payer: Self-pay

## 2019-11-30 MED ORDER — PRAVASTATIN SODIUM 40 MG PO TABS: ORAL_TABLET | ORAL | 1 refills | Status: DC

## 2019-12-06 ENCOUNTER — Telehealth: Payer: Self-pay | Admitting: Family Medicine

## 2019-12-06 NOTE — Telephone Encounter (Signed)
Pt notified as instructed by phone.  Verbalizes understanding.

## 2019-12-06 NOTE — Telephone Encounter (Signed)
plz notify bone density scan from [facility] returned showing persistent osteoporosis. rec continued prolia Q6 mo, good calcium and vit D intake.

## 2019-12-17 ENCOUNTER — Ambulatory Visit (INDEPENDENT_AMBULATORY_CARE_PROVIDER_SITE_OTHER): Payer: Medicare Other | Admitting: Cardiovascular Disease

## 2019-12-17 ENCOUNTER — Encounter: Payer: Self-pay | Admitting: Cardiovascular Disease

## 2019-12-17 ENCOUNTER — Encounter (INDEPENDENT_AMBULATORY_CARE_PROVIDER_SITE_OTHER): Payer: Self-pay

## 2019-12-17 ENCOUNTER — Other Ambulatory Visit: Payer: Self-pay

## 2019-12-17 VITALS — BP 146/68 | HR 69 | Temp 97.2°F | Ht 64.0 in | Wt 220.0 lb

## 2019-12-17 DIAGNOSIS — E782 Mixed hyperlipidemia: Secondary | ICD-10-CM | POA: Diagnosis not present

## 2019-12-17 DIAGNOSIS — R0609 Other forms of dyspnea: Secondary | ICD-10-CM

## 2019-12-17 DIAGNOSIS — Z79899 Other long term (current) drug therapy: Secondary | ICD-10-CM

## 2019-12-17 DIAGNOSIS — R011 Cardiac murmur, unspecified: Secondary | ICD-10-CM | POA: Diagnosis not present

## 2019-12-17 DIAGNOSIS — I10 Essential (primary) hypertension: Secondary | ICD-10-CM

## 2019-12-17 DIAGNOSIS — R0600 Dyspnea, unspecified: Secondary | ICD-10-CM

## 2019-12-17 MED ORDER — VERAPAMIL HCL ER 240 MG PO TBCR: 240.0000 mg | EXTENDED_RELEASE_TABLET | Freq: Every day | ORAL | 1 refills | Status: DC

## 2019-12-17 NOTE — Progress Notes (Signed)
12/17/2019 [name]   07-24-1930  [id number]  Primary Physician Brittany Bush, MD Primary Cardiologist: Brittany Harp MD [name], [place]  HPI:  [name] is a 85 y.o.  moderately overweight widowed Caucasian female mother of 2 living children (2 deceased), grandmother of 5 grandchildren who was referred by Dr. Danise Wu for evaluation of dyspnea.  She is very hard of hearing.    I last saw her in the office 09/15/2019.  She is accompanied by her daughter [name] today.  She did see see how [name] in the office 10/21/2019 who put her on furosemide.  She lives in an assisted care facility but does her own cooking and driving.  Her risk factors include treated hypertension hyperlipidemia.  She is not diabetic.  She is never smoked.  There is no family history for heart disease.  She is never had a heart attack or stroke.  She does not denies chest pain.  She is complained of some dyspnea since being quarantined during COVID-19.  Recent 2D echo performed 09/10/2019 revealed normal LV systolic function with severe asymmetric left ventricular hypertrophy of the basal septal wall without S.A.M.  Is possible that her dyspnea is related to this.  I am going to start her on low-dose verapamil and will reassess  Since I saw her 3 months ago her edema has somewhat improved.  She is minimally ambulatory.  She lives in an assisted care facility.  She denies chest pain.  For some reason, she is on both diltiazem and verapamil.  She does have basal septal hypertrophy with outflow tract obstruction. Stop her diltiazem and increase her verapamil.   Current Meds  Medication Sig  . acetaminophen (TYLENOL) 500 MG tablet Take 1,000 mg by mouth daily as needed.   . cholecalciferol (VITAMIN D) 1000 UNITS tablet Take 1,000 Units by mouth daily.  [name] Kitchen denosumab (PROLIA) 60 MG/ML SOSY injection Inject 60 mg into the skin every 6 (six) months.  . diphenhydramine-acetaminophen (TYLENOL PM) 25-500 MG TABS  Take 2 tablets by mouth at bedtime as needed.  . famotidine (PEPCID) 20 MG tablet Take 20 mg by mouth daily.  [name] Kitchen FLUoxetine (PROZAC) 20 MG tablet Take 2 tablets (40 mg total) by mouth daily.  . furosemide (LASIX) 20 MG tablet Take 0.5 tablets (10 mg total) by mouth daily.  [name] Kitchen levothyroxine (SYNTHROID) 88 MCG tablet TAKE 1 TABLET BY MOUTH EVERY DAY BEFORE BREAKFAST  . lisinopril (ZESTRIL) 40 MG tablet TAKE 1 TABLET BY MOUTH TWICE DAILY  . loratadine (CLARITIN) 10 MG tablet Take 10 mg by mouth daily.  . mirabegron ER (MYRBETRIQ) 25 MG TB24 tablet Take 1 tablet (25 mg total) by mouth daily.  . pravastatin (PRAVACHOL) 40 MG tablet TAKE 1 TABLET(40 MG) BY MOUTH DAILY  . verapamil (CALAN-SR) 120 MG CR tablet Take 1 tablet (120 mg total) by mouth at bedtime.  . vitamin B-12 (CYANOCOBALAMIN) 500 MCG tablet Take 1 tablet (500 mcg total) by mouth daily.  . [DISCONTINUED] diltiazem (TIAZAC) 240 MG 24 hr capsule TAKE 1 CAPSULE BY MOUTH EVERY DAY     No Known Allergies  Social History   Socioeconomic History  . Marital status: Widowed    Spouse name: Not on file  . Number of children: Not on file  . Years of education: Not on file  . Highest education level: Not on file  Occupational History  . Not on file  Tobacco Use  . Smoking status: Former Smoker  Quit date: 12/30/1992    Years since quitting: 26.9  . Smokeless tobacco: Never Used  Substance and Sexual Activity  . Alcohol use: No  . Drug use: No  . Sexual activity: Never  Other Topics Concern  . Not on file  Social History Narrative   Widower since 04/12/1999 - husband battled cancer for 6 yrs, died from MI   Daughter Marguarite Markov) passed away 2015/04/12   Lies in retirement home - [facility] on [address] [place].   Occupation: retired   HS: 12th grade   Activity: no regular exercise   Diet: good water, fruits/vegetables daily   Social Determinants of Health   Financial Resource Strain:   . Difficulty of Paying Living Expenses: Not on file  Food  Insecurity:   . Worried About Charity fundraiser in the Last Year: Not on file  . Ran Out of Food in the Last Year: Not on file  Transportation Needs:   . Lack of Transportation (Medical): Not on file  . Lack of Transportation (Non-Medical): Not on file  Physical Activity:   . Days of Exercise per Week: Not on file  . Minutes of Exercise per Session: Not on file  Stress:   . Feeling of Stress : Not on file  Social Connections:   . Frequency of Communication with Friends and Family: Not on file  . Frequency of Social Gatherings with Friends and Family: Not on file  . Attends Religious Services: Not on file  . Active Member of Clubs or Organizations: Not on file  . Attends Archivist Meetings: Not on file  . Marital Status: Not on file  Intimate Partner Violence:   . Fear of Current or Ex-Partner: Not on file  . Emotionally Abused: Not on file  . Physically Abused: Not on file  . Sexually Abused: Not on file     Review of Systems: General: negative for chills, fever, night sweats or weight changes.  Cardiovascular: negative for chest pain, dyspnea on exertion, edema, orthopnea, palpitations, paroxysmal nocturnal dyspnea or shortness of breath Dermatological: negative for rash Respiratory: negative for cough or wheezing Urologic: negative for hematuria Abdominal: negative for nausea, vomiting, diarrhea, bright red blood per rectum, melena, or hematemesis Neurologic: negative for visual changes, syncope, or dizziness All other systems reviewed and are otherwise negative except as noted above.    Blood pressure (!) 146/68, pulse 69, temperature (!) 97.2 F (36.2 C), height 5\' 4"  (1.626 m), weight 220 lb (99.8 kg), SpO2 100 %.  General appearance: alert and no distress Neck: no adenopathy, no carotid bruit, no JVD, supple, symmetrical, trachea midline and thyroid not enlarged, symmetric, no tenderness/mass/nodules Lungs: clear to auscultation bilaterally Heart: Soft  outflow tract murmur Extremities: extremities normal, atraumatic, no cyanosis or edema Pulses: 2+ and symmetric Skin: Skin color, texture, turgor normal. No rashes or lesions Neurologic: Alert and oriented X 3, normal strength and tone. Normal symmetric reflexes. Normal coordination and gait  EKG not performed today  ASSESSMENT AND PLAN:   Systolic murmur History of systolic murmur with severe asymmetric LVH and basal septal wall thickening with turbulence in systole probably responsible for this.  Hyperlipidemia History of hyperlipidemia on Pravachol with fasting lipid profile performed 09/01/2019 revealing total cholesterol of 57, LDL 75 HDL 54  Hypertension History of essential hypertension blood pressure measured today 146/68.  She apparently is on diltiazem and verapamil.  I am going to discontinue the diltiazem and increase the verapamil SR from 120 mg a day to  240 mg a day   Exertional dyspnea History of dyspnea on exertion some lower extreme edema somewhat improved on 10 mg of Lasix.  We will increase to 20 mg Lasix, check a basic metabolic panel in 2 weeks, follow-up with Almyra Deforest, PA-C in 6 weeks and me in 6 months.      Brittany Harp MD FACP,FACC,FAHA, [facility] 12/17/2019 3:37 PM

## 2019-12-17 NOTE — Assessment & Plan Note (Signed)
History of hyperlipidemia on Pravachol with fasting lipid profile performed 09/01/2019 revealing total cholesterol of 57, LDL 75 HDL 54

## 2019-12-17 NOTE — Assessment & Plan Note (Signed)
History of systolic murmur with severe asymmetric LVH and basal septal wall thickening with turbulence in systole probably responsible for this.

## 2019-12-17 NOTE — Assessment & Plan Note (Signed)
History of dyspnea on exertion some lower extreme edema somewhat improved on 10 mg of Lasix.  We will increase to 20 mg Lasix, check a basic metabolic panel in 2 weeks, follow-up with Almyra Deforest, PA-C in 6 weeks and me in 6 months.

## 2019-12-17 NOTE — Assessment & Plan Note (Signed)
History of essential hypertension blood pressure measured today 146/68.  She apparently is on diltiazem and verapamil.  I am going to discontinue the diltiazem and increase the verapamil SR from 120 mg a day to 240 mg a day

## 2019-12-17 NOTE — Patient Instructions (Addendum)
Medication Instructions:   INCREASE LASIX TO 20 MG DAILY  INCREASE VERAPAMIL TO 240 MG DAILY   STOP DILTIAZEM 240 MG DAILY  *If you need a refill on your cardiac medications before your next appointment, please call your pharmacy*  Lab Work: Your physician recommends that you return for lab work in: [place]  If you have labs (blood work) drawn today and your tests are completely normal, you will receive your results only by: Marland Kitchen MyChart Message (if you have MyChart) OR . A paper copy in the mail If you have any lab test that is abnormal or we need to change your treatment, we will call you to review the results.  Testing/Procedures: NONE ordered at this time of appointment   Follow-Up: At [facility], you and your health needs are our priority.  As part of our continuing mission to provide you with exceptional heart care, we have created designated Provider Care Teams.  These Care Teams include your primary Cardiologist (physician) and Advanced Practice Providers (APPs -  Physician Assistants and Nurse Practitioners) who all work together to provide you with the care you need, when you need it.  Your next appointment:   6 week(s) WITH HAO MENG, PA-C 6 MONTHS WITH Quay Burow, MD  The format for your next appointment:   In Person  Provider:   Quay Burow, MD AND HAO MENG, PA-C   Other Instructions

## 2019-12-20 ENCOUNTER — Other Ambulatory Visit: Payer: Self-pay

## 2019-12-20 MED ORDER — MIRABEGRON ER 25 MG PO TB24: 25.0000 mg | ORAL_TABLET | Freq: Every day | ORAL | 3 refills | Status: DC

## 2019-12-30 ENCOUNTER — Other Ambulatory Visit: Payer: Self-pay | Admitting: Cardiovascular Disease

## 2019-12-30 NOTE — Telephone Encounter (Signed)
*  STAT* If patient is at the pharmacy, call can be transferred to refill team.   1. Which medications need to be refilled? (please list name of each medication and dose if known) furosemide (LASIX) 20 MG tablet  2. Which pharmacy/location (including street and city if local pharmacy) is medication to be sent to?[facility] [place], [place] AT [address] (Ph: [phone])  3. Do they need a 30 day or 90 day supply? 90 day supply

## 2020-01-04 MED ORDER — FUROSEMIDE 20 MG PO TABS: 10.0000 mg | ORAL_TABLET | Freq: Every day | ORAL | 3 refills | Status: DC

## 2020-01-04 NOTE — Telephone Encounter (Signed)
Rx has been sent to the pharmacy electronically. ° °

## 2020-01-10 ENCOUNTER — Other Ambulatory Visit: Payer: Self-pay

## 2020-01-10 DIAGNOSIS — Z79899 Other long term (current) drug therapy: Secondary | ICD-10-CM

## 2020-01-11 ENCOUNTER — Telehealth: Payer: Self-pay

## 2020-01-11 LAB — BASIC METABOLIC PANEL
BUN/Creatinine Ratio: 16 (ref 12–28)
BUN: 30 mg/dL — ABNORMAL HIGH (ref 8–27)
CO2: 24 mmol/L (ref 20–29)
Calcium: 9.3 mg/dL (ref 8.7–10.3)
Chloride: 102 mmol/L (ref 96–106)
Creatinine, Ser: 1.89 mg/dL — ABNORMAL HIGH (ref 0.57–1.00)
GFR calc Af Amer: 27 mL/min/{1.73_m2} — ABNORMAL LOW (ref >59)
GFR calc non Af Amer: 24 mL/min/{1.73_m2} — ABNORMAL LOW (ref >59)
Glucose: 104 mg/dL — ABNORMAL HIGH (ref 65–99)
Potassium: 4 mmol/L (ref 3.5–5.2)
Sodium: 142 mmol/L (ref 134–144)

## 2020-01-11 MED ORDER — FUROSEMIDE 20 MG PO TABS: 10.0000 mg | ORAL_TABLET | Freq: Every day | ORAL | 3 refills | Status: DC

## 2020-01-11 NOTE — Telephone Encounter (Signed)
Called to give pt results. After telling pt to decrease lasix to 10mg  daily, line was disconnected. Will get schedulers to make f/u appt.

## 2020-01-11 NOTE — Telephone Encounter (Signed)
-----   Message from Lorretta Harp, MD sent at 01/11/2020 10:36 AM EST ----- Serum creatinine has increased from 1.4 up to 1.9 as result of increasing her diuretic.  Decrease from 20 mg down to 10 mg daily.  Follow-up with Almyra Deforest within the next 7 to 10 days.

## 2020-01-17 ENCOUNTER — Encounter (INDEPENDENT_AMBULATORY_CARE_PROVIDER_SITE_OTHER): Payer: Self-pay

## 2020-01-17 ENCOUNTER — Ambulatory Visit: Payer: Medicare Other | Admitting: Physician Assistant

## 2020-01-17 ENCOUNTER — Other Ambulatory Visit: Payer: Self-pay

## 2020-01-17 ENCOUNTER — Encounter: Payer: Self-pay | Admitting: Physician Assistant

## 2020-01-17 VITALS — BP 123/66 | HR 86 | Ht 64.0 in | Wt 227.6 lb

## 2020-01-17 DIAGNOSIS — I10 Essential (primary) hypertension: Secondary | ICD-10-CM

## 2020-01-17 DIAGNOSIS — E782 Mixed hyperlipidemia: Secondary | ICD-10-CM

## 2020-01-17 DIAGNOSIS — N289 Disorder of kidney and ureter, unspecified: Secondary | ICD-10-CM | POA: Diagnosis not present

## 2020-01-17 DIAGNOSIS — M7989 Other specified soft tissue disorders: Secondary | ICD-10-CM

## 2020-01-17 DIAGNOSIS — N189 Chronic kidney disease, unspecified: Secondary | ICD-10-CM

## 2020-01-17 NOTE — Progress Notes (Signed)
Cardiology Office Note:    Date:  01/19/2020   ID:  [name], DOB 1930-11-29, MRN [id number]  PCP:  [name], MD  Cardiologist:  Quay Burow, MD  Electrophysiologist:  None   Referring MD: [name], MD   Chief Complaint  Patient presents with   Follow-up    seen for Dr. Gwenlyn Found.    History of Present Illness:    NOVEMBER SYPHER is a 86 y.o. female with a hx of hypertension, hyperlipidemia, hypothyroidism and a history of anemia.  She is very hard of hearing.  She lives in an assisted living facility.  She was referred to cardiology service for evaluation of dyspnea.  She has never had a heart attack or stroke in the past. She has been complaining of dyspnea since being quarantined during COVID-19.  Echocardiogram obtained on 09/10/2019 revealed a normal LV systolic function was severe isometric left ventricular hypertrophy of basal septal wall without S.A.M.  She was started on low-dose verapamil by Dr. Gwenlyn Found on 09/15/2019.  I last saw the patient on 10/21/2019, she was having 1+ pitting edema in bilateral lower extremity.  I stopped her hydrochlorothiazide and placed her on low-dose Lasix 10 mg daily.  She was seen by Dr. Alvester Chou in December 2020, diltiazem was stopped and her verapamil was increased.  Lasix was increased to 20 mg daily.  Renal function worsened after that, based on the last phone call, Lasix was decreased down to 10 mg daily again.  Patient presents today for cardiology office visit along with her daughter.  She continued to have lower extremity edema, I suspect some of which is related to venous insufficiency.  Her lower extremity edema is nonpitting in nature.  She denies any recent chest pain or dizziness.  She still has dyspnea on exertion however she is quite inactive at the independent living facility.  I suspect her baseline dyspnea on exertion is related to physical deconditioning.  She has doing exercise class, the most strenuous activity they do  over there are arm and leg stretching.  She is planning to get her COVID-19 vaccine very soon.  Otherwise I think she is quite stable from cardiology perspective and can follow-up in 6 months.  She will need a basic metabolic panel in the next 2 to 3 weeks given the recent acute on chronic renal insufficiency.  Otherwise I have instructed the patient to decrease Lasix to 10 mg daily.   Past Medical History:  Diagnosis Date   Arthritis    Depression    History of anemia    History of chicken pox    History of ulcer disease 1990s   HLD (hyperlipidemia)    HTN (hypertension)    Hypothyroidism    Osteoporosis 03/2016   at [place] - T -3.1 hip, -1.0 spine   Uses hearing aid     Past Surgical History:  Procedure Laterality Date   APPENDECTOMY  [place]   CARDIOVASCULAR STRESS TEST  2010   WNL   CARPAL TUNNEL RELEASE Left 04/28/2017   CATARACT EXTRACTION Bilateral 2012   DEXA  03/2016   at [place] - T -3.1 hip, -1.0 spine   GALLBLADDER SURGERY  1974   KNEE SURGERY  1987   right   KNEE SURGERY  1998   left   KNEE SURGERY  2008   TONSILLECTOMY  1945   US ECHOCARDIOGRAPHY  2010   LVEF 65-75%, overall normal, mildly decreased LV diastolic compliance  Current Medications: Current Meds  Medication Sig   acetaminophen (TYLENOL) 500 MG tablet Take 1,000 mg by mouth daily as needed.    cholecalciferol (VITAMIN D) 1000 UNITS tablet Take 1,000 Units by mouth daily.   denosumab (PROLIA) 60 MG/ML SOSY injection Inject 60 mg into the skin every 6 (six) months.   diphenhydramine-acetaminophen (TYLENOL PM) 25-500 MG TABS Take 2 tablets by mouth at bedtime as needed.   famotidine (PEPCID) 20 MG tablet Take 20 mg by mouth daily.   FLUoxetine (PROZAC) 20 MG tablet Take 2 tablets (40 mg total) by mouth daily.   furosemide (LASIX) 20 MG tablet Take 10 mg by mouth daily.   levothyroxine (SYNTHROID) 88 MCG tablet TAKE 1 TABLET BY MOUTH EVERY DAY BEFORE  BREAKFAST   lisinopril (ZESTRIL) 40 MG tablet TAKE 1 TABLET BY MOUTH TWICE DAILY   loratadine (CLARITIN) 10 MG tablet Take 10 mg by mouth daily.   mirabegron ER (MYRBETRIQ) 25 MG TB24 tablet Take 1 tablet (25 mg total) by mouth daily.   pravastatin (PRAVACHOL) 40 MG tablet TAKE 1 TABLET(40 MG) BY MOUTH DAILY   verapamil (CALAN-SR) 240 MG CR tablet Take 1 tablet (240 mg total) by mouth at bedtime.   vitamin B-12 (CYANOCOBALAMIN) 500 MCG tablet Take 1 tablet (500 mcg total) by mouth daily.     Allergies:   Patient has no known allergies.   Social History   Socioeconomic History   Marital status: Widowed    Spouse name: Not on file   Number of children: Not on file   Years of education: Not on file   Highest education level: Not on file  Occupational History   Not on file  Tobacco Use   Smoking status: Former Smoker    Quit date: 12/30/1992    Years since quitting: 27.0   Smokeless tobacco: Never Used  Substance and Sexual Activity   Alcohol use: No   Drug use: No   Sexual activity: Never  Other Topics Concern   Not on file  Social History Narrative   Widower since 04-17-99 - husband battled cancer for 6 yrs, died from MI   Daughter Baleria Wyman) passed away 04-17-2015   Lies in [place] [address].   Occupation: retired   HS: 12th grade   Activity: no regular exercise   Diet: good water, fruits/vegetables daily   Social Determinants of Radio broadcast assistant Strain:    Difficulty of Paying Living Expenses: Not on file  Food Insecurity:    Worried About Charity fundraiser in the Last Year: Not on file   [employer] of Food in the Last Year: Not on file  Transportation Needs:    Lack of Transportation (Medical): Not on file   Lack of Transportation (Non-Medical): Not on file  Physical Activity:    Days of Exercise per Week: Not on file   Minutes of Exercise per Session: Not on file  Stress:    Feeling of Stress : Not on file    Social Connections:    Frequency of Communication with Friends and Family: Not on file   Frequency of Social Gatherings with Friends and Family: Not on file   Attends Religious Services: Not on file   Active Member of Clubs or Organizations: Not on file   Attends Archivist Meetings: Not on file   Marital Status: Not on file     Family History: The patient's family history includes COPD in her sister;  Cancer in her father; Cancer (age of onset: 55) in her sister; Cancer (age of onset: 2) in her sister; Cancer (age of onset: 36) in her mother; Cancer (age of onset: 71) in her brother; Cancer (age of onset: 73) in her sister; Diabetes in her daughter; Hypertension in her daughter. There is no history of CAD or Stroke.  ROS:   Please see the history of present illness.     All other systems reviewed and are negative.  EKGs/Labs/Other Studies Reviewed:    The following studies were reviewed today:  Echo 09/10/2019 IMPRESSIONS  1. The left ventricle has normal systolic function with an ejection fraction of 60-65%. The cavity size was normal. There is severe asymmetric left ventricular hypertrophy of the basal septal wall. Left ventricular diastolic Doppler parameters are  consistent with impaired relaxation.  2. LVOT turbulence seen in systole without mitral valve SAM.  3. The right ventricle has mildly reduced systolic function. The cavity was small. There is no increase in right ventricular wall thickness. Right ventricular systolic pressure is normal.  4. Left atrial size was mildly dilated.  5. Mild thickening of the mitral valve leaflet. Mild calcification of the mitral valve leaflet. No evidence of mitral valve stenosis.  6. The aortic valve is tricuspid. Mild thickening of the aortic valve. Mild calcification of the aortic valve. Aortic valve regurgitation is mild by color flow Doppler.  7. The aorta is normal unless otherwise noted.  8. The aortic root, ascending  aorta and aortic arch are normal in size and structure.   EKG:  EKG is not ordered today.    Recent Labs: 08/23/2019: ALT 10; Hemoglobin 11.8; Platelets 230.0 09/01/2019: Pro B Natriuretic peptide (BNP) 95.0; TSH 1.08 01/10/2020: BUN 30; Creatinine, Ser 1.89; Potassium 4.0; Sodium 142  Recent Lipid Panel    Component Value Date/Time   CHOL 157 09/01/2019 1221   TRIG 143.0 09/01/2019 1221   HDL 54.20 09/01/2019 1221   CHOLHDL 3 09/01/2019 1221   VLDL 28.6 09/01/2019 1221   LDLCALC 75 09/01/2019 1221    Physical Exam:    VS:  BP 123/66    Pulse 86    Ht 5\' 4"  (1.626 m)    Wt 227 lb 9.6 oz (103.2 kg)    SpO2 100%    BMI 39.07 kg/m     Wt Readings from Last 3 Encounters:  01/17/20 227 lb 9.6 oz (103.2 kg)  12/17/19 220 lb (99.8 kg)  11/09/19 223 lb 1 oz (101.2 kg)     GEN:  Well nourished, well developed in no acute distress HEENT: Normal NECK: No JVD; No carotid bruits LYMPHATICS: No lymphadenopathy CARDIAC: RRR, no murmurs, rubs, gallops RESPIRATORY:  Clear to auscultation without rales, wheezing or rhonchi  ABDOMEN: Soft, non-tender, non-distended MUSCULOSKELETAL:  No edema; No deformity  SKIN: Warm and dry NEUROLOGIC:  Alert and oriented x 3 PSYCHIATRIC:  Normal affect   ASSESSMENT:    1. Leg swelling   2. Acute on chronic renal insufficiency   3. Essential hypertension   4. Mixed hyperlipidemia    PLAN:    In order of problems listed above:  1. Leg swelling: Given the recent worsening renal function, she has been instructed to decrease Lasix to 10 mg daily.  I suspect some of her leg edema is related to the venous insufficiency  2. Acute on chronic renal insufficiency: She did not get the instruction to decrease her Lasix, she is still on the 20 mg daily of Lasix.  I instructed her to decrease Lasix to 10 mg daily given the worsening renal function recently.  She will need a repeat basic metabolic panel in 2 to 3 weeks  3. Hypertension: Blood pressure  stable  4. Hyperlipidemia: On pravastatin   Medication Adjustments/Labs and Tests Ordered: Current medicines are reviewed at length with the patient today.  Concerns regarding medicines are outlined above.  Orders Placed This Encounter  Procedures   Basic metabolic panel   No orders of the defined types were placed in this encounter.   Patient Instructions  Medication Instructions:   REDUCE/DECREASE LASIX TO 10 MG DAILY *If you need a refill on your cardiac medications before your next appointment, please call your pharmacy*  Lab Work: Your physician recommends that you return for lab work in: 2-3 [place] If you have labs (blood work) drawn today and your tests are completely normal, you will receive your results only by:  [employer] (if you have MyChart) OR  A paper copy in the mail If you have any lab test that is abnormal or we need to change your treatment, we will call you to review the results.  Testing/Procedures: NONE ordered at this time of appointment   Follow-Up: At [facility], you and your health needs are our priority.  As part of our continuing mission to provide you with exceptional heart care, we have created designated Provider Care Teams.  These Care Teams include your primary Cardiologist (physician) and Advanced Practice Providers (APPs -  Physician Assistants and Nurse Practitioners) who all work together to provide you with the care you need, when you need it.  Your next appointment:   6 month(s)  The format for your next appointment:   In Person  Provider:   Quay Burow, MD  Other Instructions      Signed, Almyra Deforest, [place]  01/19/2020 11:45 PM    [place]

## 2020-01-17 NOTE — Patient Instructions (Signed)
Medication Instructions:   REDUCE/DECREASE LASIX TO 10 MG DAILY *If you need a refill on your cardiac medications before your next appointment, please call your pharmacy*  Lab Work: Your physician recommends that you return for lab work in: 2-3 [place] If you have labs (blood work) drawn today and your tests are completely normal, you will receive your results only by: Marland Kitchen MyChart Message (if you have MyChart) OR . A paper copy in the mail If you have any lab test that is abnormal or we need to change your treatment, we will call you to review the results.  Testing/Procedures: NONE ordered at this time of appointment   Follow-Up: At [facility], you and your health needs are our priority.  As part of our continuing mission to provide you with exceptional heart care, we have created designated Provider Care Teams.  These Care Teams include your primary Cardiologist (physician) and Advanced Practice Providers (APPs -  Physician Assistants and Nurse Practitioners) who all work together to provide you with the care you need, when you need it.  Your next appointment:   6 month(s)  The format for your next appointment:   In Person  Provider:   Quay Burow, MD  Other Instructions

## 2020-01-19 ENCOUNTER — Encounter: Payer: Self-pay | Admitting: Physician Assistant

## 2020-01-24 ENCOUNTER — Encounter: Payer: Self-pay | Admitting: Family Medicine

## 2020-01-29 ENCOUNTER — Other Ambulatory Visit: Payer: Self-pay

## 2020-01-29 DIAGNOSIS — N189 Chronic kidney disease, unspecified: Secondary | ICD-10-CM

## 2020-01-29 DIAGNOSIS — M7989 Other specified soft tissue disorders: Secondary | ICD-10-CM

## 2020-02-02 ENCOUNTER — Ambulatory Visit: Payer: Medicare Other | Admitting: Physician Assistant

## 2020-02-03 ENCOUNTER — Ambulatory Visit: Payer: Medicare Other | Admitting: Physician Assistant

## 2020-02-23 ENCOUNTER — Telehealth: Payer: Self-pay | Admitting: Family Medicine

## 2020-02-23 MED ORDER — FLUOXETINE HCL 40 MG PO CAPS: 40.0000 mg | ORAL_CAPSULE | Freq: Every day | ORAL | 3 refills | Status: DC

## 2020-02-23 NOTE — Telephone Encounter (Signed)
Spoke with pt relaying Dr. G's message.  Pt verbalizes understanding and expresses her thanks.  

## 2020-02-23 NOTE — Telephone Encounter (Signed)
Received message from insurance - fluoxetine tablets are not covered - request change to fluoxetine capsules. This was sent in. (changed from 20mg  tablets 2 a day to 40mg  capsule one a day) Plz notify pt.

## 2020-02-25 DIAGNOSIS — M7989 Other specified soft tissue disorders: Secondary | ICD-10-CM | POA: Diagnosis not present

## 2020-02-25 DIAGNOSIS — N289 Disorder of kidney and ureter, unspecified: Secondary | ICD-10-CM | POA: Diagnosis not present

## 2020-02-25 DIAGNOSIS — N189 Chronic kidney disease, unspecified: Secondary | ICD-10-CM | POA: Diagnosis not present

## 2020-02-26 LAB — BASIC METABOLIC PANEL
BUN/Creatinine Ratio: 18 (ref 12–28)
BUN: 28 mg/dL — ABNORMAL HIGH (ref 8–27)
CO2: 21 mmol/L (ref 20–29)
Calcium: 9.6 mg/dL (ref 8.7–10.3)
Chloride: 102 mmol/L (ref 96–106)
Creatinine, Ser: 1.54 mg/dL — ABNORMAL HIGH (ref 0.57–1.00)
GFR calc Af Amer: 35 mL/min/{1.73_m2} — ABNORMAL LOW (ref >59)
GFR calc non Af Amer: 30 mL/min/{1.73_m2} — ABNORMAL LOW (ref >59)
Glucose: 105 mg/dL — ABNORMAL HIGH (ref 65–99)
Potassium: 4.6 mmol/L (ref 3.5–5.2)
Sodium: 138 mmol/L (ref 134–144)

## 2020-02-29 ENCOUNTER — Telehealth: Payer: Self-pay

## 2020-02-29 ENCOUNTER — Ambulatory Visit: Payer: Medicare Other | Admitting: Family Medicine

## 2020-02-29 NOTE — Telephone Encounter (Signed)
Discussed Prolia benefits w/pt.  Pt would owe approx. $260.  Pt understands and agrees.

## 2020-02-29 NOTE — Telephone Encounter (Signed)
Pt had 2d Covid vaccine yesterday, 02-28-20.  How long after last Covid vaccine can pt have Prolia?  She is due for next Prolia injection 03-16-20 and is scheduled for 03-16-20.

## 2020-03-02 NOTE — Telephone Encounter (Signed)
That should be ok.  Would want to wait at least 2 wks.

## 2020-03-03 ENCOUNTER — Other Ambulatory Visit: Payer: Self-pay

## 2020-03-03 ENCOUNTER — Encounter: Payer: Self-pay | Admitting: Family Medicine

## 2020-03-03 ENCOUNTER — Ambulatory Visit (INDEPENDENT_AMBULATORY_CARE_PROVIDER_SITE_OTHER): Payer: Medicare HMO | Admitting: Family Medicine

## 2020-03-03 ENCOUNTER — Other Ambulatory Visit: Payer: Self-pay | Admitting: Family Medicine

## 2020-03-03 VITALS — BP 130/68 | HR 80 | Temp 97.8°F | Ht 64.0 in | Wt 230.4 lb

## 2020-03-03 DIAGNOSIS — F331 Major depressive disorder, recurrent, moderate: Secondary | ICD-10-CM | POA: Diagnosis not present

## 2020-03-03 DIAGNOSIS — M810 Age-related osteoporosis without current pathological fracture: Secondary | ICD-10-CM | POA: Diagnosis not present

## 2020-03-03 DIAGNOSIS — R0609 Other forms of dyspnea: Secondary | ICD-10-CM

## 2020-03-03 DIAGNOSIS — E039 Hypothyroidism, unspecified: Secondary | ICD-10-CM

## 2020-03-03 DIAGNOSIS — N1832 Chronic kidney disease, stage 3b: Secondary | ICD-10-CM

## 2020-03-03 DIAGNOSIS — R0600 Dyspnea, unspecified: Secondary | ICD-10-CM | POA: Diagnosis not present

## 2020-03-03 DIAGNOSIS — D649 Anemia, unspecified: Secondary | ICD-10-CM | POA: Diagnosis not present

## 2020-03-03 NOTE — Patient Instructions (Signed)
Labs today Continue current medicines.  we will be in touch with results.

## 2020-03-03 NOTE — Progress Notes (Signed)
This visit was conducted in person.  BP 130/68 (BP Location: Left Arm, Patient Position: Sitting, Cuff Size: Large)   Pulse 80   Temp 97.8 F (36.6 C) (Temporal)   Ht 5\' 4"  (1.626 m)   Wt 230 lb 7 oz (104.5 kg)   SpO2 98%   BMI 39.55 kg/m   BP Readings from Last 3 Encounters:  03/03/20 130/68  01/17/20 123/66  12/17/19 (!) 146/68    CC: 6 mo f/u visit Subjective:    Patient ID: [name], female    DOB: 10/05/1930, 86 y.o.   MRN: [id number]  HPI: [name] is a 86 y.o. female presenting on 03/03/2020 for Follow-up (Here for 6 mo f/u.  Pt accompanied by, [name]- temp 97.8.)    Just completed moderna covid shot.  Saw cardiology 12/2019, lasix decreased to 10mg  daily due to renal insufficiency.   Due for prolia - awaiting 2 wks after covid vaccine series.   MDD - last visit we increased prozac to 40mg  daily. Mood is doing some better. She is also happy that bingo has restarted twice weekly. She has also restarted exercise classes twice a week. Daughter feels all this has helped.   Ongoing dyspnea - feels this may be worsening. Denies dizziness or presyncope. Denies PNDyspnea. Endorses restorative sleep but notes some daytime somnolence. Both children have OSA on CPAP. She has previously passed ambulatory pulse ox.      Relevant past medical, surgical, family and social history reviewed and updated as indicated. Interim medical history since our last visit reviewed. Allergies and medications reviewed and updated. Outpatient Medications Prior to Visit  Medication Sig Dispense Refill  . acetaminophen (TYLENOL) 500 MG tablet Take 1,000 mg by mouth daily as needed.     . cholecalciferol (VITAMIN D) 1000 UNITS tablet Take 1,000 Units by mouth daily.    Marland Kitchen denosumab (PROLIA) 60 MG/ML SOSY injection Inject 60 mg into the skin every 6 (six) months.    . diphenhydramine-acetaminophen (TYLENOL PM) 25-500 MG TABS Take 2 tablets by mouth at bedtime as needed.    . famotidine (PEPCID)  20 MG tablet Take 20 mg by mouth daily.    Marland Kitchen FLUoxetine (PROZAC) 40 MG capsule Take 1 capsule (40 mg total) by mouth daily. 90 capsule 3  . furosemide (LASIX) 20 MG tablet Take 10 mg by mouth daily.    Marland Kitchen levothyroxine (SYNTHROID) 88 MCG tablet TAKE 1 TABLET BY MOUTH EVERY DAY BEFORE BREAKFAST 90 tablet 2  . lisinopril (ZESTRIL) 40 MG tablet TAKE 1 TABLET BY MOUTH TWICE DAILY 180 tablet 2  . loratadine (CLARITIN) 10 MG tablet Take 10 mg by mouth daily.    . mirabegron ER (MYRBETRIQ) 25 MG TB24 tablet Take 1 tablet (25 mg total) by mouth daily. 30 tablet 3  . pravastatin (PRAVACHOL) 40 MG tablet TAKE 1 TABLET BY MOUTH EVERY DAY 90 tablet 2  . verapamil (CALAN-SR) 240 MG CR tablet Take 1 tablet (240 mg total) by mouth at bedtime. 90 tablet 1  . vitamin B-12 (CYANOCOBALAMIN) 500 MCG tablet Take 1 tablet (500 mcg total) by mouth daily.     No facility-administered medications prior to visit.     Per HPI unless specifically indicated in ROS section below Review of Systems Objective:    BP 130/68 (BP Location: Left Arm, Patient Position: Sitting, Cuff Size: Large)   Pulse 80   Temp 97.8 F (36.6 C) (Temporal)   Ht 5\' 4"  (1.626 m)   Wt  230 lb 7 oz (104.5 kg)   SpO2 98%   BMI 39.55 kg/m   Wt Readings from Last 3 Encounters:  03/03/20 230 lb 7 oz (104.5 kg)  01/17/20 227 lb 9.6 oz (103.2 kg)  12/17/19 220 lb (99.8 kg)    Physical Exam Vitals and nursing note reviewed.  Constitutional:      Appearance: Normal appearance. She is obese. She is not ill-appearing.  Eyes:     Extraocular Movements: Extraocular movements intact.     Pupils: Pupils are equal, round, and reactive to light.     Comments: Palpebral conjunctival pallor  Cardiovascular:     Rate and Rhythm: Normal rate and regular rhythm.     Pulses: Normal pulses.     Heart sounds: Normal heart sounds. No murmur.  Pulmonary:     Effort: Pulmonary effort is normal. No respiratory distress.     Breath sounds: Normal breath  sounds. No wheezing, rhonchi or rales.  Musculoskeletal:     Right lower leg: No edema.     Left lower leg: No edema.  Skin:    Coloration: Skin is pale.  Neurological:     Mental Status: She is alert.  Psychiatric:        Mood and Affect: Mood normal.        Behavior: Behavior normal.       Results for orders placed or performed in visit on 03/03/20  TSH  Result Value Ref Range   TSH 2.00 0.40 - 4.50 mIU/L  CBC with Differential/Platelet  Result Value Ref Range   WBC 6.3 3.8 - 10.8 Thousand/uL   RBC 2.75 (L) 3.80 - 5.10 Million/uL   Hemoglobin 7.0 (L) 11.7 - 15.5 g/dL   HCT 23.0 (L) 35.0 - 45.0 %   MCV 83.6 80.0 - 100.0 fL   MCH 25.5 (L) 27.0 - 33.0 pg   MCHC 30.4 (L) 32.0 - 36.0 g/dL   RDW 12.4 11.0 - 15.0 %   Platelets 279 140 - 400 Thousand/uL   MPV 9.3 7.5 - 12.5 fL   Neutro Abs 3,912 1,500 - 7,800 cells/uL   Lymphs Abs 1,008 850 - 3,900 cells/uL   Absolute Monocytes 857 200 - 950 cells/uL   Eosinophils Absolute 473 15 - 500 cells/uL   Basophils Absolute 50 0 - 200 cells/uL   Neutrophils Relative % 62.1 %   Total Lymphocyte 16.0 %   Monocytes Relative 13.6 %   Eosinophils Relative 7.5 %   Basophils Relative 0.8 %  Renal function panel  Result Value Ref Range   Glucose, Bld 102 (H) 65 - 99 mg/dL   BUN 29 (H) 7 - 25 mg/dL   Creat 1.65 (H) 0.60 - 0.88 mg/dL   BUN/Creatinine Ratio 18 6 - 22 (calc)   Sodium 139 135 - 146 mmol/L   Potassium 4.5 3.5 - 5.3 mmol/L   Chloride 106 98 - 110 mmol/L   CO2 22 20 - 32 mmol/L   Calcium 9.7 8.6 - 10.4 mg/dL   Phosphorus 3.7 2.1 - 4.3 mg/dL   Albumin 3.5 (L) 3.6 - 5.1 g/dL  CBC MORPHOLOGY  Result Value Ref Range   CBC MORPHOLOGY  NORMAL   Depression screen [place] 2/9 03/03/2020 11/09/2019 09/01/2019 03/31/2018 03/19/2017  Decreased Interest 1 2 0 0 0  Down, Depressed, Hopeless 2 3 0 0 0  PHQ - 2 Score 3 5 0 0 0  Altered sleeping 3 0 - 0 -  Tired, decreased energy 2 3 -  0 -  Change in appetite 0 3 - 0 -  Feeling bad or failure  about yourself  0 3 - 0 -  Trouble concentrating 1 1 - 0 -  Moving slowly or fidgety/restless 1 0 - 0 -  Suicidal thoughts 0 0 - 0 -  PHQ-9 Score 10 15 - 0 -  Difficult doing work/chores - - - Not difficult at all -    GAD 7 : Generalized Anxiety Score 03/03/2020 11/09/2019  Nervous, Anxious, on Edge 2 3  Control/stop worrying 3 3  Worry too much - different things 2 3  Trouble relaxing 1 1  Restless 1 2  Easily annoyed or irritable 1 0  Afraid - awful might happen 1 0  Total GAD 7 Score 11 12   Assessment & Plan:  This visit occurred during the SARS-CoV-2 public health emergency.  Safety protocols were in place, including screening questions prior to the visit, additional usage of staff PPE, and extensive cleaning of exam room while observing appropriate contact time as indicated for disinfecting solutions.   Problem List Items Addressed This Visit    Osteoporosis    prolia planned 2 wks after latest covid shot.       MDD (major depressive disorder), recurrent episode, moderate (HCC)    Improvement noted based on PHQ score. Anticipate improvement in activity level and social engagement has also helped. Continue current prozac dose.       Hypothyroidism    Update TSH      Relevant Orders   TSH (Completed)   Exertional dyspnea    May be worsening. Check CBC with pallor noted r/o anemia.  If unrevealing, consider pulm eval.       CKD (chronic kidney disease) stage 3, GFR 30-59 ml/min (HCC) - Primary    Deteriorated with higher lasix dose now only on 10mg  daily.       Relevant Orders   CBC with Differential/Platelet (Completed)   Renal function panel (Completed)   Anemia    Newly noted anemia likely responsible for progressive exertional dyspnea. Last Hgb 11.8 (07/2019).  Will have her return for anemia panel.  Will send for 1u pRBC transfusion.  In h/o ulcer disease, will start PPI and refer to GI.       Relevant Orders   Vitamin B12   Ferritin   IBC panel    Folate   Lactate dehydrogenase       No orders of the defined types were placed in this encounter.  Orders Placed This Encounter  Procedures  . TSH  . CBC with Differential/Platelet  . Renal function panel  . CBC MORPHOLOGY  . Vitamin B12    Standing Status:   Future    Standing Expiration Date:   03/04/2021  . Ferritin    Standing Status:   Future    Standing Expiration Date:   03/04/2021  . IBC panel    Standing Status:   Future    Standing Expiration Date:   03/04/2021  . Folate    Standing Status:   Future    Standing Expiration Date:   03/04/2021  . Lactate dehydrogenase    Standing Status:   Future    Standing Expiration Date:   03/04/2021   Patient Instructions  Labs today Continue current medicines.  we will be in touch with results.    Follow up plan: Return if symptoms worsen or fail to improve.  [name], MD

## 2020-03-04 ENCOUNTER — Other Ambulatory Visit: Payer: Self-pay | Admitting: Family Medicine

## 2020-03-04 DIAGNOSIS — D649 Anemia, unspecified: Secondary | ICD-10-CM

## 2020-03-04 LAB — RENAL FUNCTION PANEL
Albumin: 3.5 g/dL — ABNORMAL LOW (ref 3.6–5.1)
BUN/Creatinine Ratio: 18 (calc) (ref 6–22)
BUN: 29 mg/dL — ABNORMAL HIGH (ref 7–25)
CO2: 22 mmol/L (ref 20–32)
Calcium: 9.7 mg/dL (ref 8.6–10.4)
Chloride: 106 mmol/L (ref 98–110)
Creat: 1.65 mg/dL — ABNORMAL HIGH (ref 0.60–0.88)
Glucose, Bld: 102 mg/dL — ABNORMAL HIGH (ref 65–99)
Phosphorus: 3.7 mg/dL (ref 2.1–4.3)
Potassium: 4.5 mmol/L (ref 3.5–5.3)
Sodium: 139 mmol/L (ref 135–146)

## 2020-03-04 LAB — CBC WITH DIFFERENTIAL/PLATELET
Absolute Monocytes: 857 cells/uL (ref 200–950)
Basophils Absolute: 50 cells/uL (ref 0–200)
Basophils Relative: 0.8 %
Eosinophils Absolute: 473 cells/uL (ref 15–500)
Eosinophils Relative: 7.5 %
HCT: 23 % — ABNORMAL LOW (ref 35.0–45.0)
Hemoglobin: 7 g/dL — ABNORMAL LOW (ref 11.7–15.5)
Lymphs Abs: 1008 cells/uL (ref 850–3900)
MCH: 25.5 pg — ABNORMAL LOW (ref 27.0–33.0)
MCHC: 30.4 g/dL — ABNORMAL LOW (ref 32.0–36.0)
MCV: 83.6 fL (ref 80.0–100.0)
MPV: 9.3 fL (ref 7.5–12.5)
Monocytes Relative: 13.6 %
Neutro Abs: 3912 cells/uL (ref 1500–7800)
Neutrophils Relative %: 62.1 %
Platelets: 279 10*3/uL (ref 140–400)
RBC: 2.75 10*6/uL — ABNORMAL LOW (ref 3.80–5.10)
RDW: 12.4 % (ref 11.0–15.0)
Total Lymphocyte: 16 %
WBC: 6.3 10*3/uL (ref 3.8–10.8)

## 2020-03-04 LAB — TSH: TSH: 2 mIU/L (ref 0.40–4.50)

## 2020-03-04 LAB — CBC MORPHOLOGY

## 2020-03-04 MED ORDER — OMEPRAZOLE 40 MG PO CPDR: 40.0000 mg | DELAYED_RELEASE_CAPSULE | Freq: Every day | ORAL | 3 refills | Status: DC

## 2020-03-04 NOTE — Assessment & Plan Note (Signed)
Deteriorated with higher lasix dose now only on 10mg  daily.

## 2020-03-04 NOTE — Assessment & Plan Note (Addendum)
Newly noted anemia likely responsible for progressive exertional dyspnea. Last Hgb 11.8 (07/2019).  Will have her return for anemia panel.  Will send for 1u pRBC transfusion.  In h/o ulcer disease, will start PPI and refer to GI.

## 2020-03-04 NOTE — Assessment & Plan Note (Signed)
Improvement noted based on PHQ score. Anticipate improvement in activity level and social engagement has also helped. Continue current prozac dose.

## 2020-03-04 NOTE — Assessment & Plan Note (Addendum)
May be worsening. Check CBC with pallor noted r/o anemia.  If unrevealing, consider pulm eval.

## 2020-03-04 NOTE — Assessment & Plan Note (Signed)
prolia planned 2 wks after latest covid shot.

## 2020-03-04 NOTE — Assessment & Plan Note (Signed)
Update TSH

## 2020-03-06 ENCOUNTER — Other Ambulatory Visit (INDEPENDENT_AMBULATORY_CARE_PROVIDER_SITE_OTHER): Payer: Medicare HMO

## 2020-03-06 ENCOUNTER — Telehealth: Payer: Self-pay

## 2020-03-06 ENCOUNTER — Telehealth: Payer: Self-pay | Admitting: Radiology

## 2020-03-06 ENCOUNTER — Other Ambulatory Visit: Payer: Self-pay

## 2020-03-06 DIAGNOSIS — D649 Anemia, unspecified: Secondary | ICD-10-CM | POA: Diagnosis not present

## 2020-03-06 LAB — CBC WITH DIFFERENTIAL/PLATELET
Basophils Absolute: 0.1 10*3/uL (ref 0.0–0.1)
Basophils Relative: 1.8 % (ref 0.0–3.0)
Eosinophils Absolute: 0.3 10*3/uL (ref 0.0–0.7)
Eosinophils Relative: 5 % (ref 0.0–5.0)
HCT: 20.9 % — CL (ref 36.0–46.0)
Hemoglobin: 6.7 g/dL — CL (ref 12.0–15.0)
Lymphocytes Relative: 16.2 % (ref 12.0–46.0)
Lymphs Abs: 1 10*3/uL (ref 0.7–4.0)
MCHC: 32.3 g/dL (ref 30.0–36.0)
MCV: 80.8 fl (ref 78.0–100.0)
Monocytes Absolute: 0.7 10*3/uL (ref 0.1–1.0)
Monocytes Relative: 11.8 % (ref 3.0–12.0)
Neutro Abs: 3.9 10*3/uL (ref 1.4–7.7)
Neutrophils Relative %: 65.2 % (ref 43.0–77.0)
Platelets: 262 10*3/uL (ref 150.0–400.0)
RBC: 2.59 Mil/uL — ABNORMAL LOW (ref 3.87–5.11)
RDW: 13.6 % (ref 11.5–15.5)
WBC: 6 10*3/uL (ref 4.0–10.5)

## 2020-03-06 LAB — FERRITIN: Ferritin: 8.3 ng/mL — ABNORMAL LOW (ref 10.0–291.0)

## 2020-03-06 LAB — IBC PANEL
Iron: 24 ug/dL — ABNORMAL LOW (ref 42–145)
Saturation Ratios: 5.5 % — ABNORMAL LOW (ref 20.0–50.0)
Transferrin: 313 mg/dL (ref 212.0–360.0)

## 2020-03-06 LAB — FOLATE: Folate: 20.2 ng/mL (ref >5.9)

## 2020-03-06 LAB — VITAMIN B12: Vitamin B-12: 1446 pg/mL — ABNORMAL HIGH (ref 211–911)

## 2020-03-06 NOTE — Telephone Encounter (Signed)
Faxed order to [facility] Short Stay at [phone].    Spoke pt giving her phn # [phone] to call and schedule infusion at her convenience.  Says she will have her daughter call so she can make sure she has a ride.

## 2020-03-06 NOTE — Telephone Encounter (Signed)
[place] Night - Client TELEPHONE ADVICE RECORD [facility] Patient Name: [name] Gender: Female DOB: [birth year] Age: 86 Y 25 M 27 D Return Phone Number: [phone] (Primary), [phone] (Secondary) Address: City/State/Zip: [place] Night - Client Client Site [place] Physician [name] - MD Contact Type Call Who Is Calling Lab Lab Name [facility] Lab Phone Number [phone] Lab Tech Name [place] Lab Reference Number [id number] M Chief Complaint Lab Result (Critical or Stat) Call Type Lab Send to RN Reason for Call Report lab results Initial Comment Ameer calling from [place] with urgent lab results Additional Comment Translation No Nurse Assessment Nurse: Tressia Danas, RN, Earnest Bailey Date/Time (Eastern Time): 03/04/2020 2:08:00 PM Is there an on-call provider listed? ---Yes Please list name of person reporting value (Lab Employee) and a contact number. ---Ameer [phone] [facility] Please document the following items: Lab name Lab value (read back to lab to verify) Reference range for lab value Date and time blood was drawn Collect time of birth for bilirubin results ---Hgb 7.0 verified specimen obtained 03/03/2020 @ 1620 no previous results Please collect the patient contact information from the lab. (name, phone number and address) ---[phone] Nurse: Tressia Danas, RN, Earnest Bailey Date/Time (Eastern Time): 03/04/2020 3:13:41 PM Confirm and document reason for call. If symptomatic, describe symptoms. ---caller states she feels short of breath. caller states her O2 sat is 99%. denies any bleeding. caller also having trouble sleeping. Has the patient had close contact with a person known or suspected to have the novel coronavirus illness OR traveled / lives in area with major community spread (including international travel) in the last 14 days from the onset of symptoms? * If  Asymptomatic, screen for exposure and travel within the last 14 days. ---No Does the patient have any new or worsening symptoms? ---Yes Will a triage be completed? ---Yes Related visit to physician within the last 2 weeks? ---Yes Does the PT have any chronic conditions? (i.e. diabetes, asthma, this includes High risk factors for pregnancy, etc.) ---No PLEASE NOTE: All timestamps contained within this report are represented as [place] Standard Time. CONFIDENTIALTY NOTICE: This fax transmission is intended only for the addressee. It contains information that is legally privileged, confidential or otherwise protected from use or disclosure. If you are not the intended recipient, you are strictly prohibited from reviewing, disclosing, copying using or disseminating any of this information or taking any action in reliance on or regarding this information. If you have received this fax in error, please notify us immediately by telephone so that we can arrange for its return to [place]. Phone: [phone], Toll-Free: [phone], Fax: [phone] Page: 2 of 3 Call Id: [id number] Nurse Assessment Is this a behavioral health or substance abuse call? ---No Guidelines Guideline Title Affirmed Question Affirmed Notes Nurse Date/Time Eilene Ghazi Time) Breathing Difficulty [1] MODERATE longstanding difficulty breathing (e.g., speaks in phrases, SOB even at rest, pulse 100-120) AND [2] SAME as normal McClarnon, RN, [facility] 03/04/2020 3:17:08 PM Disp. Time Eilene Ghazi Time) Disposition Final User 03/04/2020 2:12:42 PM Paged On Call back to [facility], [place], [facility] 03/04/2020 2:13:29 PM Lab Call Tressia Danas, RN, Earnest Bailey Reason: HGB 7.0 verified collected 03/03/20@1620  03/04/2020 2:13:36 PM Clinical Call Tressia Danas, [place], [facility] 03/04/2020 2:14:23 PM Attempt made - line busy [place], [place], [facility] 03/04/2020 2:29:08 PM Paged On Call back to [facility], [place], [facility] 03/04/2020 3:09:48 PM Called On-Call Provider [place],  [place], [facility] 03/04/2020 3:18:39 PM SEE PCP WITHIN 3 DAYS Yes  McClarnon, RN, Catering manager Understands Yes PreDisposition Did not know what to do Care Advice Given Per Guideline SEE PCP WITHIN 3 DAYS: CARE ADVICE given per Breathing Difficulty (Adult) guideline. * You become worse CALL BACK IF: * Severe difficulty breathing occurs Comments User: Dennard Nip, RN Date/Time (Eastern Time): 03/04/2020 3:25:07 PM Caller states she feels fine right now. she gets short of breath when she is up and moving a lot that is better when she rests. instructed if she feels faint or the trouble breathing gets worse she needs to go to the ED. if she stays the same she will need to follow up with her Dr on Monday. caller states understanding Paging DoctorName Phone DateTime Result/Outcome Message Type Notes Shanon Ace - MD [phone] 03/04/2020 2:12:42 PM Called On Call Provider - Left Message Doctor Paged Shanon Ace - MD [phone] 03/04/2020 2:29:08 PM Called On Call Provider - Left Message Doctor Paged PLEASE NOTE: All timestamps contained within this report are represented as [place] Standard Time. CONFIDENTIALTY NOTICE: This fax transmission is intended only for the addressee. It contains information that is legally privileged, confidential or otherwise protected from use or disclosure. If you are not the intended recipient, you are strictly prohibited from reviewing, disclosing, copying using or disseminating any of this information or taking any action in reliance on or regarding this information. If you have received this fax in error, please notify us immediately by telephone so that we can arrange for its return to [place]. Phone: [phone], Toll-Free: [phone], Fax: [phone] Page: 3 of 3 Call Id: [id number] Paging DoctorName Phone DateTime Result/Outcome Message Type Notes Shanon Ace - MD [phone] 03/04/2020 3:09:48 PM Called On Call Provider -  Reached Doctor Paged Shanon Ace - MD 03/04/2020 3:11:03 PM Spoke with On Call - General Message Result reported critical lab value to MD. MD requests to call the pt see how she is feeling, if she is lightheaded and feels like she is going to pass out or actively bleeding send to ED if not follow up with MD on Monday

## 2020-03-06 NOTE — Telephone Encounter (Signed)
Brittany Bush, MD  03/04/2020 9:33 AM EST    Plz call pt/daughter - thyroid ok, kidneys remain impaired but stable.  She has new marked anemia causing her shortness of breath - please set her up for a 1 unit blood transfusion given symptomatic. I would like her to return prior to transfusion for further labwork and we may be referring her to hematology. Any new blood in stool or urine? I also want her to do iFOB and start omeprazole 40mg  daily. Sent to pharmacy.    Lab result note above from Dr. Darnell Level. Started paperwork for infusion and placed in Dr. Darnell Level box for completion and signature.   Pt's daughter, [name], reports pt reported she has slept very well over the last 2 nights. She thinks she doing better concerning her SOB but she has not seen the pt today.  Pt is scheduled today for labs. Advised of results and new medication.[name] verbalized understanding.

## 2020-03-06 NOTE — Telephone Encounter (Signed)
[facility] lab called critical results, HGB - 6.7, HCT - 20.9. Results given to Dr Danise Mina

## 2020-03-07 ENCOUNTER — Other Ambulatory Visit (HOSPITAL_COMMUNITY): Payer: Self-pay

## 2020-03-07 LAB — LACTATE DEHYDROGENASE: LDH: 169 U/L (ref 120–250)

## 2020-03-07 LAB — PATHOLOGIST SMEAR REVIEW

## 2020-03-07 LAB — SPECIMEN COMPROMISED

## 2020-03-07 NOTE — Telephone Encounter (Signed)
Received call from [facility] stating pt's are no longer able to schedule infusion appts.  However, she did get pt scheduled on 03/08/20 at 8:00 for blood transfusion.  Pt will need to pull up to valet area at [facility], [place].  She will be taken from the car to the clinic and no family is allowed to accompany her.   Spoke with pt relaying appt info, including date, time, location and no family.  Verbalizes understanding.

## 2020-03-08 ENCOUNTER — Other Ambulatory Visit: Payer: Medicare HMO

## 2020-03-08 ENCOUNTER — Other Ambulatory Visit (INDEPENDENT_AMBULATORY_CARE_PROVIDER_SITE_OTHER): Payer: Medicare HMO

## 2020-03-08 ENCOUNTER — Telehealth: Payer: Self-pay | Admitting: Radiology

## 2020-03-08 ENCOUNTER — Other Ambulatory Visit: Payer: Self-pay

## 2020-03-08 ENCOUNTER — Other Ambulatory Visit: Payer: Self-pay | Admitting: Family Medicine

## 2020-03-08 ENCOUNTER — Telehealth: Payer: Self-pay

## 2020-03-08 ENCOUNTER — Ambulatory Visit (HOSPITAL_COMMUNITY)
Admission: RE | Admit: 2020-03-08 | Discharge: 2020-03-08 | Disposition: A | Discharge status: Home / Self Care | Payer: Medicare HMO | Source: Ambulatory Visit | Attending: Family Medicine | Admitting: Family Medicine

## 2020-03-08 DIAGNOSIS — D649 Anemia, unspecified: Secondary | ICD-10-CM

## 2020-03-08 DIAGNOSIS — D509 Iron deficiency anemia, unspecified: Secondary | ICD-10-CM

## 2020-03-08 DIAGNOSIS — D500 Iron deficiency anemia secondary to blood loss (chronic): Secondary | ICD-10-CM

## 2020-03-08 LAB — ABO/RH: ABO/RH(D): A POS

## 2020-03-08 LAB — PREPARE RBC (CROSSMATCH)

## 2020-03-08 LAB — FECAL OCCULT BLOOD, IMMUNOCHEMICAL: Fecal Occult Bld: POSITIVE — AB

## 2020-03-08 MED ORDER — VITAMIN B-12 500 MCG PO TABS: 500.0000 ug | ORAL_TABLET | ORAL | Status: AC

## 2020-03-08 MED ORDER — FERROUS SULFATE 325 (65 FE) MG PO TABS: 325.0000 mg | ORAL_TABLET | Freq: Every day | ORAL | Status: DC

## 2020-03-08 MED ORDER — SODIUM CHLORIDE 0.9% IV SOLUTION: Freq: Once | INTRAVENOUS | Status: DC

## 2020-03-08 NOTE — Telephone Encounter (Signed)
Pt is having blood transfusion today. Pt is also scheduled for Prolia injection on 3/18. Last labs, 03/03/20, Ca 9.7, serum creatinine 1.65 with a creatinine clearance of 28.3 Estimated Creatinine Clearance: 28.3 mL/min (A) (by C-G formula based on SCr of 1.65 mg/dL (H)).  Routing msg to provider to assure pt should continue with prolia injection.

## 2020-03-08 NOTE — Telephone Encounter (Signed)
[facility] called a critical result, POSITIVE ifob. Results given to Dr Danise Mina

## 2020-03-08 NOTE — Telephone Encounter (Addendum)
Noted. Will refer urgently to GI.  See lab result note.  How is she feeling after 1 unit blood transfusion?

## 2020-03-08 NOTE — Telephone Encounter (Signed)
Let's hold prolia at this time.

## 2020-03-08 NOTE — Telephone Encounter (Signed)
Please review for Prolia injection scheduled for 03-16-20.  Pt scheduled for blood transfusion 03-08-20. Abnl renal function lab results 03-03-20.  Benefits already discussed previously w/pt.

## 2020-03-08 NOTE — Telephone Encounter (Signed)
Noted. Awaiting other labs. Had blood transfusion scheduled for today.  See result note.

## 2020-03-09 ENCOUNTER — Encounter: Payer: Self-pay | Admitting: Gastroenterology

## 2020-03-09 LAB — TYPE AND SCREEN
ABO/RH(D): A POS
Antibody Screen: NEGATIVE
Unit division: 0

## 2020-03-09 LAB — BPAM RBC
Blood Product Expiration Date: 202104102359
ISSUE DATE / TIME: 202103100956
Unit Type and Rh: 6200

## 2020-03-09 NOTE — Telephone Encounter (Signed)
Thank you :)

## 2020-03-09 NOTE — Addendum Note (Signed)
Addended by: [name] on: 03/09/2020 03:10 PM   Modules accepted: Orders

## 2020-03-09 NOTE — Telephone Encounter (Signed)
Advised pt's daughter, [name] and made apt for lab for Monday. Advised of when to go to ER. Also advised to come in front for labs. [name] verbalized understanding.

## 2020-03-09 NOTE — Telephone Encounter (Signed)
Contacted pt and advised. She asked that this nurse call her daughter. Contacted [name] and advised. [name] would like to know if pt needs to come back in for hemoglobin check.  Advised a msg would be sent to Dr. Darnell Level and this office would f/u. [name] verbalized understanding.

## 2020-03-09 NOTE — Telephone Encounter (Signed)
Spoke with patients daughter [name], patientsays she is breathing better and feeling better. [facility] Appt is Thursday March 18th with Alonza Bogus. I have asked them to please call her if anyone cancels

## 2020-03-09 NOTE — Telephone Encounter (Signed)
GI appt scheduled next week. Yes - please schedule lab visit for Monday recheck Hgb. Ordered.  If acute worsening shortness of breath or dizziness, or any chest pain, passing out, or blood in stool, go to ER.

## 2020-03-12 ENCOUNTER — Emergency Department (HOSPITAL_COMMUNITY): Payer: Medicare HMO

## 2020-03-12 ENCOUNTER — Encounter (HOSPITAL_COMMUNITY): Payer: Self-pay | Admitting: *Deleted

## 2020-03-12 ENCOUNTER — Other Ambulatory Visit: Payer: Self-pay

## 2020-03-12 ENCOUNTER — Inpatient Hospital Stay (HOSPITAL_COMMUNITY)
Admission: EM | Admit: 2020-03-12 | Discharge: 2020-03-15 | DRG: 378 | Disposition: A | Discharge status: Home / Self Care | Payer: Medicare HMO | Attending: Family Medicine | Admitting: Family Medicine

## 2020-03-12 DIAGNOSIS — Z79899 Other long term (current) drug therapy: Secondary | ICD-10-CM

## 2020-03-12 DIAGNOSIS — I130 Hypertensive heart and chronic kidney disease with heart failure and stage 1 through stage 4 chronic kidney disease, or unspecified chronic kidney disease: Secondary | ICD-10-CM | POA: Diagnosis present

## 2020-03-12 DIAGNOSIS — D124 Benign neoplasm of descending colon: Secondary | ICD-10-CM | POA: Diagnosis not present

## 2020-03-12 DIAGNOSIS — I10 Essential (primary) hypertension: Secondary | ICD-10-CM | POA: Diagnosis not present

## 2020-03-12 DIAGNOSIS — Z801 Family history of malignant neoplasm of trachea, bronchus and lung: Secondary | ICD-10-CM

## 2020-03-12 DIAGNOSIS — E785 Hyperlipidemia, unspecified: Secondary | ICD-10-CM | POA: Diagnosis not present

## 2020-03-12 DIAGNOSIS — N184 Chronic kidney disease, stage 4 (severe): Secondary | ICD-10-CM | POA: Diagnosis not present

## 2020-03-12 DIAGNOSIS — Z8711 Personal history of peptic ulcer disease: Secondary | ICD-10-CM

## 2020-03-12 DIAGNOSIS — E039 Hypothyroidism, unspecified: Secondary | ICD-10-CM | POA: Diagnosis present

## 2020-03-12 DIAGNOSIS — D122 Benign neoplasm of ascending colon: Secondary | ICD-10-CM | POA: Diagnosis present

## 2020-03-12 DIAGNOSIS — R195 Other fecal abnormalities: Secondary | ICD-10-CM

## 2020-03-12 DIAGNOSIS — K922 Gastrointestinal hemorrhage, unspecified: Secondary | ICD-10-CM | POA: Diagnosis present

## 2020-03-12 DIAGNOSIS — R0902 Hypoxemia: Secondary | ICD-10-CM | POA: Diagnosis present

## 2020-03-12 DIAGNOSIS — Z7983 Long term (current) use of bisphosphonates: Secondary | ICD-10-CM | POA: Diagnosis not present

## 2020-03-12 DIAGNOSIS — M810 Age-related osteoporosis without current pathological fracture: Secondary | ICD-10-CM | POA: Diagnosis present

## 2020-03-12 DIAGNOSIS — K649 Unspecified hemorrhoids: Secondary | ICD-10-CM | POA: Diagnosis not present

## 2020-03-12 DIAGNOSIS — K635 Polyp of colon: Secondary | ICD-10-CM | POA: Diagnosis not present

## 2020-03-12 DIAGNOSIS — F331 Major depressive disorder, recurrent, moderate: Secondary | ICD-10-CM | POA: Diagnosis not present

## 2020-03-12 DIAGNOSIS — K3189 Other diseases of stomach and duodenum: Secondary | ICD-10-CM | POA: Diagnosis not present

## 2020-03-12 DIAGNOSIS — D649 Anemia, unspecified: Secondary | ICD-10-CM | POA: Diagnosis present

## 2020-03-12 DIAGNOSIS — N1832 Chronic kidney disease, stage 3b: Secondary | ICD-10-CM | POA: Diagnosis not present

## 2020-03-12 DIAGNOSIS — K644 Residual hemorrhoidal skin tags: Secondary | ICD-10-CM | POA: Diagnosis present

## 2020-03-12 DIAGNOSIS — Z800 Family history of malignant neoplasm of digestive organs: Secondary | ICD-10-CM | POA: Diagnosis not present

## 2020-03-12 DIAGNOSIS — Z808 Family history of malignant neoplasm of other organs or systems: Secondary | ICD-10-CM | POA: Diagnosis not present

## 2020-03-12 DIAGNOSIS — N183 Chronic kidney disease, stage 3 unspecified: Secondary | ICD-10-CM | POA: Diagnosis present

## 2020-03-12 DIAGNOSIS — K5521 Angiodysplasia of colon with hemorrhage: Principal | ICD-10-CM | POA: Diagnosis present

## 2020-03-12 DIAGNOSIS — Z87891 Personal history of nicotine dependence: Secondary | ICD-10-CM

## 2020-03-12 DIAGNOSIS — D509 Iron deficiency anemia, unspecified: Secondary | ICD-10-CM | POA: Diagnosis not present

## 2020-03-12 DIAGNOSIS — Z833 Family history of diabetes mellitus: Secondary | ICD-10-CM | POA: Diagnosis not present

## 2020-03-12 DIAGNOSIS — Z20822 Contact with and (suspected) exposure to covid-19: Secondary | ICD-10-CM | POA: Diagnosis not present

## 2020-03-12 DIAGNOSIS — Z8249 Family history of ischemic heart disease and other diseases of the circulatory system: Secondary | ICD-10-CM | POA: Diagnosis not present

## 2020-03-12 DIAGNOSIS — E782 Mixed hyperlipidemia: Secondary | ICD-10-CM | POA: Diagnosis not present

## 2020-03-12 DIAGNOSIS — D500 Iron deficiency anemia secondary to blood loss (chronic): Secondary | ICD-10-CM | POA: Diagnosis not present

## 2020-03-12 DIAGNOSIS — R5383 Other fatigue: Secondary | ICD-10-CM | POA: Diagnosis not present

## 2020-03-12 DIAGNOSIS — Z825 Family history of asthma and other chronic lower respiratory diseases: Secondary | ICD-10-CM

## 2020-03-12 DIAGNOSIS — R0602 Shortness of breath: Secondary | ICD-10-CM | POA: Diagnosis not present

## 2020-03-12 DIAGNOSIS — K449 Diaphragmatic hernia without obstruction or gangrene: Secondary | ICD-10-CM | POA: Diagnosis not present

## 2020-03-12 DIAGNOSIS — Z79890 Hormone replacement therapy (postmenopausal): Secondary | ICD-10-CM

## 2020-03-12 DIAGNOSIS — I5032 Chronic diastolic (congestive) heart failure: Secondary | ICD-10-CM | POA: Diagnosis present

## 2020-03-12 HISTORY — DX: Gastrointestinal hemorrhage, unspecified: K92.2

## 2020-03-12 LAB — COMPREHENSIVE METABOLIC PANEL
ALT: 10 U/L (ref 0–44)
AST: 16 U/L (ref 15–41)
Albumin: 3.3 g/dL — ABNORMAL LOW (ref 3.5–5.0)
Alkaline Phosphatase: 34 U/L — ABNORMAL LOW (ref 38–126)
Anion gap: 8 (ref 5–15)
BUN: 28 mg/dL — ABNORMAL HIGH (ref 8–23)
CO2: 23 mmol/L (ref 22–32)
Calcium: 8.5 mg/dL — ABNORMAL LOW (ref 8.9–10.3)
Chloride: 106 mmol/L (ref 98–111)
Creatinine, Ser: 1.83 mg/dL — ABNORMAL HIGH (ref 0.44–1.00)
GFR calc Af Amer: 28 mL/min — ABNORMAL LOW (ref >60)
GFR calc non Af Amer: 24 mL/min — ABNORMAL LOW (ref >60)
Glucose, Bld: 93 mg/dL (ref 70–99)
Potassium: 3.6 mmol/L (ref 3.5–5.1)
Sodium: 137 mmol/L (ref 135–145)
Total Bilirubin: 0.8 mg/dL (ref 0.3–1.2)
Total Protein: 6.5 g/dL (ref 6.5–8.1)

## 2020-03-12 LAB — CBC WITH DIFFERENTIAL/PLATELET
Abs Immature Granulocytes: 0.02 10*3/uL (ref 0.00–0.07)
Basophils Absolute: 0.1 10*3/uL (ref 0.0–0.1)
Basophils Relative: 1 %
Eosinophils Absolute: 0.3 10*3/uL (ref 0.0–0.5)
Eosinophils Relative: 4 %
HCT: 25.4 % — ABNORMAL LOW (ref 36.0–46.0)
Hemoglobin: 7.4 g/dL — ABNORMAL LOW (ref 12.0–15.0)
Immature Granulocytes: 0 %
Lymphocytes Relative: 16 %
Lymphs Abs: 0.9 10*3/uL (ref 0.7–4.0)
MCH: 25.3 pg — ABNORMAL LOW (ref 26.0–34.0)
MCHC: 29.1 g/dL — ABNORMAL LOW (ref 30.0–36.0)
MCV: 86.7 fL (ref 80.0–100.0)
Monocytes Absolute: 0.8 10*3/uL (ref 0.1–1.0)
Monocytes Relative: 14 %
Neutro Abs: 3.6 10*3/uL (ref 1.7–7.7)
Neutrophils Relative %: 65 %
Platelets: 252 10*3/uL (ref 150–400)
RBC: 2.93 MIL/uL — ABNORMAL LOW (ref 3.87–5.11)
RDW: 13.8 % (ref 11.5–15.5)
WBC: 5.6 10*3/uL (ref 4.0–10.5)
nRBC: 0 % (ref 0.0–0.2)

## 2020-03-12 LAB — CBC
HCT: 24.4 % — ABNORMAL LOW (ref 36.0–46.0)
HCT: 31.9 % — ABNORMAL LOW (ref 36.0–46.0)
Hemoglobin: 7.3 g/dL — ABNORMAL LOW (ref 12.0–15.0)
Hemoglobin: 9.8 g/dL — ABNORMAL LOW (ref 12.0–15.0)
MCH: 26 pg (ref 26.0–34.0)
MCH: 26.3 pg (ref 26.0–34.0)
MCHC: 29.9 g/dL — ABNORMAL LOW (ref 30.0–36.0)
MCHC: 30.7 g/dL (ref 30.0–36.0)
MCV: 86.8 fL (ref 80.0–100.0)
MCV: 85.5 fL (ref 80.0–100.0)
Platelets: 233 10*3/uL (ref 150–400)
Platelets: 294 10*3/uL (ref 150–400)
RBC: 2.81 MIL/uL — ABNORMAL LOW (ref 3.87–5.11)
RBC: 3.73 MIL/uL — ABNORMAL LOW (ref 3.87–5.11)
RDW: 13.6 % (ref 11.5–15.5)
RDW: 13.8 % (ref 11.5–15.5)
WBC: 5.8 10*3/uL (ref 4.0–10.5)
WBC: 7.4 10*3/uL (ref 4.0–10.5)
nRBC: 0 % (ref 0.0–0.2)
nRBC: 0 % (ref 0.0–0.2)

## 2020-03-12 LAB — TROPONIN I (HIGH SENSITIVITY)
Troponin I (High Sensitivity): 16 ng/L (ref <18)
Troponin I (High Sensitivity): 17 ng/L (ref <18)

## 2020-03-12 LAB — POC OCCULT BLOOD, ED: Fecal Occult Bld: POSITIVE — AB

## 2020-03-12 LAB — PREPARE RBC (CROSSMATCH)

## 2020-03-12 LAB — PROTIME-INR
INR: 1.1 (ref 0.8–1.2)
Prothrombin Time: 14 seconds (ref 11.4–15.2)

## 2020-03-12 LAB — SARS CORONAVIRUS 2 (TAT 6-24 HRS): SARS Coronavirus 2: NEGATIVE

## 2020-03-12 LAB — BRAIN NATRIURETIC PEPTIDE: B Natriuretic Peptide: 135.3 pg/mL — ABNORMAL HIGH (ref 0.0–100.0)

## 2020-03-12 MED ORDER — FLUOXETINE HCL 20 MG PO CAPS
40.0000 mg | ORAL_CAPSULE | Freq: Every day | ORAL | Status: DC
  Administered 2020-03-13 – 2020-03-15 (×3): 40 mg via ORAL
  Filled 2020-03-12 (×3): qty 2

## 2020-03-12 MED ORDER — PANTOPRAZOLE SODIUM 40 MG IV SOLR
40.0000 mg | Freq: Once | INTRAVENOUS | Status: AC
  Administered 2020-03-12: 40 mg via INTRAVENOUS
  Filled 2020-03-12: qty 40

## 2020-03-12 MED ORDER — LORATADINE 10 MG PO TABS
10.0000 mg | ORAL_TABLET | Freq: Every day | ORAL | Status: DC
  Administered 2020-03-13 – 2020-03-15 (×3): 10 mg via ORAL
  Filled 2020-03-12 (×3): qty 1

## 2020-03-12 MED ORDER — SODIUM CHLORIDE 0.9 % IV SOLN
10.0000 mL/h | Freq: Once | INTRAVENOUS | Status: AC
  Administered 2020-03-12: 10 mL/h via INTRAVENOUS

## 2020-03-12 MED ORDER — ACETAMINOPHEN 325 MG PO TABS
650.0000 mg | ORAL_TABLET | Freq: Four times a day (QID) | ORAL | Status: DC | PRN
  Administered 2020-03-13 – 2020-03-14 (×2): 650 mg via ORAL
  Filled 2020-03-12 (×2): qty 2

## 2020-03-12 MED ORDER — MIRABEGRON ER 25 MG PO TB24
25.0000 mg | ORAL_TABLET | Freq: Every day | ORAL | Status: DC
  Administered 2020-03-13 – 2020-03-15 (×3): 25 mg via ORAL
  Filled 2020-03-12 (×3): qty 1

## 2020-03-12 MED ORDER — DIPHENHYDRAMINE HCL 25 MG PO CAPS: 25.0000 mg | ORAL_CAPSULE | Freq: Every evening | ORAL | Status: DC | PRN

## 2020-03-12 MED ORDER — VERAPAMIL HCL ER 240 MG PO TBCR
240.0000 mg | EXTENDED_RELEASE_TABLET | Freq: Every day | ORAL | Status: DC
  Administered 2020-03-12 – 2020-03-14 (×3): 240 mg via ORAL
  Filled 2020-03-12 (×4): qty 1

## 2020-03-12 MED ORDER — PANTOPRAZOLE SODIUM 40 MG IV SOLR
40.0000 mg | Freq: Two times a day (BID) | INTRAVENOUS | Status: DC
  Administered 2020-03-12 – 2020-03-13 (×4): 40 mg via INTRAVENOUS
  Filled 2020-03-12 (×3): qty 40

## 2020-03-12 MED ORDER — LEVOTHYROXINE SODIUM 88 MCG PO TABS
88.0000 ug | ORAL_TABLET | Freq: Every day | ORAL | Status: DC
  Administered 2020-03-14 – 2020-03-15 (×2): 88 ug via ORAL
  Filled 2020-03-12 (×2): qty 1

## 2020-03-12 MED ORDER — PRAVASTATIN SODIUM 40 MG PO TABS
40.0000 mg | ORAL_TABLET | Freq: Every day | ORAL | Status: DC
  Administered 2020-03-13 – 2020-03-15 (×3): 40 mg via ORAL
  Filled 2020-03-12 (×3): qty 1

## 2020-03-12 MED ORDER — LACTATED RINGERS IV SOLN: INTRAVENOUS | Status: DC

## 2020-03-12 MED ORDER — PEG-KCL-NACL-NASULF-NA ASC-C 100 G PO SOLR: 1.0000 | Freq: Once | ORAL | Status: DC

## 2020-03-12 MED ORDER — ACETAMINOPHEN 650 MG RE SUPP: 650.0000 mg | Freq: Four times a day (QID) | RECTAL | Status: DC | PRN

## 2020-03-12 MED ORDER — ONDANSETRON HCL 4 MG/2ML IJ SOLN: 4.0000 mg | Freq: Four times a day (QID) | INTRAMUSCULAR | Status: DC | PRN

## 2020-03-12 MED ORDER — PEG-KCL-NACL-NASULF-NA ASC-C 100 G PO SOLR
0.5000 | Freq: Once | ORAL | Status: AC
  Administered 2020-03-13: 100 g via ORAL
  Filled 2020-03-12: qty 1

## 2020-03-12 MED ORDER — SODIUM CHLORIDE 0.9% FLUSH
3.0000 mL | Freq: Two times a day (BID) | INTRAVENOUS | Status: DC
  Administered 2020-03-12 – 2020-03-14 (×4): 3 mL via INTRAVENOUS

## 2020-03-12 MED ORDER — ONDANSETRON HCL 4 MG PO TABS: 4.0000 mg | ORAL_TABLET | Freq: Four times a day (QID) | ORAL | Status: DC | PRN

## 2020-03-12 MED ORDER — PEG-KCL-NACL-NASULF-NA ASC-C 100 G PO SOLR
0.5000 | Freq: Once | ORAL | Status: AC
  Administered 2020-03-12: 100 g via ORAL
  Filled 2020-03-12: qty 1

## 2020-03-12 NOTE — H&P (Signed)
History and Physical    [name] EPP:[id number] DOB: 07/05/1930 DOA: 03/12/2020  PCP: [name], MD Consultants:  Gwenlyn Found - cardiology; Higgs - oncology Patient coming from:  Home - lives alone in [place] on [address]; [place]: Daughter, [name], [phone]   Chief Complaint: Tarry stools, SOB  HPI: [name] is a 86 y.o. female with medical history significant of hypothyroidism; HTN; CHF; stage 3 CKD; and HLD presenting with weakness, tarry stools.  She had Prolia injection and blood transfusion on 3/10.  She has been feeling SOB.  She was in quarantine for a long time. Now with DOE.  Hgb 7 last week, then 6.7.  She was transfused on Wednesday.  She seemed to be "a little bit peppier", slightly less SOB.  Occasionally feels weak, hers "legs feel like spaghetti."  She did incidentally mention dark stools at her PCP office.  Still having ongoing dark stools.  She complained of abdominal pain yesterday.  No fever.  Her daughter is not aware of prior h/o GI bleeding, and her last GI evaluation was quite remote.    ED Course:  Symptomatic anemia, UGI bleed.  [facility] will consult.  Hgb 6.7 last week, got outpatient transfusion.  Hgb 7.4, hypoxic with ambulation.  Not currently transfusing - not <7 but hypoxic so will give 1 unit.  Review of Systems: As per HPI; otherwise review of systems reviewed and negative.   Ambulatory Status:  Ambulates with a walker  Past Medical History:  Diagnosis Date  . Arthritis   . Depression   . History of anemia   . History of chicken pox   . History of ulcer disease 1990s  . HLD (hyperlipidemia)   . HTN (hypertension)   . Hypothyroidism   . Osteoporosis 03/2016   at [facility] - T -3.1 hip, -1.0 spine  . Uses hearing aid     Past Surgical History:  Procedure Laterality Date  . APPENDECTOMY  1974  . BACK SURGERY  1979  . CARDIOVASCULAR STRESS TEST  2009-04-11   WNL  . CARPAL TUNNEL RELEASE Left 04/28/2017  . CATARACT EXTRACTION  Bilateral 04-12-2011  . DEXA  03/2016   at [facility] - T -3.1 hip, -1.0 spine  . GALLBLADDER SURGERY  April 11, 1973  . [place]   right  . [place]   left  . KNEE SURGERY  04-12-07  . TONSILLECTOMY  1945  . US ECHOCARDIOGRAPHY  04/11/2009   LVEF 65-75%, overall normal, mildly decreased LV diastolic compliance    Social History   Socioeconomic History  . Marital status: Widowed    Spouse name: Not on file  . Number of children: Not on file  . Years of education: Not on file  . Highest education level: Not on file  Occupational History  . Not on file  Tobacco Use  . Smoking status: Former Smoker    Quit date: 12/30/1992    Years since quitting: 27.2  . Smokeless tobacco: Never Used  Substance and Sexual Activity  . Alcohol use: No  . Drug use: No  . Sexual activity: Never  Other Topics Concern  . Not on file  Social History Narrative   Widower since 1999/04/12 - husband battled cancer for 6 yrs, died from MI   Daughter Jarielys Girardot) passed away 04/12/2015   Lies in retirement home - [place] on [address].   Occupation: retired   HS: 12th grade   Activity: no regular exercise  Diet: good water, fruits/vegetables daily   Social Determinants of Health   Financial Resource Strain:   . Difficulty of Paying Living Expenses:   Food Insecurity:   . Worried About Charity fundraiser in the Last Year:   . Arboriculturist in the Last Year:   Transportation Needs:   . Film/video editor (Medical):   Marland Kitchen Lack of Transportation (Non-Medical):   Physical Activity:   . Days of Exercise per Week:   . Minutes of Exercise per Session:   Stress:   . Feeling of Stress :   Social Connections:   . Frequency of Communication with Friends and Family:   . Frequency of Social Gatherings with Friends and Family:   . Attends Religious Services:   . Active Member of Clubs or Organizations:   . Attends Archivist Meetings:   Marland Kitchen Marital Status:   Intimate Partner Violence:   . Fear of Current  or Ex-Partner:   . Emotionally Abused:   Marland Kitchen Physically Abused:   . Sexually Abused:     No Known Allergies  Family History  Problem Relation Age of Onset  . Cancer Mother 69       lung, nonsmoker  . Cancer Father        lung, nonsmoker  . Cancer Brother 30       lung, smoker  . Cancer Sister 101       brain  . Cancer Sister 46       lung, smoker  . COPD Sister        smoker  . Cancer Sister 39       colon  . Hypertension Daughter   . Diabetes Daughter   . CAD Neg Hx   . Stroke Neg Hx     Prior to Admission medications   Medication Sig Start Date End Date Taking? Authorizing Provider  acetaminophen (TYLENOL) 500 MG tablet Take 1,000 mg by mouth daily as needed.     [provider]  cholecalciferol (VITAMIN D) 1000 UNITS tablet Take 1,000 Units by mouth daily.    [provider]  denosumab (PROLIA) 60 MG/ML SOSY injection Inject 60 mg into the skin every 6 (six) months. 12/06/19   [name], MD  diphenhydramine-acetaminophen (TYLENOL PM) 25-500 MG TABS Take 2 tablets by mouth at bedtime as needed.    [provider]  famotidine (PEPCID) 20 MG tablet Take 20 mg by mouth daily.    [provider]  ferrous sulfate 325 (65 FE) MG tablet Take 1 tablet (325 mg total) by mouth daily with breakfast. 03/08/20   [name], MD  FLUoxetine (PROZAC) 40 MG capsule Take 1 capsule (40 mg total) by mouth daily. 02/23/20   [name], MD  furosemide (LASIX) 20 MG tablet Take 10 mg by mouth daily.    [place], [place], [place]  levothyroxine (SYNTHROID) 88 MCG tablet TAKE 1 TABLET BY MOUTH EVERY DAY BEFORE BREAKFAST 08/24/19   [name], MD  lisinopril (ZESTRIL) 40 MG tablet TAKE 1 TABLET BY MOUTH TWICE DAILY 08/24/19   [name], MD  loratadine (CLARITIN) 10 MG tablet Take 10 mg by mouth daily.    [provider]  mirabegron ER (MYRBETRIQ) 25 MG TB24 tablet Take 1 tablet (25 mg total) by mouth daily. 12/20/19   [name],  MD  omeprazole (PRILOSEC) 40 MG capsule Take 1 capsule (40 mg total) by mouth daily. 03/04/20   [name], MD  pravastatin (PRAVACHOL) 40  MG tablet TAKE 1 TABLET BY MOUTH EVERY DAY 03/03/20   [name], MD  verapamil (CALAN-SR) 240 MG CR tablet Take 1 tablet (240 mg total) by mouth at bedtime. 12/17/19   Lorretta Harp, MD  vitamin B-12 (CYANOCOBALAMIN) 500 MCG tablet Take 1 tablet (500 mcg total) by mouth once a week. 03/08/20   [name], MD    Physical Exam: Vitals:   03/12/20 1045 03/12/20 1100 03/12/20 1115 03/12/20 1130  BP: (!) 130/59 127/61 132/63 (!) 122/55  Pulse: 78 77 80 74  Resp: 17 (!) 21 18 19   Temp:      TempSrc:      SpO2: 96% 96% 96% 96%  Weight:      Height:         . General:  Appears calm and comfortable and is NAD . Eyes:  PERRL, EOMI, normal lids, iris . ENT:  Very hard of hearing, grossly normal lips & tongue, mmm . Neck:  no LAD, masses or thyromegaly . Cardiovascular:  RRR, no m/r/g. No LE edema.  Marland Kitchen Respiratory:   CTA bilaterally with no wheezes/rales/rhonchi.  Normal respiratory effort. . Abdomen:  soft, diffusely TTP, ND, NABS . Skin:  no rash or induration seen on limited exam . Musculoskeletal:  grossly normal tone BUE/BLE, good ROM, no bony abnormality . Psychiatric:  blunted mood and affect, speech fluent and appropriate, AOx3 . Neurologic:  CN 2-12 grossly intact, moves all extremities in coordinated fashion    Radiological Exams on Admission: DG Chest 2 View  Result Date: 03/12/2020 CLINICAL DATA:  Short of breath. EXAM: CHEST - 2 VIEW COMPARISON:  08/18/2019 FINDINGS: Cardiac silhouette is top-normal in size. No mediastinal or hilar masses. No evidence of adenopathy. There are prominent bronchovascular markings, stable. Lungs otherwise clear. No pleural effusion or pneumothorax. Skeletal structures are demineralized. Mild vertebral compression deformity at the thoracolumbar junction, stable. IMPRESSION: No acute  cardiopulmonary disease. Electronically Signed   By: Lajean Manes M.D.   On: 03/12/2020 09:41    EKG: Independently reviewed.  NSR with rate 79; LAFB; low voltage with no evidence of acute ischemia   Labs on Admission: I have personally reviewed the available labs and imaging studies at the time of the admission.  Pertinent labs:   BUN 28/Creatinine 1.83/GFR 24 - stable Albumin 3.3 BNP 135.3 HS troponin 16 WBC 5.6 Hgb 7.3; 6.7 on 3/8 and tranfused    Assessment/Plan Principal Problem:   Acute upper GI bleeding Active Problems:   Hypothyroidism   Hyperlipidemia   Hypertension   MDD (major depressive disorder), recurrent episode, moderate (HCC)   Symptomatic anemia   Chronic kidney disease, stage 4, severely decreased GFR (HCC)   Chronic diastolic CHF (congestive heart failure) (HCC)    Upper GI bleeding with recurrent symptomatic anemia -Patient c/o exertional SOB when she saw her PCP on 3/5 -She had reported h/o ulcer disease and so was started on PPI and referred to GI when she was found to have Hgb decreased from 11.8 to 7.0 -She was arranged to have a transfusion as an outpatient of 1 unit PRBC -She returns today with recurrent/worsening DOE and persistent tarry stools -Hgb today is 7.3 -This is likely the source of her ongoing SOB and fatigue -Patient has history of "ulcers" and so gastritis or gastric/duodenal ulcer appear to be the most likely source of her symptoms.  -Type and screen were done in ED.  -One unit of blood was ordered by ED.  -will admit to tele  bed -GI consulted by ED, will follow up recommendations -NPO for possible EGD -LR at 75 mL/hr -Start IV pantoprazole 40 mg bid -Zofran IV for nausea -Avoid NSAIDs and SQ heparin -Maintain IV access (2 large bore IVs if possible). -Monitor closely and follow cbc tonight at 2000 and again in AM, transfuse as necessary.  Stage 4 CKD -Appears to be stable with stage 4 CKD at this time -Will follow -At  age 82, it appears fairly unlikely that HD would be considered or that progression to HD would be necessary but encourage ongoing PCP monitoring and consideration -Hold Lisinopril  HTN -Hold Lisinopril, as above -Continue Verapamil  HLD -Continue Pravachol  Hypothyroidism -Continue Synthroid at current dose for now  Depression -Continue Prozac  Chronic diastolic CHF -Echo on 07/05/60 with preserved EF but severe asymmetric LVH and impaired diastolic relaxation -Appears to be compensated at this time  -Hold Lasix   Note: This patient has been tested and is pending for the novel coronavirus COVID-19.  DVT prophylaxis:  SCDs Code Status:  Full - confirmed with patient/family Family Communication:  Her daughter was present throughout evaluation and is likely to stay for entire hospitalization Disposition Plan: She is anticipated to d/c to home without [facility] services once her GI issues have been resolved.  She lives in independent living and is likely to return there. Consults called: GI Admission status: Admit - It is my clinical opinion that admission to INPATIENT is reasonable and necessary because of the expectation that this patient will require hospital care that crosses at least 2 midnights to treat this condition based on the medical complexity of the problems presented.  Given the aforementioned information, the predictability of an adverse outcome is felt to be significant.     Karmen Bongo MD [facility]   How to contact the [facility] Attending or Consulting provider [place] or covering provider during after hours [place], for this patient?  1. Check the care team in [facility] and look for a) attending/consulting TR[facility] provider listed and b) the [facility] team listed 2. Log into [email or web address] and use [place]'s universal password to access. If you do not have the password, please contact the hospital operator. 3. Locate the [facility] provider you are looking for under [facility]  and page to a number that you can be directly reached. 4. If you still have difficulty reaching the provider, please page the [facility] (Director on Call) for the Hospitalists listed on amion for assistance.   03/12/2020, 1:26 PM

## 2020-03-12 NOTE — ED Notes (Signed)
Patient transported to X-ray 

## 2020-03-12 NOTE — Consult Note (Addendum)
Referring Provider:  EDP Primary Care Physician:  [name], MD Primary Gastroenterologist:  Althia Forts  Reason for Consultation:  Symptomatic anemia and heme positive stool  HPI: [name] is a 86 y.o. female with past medical history significant for hypothyroidism, hypertension, CHF, chronic kidney disease stage III, hyperlipidemia who presented to [facility] with complaints of weakness and black stools.  Was supposed to see me in the office this week for anemia.  Hgb 6 months ago was 11.8 grams.  6.7 grams six days ago and was transfused as outpatient with just one unit PRBC's.  Was told to come to the ED for any recurrent SOB, dizzy, etc.  She felt SOB again yesterday and complained of some abdominal pain.  They say stools have been black even prior to recently prescribed iron supplements.  Today 7.3 grams.  Heme positive.  They are going to give her one unit of PRBC's for now in the ED.  Says that she tends to be constipated of late.  Had a good bowel movement since she has been in the ED and denies abdominal pain at this time.    No blood thinners or NSAID's.  Remote colonoscopy.  ? EGD in the past.  She reports bleeding ulcer 40+ years ago.  Daughter at bedside during my visit.   Past Medical History:  Diagnosis Date  . Arthritis   . Depression   . History of anemia   . History of chicken pox   . History of ulcer disease 1990s  . HLD (hyperlipidemia)   . HTN (hypertension)   . Hypothyroidism   . Osteoporosis 03/2016   at [facility] - T -3.1 hip, -1.0 spine  . Uses hearing aid     Past Surgical History:  Procedure Laterality Date  . APPENDECTOMY  1974  . BACK SURGERY  1979  . CARDIOVASCULAR STRESS TEST  2010   WNL  . CARPAL TUNNEL RELEASE Left 04/28/2017  . CATARACT EXTRACTION Bilateral 2012  . DEXA  03/2016   at [facility] - T -3.1 hip, -1.0 spine  . GALLBLADDER SURGERY  1974  . [place]   right  . [place]   left  . KNEE SURGERY   2008  . TONSILLECTOMY  1945  . US ECHOCARDIOGRAPHY  2010   LVEF 65-75%, overall normal, mildly decreased LV diastolic compliance    Prior to Admission medications   Medication Sig Start Date End Date Taking? Authorizing Provider  acetaminophen (TYLENOL) 500 MG tablet Take 1,000 mg by mouth daily as needed.     [provider]  cholecalciferol (VITAMIN D) 1000 UNITS tablet Take 1,000 Units by mouth daily.    [provider]  denosumab (PROLIA) 60 MG/ML SOSY injection Inject 60 mg into the skin every 6 (six) months. 12/06/19   [name], MD  diphenhydramine-acetaminophen (TYLENOL PM) 25-500 MG TABS Take 2 tablets by mouth at bedtime as needed.    [provider]  famotidine (PEPCID) 20 MG tablet Take 20 mg by mouth daily.    [provider]  ferrous sulfate 325 (65 FE) MG tablet Take 1 tablet (325 mg total) by mouth daily with breakfast. 03/08/20   [name], MD  FLUoxetine (PROZAC) 40 MG capsule Take 1 capsule (40 mg total) by mouth daily. 02/23/20   [name], MD  furosemide (LASIX) 20 MG tablet Take 10 mg by mouth daily.    [place], [place]  levothyroxine (SYNTHROID) 88 MCG tablet  TAKE 1 TABLET BY MOUTH EVERY DAY BEFORE BREAKFAST 08/24/19   [name], MD  lisinopril (ZESTRIL) 40 MG tablet TAKE 1 TABLET BY MOUTH TWICE DAILY 08/24/19   [name], MD  loratadine (CLARITIN) 10 MG tablet Take 10 mg by mouth daily.    [provider]  mirabegron ER (MYRBETRIQ) 25 MG TB24 tablet Take 1 tablet (25 mg total) by mouth daily. 12/20/19   [name], MD  omeprazole (PRILOSEC) 40 MG capsule Take 1 capsule (40 mg total) by mouth daily. 03/04/20   [name], MD  pravastatin (PRAVACHOL) 40 MG tablet TAKE 1 TABLET BY MOUTH EVERY DAY 03/03/20   [name], MD  verapamil (CALAN-SR) 240 MG CR tablet Take 1 tablet (240 mg total) by mouth at bedtime. 12/17/19   Lorretta Harp, MD  vitamin B-12 (CYANOCOBALAMIN) 500 MCG  tablet Take 1 tablet (500 mcg total) by mouth once a week. 03/08/20   [name], MD    Current Facility-Administered Medications  Medication Dose Route Frequency Provider Last Rate Last Admin  . 0.9 %  sodium chloride infusion  10 mL/hr Intravenous Once Alveria Apley, PA-C       Current Outpatient Medications  Medication Sig Dispense Refill  . acetaminophen (TYLENOL) 500 MG tablet Take 1,000 mg by mouth daily as needed.     . cholecalciferol (VITAMIN D) 1000 UNITS tablet Take 1,000 Units by mouth daily.    Marland Kitchen denosumab (PROLIA) 60 MG/ML SOSY injection Inject 60 mg into the skin every 6 (six) months.    . diphenhydramine-acetaminophen (TYLENOL PM) 25-500 MG TABS Take 2 tablets by mouth at bedtime as needed.    . famotidine (PEPCID) 20 MG tablet Take 20 mg by mouth daily.    . ferrous sulfate 325 (65 FE) MG tablet Take 1 tablet (325 mg total) by mouth daily with breakfast.    . FLUoxetine (PROZAC) 40 MG capsule Take 1 capsule (40 mg total) by mouth daily. 90 capsule 3  . furosemide (LASIX) 20 MG tablet Take 10 mg by mouth daily.    Marland Kitchen levothyroxine (SYNTHROID) 88 MCG tablet TAKE 1 TABLET BY MOUTH EVERY DAY BEFORE BREAKFAST 90 tablet 2  . lisinopril (ZESTRIL) 40 MG tablet TAKE 1 TABLET BY MOUTH TWICE DAILY 180 tablet 2  . loratadine (CLARITIN) 10 MG tablet Take 10 mg by mouth daily.    . mirabegron ER (MYRBETRIQ) 25 MG TB24 tablet Take 1 tablet (25 mg total) by mouth daily. 30 tablet 3  . omeprazole (PRILOSEC) 40 MG capsule Take 1 capsule (40 mg total) by mouth daily. 30 capsule 3  . pravastatin (PRAVACHOL) 40 MG tablet TAKE 1 TABLET BY MOUTH EVERY DAY 90 tablet 2  . verapamil (CALAN-SR) 240 MG CR tablet Take 1 tablet (240 mg total) by mouth at bedtime. 90 tablet 1  . vitamin B-12 (CYANOCOBALAMIN) 500 MCG tablet Take 1 tablet (500 mcg total) by mouth once a week.      Allergies as of 03/12/2020  . (No Known Allergies)    Family History  Problem Relation Age of Onset  . Cancer  Mother 23       lung, nonsmoker  . Cancer Father        lung, nonsmoker  . Cancer Brother 56       lung, smoker  . Cancer Sister 77       brain  . Cancer Sister 79       lung, smoker  . COPD Sister  smoker  . Cancer Sister 46       colon  . Hypertension Daughter   . Diabetes Daughter   . CAD Neg Hx   . Stroke Neg Hx     Social History   Socioeconomic History  . Marital status: Widowed    Spouse name: Not on file  . Number of children: Not on file  . Years of education: Not on file  . Highest education level: Not on file  Occupational History  . Not on file  Tobacco Use  . Smoking status: Former Smoker    Quit date: 12/30/1992    Years since quitting: 27.2  . Smokeless tobacco: Never Used  Substance and Sexual Activity  . Alcohol use: No  . Drug use: No  . Sexual activity: Never  Other Topics Concern  . Not on file  Social History Narrative   Widower since 02-Apr-1999 - husband battled cancer for 6 yrs, died from MI   Daughter Heyli Min) passed away 2015-04-02   Lies in retirement home - [place] on [address].   Occupation: retired   HS: 12th grade   Activity: no regular exercise   Diet: good water, fruits/vegetables daily   Social Determinants of Health   Financial Resource Strain:   . Difficulty of Paying Living Expenses:   Food Insecurity:   . Worried About Charity fundraiser in the Last Year:   . Arboriculturist in the Last Year:   Transportation Needs:   . Film/video editor (Medical):   Marland Kitchen Lack of Transportation (Non-Medical):   Physical Activity:   . Days of Exercise per Week:   . Minutes of Exercise per Session:   Stress:   . Feeling of Stress :   Social Connections:   . Frequency of Communication with Friends and Family:   . Frequency of Social Gatherings with Friends and Family:   . Attends Religious Services:   . Active Member of Clubs or Organizations:   . Attends Archivist Meetings:   Marland Kitchen Marital Status:   Intimate Partner  Violence:   . Fear of Current or Ex-Partner:   . Emotionally Abused:   Marland Kitchen Physically Abused:   . Sexually Abused:     Review of Systems: ROS is O/W negative except as mentioned in HPI.  Physical Exam: Vital signs in last 24 hours: Temp:  [98 F (36.7 C)] 98 F (36.7 C) (03/14 0842) Pulse Rate:  [74-95] 74 (03/14 1130) Resp:  [17-23] 19 (03/14 1130) BP: (122-158)/(55-72) 122/55 (03/14 1130) SpO2:  01[birth year]-1988 %-98 %] 96 % (03/14 1130) Weight:  [99.8 kg-109 kg] 109 kg (03/14 0853)   General:  Alert, Well-developed, well-nourished, pleasant and cooperative in NAD Head:  Normocephalic and atraumatic. Eyes:  Sclera clear, no icterus.  Conjunctiva pink. Ears:  Very HOH. Mouth:  No deformity or lesions.   Lungs:  Clear throughout to auscultation.  No wheezes, crackles, or rhonchi.  Heart:  Regular rate and rhythm; no murmurs, clicks, rubs, or gallops. Abdomen:  Soft, non-distended.  BS present.  Non-tender.   Rectal:  Deferred.  Heme positive by EDP.  Msk:  Symmetrical without gross deformities. Pulses:  Normal pulses noted. Extremities:  Without clubbing or edema. Neurologic:  Alert and oriented x 4;  grossly normal neurologically. Skin:  Intact without significant lesions or rashes. Psych:  Alert and cooperative. Normal mood and affect.  Lab Results: Recent Labs    03/12/20 0909 03/12/20 1115  WBC 5.6 5.8  HGB 7.4* 7.3*  HCT 25.4* 24.4*  PLT 252 233   BMET Recent Labs    03/12/20 0909  NA 137  K 3.6  CL 106  CO2 23  GLUCOSE 93  BUN 28*  CREATININE 1.83*  CALCIUM 8.5*   LFT Recent Labs    03/12/20 0909  PROT 6.5  ALBUMIN 3.3*  AST 16  ALT 10  ALKPHOS 34*  BILITOT 0.8   PT/INR Recent Labs    03/12/20 0909  LABPROT 14.0  INR 1.1   Studies/Results: DG Chest 2 View  Result Date: 03/12/2020 CLINICAL DATA:  Short of breath. EXAM: CHEST - 2 VIEW COMPARISON:  08/18/2019 FINDINGS: Cardiac silhouette is top-normal in size. No mediastinal or hilar masses. No  evidence of adenopathy. There are prominent bronchovascular markings, stable. Lungs otherwise clear. No pleural effusion or pneumothorax. Skeletal structures are demineralized. Mild vertebral compression deformity at the thoracolumbar junction, stable. IMPRESSION: No acute cardiopulmonary disease. Electronically Signed   By: Lajean Manes M.D.   On: 03/12/2020 09:41   IMPRESSION:  *New symptomatic IDA: Hemoglobin 6 months ago was in the 11 g range.  Now recently with 6.7 g hemoglobin.  Transfused as an outpatient.  Continues to be short of breath and reported black stools, heme positive.  Hgb 7.3 grams here.  Going to receive one unit PRBC's here. *CKD stage 3   PLAN: *We will plan for both EGD and colonoscopy on Monday, March 15.  Clear liquids today then NPO after midnight. *Monitor hemoglobin and transfuse further if needed. *Pantoprazole 40 mg IV BID for now.   Laban Emperor. Zehr  03/12/2020, 1:01 PM   Attending physician's note   I have taken a history, examined the patient and reviewed the chart. I agree with the Advanced Practitioner's note, impression and recommendations.  Recent onset symptomatic iron def anemia, fecal heme occult positive will need to evaluate for source of GI blood loss. ?Melena Will need to exclude PUD, malignancy Schedule for EGD and colonoscopy  Transfuse prbc  To Hgb >7 Monitor daily CBC PPI BID  The risks and benefits as well as alternatives of endoscopic procedure(s) have been discussed and reviewed. All questions answered. The patient agrees to proceed.   Damaris Hippo , MD [phone]

## 2020-03-12 NOTE — Anesthesia Preprocedure Evaluation (Addendum)
Anesthesia Evaluation  Patient identified by MRN, date of birth, ID band Patient awake    Reviewed: Allergy & Precautions, NPO status , Patient's Chart, lab work & pertinent test results  Airway Mallampati: II  TM Distance: >3 FB Neck ROM: Full    Dental  (+) Dental Advisory Given, Upper Dentures, Lower Dentures   Pulmonary former smoker,    Pulmonary exam normal breath sounds clear to auscultation       Cardiovascular hypertension, Pt. on medications +CHF  Normal cardiovascular exam Rhythm:Regular Rate:Normal  Echo 08/2019: 1. The left ventricle has normal systolic function with an ejection  fraction of 60-65%. The cavity size was normal. There is severe asymmetric  left ventricular hypertrophy of the basal septal wall. Left ventricular  diastolic Doppler parameters are  consistent with impaired relaxation.  2. LVOT turbulence seen in systole without mitral valve SAM.  3. The right ventricle has mildly reduced systolic function. The cavity  was small. There is no increase in right ventricular wall thickness. Right  ventricular systolic pressure is normal.  4. Left atrial size was mildly dilated.  5. Mild thickening of the mitral valve leaflet. Mild calcification of the  mitral valve leaflet. No evidence of mitral valve stenosis.  6. The aortic valve is tricuspid. Mild thickening of the aortic valve.  Mild calcification of the aortic valve. Aortic valve regurgitation is mild  by color flow Doppler.  7. The aorta is normal unless otherwise noted.  8. The aortic root, ascending aorta and aortic arch are normal in size  and structure.    Neuro/Psych PSYCHIATRIC DISORDERS Depression negative neurological ROS     GI/Hepatic Neg liver ROS, GERD  Medicated, heme positive stool   Endo/Other  Hypothyroidism Morbid obesity  Renal/GU Renal InsufficiencyRenal disease     Musculoskeletal  (+) Arthritis ,   Abdominal    Peds  Hematology  (+) Blood dyscrasia, anemia ,   Anesthesia Other Findings Day of surgery medications reviewed with the patient.  Reproductive/Obstetrics                            Anesthesia Physical Anesthesia Plan  ASA: III  Anesthesia Plan: MAC   Post-op Pain Management:    Induction:   PONV Risk Score and Plan: 2 and Propofol infusion  Airway Management Planned:   Additional Equipment:   Intra-op Plan:   Post-operative Plan:   Informed Consent: I have reviewed the patients History and Physical, chart, labs and discussed the procedure including the risks, benefits and alternatives for the proposed anesthesia with the patient or authorized representative who has indicated his/her understanding and acceptance.     Dental advisory given  Plan Discussed with: CRNA  Anesthesia Plan Comments:        Anesthesia Quick Evaluation

## 2020-03-12 NOTE — ED Triage Notes (Signed)
Pt received  A blood transfusion  On 03-08-20 . Pt was told to come to ED for any more SHOB  Or dizziness . Pt lives alone . Pt has reported her stools have been black. Pt denies any pain.

## 2020-03-12 NOTE — ED Provider Notes (Signed)
[facility] EMERGENCY DEPARTMENT Provider Note   CSN: [id number] Arrival date & time: 03/12/20  5885     History Chief Complaint  Patient presents with  . Shortness of Breath    [name] is a 86 y.o. female.  Patient is a 86 year old female with past medical history of CHF, CKD stage III from nursing home presenting to the emergency department for progressive exertional dyspnea over the last couple of months.  She has been evaluated by her primary care doctor for this and was found to have new anemia with dark stools.  Received a blood transfusion this past Wednesday and is due to see GI this Thursday.  Reports seeing GI in the past but it has been several years.  Reports some abdominal upset but no nausea, vomiting, diarrhea.  Is not anticoagulated and does not take aspirin or NSAIDs.  Reports feelings of acid reflux and taking an antacid the other day for it.  Reports that she is more short of breath at rest but also with exertion and does feel lightheaded with standing up.  Reports that when she received a blood transfusion she felt a little bit better but is now feeling worse.  Hemoglobin at time of transfusion was 6.7.        Past Medical History:  Diagnosis Date  . Arthritis   . Depression   . History of anemia   . History of chicken pox   . History of ulcer disease 1990s  . HLD (hyperlipidemia)   . HTN (hypertension)   . Hypothyroidism   . Osteoporosis 03/2016   at [place] - T -3.1 hip, -1.0 spine  . Uses hearing aid     Patient Active Problem List   Diagnosis Date Noted  . Urge incontinence 09/01/2019  . Systolic murmur 02/77/4128  . Hypercalcemia 11/30/2018  . Atypical nevi 11/30/2018  . Family history of lung cancer 11/30/2018  . Pseudogout of left wrist 04/04/2018  . Left wrist pain 04/02/2018  . Thoracic back pain 09/23/2016  . Osteoporosis 03/30/2016  . Advanced care planning/counseling discussion 03/13/2015  . Encounter for general  adult medical examination with abnormal findings 03/13/2015  . CKD (chronic kidney disease) stage 3, GFR 30-59 ml/min (HCC) 03/10/2014  . Medicare annual wellness visit, subsequent 12/17/2012  . CTS (carpal tunnel syndrome) 10/27/2012  . Exertional dyspnea 10/27/2012  . Arthritis   . MDD (major depressive disorder), recurrent episode, moderate ([place])   . History of ulcer disease   . Anemia   . Hypothyroidism 03/11/2012  . Hyperlipidemia 03/11/2012  . Hypertension 03/11/2012    Past Surgical History:  Procedure Laterality Date  . APPENDECTOMY  1974  . BACK SURGERY  1979  . CARDIOVASCULAR STRESS TEST  2010   WNL  . CARPAL TUNNEL RELEASE Left 04/28/2017  . CATARACT EXTRACTION Bilateral 2012  . DEXA  03/2016   at [facility] - T -3.1 hip, -1.0 spine  . GALLBLADDER SURGERY  1974  . [place]   right  . [place]   left  . KNEE SURGERY  2008  . TONSILLECTOMY  1945  . US ECHOCARDIOGRAPHY  2010   LVEF 65-75%, overall normal, mildly decreased LV diastolic compliance     OB History   No obstetric history on file.     Family History  Problem Relation Age of Onset  . Cancer Mother 94       lung, nonsmoker  . Cancer Father  lung, nonsmoker  . Cancer Brother 32       lung, smoker  . Cancer Sister 11       brain  . Cancer Sister 31       lung, smoker  . COPD Sister        smoker  . Cancer Sister 23       colon  . Hypertension Daughter   . Diabetes Daughter   . CAD Neg Hx   . Stroke Neg Hx     Social History   Tobacco Use  . Smoking status: Former Smoker    Quit date: 12/30/1992    Years since quitting: 27.2  . Smokeless tobacco: Never Used  Substance Use Topics  . Alcohol use: No  . Drug use: No    Home Medications Prior to Admission medications   Medication Sig Start Date End Date Taking? Authorizing Provider  acetaminophen (TYLENOL) 500 MG tablet Take 1,000 mg by mouth daily as needed.     [provider]  cholecalciferol  (VITAMIN D) 1000 UNITS tablet Take 1,000 Units by mouth daily.    [provider]  denosumab (PROLIA) 60 MG/ML SOSY injection Inject 60 mg into the skin every 6 (six) months. 12/06/19   [name], MD  diphenhydramine-acetaminophen (TYLENOL PM) 25-500 MG TABS Take 2 tablets by mouth at bedtime as needed.    [provider]  famotidine (PEPCID) 20 MG tablet Take 20 mg by mouth daily.    [provider]  ferrous sulfate 325 (65 FE) MG tablet Take 1 tablet (325 mg total) by mouth daily with breakfast. 03/08/20   [name], MD  FLUoxetine (PROZAC) 40 MG capsule Take 1 capsule (40 mg total) by mouth daily. 02/23/20   [name], MD  furosemide (LASIX) 20 MG tablet Take 10 mg by mouth daily.    [place], [place], [place]  levothyroxine (SYNTHROID) 88 MCG tablet TAKE 1 TABLET BY MOUTH EVERY DAY BEFORE BREAKFAST 08/24/19   [name], MD  lisinopril (ZESTRIL) 40 MG tablet TAKE 1 TABLET BY MOUTH TWICE DAILY 08/24/19   [name], MD  loratadine (CLARITIN) 10 MG tablet Take 10 mg by mouth daily.    [provider]  mirabegron ER (MYRBETRIQ) 25 MG TB24 tablet Take 1 tablet (25 mg total) by mouth daily. 12/20/19   [name], MD  omeprazole (PRILOSEC) 40 MG capsule Take 1 capsule (40 mg total) by mouth daily. 03/04/20   [name], MD  pravastatin (PRAVACHOL) 40 MG tablet TAKE 1 TABLET BY MOUTH EVERY DAY 03/03/20   [name], MD  verapamil (CALAN-SR) 240 MG CR tablet Take 1 tablet (240 mg total) by mouth at bedtime. 12/17/19   Lorretta Harp, MD  vitamin B-12 (CYANOCOBALAMIN) 500 MCG tablet Take 1 tablet (500 mcg total) by mouth once a week. 03/08/20   [name], MD    Allergies    Patient has no known allergies.  Review of Systems   Review of Systems  Constitutional: Positive for fatigue. Negative for appetite change, chills and fever.  HENT: Negative for congestion, ear pain and sore throat.   Eyes: Negative for pain and  visual disturbance.  Respiratory: Positive for shortness of breath. Negative for cough.   Cardiovascular: Negative for chest pain and palpitations.  Gastrointestinal: Positive for abdominal pain. Negative for constipation, diarrhea, nausea and vomiting.  Genitourinary: Negative for dysuria and hematuria.  Musculoskeletal: Negative for arthralgias and back pain.  Skin: Negative for color change and rash.  Neurological: Positive for light-headedness. Negative for dizziness, seizures, syncope, weakness and headaches.  Hematological: Does not bruise/bleed easily.  All other systems reviewed and are negative.   Physical Exam Updated Vital Signs BP (!) 122/55   Pulse 74   Temp 98 F (36.7 C) (Oral)   Resp 19   Ht 5\' 4"  (1.626 m)   Wt 109 kg   SpO2 96%   BMI 41.25 kg/m   Physical Exam Vitals and nursing note reviewed.  Constitutional:      General: She is not in acute distress.    Appearance: Normal appearance. She is well-developed. She is not ill-appearing, toxic-appearing or diaphoretic.  HENT:     Head: Normocephalic and atraumatic.     Mouth/Throat:     Mouth: Mucous membranes are moist.     Pharynx: Oropharynx is clear.  Eyes:     Conjunctiva/sclera: Conjunctivae normal.     Pupils: Pupils are equal, round, and reactive to light.  Cardiovascular:     Rate and Rhythm: Normal rate and regular rhythm.  Pulmonary:     Effort: Pulmonary effort is normal.     Breath sounds: Normal breath sounds.  Abdominal:     General: Bowel sounds are normal.     Palpations: Abdomen is soft. There is no mass.     Tenderness: There is no abdominal tenderness. There is no guarding.  Musculoskeletal:     Right lower leg: Edema present.     Left lower leg: Edema present.  Skin:    General: Skin is dry.     Capillary Refill: Capillary refill takes less than 2 seconds.     Coloration: Skin is pale.  Neurological:     Mental Status: She is alert.  Psychiatric:        Mood and Affect: Mood  normal.     ED Results / Procedures / Treatments   Labs (all labs ordered are listed, but only abnormal results are displayed) Labs Reviewed  COMPREHENSIVE METABOLIC PANEL - Abnormal; Notable for the following components:      Result Value   BUN 28 (*)    Creatinine, Ser 1.83 (*)    Calcium 8.5 (*)    Albumin 3.3 (*)    Alkaline Phosphatase 34 (*)    GFR calc non Af Amer 24 (*)    GFR calc Af Amer 28 (*)    All other components within normal limits  CBC WITH DIFFERENTIAL/PLATELET - Abnormal; Notable for the following components:   RBC 2.93 (*)    Hemoglobin 7.4 (*)    HCT 25.4 (*)    MCH 25.3 (*)    MCHC 29.1 (*)    All other components within normal limits  BRAIN NATRIURETIC PEPTIDE - Abnormal; Notable for the following components:   B Natriuretic Peptide 135.3 (*)    All other components within normal limits  CBC - Abnormal; Notable for the following components:   RBC 2.81 (*)    Hemoglobin 7.3 (*)    HCT 24.4 (*)    MCHC 29.9 (*)    All other components within normal limits  POC OCCULT BLOOD, ED - Abnormal; Notable for the following components:   Fecal Occult Bld POSITIVE (*)    All other components within normal limits  SARS CORONAVIRUS 2 (TAT 6-24 HRS)  PROTIME-INR  TYPE AND SCREEN  PREPARE RBC (CROSSMATCH)  TROPONIN I (HIGH SENSITIVITY)  TROPONIN I (HIGH SENSITIVITY)    EKG None  Radiology DG Chest 2 View  Result Date: 03/12/2020 CLINICAL DATA:  Short of breath. EXAM: CHEST - 2 VIEW COMPARISON:  08/18/2019 FINDINGS: Cardiac silhouette is top-normal in size. No mediastinal or hilar masses. No evidence of adenopathy. There are prominent bronchovascular markings, stable. Lungs otherwise clear. No pleural effusion or pneumothorax. Skeletal structures are demineralized. Mild vertebral compression deformity at the thoracolumbar junction, stable. IMPRESSION: No acute cardiopulmonary disease. Electronically Signed   By: Lajean Manes M.D.   On: 03/12/2020 09:41     Procedures Procedures (including critical care time)  Medications Ordered in ED Medications  0.9 %  sodium chloride infusion (has no administration in time range)  pantoprazole (PROTONIX) injection 40 mg (40 mg Intravenous Given 03/12/20 0944)    ED Course  I have reviewed the triage vital signs and the nursing notes.  Pertinent labs & imaging results that were available during my care of the patient were reviewed by me and considered in my medical decision making (see chart for details).  Clinical Course as of Mar 13 1211  Sun Mar 12, 2020  1101 Patient with newly found anemia and SOB presenting to the ED. Hemmocult positive. Likely upper GI bleed. Had transfusion 4 days ago for hemoglobin of 6.7.  Hemoglobin of 7.4 initially here in the ER. Patient was symptomatic with ambulation as well as dropping oxygen sats to 89% with ambulation. For this reason a consult for admission was placed. Spoke with Alonza Bogus from [facility] and they are aware of patient   [KM]    Clinical Course User Index [KM] Kristine Royal   MDM Rules/Calculators/A&P                      CRITICAL CARE Performed by: Alveria Apley   Total critical care time: 35 minutes  Critical care time was exclusive of separately billable procedures and treating other patients.  Critical care was necessary to treat or prevent imminent or life-threatening deterioration.  Critical care was time spent personally by me on the following activities: development of treatment plan with patient and/or surrogate as well as nursing, discussions with consultants, evaluation of patient's response to treatment, examination of patient, obtaining history from patient or surrogate, ordering and performing treatments and interventions, ordering and review of laboratory studies, ordering and review of radiographic studies, pulse oximetry and re-evaluation of patient's condition.  Final Clinical Impression(s) / ED  Diagnoses Final diagnoses:  SOB (shortness of breath)  Upper GI bleed  Symptomatic anemia    Rx / DC Orders ED Discharge Orders    None       Kristine Royal 03/12/20 1212    Tegeler, Gwenyth Allegra, MD 03/12/20 1721

## 2020-03-12 NOTE — H&P (View-Only) (Signed)
Referring Provider:  EDP Primary Care Physician:  [name], MD Primary Gastroenterologist:  Althia Forts  Reason for Consultation:  Symptomatic anemia and heme positive stool  HPI: [name] is a 86 y.o. female with past medical history significant for hypothyroidism, hypertension, CHF, chronic kidney disease stage III, hyperlipidemia who presented to [facility] with complaints of weakness and black stools.  Was supposed to see me in the office this week for anemia.  Hgb 6 months ago was 11.8 grams.  6.7 grams six days ago and was transfused as outpatient with just one unit PRBC's.  Was told to come to the ED for any recurrent SOB, dizzy, etc.  She felt SOB again yesterday and complained of some abdominal pain.  They say stools have been black even prior to recently prescribed iron supplements.  Today 7.3 grams.  Heme positive.  They are going to give her one unit of PRBC's for now in the ED.  Says that she tends to be constipated of late.  Had a good bowel movement since she has been in the ED and denies abdominal pain at this time.    No blood thinners or NSAID's.  Remote colonoscopy.  ? EGD in the past.  She reports bleeding ulcer 40+ years ago.  Daughter at bedside during my visit.   Past Medical History:  Diagnosis Date  . Arthritis   . Depression   . History of anemia   . History of chicken pox   . History of ulcer disease 1990s  . HLD (hyperlipidemia)   . HTN (hypertension)   . Hypothyroidism   . Osteoporosis 03/2016   at [facility] - T -3.1 hip, -1.0 spine  . Uses hearing aid     Past Surgical History:  Procedure Laterality Date  . APPENDECTOMY  1974  . BACK SURGERY  1979  . CARDIOVASCULAR STRESS TEST  2010   WNL  . CARPAL TUNNEL RELEASE Left 04/28/2017  . CATARACT EXTRACTION Bilateral 2012  . DEXA  03/2016   at [facility] - T -3.1 hip, -1.0 spine  . GALLBLADDER SURGERY  1974  . [place]   right  . [place]   left  . KNEE SURGERY   2008  . TONSILLECTOMY  1945  . US ECHOCARDIOGRAPHY  2010   LVEF 65-75%, overall normal, mildly decreased LV diastolic compliance    Prior to Admission medications   Medication Sig Start Date End Date Taking? Authorizing Provider  acetaminophen (TYLENOL) 500 MG tablet Take 1,000 mg by mouth daily as needed.     [provider]  cholecalciferol (VITAMIN D) 1000 UNITS tablet Take 1,000 Units by mouth daily.    [provider]  denosumab (PROLIA) 60 MG/ML SOSY injection Inject 60 mg into the skin every 6 (six) months. 12/06/19   [name], MD  diphenhydramine-acetaminophen (TYLENOL PM) 25-500 MG TABS Take 2 tablets by mouth at bedtime as needed.    [provider]  famotidine (PEPCID) 20 MG tablet Take 20 mg by mouth daily.    [provider]  ferrous sulfate 325 (65 FE) MG tablet Take 1 tablet (325 mg total) by mouth daily with breakfast. 03/08/20   [name], MD  FLUoxetine (PROZAC) 40 MG capsule Take 1 capsule (40 mg total) by mouth daily. 02/23/20   [name], MD  furosemide (LASIX) 20 MG tablet Take 10 mg by mouth daily.    Almyra Deforest, [place]  levothyroxine (SYNTHROID) 88 MCG tablet  TAKE 1 TABLET BY MOUTH EVERY DAY BEFORE BREAKFAST 08/24/19   [name], MD  lisinopril (ZESTRIL) 40 MG tablet TAKE 1 TABLET BY MOUTH TWICE DAILY 08/24/19   [name], MD  loratadine (CLARITIN) 10 MG tablet Take 10 mg by mouth daily.    [provider]  mirabegron ER (MYRBETRIQ) 25 MG TB24 tablet Take 1 tablet (25 mg total) by mouth daily. 12/20/19   [name], MD  omeprazole (PRILOSEC) 40 MG capsule Take 1 capsule (40 mg total) by mouth daily. 03/04/20   [name], MD  pravastatin (PRAVACHOL) 40 MG tablet TAKE 1 TABLET BY MOUTH EVERY DAY 03/03/20   [name], MD  verapamil (CALAN-SR) 240 MG CR tablet Take 1 tablet (240 mg total) by mouth at bedtime. 12/17/19   Lorretta Harp, MD  vitamin B-12 (CYANOCOBALAMIN) 500 MCG  tablet Take 1 tablet (500 mcg total) by mouth once a week. 03/08/20   [name], MD    Current Facility-Administered Medications  Medication Dose Route Frequency Provider Last Rate Last Admin  . 0.9 %  sodium chloride infusion  10 mL/hr Intravenous Once Alveria Apley, PA-C       Current Outpatient Medications  Medication Sig Dispense Refill  . acetaminophen (TYLENOL) 500 MG tablet Take 1,000 mg by mouth daily as needed.     . cholecalciferol (VITAMIN D) 1000 UNITS tablet Take 1,000 Units by mouth daily.    Marland Kitchen denosumab (PROLIA) 60 MG/ML SOSY injection Inject 60 mg into the skin every 6 (six) months.    . diphenhydramine-acetaminophen (TYLENOL PM) 25-500 MG TABS Take 2 tablets by mouth at bedtime as needed.    . famotidine (PEPCID) 20 MG tablet Take 20 mg by mouth daily.    . ferrous sulfate 325 (65 FE) MG tablet Take 1 tablet (325 mg total) by mouth daily with breakfast.    . FLUoxetine (PROZAC) 40 MG capsule Take 1 capsule (40 mg total) by mouth daily. 90 capsule 3  . furosemide (LASIX) 20 MG tablet Take 10 mg by mouth daily.    Marland Kitchen levothyroxine (SYNTHROID) 88 MCG tablet TAKE 1 TABLET BY MOUTH EVERY DAY BEFORE BREAKFAST 90 tablet 2  . lisinopril (ZESTRIL) 40 MG tablet TAKE 1 TABLET BY MOUTH TWICE DAILY 180 tablet 2  . loratadine (CLARITIN) 10 MG tablet Take 10 mg by mouth daily.    . mirabegron ER (MYRBETRIQ) 25 MG TB24 tablet Take 1 tablet (25 mg total) by mouth daily. 30 tablet 3  . omeprazole (PRILOSEC) 40 MG capsule Take 1 capsule (40 mg total) by mouth daily. 30 capsule 3  . pravastatin (PRAVACHOL) 40 MG tablet TAKE 1 TABLET BY MOUTH EVERY DAY 90 tablet 2  . verapamil (CALAN-SR) 240 MG CR tablet Take 1 tablet (240 mg total) by mouth at bedtime. 90 tablet 1  . vitamin B-12 (CYANOCOBALAMIN) 500 MCG tablet Take 1 tablet (500 mcg total) by mouth once a week.      Allergies as of 03/12/2020  . (No Known Allergies)    Family History  Problem Relation Age of Onset  . Cancer  Mother 59       lung, nonsmoker  . Cancer Father        lung, nonsmoker  . Cancer Brother 43       lung, smoker  . Cancer Sister 30       brain  . Cancer Sister 45       lung, smoker  . COPD Sister  smoker  . Cancer Sister 36       colon  . Hypertension Daughter   . Diabetes Daughter   . CAD Neg Hx   . Stroke Neg Hx     Social History   Socioeconomic History  . Marital status: Widowed    Spouse name: Not on file  . Number of children: Not on file  . Years of education: Not on file  . Highest education level: Not on file  Occupational History  . Not on file  Tobacco Use  . Smoking status: Former Smoker    Quit date: 12/30/1992    Years since quitting: 27.2  . Smokeless tobacco: Never Used  Substance and Sexual Activity  . Alcohol use: No  . Drug use: No  . Sexual activity: Never  Other Topics Concern  . Not on file  Social History Narrative   Widower since 04-18-99 - husband battled cancer for 6 yrs, died from MI   Daughter Latrell Reitan) passed away 04/18/2015   Lies in retirement home - [facility] on [address].   Occupation: retired   HS: 12th grade   Activity: no regular exercise   Diet: good water, fruits/vegetables daily   Social Determinants of Health   Financial Resource Strain:   . Difficulty of Paying Living Expenses:   Food Insecurity:   . Worried About Charity fundraiser in the Last Year:   . Arboriculturist in the Last Year:   Transportation Needs:   . Film/video editor (Medical):   Marland Kitchen Lack of Transportation (Non-Medical):   Physical Activity:   . Days of Exercise per Week:   . Minutes of Exercise per Session:   Stress:   . Feeling of Stress :   Social Connections:   . Frequency of Communication with Friends and Family:   . Frequency of Social Gatherings with Friends and Family:   . Attends Religious Services:   . Active Member of Clubs or Organizations:   . Attends Archivist Meetings:   Marland Kitchen Marital Status:   Intimate Partner  Violence:   . Fear of Current or Ex-Partner:   . Emotionally Abused:   Marland Kitchen Physically Abused:   . Sexually Abused:     Review of Systems: ROS is O/W negative except as mentioned in HPI.  Physical Exam: Vital signs in last 24 hours: Temp:  [98 F (36.7 C)] 98 F (36.7 C) (03/14 0842) Pulse Rate:  [74-95] 74 (03/14 1130) Resp:  [17-23] 19 (03/14 1130) BP: (122-158)/(55-72) 122/55 (03/14 1130) SpO2:  04-17-88 %-98 %] 96 % (03/14 1130) Weight:  [99.8 kg-109 kg] 109 kg (03/14 0853)   General:  Alert, Well-developed, well-nourished, pleasant and cooperative in NAD Head:  Normocephalic and atraumatic. Eyes:  Sclera clear, no icterus.  Conjunctiva pink. Ears:  Very HOH. Mouth:  No deformity or lesions.   Lungs:  Clear throughout to auscultation.  No wheezes, crackles, or rhonchi.  Heart:  Regular rate and rhythm; no murmurs, clicks, rubs, or gallops. Abdomen:  Soft, non-distended.  BS present.  Non-tender.   Rectal:  Deferred.  Heme positive by EDP.  Msk:  Symmetrical without gross deformities. Pulses:  Normal pulses noted. Extremities:  Without clubbing or edema. Neurologic:  Alert and oriented x 4;  grossly normal neurologically. Skin:  Intact without significant lesions or rashes. Psych:  Alert and cooperative. Normal mood and affect.  Lab Results: Recent Labs    03/12/20 0909 03/12/20 1115  WBC 5.6 5.8  HGB 7.4* 7.3*  HCT 25.4* 24.4*  PLT 252 233   BMET Recent Labs    03/12/20 0909  NA 137  K 3.6  CL 106  CO2 23  GLUCOSE 93  BUN 28*  CREATININE 1.83*  CALCIUM 8.5*   LFT Recent Labs    03/12/20 0909  PROT 6.5  ALBUMIN 3.3*  AST 16  ALT 10  ALKPHOS 34*  BILITOT 0.8   PT/INR Recent Labs    03/12/20 0909  LABPROT 14.0  INR 1.1   Studies/Results: DG Chest 2 View  Result Date: 03/12/2020 CLINICAL DATA:  Short of breath. EXAM: CHEST - 2 VIEW COMPARISON:  08/18/2019 FINDINGS: Cardiac silhouette is top-normal in size. No mediastinal or hilar masses. No  evidence of adenopathy. There are prominent bronchovascular markings, stable. Lungs otherwise clear. No pleural effusion or pneumothorax. Skeletal structures are demineralized. Mild vertebral compression deformity at the thoracolumbar junction, stable. IMPRESSION: No acute cardiopulmonary disease. Electronically Signed   By: Lajean Manes M.D.   On: 03/12/2020 09:41   IMPRESSION:  *New symptomatic IDA: Hemoglobin 6 months ago was in the 11 g range.  Now recently with 6.7 g hemoglobin.  Transfused as an outpatient.  Continues to be short of breath and reported black stools, heme positive.  Hgb 7.3 grams here.  Going to receive one unit PRBC's here. *CKD stage 3   PLAN: *We will plan for both EGD and colonoscopy on Monday, March 15.  Clear liquids today then NPO after midnight. *Monitor hemoglobin and transfuse further if needed. *Pantoprazole 40 mg IV BID for now.   Laban Emperor. Zehr  03/12/2020, 1:01 PM   Attending physician's note   I have taken a history, examined the patient and reviewed the chart. I agree with the Advanced Practitioner's note, impression and recommendations.  Recent onset symptomatic iron def anemia, fecal heme occult positive will need to evaluate for source of GI blood loss. ?Melena Will need to exclude PUD, malignancy Schedule for EGD and colonoscopy  Transfuse prbc  To Hgb >7 Monitor daily CBC PPI BID  The risks and benefits as well as alternatives of endoscopic procedure(s) have been discussed and reviewed. All questions answered. The patient agrees to proceed.   Damaris Hippo , MD [phone]

## 2020-03-13 ENCOUNTER — Other Ambulatory Visit: Payer: Medicare HMO

## 2020-03-13 ENCOUNTER — Encounter (HOSPITAL_COMMUNITY)
Admission: EM | Disposition: A | Discharge status: Home / Self Care | Payer: Self-pay | Source: Home / Self Care | Attending: Internal Medicine

## 2020-03-13 ENCOUNTER — Inpatient Hospital Stay (HOSPITAL_COMMUNITY): Payer: Medicare HMO | Admitting: Anesthesiology

## 2020-03-13 ENCOUNTER — Other Ambulatory Visit: Payer: Self-pay | Admitting: Physician Assistant

## 2020-03-13 ENCOUNTER — Encounter (HOSPITAL_COMMUNITY): Payer: Self-pay | Admitting: Internal Medicine

## 2020-03-13 DIAGNOSIS — N184 Chronic kidney disease, stage 4 (severe): Secondary | ICD-10-CM

## 2020-03-13 DIAGNOSIS — D509 Iron deficiency anemia, unspecified: Secondary | ICD-10-CM

## 2020-03-13 DIAGNOSIS — R195 Other fecal abnormalities: Secondary | ICD-10-CM

## 2020-03-13 DIAGNOSIS — I5032 Chronic diastolic (congestive) heart failure: Secondary | ICD-10-CM

## 2020-03-13 DIAGNOSIS — D649 Anemia, unspecified: Secondary | ICD-10-CM

## 2020-03-13 DIAGNOSIS — I10 Essential (primary) hypertension: Secondary | ICD-10-CM

## 2020-03-13 HISTORY — PX: ESOPHAGOGASTRODUODENOSCOPY (EGD) WITH PROPOFOL: SHX5813

## 2020-03-13 HISTORY — PX: BIOPSY: SHX5522

## 2020-03-13 LAB — BPAM RBC
Blood Product Expiration Date: 202104112359
ISSUE DATE / TIME: 202103141331
Unit Type and Rh: 6200

## 2020-03-13 LAB — TYPE AND SCREEN
ABO/RH(D): A POS
Antibody Screen: NEGATIVE
Unit division: 0

## 2020-03-13 LAB — CBC
HCT: 29.7 % — ABNORMAL LOW (ref 36.0–46.0)
Hemoglobin: 9.2 g/dL — ABNORMAL LOW (ref 12.0–15.0)
MCH: 26.7 pg (ref 26.0–34.0)
MCHC: 31 g/dL (ref 30.0–36.0)
MCV: 86.1 fL (ref 80.0–100.0)
Platelets: 300 10*3/uL (ref 150–400)
RBC: 3.45 MIL/uL — ABNORMAL LOW (ref 3.87–5.11)
RDW: 14 % (ref 11.5–15.5)
WBC: 7.7 10*3/uL (ref 4.0–10.5)
nRBC: 0 % (ref 0.0–0.2)

## 2020-03-13 LAB — BASIC METABOLIC PANEL
Anion gap: 11 (ref 5–15)
BUN: 19 mg/dL (ref 8–23)
CO2: 22 mmol/L (ref 22–32)
Calcium: 9 mg/dL (ref 8.9–10.3)
Chloride: 109 mmol/L (ref 98–111)
Creatinine, Ser: 1.5 mg/dL — ABNORMAL HIGH (ref 0.44–1.00)
GFR calc Af Amer: 36 mL/min — ABNORMAL LOW (ref >60)
GFR calc non Af Amer: 31 mL/min — ABNORMAL LOW (ref >60)
Glucose, Bld: 97 mg/dL (ref 70–99)
Potassium: 3.7 mmol/L (ref 3.5–5.1)
Sodium: 142 mmol/L (ref 135–145)

## 2020-03-13 SURGERY — ESOPHAGOGASTRODUODENOSCOPY (EGD) WITH PROPOFOL
Anesthesia: Monitor Anesthesia Care

## 2020-03-13 MED ORDER — ACETAMINOPHEN-CODEINE #3 300-30 MG PO TABS
1.0000 | ORAL_TABLET | Freq: Every evening | ORAL | Status: DC | PRN
  Administered 2020-03-13: 1 via ORAL
  Filled 2020-03-13: qty 1

## 2020-03-13 MED ORDER — PEG-KCL-NACL-NASULF-NA ASC-C 100 G PO SOLR
0.5000 | Freq: Once | ORAL | Status: AC
  Administered 2020-03-13: 100 g via ORAL

## 2020-03-13 MED ORDER — PROPOFOL 500 MG/50ML IV EMUL
INTRAVENOUS | Status: DC | PRN
  Administered 2020-03-13: 100 ug/kg/min via INTRAVENOUS

## 2020-03-13 MED ORDER — PROPOFOL 10 MG/ML IV BOLUS
INTRAVENOUS | Status: DC | PRN
  Administered 2020-03-13 (×2): 20 mg via INTRAVENOUS

## 2020-03-13 MED ORDER — PEG-KCL-NACL-NASULF-NA ASC-C 100 G PO SOLR: 1.0000 | Freq: Once | ORAL | Status: DC

## 2020-03-13 MED ORDER — LACTATED RINGERS IV SOLN: INTRAVENOUS | Status: DC

## 2020-03-13 MED ORDER — SODIUM CHLORIDE 0.9 % IV SOLN: INTRAVENOUS | Status: DC

## 2020-03-13 MED ORDER — PEG-KCL-NACL-NASULF-NA ASC-C 100 G PO SOLR
0.5000 | Freq: Once | ORAL | Status: AC
  Administered 2020-03-14: 100 g via ORAL
  Filled 2020-03-13: qty 1

## 2020-03-13 SURGICAL SUPPLY — 25 items

## 2020-03-13 NOTE — Transfer of Care (Signed)
Immediate Anesthesia Transfer of Care Note  Patient: [name]  Procedure(s) Performed: ESOPHAGOGASTRODUODENOSCOPY (EGD) WITH PROPOFOL (N/A ) BIOPSY  Patient Location: Endoscopy Unit  Anesthesia Type:MAC  Level of Consciousness: awake, alert  and oriented  Airway & Oxygen Therapy: Patient Spontanous Breathing and Patient connected to nasal cannula oxygen  Post-op Assessment: Report given to RN, Post -op Vital signs reviewed and stable and Patient moving all extremities  Post vital signs: Reviewed and stable  Last Vitals:  Vitals Value Taken Time  BP 143/64 03/13/20 1011  Temp    Pulse 84 03/13/20 1011  Resp 24 03/13/20 1011  SpO2 99 % 03/13/20 1011  Vitals shown include unvalidated device data.  Last Pain:  Vitals:   03/13/20 1011  TempSrc:   PainSc: 0-No pain      Patients Stated Pain Goal: 0 (94/58/59 2924)  Complications: No apparent anesthesia complications

## 2020-03-13 NOTE — Op Note (Signed)
[facility] Patient Name: [name] Procedure Date : 03/13/2020 MRN: [id number] Attending MD: Ladene Artist , MD Date of Birth: [birth year] CSN: [id number] Age: 86 Admit Type: Inpatient Procedure:                Upper GI endoscopy Indications:              Unexplained iron deficiency anemia, Heme positive                            stool Providers:                Pricilla Riffle. Fuller Plan, MD, Jobe Igo, RN, Elspeth Cho Tech., Technician, Silas Flood, CRNA Referring MD:             [facility] Medicines:                Monitored Anesthesia Care Complications:            No immediate complications. Estimated Blood Loss:     Estimated blood loss was minimal. Procedure:                Pre-Anesthesia Assessment:                           - Prior to the procedure, a History and Physical                            was performed, and patient medications and                            allergies were reviewed. The patient's tolerance of                            previous anesthesia was also reviewed. The risks                            and benefits of the procedure and the sedation                            options and risks were discussed with the patient.                            All questions were answered, and informed consent                            was obtained. Prior Anticoagulants: The patient has                            taken no previous anticoagulant or antiplatelet                            agents. ASA Grade Assessment: III - A patient with  severe systemic disease. After reviewing the risks                            and benefits, the patient was deemed in                            satisfactory condition to undergo the procedure.                           After obtaining informed consent, the endoscope was                            passed under direct vision. Throughout the   procedure, the patient's blood pressure, pulse, and                            oxygen saturations were monitored continuously. The                            GIF-H190 ([id number]) Olympus gastroscope was                            introduced through the mouth, and advanced to the                            third part of duodenum. The upper GI endoscopy was                            accomplished without difficulty. The patient                            tolerated the procedure well. Findings:      The examined esophagus was normal.      A few dispersed medium linear erosions with stigmata of recent bleeding       (mild contact oozing) were found in the gastric body. Biopsies were       taken with a cold forceps for histology.      A small hiatal hernia was present.      The exam of the stomach was otherwise normal.      The duodenal bulb, second portion of the duodenum and third portion of       the duodenum were normal. Impression:               - Normal esophagus.                           - Erosive gastropathy with stigmata of recent                            bleeding. Biopsied.                           - Small hiatal hernia.                           - Normal duodenal bulb, second portion of the  duodenum and third portion of the duodenum. Recommendation:           - Return patient to hospital ward for ongoing care.                           - Clear liquid diet today.                           - Repeat bowel prep and reschedule colonoscopy for                            tomorrow.                           - IV Fe replacement per primary service.                           - Continue pantoprazole bid for now. Procedure Code(s):        --- Professional ---                           [phone], Esophagogastroduodenoscopy, flexible,                            transoral; with biopsy, single or multiple Diagnosis Code(s):        --- Professional ---                            K92.2, Gastrointestinal hemorrhage, unspecified                           K44.9, Diaphragmatic hernia without obstruction or                            gangrene                           D50.9, Iron deficiency anemia, unspecified                           R19.5, Other fecal abnormalities CPT copyright 2019 American Medical Association. All rights reserved. The codes documented in this report are preliminary and upon coder review may  be revised to meet current compliance requirements. Ladene Artist, MD 03/13/2020 10:10:37 AM This report has been signed electronically. Number of Addenda: 0

## 2020-03-13 NOTE — Anesthesia Postprocedure Evaluation (Signed)
Anesthesia Post Note  Patient: [name]  Procedure(s) Performed: ESOPHAGOGASTRODUODENOSCOPY (EGD) WITH PROPOFOL (N/A ) BIOPSY     Patient location during evaluation: Endoscopy Anesthesia Type: MAC Level of consciousness: awake and alert Pain management: pain level controlled Vital Signs Assessment: post-procedure vital signs reviewed and stable Respiratory status: spontaneous breathing, nonlabored ventilation, respiratory function stable and patient connected to nasal cannula oxygen Cardiovascular status: stable and blood pressure returned to baseline Postop Assessment: no apparent nausea or vomiting Anesthetic complications: no    Last Vitals:  Vitals:   03/13/20 1020 03/13/20 1024  BP: (!) 155/66   Pulse: 82 81  Resp: 20 (!) 21  Temp:    SpO2: 98% 98%    Last Pain:  Vitals:   03/13/20 1024  TempSrc:   PainSc: 0-No pain                 Catalina Gravel

## 2020-03-13 NOTE — Progress Notes (Signed)
[facility] PROGRESS NOTE    Progress Note  [name]  NLG:[id number] DOB: 1930-12-11 DOA: 03/12/2020 PCP: [name], MD     Brief Narrative:   [name] is an 86 y.o. female past medical history significant for hypothyroidism, diastolic heart failure, chronic kidney disease stage IV, presents with black tarry stools 6 months ago was 11.86 days ago it was 6.7 she was transfused 1 unit of packed red blood cells and outpatient 6 days prior to admission she was told to come to the ED if she felt symptomatic on the day of admission she was heme positive with a hemoglobin of 7.3 she was transfused an additional unit of packed red blood cells in the ED.  Assessment/Plan:   Acute upper GI bleeding/recurrent symptomatic anemia: She came symptomatic to the ED with a hemoglobin of 7.3 she was transfused 1 unit of packed red blood cells it came up to 9.2. Fluid resuscitated her blood pressure is stable test type and screen GI was consulted who recommended an EGD and colonoscopy on 03/13/2020 patient is currently n.p.o. She was started on IV Protonix and Zofran for nausea.  Stage IV chronic kidney disease: Continue conservative management unlikely to be a candidate for HD.  Essential hypertension: Continue verapamil hold lisinopril.  Hyperlipidemia: Continue Pravachol.  Hypothyroidism Continue Synthroid.  Chronic diastolic heart failure: Appears to be compensated hold Lasix, KVO IV fluids blood pressure is stable. MDD (major depressive disorder), recurrent episode, moderate ([place])    DVT prophylaxis: scd Family Communication:none Disposition Plan/Barrier to D/C:   Code Status:     Code Status Orders  (From admission, onward)         Start     Ordered   03/12/20 1314  Full code  Continuous     03/12/20 1317        Code Status History    This patient has a current code status but no historical code status.   Advance Care Planning Activity         IV Access:    Peripheral IV   Procedures and diagnostic studies:   DG Chest 2 View  Result Date: 03/12/2020 CLINICAL DATA:  Short of breath. EXAM: CHEST - 2 VIEW COMPARISON:  08/18/2019 FINDINGS: Cardiac silhouette is top-normal in size. No mediastinal or hilar masses. No evidence of adenopathy. There are prominent bronchovascular markings, stable. Lungs otherwise clear. No pleural effusion or pneumothorax. Skeletal structures are demineralized. Mild vertebral compression deformity at the thoracolumbar junction, stable. IMPRESSION: No acute cardiopulmonary disease. Electronically Signed   By: Lajean Manes M.D.   On: 03/12/2020 09:41     Medical Consultants:    None.  Anti-Infectives:   None  Subjective:    [name] she denies any abdominal pain, she does relate black starry stools this morning.  Objective:    Vitals:   03/12/20 1612 03/12/20 1745 03/13/20 0046 03/13/20 0515  BP: (!) 144/65 (!) 143/68 (!) 134/56 (!) 158/68  Pulse: 93 86 75 81  Resp: 18 20 18 16   Temp: 97.8 F (36.6 C) 97.6 F (36.4 C) 98.5 F (36.9 C) (!) 97.4 F (36.3 C)  TempSrc: Oral Oral Oral Oral  SpO2: 97% 99% 97% 99%  Weight:      Height:       SpO2: 99 %   Intake/Output Summary (Last 24 hours) at 03/13/2020 0713 Last data filed at 03/12/2020 1600 Gross per 24 hour  Intake 0 ml  Output --  Net 0  ml   Filed Weights   03/12/20 0843 03/12/20 0853  Weight: 99.8 kg 109 kg    Exam: General exam: In no acute distress, extremely hard of hearing. Respiratory system: Good air movement and clear to auscultation. Cardiovascular system: S1 & S2 heard, RRR. No JVD. Gastrointestinal system: Abdomen is nondistended, soft and nontender.  Extremities: No pedal edema. Skin: No rashes, lesions or ulcers Psychiatry: Judgement and insight appear normal. Mood & affect appropriate.    Data Reviewed:    Labs: Basic Metabolic Panel: Recent Labs  Lab 03/12/20 0909 03/13/20 0458   NA 137 142  K 3.6 3.7  CL 106 109  CO2 23 22  GLUCOSE 93 97  BUN 28* 19  CREATININE 1.83* 1.50*  CALCIUM 8.5* 9.0   GFR Estimated Creatinine Clearance: 31.9 mL/min (A) (by C-G formula based on SCr of 1.5 mg/dL (H)). Liver Function Tests: Recent Labs  Lab 03/12/20 0909  AST 16  ALT 10  ALKPHOS 34*  BILITOT 0.8  PROT 6.5  ALBUMIN 3.3*   No results for input(s): LIPASE, AMYLASE in the last 168 hours. No results for input(s): AMMONIA in the last 168 hours. Coagulation profile Recent Labs  Lab 03/12/20 0909  INR 1.1   COVID-19 Labs  No results for input(s): DDIMER, FERRITIN, LDH, CRP in the last 72 hours.  Lab Results  Component Value Date   [place] NEGATIVE 03/12/2020    CBC: Recent Labs  Lab 03/06/20 1421 03/12/20 0909 03/12/20 1115 03/12/20 2044 03/13/20 0458  WBC 6.0 5.6 5.8 7.4 7.7  NEUTROABS 3.9 3.6  --   --   --   HGB 6.7 Repeated and verified X2.* 7.4* 7.3* 9.8* 9.2*  HCT 20.9 Repeated and verified X2.* 25.4* 24.4* 31.9* 29.7*  MCV 80.8 86.7 86.8 85.5 86.1  PLT 262.0 252 233 294 300   Cardiac Enzymes: No results for input(s): CKTOTAL, CKMB, CKMBINDEX, TROPONINI in the last 168 hours. BNP (last 3 results) Recent Labs    09/01/19 1221  PROBNP 95.0   CBG: No results for input(s): GLUCAP in the last 168 hours. D-Dimer: No results for input(s): DDIMER in the last 72 hours. Hgb A1c: No results for input(s): HGBA1C in the last 72 hours. Lipid Profile: No results for input(s): CHOL, HDL, LDLCALC, TRIG, CHOLHDL, LDLDIRECT in the last 72 hours. Thyroid function studies: No results for input(s): TSH, T4TOTAL, T3FREE, THYROIDAB in the last 72 hours.  Invalid input(s): FREET3 Anemia work up: No results for input(s): VITAMINB12, FOLATE, FERRITIN, TIBC, IRON, RETICCTPCT in the last 72 hours. Sepsis Labs: Recent Labs  Lab 03/12/20 0909 03/12/20 1115 03/12/20 2044 03/13/20 0458  WBC 5.6 5.8 7.4 7.7   Microbiology Recent Results (from the  past 240 hour(s))  Fecal occult blood, imunochemical     Status: Abnormal   Collection Time: 03/08/20  3:24 PM   Specimen: Stool  Result Value Ref Range Status   Fecal Occult Bld Positive (A) Negative Final  SARS CORONAVIRUS 2 (TAT 6-24 HRS) Nasopharyngeal Nasopharyngeal Swab     Status: None   Collection Time: 03/12/20 11:35 AM   Specimen: Nasopharyngeal Swab  Result Value Ref Range Status   SARS Coronavirus 2 NEGATIVE NEGATIVE Final    Comment: (NOTE) SARS-CoV-2 target nucleic acids are NOT DETECTED. The SARS-CoV-2 RNA is generally detectable in upper and lower respiratory specimens during the acute phase of infection. Negative results do not preclude SARS-CoV-2 infection, do not rule out co-infections with other pathogens, and should not be used as the  sole basis for treatment or other patient management decisions. Negative results must be combined with clinical observations, patient history, and epidemiological information. The expected result is Negative. Fact Sheet for Patients: SugarRoll.be Fact Sheet for Healthcare Providers: https://www.woods-mathews.com/ This test is not yet approved or cleared by the [place] FDA and  has been authorized for detection and/or diagnosis of SARS-CoV-2 by FDA under an Emergency Use Authorization (EUA). This EUA will remain  in effect (meaning this test can be used) for the duration of the COVID-19 declaration under Section 56 4(b)(1) of the Act, 21 U.S.C. section 360bbb-3(b)(1), unless the authorization is terminated or revoked sooner. Performed at [facility], [place] [address], [place], [place] [address]      Medications:   . FLUoxetine  40 mg Oral Daily  . levothyroxine  88 mcg Oral Q0600  . loratadine  10 mg Oral Daily  . mirabegron ER  25 mg Oral Daily  . pantoprazole (PROTONIX) IV  40 mg Intravenous Q12H  . pravastatin  40 mg Oral Daily  . sodium chloride flush  3 mL  Intravenous Q12H  . verapamil  240 mg Oral QHS   Continuous Infusions: . lactated ringers        LOS: 1 day   Charlynne Cousins  [facility]  03/13/2020, 7:13 AM

## 2020-03-13 NOTE — Interval H&P Note (Signed)
History and Physical Interval Note:  03/13/2020 9:44 AM  [name]  has presented today for surgery, with the diagnosis of Anemia and heme positive stool.  The various methods of treatment have been discussed with the patient and family. After consideration of risks, benefits and other options for treatment, the patient has consented to  Procedure(s): ESOPHAGOGASTRODUODENOSCOPY (EGD) WITH PROPOFOL (N/A) as a surgical intervention.  The patient's history has been reviewed, patient examined, no change in status, stable for surgery.  I have reviewed the patient's chart and labs.  Questions were answered to the patient's satisfaction.     Pricilla Riffle. [place] Plan

## 2020-03-13 NOTE — Anesthesia Procedure Notes (Signed)
Procedure Name: MAC Date/Time: 03/13/2020 9:52 AM Performed by: Amadeo Garnet, CRNA Pre-anesthesia Checklist: Patient identified, Emergency Drugs available, Suction available and Patient being monitored Patient Re-evaluated:Patient Re-evaluated prior to induction Oxygen Delivery Method: Nasal cannula Preoxygenation: Pre-oxygenation with 100% oxygen Induction Type: IV induction Placement Confirmation: positive ETCO2 Dental Injury: Teeth and Oropharynx as per pre-operative assessment

## 2020-03-14 ENCOUNTER — Encounter (HOSPITAL_COMMUNITY)
Admission: EM | Disposition: A | Discharge status: Home / Self Care | Payer: Self-pay | Source: Home / Self Care | Attending: Internal Medicine

## 2020-03-14 ENCOUNTER — Inpatient Hospital Stay (HOSPITAL_COMMUNITY): Payer: Medicare HMO | Admitting: Anesthesiology

## 2020-03-14 ENCOUNTER — Other Ambulatory Visit: Payer: Self-pay

## 2020-03-14 ENCOUNTER — Encounter (HOSPITAL_COMMUNITY): Payer: Self-pay | Admitting: Internal Medicine

## 2020-03-14 DIAGNOSIS — K635 Polyp of colon: Secondary | ICD-10-CM

## 2020-03-14 DIAGNOSIS — K5521 Angiodysplasia of colon with hemorrhage: Secondary | ICD-10-CM

## 2020-03-14 DIAGNOSIS — E782 Mixed hyperlipidemia: Secondary | ICD-10-CM

## 2020-03-14 DIAGNOSIS — D122 Benign neoplasm of ascending colon: Secondary | ICD-10-CM

## 2020-03-14 DIAGNOSIS — D124 Benign neoplasm of descending colon: Secondary | ICD-10-CM

## 2020-03-14 DIAGNOSIS — D500 Iron deficiency anemia secondary to blood loss (chronic): Secondary | ICD-10-CM

## 2020-03-14 HISTORY — PX: COLONOSCOPY WITH PROPOFOL: SHX5780

## 2020-03-14 HISTORY — PX: HOT HEMOSTASIS: SHX5433

## 2020-03-14 HISTORY — PX: POLYPECTOMY: SHX5525

## 2020-03-14 LAB — SURGICAL PATHOLOGY

## 2020-03-14 SURGERY — COLONOSCOPY WITH PROPOFOL
Anesthesia: Monitor Anesthesia Care

## 2020-03-14 MED ORDER — LACTATED RINGERS IV SOLN
INTRAVENOUS | Status: AC | PRN
  Administered 2020-03-14: 1000 mL via INTRAVENOUS

## 2020-03-14 MED ORDER — PANTOPRAZOLE SODIUM 40 MG PO TBEC
40.0000 mg | DELAYED_RELEASE_TABLET | Freq: Every day | ORAL | Status: DC
  Administered 2020-03-14 – 2020-03-15 (×2): 40 mg via ORAL
  Filled 2020-03-14 (×2): qty 1

## 2020-03-14 MED ORDER — SODIUM CHLORIDE 0.9 % IV SOLN
500.0000 mg | Freq: Once | INTRAVENOUS | Status: AC
  Administered 2020-03-14: 500 mg via INTRAVENOUS
  Filled 2020-03-14 (×2): qty 10

## 2020-03-14 MED ORDER — HYDRALAZINE HCL 20 MG/ML IJ SOLN: 5.0000 mg | INTRAMUSCULAR | Status: DC | PRN

## 2020-03-14 MED ORDER — SODIUM CHLORIDE 0.9 % IV SOLN
25.0000 mg | Freq: Once | INTRAVENOUS | Status: AC
  Administered 2020-03-14: 25 mg via INTRAVENOUS
  Filled 2020-03-14: qty 0.5

## 2020-03-14 MED ORDER — PHENYLEPHRINE 40 MCG/ML (10ML) SYRINGE FOR IV PUSH (FOR BLOOD PRESSURE SUPPORT)
PREFILLED_SYRINGE | INTRAVENOUS | Status: DC | PRN
  Administered 2020-03-14: 80 ug via INTRAVENOUS
  Administered 2020-03-14: 40 ug via INTRAVENOUS

## 2020-03-14 MED ORDER — SODIUM CHLORIDE 0.9 % IV SOLN
500.0000 mg | Freq: Once | INTRAVENOUS | Status: DC
  Filled 2020-03-14: qty 10

## 2020-03-14 MED ORDER — LIDOCAINE 2% (20 MG/ML) 5 ML SYRINGE
INTRAMUSCULAR | Status: DC | PRN
  Administered 2020-03-14: 40 mg via INTRAVENOUS

## 2020-03-14 MED ORDER — PROPOFOL 500 MG/50ML IV EMUL
INTRAVENOUS | Status: DC | PRN
  Administered 2020-03-14: 75 ug/kg/min via INTRAVENOUS

## 2020-03-14 SURGICAL SUPPLY — 22 items

## 2020-03-14 NOTE — Transfer of Care (Signed)
Immediate Anesthesia Transfer of Care Note  Patient: [name]  Procedure(s) Performed: COLONOSCOPY WITH PROPOFOL (N/A ) POLYPECTOMY  Patient Location: Endoscopy Unit  Anesthesia Type:MAC  Level of Consciousness: awake, alert  and oriented  Airway & Oxygen Therapy: Patient Spontanous Breathing and Patient connected to face mask oxygen  Post-op Assessment: Report given to RN, Post -op Vital signs reviewed and stable and Patient moving all extremities X 4  Post vital signs: Reviewed and stable  Last Vitals:  Vitals Value Taken Time  BP 145/52 03/14/20 0955  Temp 36.2 C 03/14/20 0955  Pulse 80 03/14/20 0957  Resp 18 03/14/20 0957  SpO2 100 % 03/14/20 0957  Vitals shown include unvalidated device data.  Last Pain:  Vitals:   03/14/20 0955  TempSrc: Temporal  PainSc:       Patients Stated Pain Goal: 0 (54/49/20 1007)  Complications: No apparent anesthesia complications

## 2020-03-14 NOTE — Anesthesia Preprocedure Evaluation (Signed)
Anesthesia Evaluation  Patient identified by MRN, date of birth, ID band Patient awake    Reviewed: Allergy & Precautions, NPO status , Patient's Chart, lab work & pertinent test results  History of Anesthesia Complications Negative for: history of anesthetic complications  Airway Mallampati: I  TM Distance: >3 FB Neck ROM: Full    Dental  (+) Edentulous Upper, Edentulous Lower   Pulmonary neg pulmonary ROS, former smoker,    Pulmonary exam normal        Cardiovascular hypertension, Normal cardiovascular exam     Neuro/Psych PSYCHIATRIC DISORDERS Depression negative neurological ROS     GI/Hepatic Neg liver ROS, PUD,   Endo/Other  Hypothyroidism Morbid obesity  Renal/GU Renal InsufficiencyRenal disease  negative genitourinary   Musculoskeletal negative musculoskeletal ROS (+)   Abdominal   Peds  Hematology  (+) anemia ,   Anesthesia Other Findings  Echo 08/2019 - EF 60-65%, severe asymmetric LVH of basal septal wall, LVOT turbulence in systole without SAM, mild LA dilation.  Reproductive/Obstetrics                            Anesthesia Physical Anesthesia Plan  ASA: III  Anesthesia Plan: MAC   Post-op Pain Management:    Induction: Intravenous  PONV Risk Score and Plan: Propofol infusion, TIVA and Treatment may vary due to age or medical condition  Airway Management Planned: Natural Airway, Nasal Cannula and Simple Face Mask  Additional Equipment: None  Intra-op Plan:   Post-operative Plan:   Informed Consent: I have reviewed the patients History and Physical, chart, labs and discussed the procedure including the risks, benefits and alternatives for the proposed anesthesia with the patient or authorized representative who has indicated his/her understanding and acceptance.       Plan Discussed with:   Anesthesia Plan Comments:         Anesthesia Quick  Evaluation

## 2020-03-14 NOTE — Progress Notes (Addendum)
[facility] PROGRESS NOTE    Progress Note  [name]  JAS:[id number] DOB: May 03, 1930 DOA: 03/12/2020 PCP: Brittany Bush, MD     Brief Narrative:   [name] is an 86 y.o. female past medical history significant for hypothyroidism, diastolic heart failure, chronic kidney disease stage IV, presents with black tarry stools 6 months ago was 11.86 days ago it was 6.7 she was transfused 1 unit of packed red blood cells and outpatient 6 days prior to admission she was told to come to the ED if she felt symptomatic on the day of admission she was heme positive with a hemoglobin of 7.3 she was transfused an additional unit of packed red blood cells in the ED.  Assessment/Plan:   Acute upper GI bleeding/recurrent symptomatic anemia: She came symptomatic to the ED with a hemoglobin of 7.3 she was transfused 1 unit of packed red blood cells it came up to 9.2. today 9.4 Protonix change to oral. S/p EGD 3.15.2021 showed:Erosive gastropathy with stigmata of recent bleeding. Biopsied. Colonoscopy on 3.16.2021 showed A single bleeding colonic angioectasia. Treated with argon plasma. Two 5 to 7 mm polyps in the descending colon and in the ascending colon, removed with a cold snare. Resected and retrieved, follow up with GI as an outpatient. GI is convinced that the source of her bleeding is colonic. Patient with nausea. Getting IV iron, will monitor overnight  Stage IV chronic kidney disease: Continue conservative management unlikely to be a candidate for HD.  Essential hypertension: Continue verapamil hold lisinopril. Can resume lisinopril once nausea resolved. Use hydralazine IV prn.  Hyperlipidemia: Continue Pravachol.  Hypothyroidism Continue Synthroid.  Chronic diastolic heart failure: Appears to be compensated, resume IV lasix in am. Once her nausea has resolved. Fluids are KVO.  MDD (major depressive disorder), recurrent episode, moderate ([facility])    DVT  prophylaxis: scd Family Communication:none Disposition Plan/Barrier to D/C: Home in am, or when nausea improved.  Code Status:     Code Status Orders  (From admission, onward)         Start     Ordered   03/12/20 1314  Full code  Continuous     03/12/20 1317        Code Status History    This patient has a current code status but no historical code status.   Advance Care Planning Activity        IV Access:    Peripheral IV   Procedures and diagnostic studies:   No results found.   Medical Consultants:    None.  Anti-Infectives:   None  Subjective:    [name] complaining of nausea.  Objective:    Vitals:   03/14/20 1000 03/14/20 1010 03/14/20 1032 03/14/20 1320  BP: (!) 153/59 (!) 161/61 (!) 132/55 (!) 149/65  Pulse: 78 78 75 85  Resp: 19 20 18 19   Temp:   97.9 F (36.6 C) 98.4 F (36.9 C)  TempSrc:   Oral Oral  SpO2: 100% 100% 98% 100%  Weight:      Height:       SpO2: 100 %   Intake/Output Summary (Last 24 hours) at 03/14/2020 1549 Last data filed at 03/14/2020 1322 Gross per 24 hour  Intake 1037 ml  Output --  Net 1037 ml   Filed Weights   03/12/20 0843 03/12/20 0853 03/14/20 0813  Weight: 99.8 kg 109 kg 109 kg    Exam: General exam: In no acute distress. Respiratory system: Good air  movement and clear to auscultation. Cardiovascular system: S1 & S2 heard, RRR.  No appreciated JVD Gastrointestinal system: Positive bowel sounds soft nontender nondistended Extremities: No pedal edema. Skin: No rashes, lesions or ulcers Psychiatry: Judgement and insight appear normal. Mood & affect appropriate.     Data Reviewed:    Labs: Basic Metabolic Panel: Recent Labs  Lab 03/12/20 0909 03/13/20 0458  NA 137 142  K 3.6 3.7  CL 106 109  CO2 23 22  GLUCOSE 93 97  BUN 28* 19  CREATININE 1.83* 1.50*  CALCIUM 8.5* 9.0   GFR Estimated Creatinine Clearance: 31.9 mL/min (A) (by C-G formula based on SCr of 1.5 mg/dL  (H)). Liver Function Tests: Recent Labs  Lab 03/12/20 0909  AST 16  ALT 10  ALKPHOS 34*  BILITOT 0.8  PROT 6.5  ALBUMIN 3.3*   No results for input(s): LIPASE, AMYLASE in the last 168 hours. No results for input(s): AMMONIA in the last 168 hours. Coagulation profile Recent Labs  Lab 03/12/20 0909  INR 1.1   COVID-19 Labs  No results for input(s): DDIMER, FERRITIN, LDH, CRP in the last 72 hours.  Lab Results  Component Value Date   [place] NEGATIVE 03/12/2020    CBC: Recent Labs  Lab 03/12/20 0909 03/12/20 1115 03/12/20 2044 03/13/20 0458  WBC 5.6 5.8 7.4 7.7  NEUTROABS 3.6  --   --   --   HGB 7.4* 7.3* 9.8* 9.2*  HCT 25.4* 24.4* 31.9* 29.7*  MCV 86.7 86.8 85.5 86.1  PLT 252 233 294 300   Cardiac Enzymes: No results for input(s): CKTOTAL, CKMB, CKMBINDEX, TROPONINI in the last 168 hours. BNP (last 3 results) Recent Labs    09/01/19 1221  PROBNP 95.0   CBG: No results for input(s): GLUCAP in the last 168 hours. D-Dimer: No results for input(s): DDIMER in the last 72 hours. Hgb A1c: No results for input(s): HGBA1C in the last 72 hours. Lipid Profile: No results for input(s): CHOL, HDL, LDLCALC, TRIG, CHOLHDL, LDLDIRECT in the last 72 hours. Thyroid function studies: No results for input(s): TSH, T4TOTAL, T3FREE, THYROIDAB in the last 72 hours.  Invalid input(s): FREET3 Anemia work up: No results for input(s): VITAMINB12, FOLATE, FERRITIN, TIBC, IRON, RETICCTPCT in the last 72 hours. Sepsis Labs: Recent Labs  Lab 03/12/20 0909 03/12/20 1115 03/12/20 2044 03/13/20 0458  WBC 5.6 5.8 7.4 7.7   Microbiology Recent Results (from the past 240 hour(s))  Fecal occult blood, imunochemical     Status: Abnormal   Collection Time: 03/08/20  3:24 PM   Specimen: Stool  Result Value Ref Range Status   Fecal Occult Bld Positive (A) Negative Final  SARS CORONAVIRUS 2 (TAT 6-24 HRS) Nasopharyngeal Nasopharyngeal Swab     Status: None   Collection Time:  03/12/20 11:35 AM   Specimen: Nasopharyngeal Swab  Result Value Ref Range Status   SARS Coronavirus 2 NEGATIVE NEGATIVE Final    Comment: (NOTE) SARS-CoV-2 target nucleic acids are NOT DETECTED. The SARS-CoV-2 RNA is generally detectable in upper and lower respiratory specimens during the acute phase of infection. Negative results do not preclude SARS-CoV-2 infection, do not rule out co-infections with other pathogens, and should not be used as the sole basis for treatment or other patient management decisions. Negative results must be combined with clinical observations, patient history, and epidemiological information. The expected result is Negative. Fact Sheet for Patients: SugarRoll.be Fact Sheet for Healthcare Providers: https://www.woods-mathews.com/ This test is not yet approved or cleared by the [place]  States FDA and  has been authorized for detection and/or diagnosis of SARS-CoV-2 by FDA under an Emergency Use Authorization (EUA). This EUA will remain  in effect (meaning this test can be used) for the duration of the COVID-19 declaration under Section 56 4(b)(1) of the Act, 21 U.S.C. section 360bbb-3(b)(1), unless the authorization is terminated or revoked sooner. Performed at [facility], [place] [address] [place], [place] [address]      Medications:   . FLUoxetine  40 mg Oral Daily  . levothyroxine  88 mcg Oral Q0600  . loratadine  10 mg Oral Daily  . mirabegron ER  25 mg Oral Daily  . pantoprazole  40 mg Oral Q0600  . pravastatin  40 mg Oral Daily  . sodium chloride flush  3 mL Intravenous Q12H  . verapamil  240 mg Oral QHS   Continuous Infusions: . iron dextran (INFED/DEXFERRUM) infusion    . lactated ringers        LOS: 2 days   Charlynne Cousins  [facility]  03/14/2020, 3:49 PM

## 2020-03-14 NOTE — Progress Notes (Signed)
Patient returned from Endo.  

## 2020-03-14 NOTE — Anesthesia Postprocedure Evaluation (Signed)
Anesthesia Post Note  Patient: [name]  Procedure(s) Performed: COLONOSCOPY WITH PROPOFOL (N/A ) POLYPECTOMY HOT HEMOSTASIS (ARGON PLASMA COAGULATION/BICAP) (N/A )     Patient location during evaluation: Endoscopy Anesthesia Type: MAC Level of consciousness: awake and alert Pain management: pain level controlled Vital Signs Assessment: post-procedure vital signs reviewed and stable Respiratory status: spontaneous breathing, nonlabored ventilation and respiratory function stable Cardiovascular status: blood pressure returned to baseline and stable Postop Assessment: no apparent nausea or vomiting Anesthetic complications: no    Last Vitals:  Vitals:   03/14/20 1000 03/14/20 1010  BP: (!) 153/59 (!) 161/61  Pulse: 78 78  Resp: 19 20  Temp:    SpO2: 100% 100%    Last Pain:  Vitals:   03/14/20 1010  TempSrc:   PainSc: 0-No pain                 Lidia Collum

## 2020-03-14 NOTE — Op Note (Signed)
[facility] Patient Name: [name] Procedure Date : 03/14/2020 MRN: [id number] Attending MD: Jerene Bears , MD Date of Birth: 1930-06-22 CSN: [id number] Age: 86 Admit Type: Inpatient Procedure:                Colonoscopy Indications:              Heme positive stool, Iron deficiency anemia                            secondary to chronic blood loss Providers:                Lajuan Lines. Hilarie Fredrickson, MD, Benetta Spar, RN, Marguerita Merles, Technician Referring MD:             [facility] Medicines:                Monitored Anesthesia Care Complications:            No immediate complications. Estimated Blood Loss:     Estimated blood loss was minimal. Procedure:                Pre-Anesthesia Assessment:                           - Prior to the procedure, a History and Physical                            was performed, and patient medications and                            allergies were reviewed. The patient's tolerance of                            previous anesthesia was also reviewed. The risks                            and benefits of the procedure and the sedation                            options and risks were discussed with the patient.                            All questions were answered, and informed consent                            was obtained. Prior Anticoagulants: The patient has                            taken no previous anticoagulant or antiplatelet                            agents. ASA Grade Assessment: III - A patient with  severe systemic disease. After reviewing the risks                            and benefits, the patient was deemed in                            satisfactory condition to undergo the procedure.                           After obtaining informed consent, the colonoscope                            was passed under direct vision. Throughout the                             procedure, the patient's blood pressure, pulse, and                            oxygen saturations were monitored continuously. The                            PCF-H190DL ([id number] Olympus pediatric colonoscope                            was introduced through the anus and advanced to the                            cecum, identified by appendiceal orifice and                            ileocecal valve. The colonoscopy was performed                            without difficulty. The patient tolerated the                            procedure well. The quality of the bowel                            preparation was good. The ileocecal valve,                            appendiceal orifice, and rectum were photographed. Scope In: 9:21:37 AM Scope Out: 9:44:32 AM Scope Withdrawal Time: 0 hours 17 minutes 58 seconds  Total Procedure Duration: 0 hours 22 minutes 55 seconds  Findings:      Hemorrhoids were found on perianal exam.      A single small angioectasia with bleeding was found in the proximal       ascending colon. There was adherent clotted blood and when rinsed there       was active bleeding. Fulguration to stop the bleeding by argon plasma at       0.5 liters/minute and 20 watts was successful.      Two sessile polyps were found in the descending colon and ascending  colon. The polyps were 5 to 7 mm in size. These polyps were removed with       a cold snare. Resection and retrieval were complete.      External hemorrhoids were found during retroflexion and during digital       exam. The hemorrhoids were medium-sized. Impression:               - A single bleeding colonic angioectasia. Treated                            with argon plasma coagulation (APC).                           - Two 5 to 7 mm polyps in the descending colon and                            in the ascending colon, removed with a cold snare.                            Resected and retrieved.                            - External hemorrhoids. Moderate Sedation:      N/A Recommendation:           - Return patient to hospital ward for ongoing care.                           - Advance diet as tolerated.                           - Continue present medications.                           - IV iron replacement (1st dose ordered today).                           - Await pathology results.                           - Will need close attention to Hgb and iron studies                            as an outpatient to ensure improvement. If                            recurrent IDA would recommended video capsule                            endoscopy, which is not being pursed now given the                            finding of active bleeding angioectasia in the                            right colon (now treated).                           -  No recommendation at this time regarding repeat                            colonoscopy due to age. Procedure Code(s):        --- Professional ---                           [id number], 59, Colonoscopy, flexible; with control of                            bleeding, any method                           45385, Colonoscopy, flexible; with removal of                            tumor(s), polyp(s), or other lesion(s) by snare                            technique Diagnosis Code(s):        --- Professional ---                           K64.4, Residual hemorrhoidal skin tags                           K55.21, Angiodysplasia of colon with hemorrhage                           K63.5, Polyp of colon                           R19.5, Other fecal abnormalities                           D50.0, Iron deficiency anemia secondary to blood                            loss (chronic) CPT copyright 2019 American Medical Association. All rights reserved. The codes documented in this report are preliminary and upon coder review may  be revised to meet current compliance requirements. Jerene Bears,  MD 03/14/2020 9:57:45 AM This report has been signed electronically. Number of Addenda: 0

## 2020-03-14 NOTE — Anesthesia Procedure Notes (Signed)
Procedure Name: MAC Date/Time: 03/14/2020 9:11 AM Performed by: Kyung Rudd, CRNA Pre-anesthesia Checklist: Patient identified, Emergency Drugs available, Suction available and Patient being monitored Patient Re-evaluated:Patient Re-evaluated prior to induction Oxygen Delivery Method: Simple face mask Preoxygenation: Pre-oxygenation with 100% oxygen Induction Type: IV induction Placement Confirmation: positive ETCO2 Dental Injury: Teeth and Oropharynx as per pre-operative assessment

## 2020-03-14 NOTE — Procedures (Addendum)
I spoke to the patient's daughter, [name], after colonoscopy today and updated her on the findings and treatment plan.  She thanked me for the call.  I have asked our office to arrange a second dose of IV iron in 1 to 2 weeks after discharge, she will get first dose of IV iron today while hospitalized  Will need CBC and iron studies monitored after discharge by GI or primary care  GI will sign off for now, but call with questions

## 2020-03-14 NOTE — Interval H&P Note (Signed)
History and Physical Interval Note: For colonoscopy today to eval IDA with heme + stools.  Rather unremarkable EGD yesterday. HIGHER THAN BASELINE RISK.The nature of the procedure, as well as the risks, benefits, and alternatives were carefully and thoroughly reviewed with the patient. Ample time for discussion and questions allowed. The patient understood, was satisfied, and agreed to proceed.    03/14/2020 9:11 AM  [name]  has presented today for surgery, with the diagnosis of anemia and hemoccult positive stool.  The various methods of treatment have been discussed with the patient and family. After consideration of risks, benefits and other options for treatment, the patient has consented to  Procedure(s): COLONOSCOPY WITH PROPOFOL (N/A) as a surgical intervention.  The patient's history has been reviewed, patient examined, no change in status, stable for surgery.  I have reviewed the patient's chart and labs.  Questions were answered to the patient's satisfaction.     [name]

## 2020-03-14 NOTE — Progress Notes (Signed)
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 No Known Allergies  Patient Measurements: Height: 5\' 4"  (162.6 cm) Weight: 240 lb 4.8 oz (109 kg) IBW/kg (Calculated) : 54.7   Vital Signs: Temp: 97.9 F (36.6 C) (03/16 1032) Temp Source: Oral (03/16 1032) BP: 132/55 (03/16 1032) Pulse Rate: 75 (03/16 1032) Intake/Output from previous day: 03/15 0701 - 03/16 0700 In: 950 [P.O.:750; I.V.:200] Out: -  Intake/Output from this shift: Total I/O In: 250 [I.V.:250] Out: -   Labs: Recent Labs    03/12/20 0909 03/12/20 0909 03/12/20 1115 03/12/20 2044 03/13/20 0458  WBC 5.6   < > 5.8 7.4 7.7  HGB 7.4*   < > 7.3* 9.8* 9.2*  HCT 25.4*   < > 24.4* 31.9* 29.7*  PLT 252   < > 233 294 300  CREATININE 1.83*  --   --   --  1.50*  ALBUMIN 3.3*  --   --   --   --   PROT 6.5  --   --   --   --   AST 16  --   --   --   --   ALT 10  --   --   --   --   ALKPHOS 34*  --   --   --   --   BILITOT 0.8  --   --   --   --    < > = values in this interval not displayed.   Estimated Creatinine Clearance: 31.9 mL/min (A) (by C-G formula based on SCr of 1.5 mg/dL (H)).   Microbiology: Recent Results (from the past 720 hour(s))  Fecal occult blood, imunochemical     Status: Abnormal   Collection Time: 03/08/20  3:24 PM   Specimen: Stool  Result Value Ref Range Status   Fecal Occult Bld Positive (A) Negative Final  SARS CORONAVIRUS 2 (TAT 6-24 HRS) Nasopharyngeal Nasopharyngeal Swab     Status: None   Collection Time: 03/12/20 11:35 AM   Specimen: Nasopharyngeal Swab  Result Value Ref Range Status   SARS Coronavirus 2 NEGATIVE NEGATIVE Final    Comment: (NOTE) SARS-CoV-2 target nucleic acids are NOT DETECTED. The SARS-CoV-2 RNA is generally detectable in upper and lower respiratory specimens during the acute phase of infection. Negative results do not preclude SARS-CoV-2 infection, do not  rule out co-infections with other pathogens, and should not be used as the sole basis for treatment or other patient management decisions. Negative results must be combined with clinical observations, patient history, and epidemiological information. The expected result is Negative. Fact Sheet for Patients: SugarRoll.be Fact Sheet for Healthcare Providers: https://www.woods-mathews.com/ This test is not yet approved or cleared by the [place] FDA and  has been authorized for detection and/or diagnosis of SARS-CoV-2 by FDA under an Emergency Use Authorization (EUA). This EUA will remain  in effect (meaning this test can be used) for the duration of the COVID-19 declaration under Section 56 4(b)(1) of the Act, 21 U.S.C. section 360bbb-3(b)(1), unless the authorization is terminated or revoked sooner. Performed at [facility], [place] [address], [place], [place] [address]     Medical History: Past Medical History:  Diagnosis Date  . Arthritis   . Depression   . History of anemia   . History of chicken pox   . History of ulcer disease 1990s  . HLD (hyperlipidemia)   .  HTN (hypertension)   . Hypothyroidism   . Osteoporosis 03/2016   at [facility] - T -3.1 hip, -1.0 spine  . Uses hearing aid     Medications:  Medications Prior to Admission  Medication Sig Dispense Refill Last Dose  . acetaminophen (TYLENOL) 500 MG tablet Take 1,000 mg by mouth daily as needed.    unknown at unknown  . cholecalciferol (VITAMIN D) 1000 UNITS tablet Take 1,000 Units by mouth daily.   03/09/2020 at Unknown time  . denosumab (PROLIA) 60 MG/ML SOSY injection Inject 60 mg into the skin every 6 (six) months.   unknown at unknown  . diphenhydramine-acetaminophen (TYLENOL PM) 25-500 MG TABS Take 2 tablets by mouth at bedtime as needed (sleep).    03/11/2020 at Unknown time  . famotidine (PEPCID) 20 MG tablet Take 20 mg by mouth at bedtime.    03/11/2020 at Unknown  time  . ferrous sulfate 325 (65 FE) MG tablet Take 1 tablet (325 mg total) by mouth daily with breakfast.   03/12/2020 at Unknown time  . FLUoxetine (PROZAC) 40 MG capsule Take 1 capsule (40 mg total) by mouth daily. (Patient taking differently: Take 40 mg by mouth 2 (two) times daily. ) 90 capsule 3 03/12/2020 at Unknown time  . furosemide (LASIX) 20 MG tablet Take 10 mg by mouth every evening.    03/11/2020 at Unknown time  . levothyroxine (SYNTHROID) 88 MCG tablet TAKE 1 TABLET BY MOUTH EVERY DAY BEFORE BREAKFAST (Patient taking differently: Take 88 mcg by mouth daily before breakfast. ) 90 tablet 2 03/12/2020 at Unknown time  . lisinopril (ZESTRIL) 40 MG tablet TAKE 1 TABLET BY MOUTH TWICE DAILY (Patient taking differently: Take 40 mg by mouth in the morning and at bedtime. TAKE 1 TABLET BY MOUTH TWICE DAILY) 180 tablet 2 03/12/2020 at Unknown time  . loratadine (CLARITIN) 10 MG tablet Take 10 mg by mouth every evening.    03/11/2020 at Unknown time  . mirabegron ER (MYRBETRIQ) 25 MG TB24 tablet Take 1 tablet (25 mg total) by mouth daily. (Patient taking differently: Take 25 mg by mouth every evening. ) 30 tablet 3 03/11/2020 at Unknown time  . omeprazole (PRILOSEC) 40 MG capsule Take 1 capsule (40 mg total) by mouth daily. 30 capsule 3 03/12/2020 at Unknown time  . pravastatin (PRAVACHOL) 40 MG tablet TAKE 1 TABLET BY MOUTH EVERY DAY (Patient taking differently: Take 40 mg by mouth every evening. TAKE 1 TABLET BY MOUTH EVERY DAY) 90 tablet 2 03/11/2020 at Unknown time  . verapamil (CALAN-SR) 240 MG CR tablet Take 1 tablet (240 mg total) by mouth at bedtime. 90 tablet 1 03/11/2020 at Unknown time  . vitamin B-12 (CYANOCOBALAMIN) 500 MCG tablet Take 1 tablet (500 mcg total) by mouth once a week.   03/09/2020 at unknown   Scheduled:  . FLUoxetine  40 mg Oral Daily  . levothyroxine  88 mcg Oral Q0600  . loratadine  10 mg Oral Daily  . mirabegron ER  25 mg Oral Daily  . pantoprazole  40 mg Oral Q0600  .  pravastatin  40 mg Oral Daily  . sodium chloride flush  3 mL Intravenous Q12H  . verapamil  240 mg Oral QHS    Assessment: 86 y.o female presenting to ED on 03/12/20 with weakness, tarry stools.  Recurrent symptomatic anemia, UGI bleed.  Hgb 7.3 >9.8>9.2 S/p colonoscopy today 03/14/20.  GI has consulted pharmacy to dose and administer IV iron dextran for goal Hgb 13, for iron  deficiency anemia due to chronic GI blood loss.  For goal Hgb 13 g/dL , will need total of ~1000 mg Iron dextran.  Goal of Therapy:  Hgb 13 g/dL  Plan:  Give test dose of Iron dextran 25 mg IV x1 and observe 1 hour post test dose.  If no adverse effects, give Iron dextran 500 mg IV x1.   GI , Dr. Hilarie Fredrickson notes plan to administer second dose of IV iron dextran in 1-2 weeks as outpatient.    Nicole Cella, RPh Clinical Pharmacist Please check AMION for all [place] phone numbers After 10:00 PM, call [address] [phone]  03/14/2020,11:19 AM

## 2020-03-15 ENCOUNTER — Other Ambulatory Visit: Payer: Self-pay

## 2020-03-15 ENCOUNTER — Telehealth: Payer: Self-pay

## 2020-03-15 DIAGNOSIS — D500 Iron deficiency anemia secondary to blood loss (chronic): Secondary | ICD-10-CM

## 2020-03-15 DIAGNOSIS — E039 Hypothyroidism, unspecified: Secondary | ICD-10-CM

## 2020-03-15 DIAGNOSIS — N1832 Chronic kidney disease, stage 3b: Secondary | ICD-10-CM

## 2020-03-15 DIAGNOSIS — D649 Anemia, unspecified: Secondary | ICD-10-CM

## 2020-03-15 LAB — SURGICAL PATHOLOGY

## 2020-03-15 NOTE — Telephone Encounter (Signed)
-----   Message from Jerene Bears, MD sent at 03/14/2020 10:13 AM EDT ----- Pt hospitalized with IDA Found to have actively bleeding AVM in the right colon.  Treated with APC Getting IV iron in hospital x 1 Will need 2nd dose of IV iron in the next 1-2 weeks as an outpatient and then followup of Hgb and iron studies. I expect she will go home soon. Thanks [employer]

## 2020-03-15 NOTE — Discharge Summary (Signed)
Physician Discharge Summary  Brittany [name]:[id number] DOB: February 19, 1930 DOA: 03/12/2020  PCP: Brittany Bush, MD  Admit date: 03/12/2020 Discharge date: 03/15/2020  Recommendations for Outpatient Follow-up:  1. Symptomatic anemia thought secondary to lower GI bleed, iron deficiency anemia new.  Recommend consideration repeat IV iron in 1-2 weeks and close attention to hemoglobin follow-up iron studies.  Follow-up Information    Brittany, Lajuan Lines, MD. Schedule an appointment as soon as possible for a visit in 1 week(s).   Specialty: Gastroenterology Contact information: [address] [place] [address] [phone]            Discharge Diagnoses: Principal diagnosis is #1 1. Symptomatic iron deficiency anemia secondary to upper GI bleed, with associated dark stools, shortness of breath and generalized weakness.  2. Chronic diastolic CHF 3. CKD stage IIIb 4. Essential hypertension 5. Hypothyroidism   Discharge Condition: improved Disposition: return to independent living  Diet recommendation: heart healthy  Filed Weights   03/12/20 0843 03/12/20 0853 03/14/20 0813  Weight: 99.8 kg 109 kg 109 kg    History of present illness:  86 year old [name] PMH including CKD stage III, hypertension, hypothyroidism presented with shortness of breath and generalized weakness, symptomatic anemia.  Admitted for GI bleed evaluation, including evaluation of new iron deficiency anemia.  Hospital Course:  No obvious bleeding noted.  Hemoglobin stabilized after 1 unit PRBC.  Seen by gastroenterology with evaluation and recommendations as below.  Hospitalization was uncomplicated.  Symptomatic iron deficiency anemia secondary to upper GI bleed, with associated dark stools, shortness of breath and generalized weakness.  Reported PMH of ulcer disease of some kind.  Not on anticoagulants or NSAIDs.  Transfuse 1 unit PRBC with satisfactory improvement and hemoglobin has remained  stable. --Fecal occult blood positive.  EGD showed erosive gastropathy with stigmata of recent bleeding and colonoscopy showed single bleeding colonic angiectasia treated with APC. --Status post IV iron 3/16 x 1.  Plan for second dose of IV iron dextran in 1 to 2 weeks as an outpatient per GI. --Protonix 40 mg daily per GI --Close attention to hemoglobin and iron studies as an outpatient to ensure improvement. --If recurrent iron deficiency anemia, video capsule endoscopy recommended.  Chronic diastolic CHF.  Echocardiogram September 2020 with preserved EF but severe asymmetric LVH and impaired diastolic relaxation. --Appears stable.  Lower extremity edema is chronic per daughter.  Resume furosemide on discharge  CKD stage IIIb --Appears to be stable, at baseline.  Follow-up as an outpatient.  Essential hypertension --Continue lisinopril, furosemide  Hypothyroidism --Continue levothyroxine  Today's assessment: S: Feels well, eating breakfast, no complaints.  Ready for discharge. O: Vitals:  Vitals:   03/14/20 2338 03/15/20 0525  BP: (!) 147/66 132/61  Pulse: 79 69  Resp: 18 20  Temp: 98.6 F (37 C) 98 F (36.7 C)  SpO2: 95% 95%    Constitutional:  . Appears calm and comfortable Respiratory:  . CTA bilaterally, no w/r/r.  . Respiratory effort normal.  Cardiovascular:  . RRR, no m/r/g . 2+ bilateral LE extremity edema   Psychiatric:  . Mental status Mood and affect appropriate.  No new data   Discharge Instructions  Discharge Instructions    Diet - low sodium heart healthy   Complete by: As directed    Discharge instructions   Complete by: As directed    Call your physician or seek immediate medical attention for bleeding, fatigue, passing out, dizziness or worsening of condition.   Increase activity slowly   Complete  by: As directed      Allergies as of 03/15/2020   No Known Allergies     Medication List    STOP taking these medications   ferrous  sulfate 325 (65 FE) MG tablet     TAKE these medications   acetaminophen 500 MG tablet Commonly known as: TYLENOL Take 1,000 mg by mouth daily as needed.   cholecalciferol 1000 units tablet Commonly known as: VITAMIN D Take 1,000 Units by mouth daily.   denosumab 60 MG/ML Sosy injection Commonly known as: PROLIA Inject 60 mg into the skin every 6 (six) months.   diphenhydramine-acetaminophen 25-500 MG Tabs tablet Commonly known as: TYLENOL PM Take 2 tablets by mouth at bedtime as needed (sleep).   famotidine 20 MG tablet Commonly known as: PEPCID Take 20 mg by mouth at bedtime.   FLUoxetine 40 MG capsule Commonly known as: PROZAC Take 1 capsule (40 mg total) by mouth daily. What changed: when to take this   furosemide 20 MG tablet Commonly known as: LASIX Take 10 mg by mouth every evening.   levothyroxine 88 MCG tablet Commonly known as: SYNTHROID TAKE 1 TABLET BY MOUTH EVERY DAY BEFORE BREAKFAST What changed: See the new instructions.   lisinopril 40 MG tablet Commonly known as: ZESTRIL TAKE 1 TABLET BY MOUTH TWICE DAILY What changed:   how much to take  how to take this  when to take this   loratadine 10 MG tablet Commonly known as: CLARITIN Take 10 mg by mouth every evening.   mirabegron ER 25 MG Tb24 tablet Commonly known as: Myrbetriq Take 1 tablet (25 mg total) by mouth daily. What changed: when to take this   omeprazole 40 MG capsule Commonly known as: PRILOSEC Take 1 capsule (40 mg total) by mouth daily.   pravastatin 40 MG tablet Commonly known as: PRAVACHOL TAKE 1 TABLET BY MOUTH EVERY DAY What changed:   how much to take  how to take this  when to take this   verapamil 240 MG CR tablet Commonly known as: CALAN-SR Take 1 tablet (240 mg total) by mouth at bedtime.   vitamin B-12 500 MCG tablet Commonly known as: CYANOCOBALAMIN Take 1 tablet (500 mcg total) by mouth once a week.      No Known Allergies  The results of  significant diagnostics from this hospitalization (including imaging, microbiology, ancillary and laboratory) are listed below for reference.    Significant Diagnostic Studies: DG Chest 2 View  Result Date: 03/12/2020 CLINICAL DATA:  Short of breath. EXAM: CHEST - 2 VIEW COMPARISON:  08/18/2019 FINDINGS: Cardiac silhouette is top-normal in size. No mediastinal or hilar masses. No evidence of adenopathy. There are prominent bronchovascular markings, stable. Lungs otherwise clear. No pleural effusion or pneumothorax. Skeletal structures are demineralized. Mild vertebral compression deformity at the thoracolumbar junction, stable. IMPRESSION: No acute cardiopulmonary disease. Electronically Signed   By: Lajean Manes M.D.   On: 03/12/2020 09:41    Microbiology: Recent Results (from the past 240 hour(s))  Fecal occult blood, imunochemical     Status: Abnormal   Collection Time: 03/08/20  3:24 PM   Specimen: Stool  Result Value Ref Range Status   Fecal Occult Bld Positive (A) Negative Final  SARS CORONAVIRUS 2 (TAT 6-24 HRS) Nasopharyngeal Nasopharyngeal Swab     Status: None   Collection Time: 03/12/20 11:35 AM   Specimen: Nasopharyngeal Swab  Result Value Ref Range Status   SARS Coronavirus 2 NEGATIVE NEGATIVE Final    Comment: (NOTE)  SARS-CoV-2 target nucleic acids are NOT DETECTED. The SARS-CoV-2 RNA is generally detectable in upper and lower respiratory specimens during the acute phase of infection. Negative results do not preclude SARS-CoV-2 infection, do not rule out co-infections with other pathogens, and should not be used as the sole basis for treatment or other patient management decisions. Negative results must be combined with clinical observations, patient history, and epidemiological information. The expected result is Negative. Fact Sheet for Patients: SugarRoll.be Fact Sheet for Healthcare  Providers: https://www.woods-mathews.com/ This test is not yet approved or cleared by the [place] FDA and  has been authorized for detection and/or diagnosis of SARS-CoV-2 by FDA under an Emergency Use Authorization (EUA). This EUA will remain  in effect (meaning this test can be used) for the duration of the COVID-19 declaration under Section 56 4(b)(1) of the Act, 21 U.S.C. section 360bbb-3(b)(1), unless the authorization is terminated or revoked sooner. Performed at [facility], [address], [place], [place] [address]      Labs: Basic Metabolic Panel: Recent Labs  Lab 03/12/20 0909 03/13/20 0458  NA 137 142  K 3.6 3.7  CL 106 109  CO2 23 22  GLUCOSE 93 97  BUN 28* 19  CREATININE 1.83* 1.50*  CALCIUM 8.5* 9.0   Liver Function Tests: Recent Labs  Lab 03/12/20 0909  AST 16  ALT 10  ALKPHOS 34*  BILITOT 0.8  PROT 6.5  ALBUMIN 3.3*   CBC: Recent Labs  Lab 03/12/20 0909 03/12/20 1115 03/12/20 2044 03/13/20 0458  WBC 5.6 5.8 7.4 7.7  NEUTROABS 3.6  --   --   --   HGB 7.4* 7.3* 9.8* 9.2*  HCT 25.4* 24.4* 31.9* 29.7*  MCV 86.7 86.8 85.5 86.1  PLT 252 233 294 300    Recent Labs    03/12/20 0909  BNP 135.3*    Principal Problem:   Acute upper GI bleeding Active Problems:   Hypothyroidism   Hyperlipidemia   Hypertension   MDD (major depressive disorder), recurrent episode, moderate (HCC)   Symptomatic anemia   Chronic kidney disease, stage 4, severely decreased GFR (HCC)   Chronic diastolic CHF (congestive heart failure) (HCC)   Iron deficiency anemia   Occult blood in stools   Angiodysplasia of colon with hemorrhage   Benign neoplasm of ascending colon   Benign neoplasm of descending colon   Time coordinating discharge: 35 minutes  Signed:  Murray Hodgkins, MD  [facility]  03/15/2020, 11:26 AM

## 2020-03-15 NOTE — Telephone Encounter (Signed)
Left a message for the daughter to call me back about scheduling the 2nd Feraheme infusion. She will also need follow up labs.

## 2020-03-15 NOTE — Plan of Care (Addendum)
  Problem: Education: Goal: Knowledge of General Education information will improve Description: Including pain rating scale, medication(s)/side effects and non-pharmacologic comfort measures Outcome: Completed/Met   Problem: Clinical Measurements: Goal: Ability to maintain clinical measurements within normal limits will improve Outcome: Completed/Met Goal: Will remain free from infection Outcome: Completed/Met Goal: Diagnostic test results will improve Outcome: Completed/Met Goal: Respiratory complications will improve Outcome: Completed/Met Goal: Cardiovascular complication will be avoided Outcome: Completed/Met   Problem: Activity: Goal: Risk for activity intolerance will decrease Outcome: Completed/Met   Problem: Nutrition: Goal: Adequate nutrition will be maintained Outcome: Completed/Met   Problem: Coping: Goal: Level of anxiety will decrease Outcome: Completed/Met   Problem: Elimination: Goal: Will not experience complications related to bowel motility Outcome: Completed/Met Goal: Will not experience complications related to urinary retention Outcome: Completed/Met   Problem: Pain Managment: Goal: General experience of comfort will improve Outcome: Completed/Met   Problem: Safety: Goal: Ability to remain free from injury will improve Outcome: Completed/Met   Problem: Skin Integrity: Goal: Risk for impaired skin integrity will decrease Outcome: Completed/Met   Discharge instructions reviewed with patient and her sister.  These included, but were not limited to, the following:  Medication list, activity recommendations, dietary recommendations, when to call the MD, follow-up appointments, safety initiatives, etc.  Patient discharged to previous living environment at the assisted living on [address].  Escorted to exit via wheelchair accompanied by volunteer.

## 2020-03-15 NOTE — Telephone Encounter (Signed)
Transition Care Management Follow-up Telephone Call  Date of discharge and from where: 03/15/2020[facility]  How have you been since you were released from the hospital? [name] (daughter) (DPR on file) states that patient is feeling better and doing well since getting home from the hospital.   Any questions or concerns? No   Items Reviewed:  Did the pt receive and understand the discharge instructions provided? Yes   Medications obtained and verified? Yes   Any new allergies since your discharge? No   Dietary orders reviewed? Yes  Do you have support at home? Yes   Functional Questionnaire: (I = Independent and D = Dependent) ADLs: I  Bathing/Dressing- I  Meal Prep- I  Eating- I  Maintaining continence- I  Transferring/Ambulation- I  Managing Meds- I  Follow up appointments reviewed:   PCP Hospital f/u appt confirmed? Yes  Scheduled to see Dr. Danise Mina on 03/20/2020 @ 10:30 am.  [facility] f/u appt confirmed? follow up with Dr. Hilarie Fredrickson in 1 week    Are transportation arrangements needed? No   If their condition worsens, is the pt aware to call PCP or go to the Emergency Dept.? Yes  Was the patient provided with contact information for the PCP's office or ED? Yes  Was to pt encouraged to call back with questions or concerns? Yes

## 2020-03-16 ENCOUNTER — Other Ambulatory Visit: Payer: Self-pay

## 2020-03-16 ENCOUNTER — Ambulatory Visit: Payer: Medicare HMO | Admitting: Gastroenterology

## 2020-03-16 ENCOUNTER — Ambulatory Visit: Payer: Medicare HMO

## 2020-03-16 NOTE — Telephone Encounter (Signed)
Referral to [facility] for the 2nd Feraheme infusion. Records faxed.

## 2020-03-16 NOTE — Telephone Encounter (Signed)
Spoke with the patient's daughter Mrs. Shelton. She has given me information on the better days and times for her mother's 2nd infusion.  The patient's insurance will require that she not be sent to the hospital for her infusion.

## 2020-03-16 NOTE — Telephone Encounter (Signed)
Pt's daughter called returning your call. Please call [phone].

## 2020-03-17 ENCOUNTER — Encounter: Payer: Self-pay | Admitting: Internal Medicine

## 2020-03-20 ENCOUNTER — Encounter: Payer: Self-pay | Admitting: Family Medicine

## 2020-03-20 ENCOUNTER — Ambulatory Visit (INDEPENDENT_AMBULATORY_CARE_PROVIDER_SITE_OTHER): Payer: Medicare HMO | Admitting: Family Medicine

## 2020-03-20 ENCOUNTER — Other Ambulatory Visit: Payer: Self-pay

## 2020-03-20 VITALS — BP 140/62 | HR 72 | Temp 97.8°F | Ht 64.0 in | Wt 234.0 lb

## 2020-03-20 DIAGNOSIS — D649 Anemia, unspecified: Secondary | ICD-10-CM | POA: Diagnosis not present

## 2020-03-20 DIAGNOSIS — K922 Gastrointestinal hemorrhage, unspecified: Secondary | ICD-10-CM

## 2020-03-20 DIAGNOSIS — D500 Iron deficiency anemia secondary to blood loss (chronic): Secondary | ICD-10-CM

## 2020-03-20 DIAGNOSIS — Z87898 Personal history of other specified conditions: Secondary | ICD-10-CM | POA: Diagnosis not present

## 2020-03-20 DIAGNOSIS — K5521 Angiodysplasia of colon with hemorrhage: Secondary | ICD-10-CM | POA: Diagnosis not present

## 2020-03-20 DIAGNOSIS — R0600 Dyspnea, unspecified: Secondary | ICD-10-CM | POA: Diagnosis not present

## 2020-03-20 DIAGNOSIS — R0609 Other forms of dyspnea: Secondary | ICD-10-CM

## 2020-03-20 DIAGNOSIS — N1832 Chronic kidney disease, stage 3b: Secondary | ICD-10-CM

## 2020-03-20 LAB — RENAL FUNCTION PANEL
Albumin: 3.5 g/dL (ref 3.5–5.2)
BUN: 15 mg/dL (ref 6–23)
CO2: 25 mEq/L (ref 19–32)
Calcium: 9.2 mg/dL (ref 8.4–10.5)
Chloride: 111 mEq/L (ref 96–112)
Creatinine, Ser: 1.19 mg/dL (ref 0.40–1.20)
GFR: 42.83 mL/min — ABNORMAL LOW (ref >60.00)
Glucose, Bld: 87 mg/dL (ref 70–99)
Phosphorus: 2 mg/dL — ABNORMAL LOW (ref 2.3–4.6)
Potassium: 4.2 mEq/L (ref 3.5–5.1)
Sodium: 140 mEq/L (ref 135–145)

## 2020-03-20 LAB — CBC WITH DIFFERENTIAL/PLATELET
Basophils Absolute: 0.1 10*3/uL (ref 0.0–0.1)
Basophils Relative: 1.3 % (ref 0.0–3.0)
Eosinophils Absolute: 0.2 10*3/uL (ref 0.0–0.7)
Eosinophils Relative: 3.7 % (ref 0.0–5.0)
HCT: 28.3 % — ABNORMAL LOW (ref 36.0–46.0)
Hemoglobin: 9.1 g/dL — ABNORMAL LOW (ref 12.0–15.0)
Lymphocytes Relative: 8.5 % — ABNORMAL LOW (ref 12.0–46.0)
Lymphs Abs: 0.5 10*3/uL — ABNORMAL LOW (ref 0.7–4.0)
MCHC: 32 g/dL (ref 30.0–36.0)
MCV: 85.4 fl (ref 78.0–100.0)
Monocytes Absolute: 0.6 10*3/uL (ref 0.1–1.0)
Monocytes Relative: 10.1 % (ref 3.0–12.0)
Neutro Abs: 4.7 10*3/uL (ref 1.4–7.7)
Neutrophils Relative %: 76.4 % (ref 43.0–77.0)
Platelets: 231 10*3/uL (ref 150.0–400.0)
RBC: 3.32 Mil/uL — ABNORMAL LOW (ref 3.87–5.11)
RDW: 17.5 % — ABNORMAL HIGH (ref 11.5–15.5)
WBC: 6.2 10*3/uL (ref 4.0–10.5)

## 2020-03-20 NOTE — Progress Notes (Signed)
This visit was conducted in person.  BP 140/62 (BP Location: Left Arm, Patient Position: Sitting, Cuff Size: Large)   Pulse 72   Temp 97.8 F (36.6 C) (Temporal)   Ht 5\' 4"  (1.626 m)   Wt 234 lb (106.1 kg)   SpO2 98%   BMI 40.17 kg/m    CC: hosp f/u visit  Subjective:    Patient ID: [name], female    DOB: Nov 06, 1930, 86 y.o.   MRN: [id number]  HPI: [name] is a 86 y.o. female presenting on 03/20/2020 for Hospitalization Follow-up (Pt accompanied by daughter, [name]- temp 97.6.)   Recent hospitalization for progressive dyspnea and orthostatic dizziness in setting of known new anemia. She underwent EGD (erosive gastropathy) and colonoscopy (active bleed from colonic angiectasia s/p APC).  She received 1u pRBC transfusion as well as iron infusion. Planned 2nd iron transfusion in the next 1-2 weeks then she will return to see GI.   Since home feeling well. Continues omeprazole and pepcid daily. Oral iron was discontinued.   Ongoing exertional dyspnea.  Generalized aches - takes scheduled tylenol 1000mg  bid for this.    Admit date: 03/12/2020 Discharge date: 03/15/2020 [facility] hosp f/u phone call completed 03/15/2020  Recommendations for Outpatient Follow-up:  1. Symptomatic anemia thought secondary to lower GI bleed, iron deficiency anemia new.  Recommend consideration repeat IV iron in 1-2 weeks and close attention to hemoglobin follow-up iron studies.  Discharge Diagnoses: Principal diagnosis is #1 2. Symptomatic iron deficiency anemia secondary to upper GI bleed, with associated dark stools, shortness of breath and generalized weakness.  3. Chronic diastolic CHF 4. CKD stageIIIb 5. Essential hypertension 6. Hypothyroidism   Discharge Condition: improved Disposition: return to independent living Diet recommendation: heart healthy     Relevant past medical, surgical, family and social history reviewed and updated as indicated. Interim medical history since our  last visit reviewed. Allergies and medications reviewed and updated. Outpatient Medications Prior to Visit  Medication Sig Dispense Refill  . acetaminophen (TYLENOL) 500 MG tablet Take 1,000 mg by mouth daily as needed.     . cholecalciferol (VITAMIN D) 1000 UNITS tablet Take 1,000 Units by mouth daily.    Marland Kitchen denosumab (PROLIA) 60 MG/ML SOSY injection Inject 60 mg into the skin every 6 (six) months.    . diphenhydramine-acetaminophen (TYLENOL PM) 25-500 MG TABS Take 2 tablets by mouth at bedtime as needed (sleep).     . famotidine (PEPCID) 20 MG tablet Take 20 mg by mouth at bedtime.     Marland Kitchen FLUoxetine (PROZAC) 40 MG capsule Take 1 capsule (40 mg total) by mouth daily. (Patient taking differently: Take 40 mg by mouth 2 (two) times daily. ) 90 capsule 3  . furosemide (LASIX) 20 MG tablet Take 0.5 tablets (10 mg total) by mouth daily.    Marland Kitchen levothyroxine (SYNTHROID) 88 MCG tablet TAKE 1 TABLET BY MOUTH EVERY DAY BEFORE BREAKFAST (Patient taking differently: Take 88 mcg by mouth daily before breakfast. ) 90 tablet 2  . lisinopril (ZESTRIL) 40 MG tablet TAKE 1 TABLET BY MOUTH TWICE DAILY (Patient taking differently: Take 40 mg by mouth in the morning and at bedtime. TAKE 1 TABLET BY MOUTH TWICE DAILY) 180 tablet 2  . loratadine (CLARITIN) 10 MG tablet Take 10 mg by mouth every evening.     . mirabegron ER (MYRBETRIQ) 25 MG TB24 tablet Take 1 tablet (25 mg total) by mouth daily. (Patient taking differently: Take 25 mg by mouth every evening. )  30 tablet 3  . omeprazole (PRILOSEC) 40 MG capsule Take 1 capsule (40 mg total) by mouth daily. 30 capsule 3  . pravastatin (PRAVACHOL) 40 MG tablet TAKE 1 TABLET BY MOUTH EVERY DAY (Patient taking differently: Take 40 mg by mouth every evening. TAKE 1 TABLET BY MOUTH EVERY DAY) 90 tablet 2  . verapamil (CALAN-SR) 240 MG CR tablet Take 1 tablet (240 mg total) by mouth at bedtime. 90 tablet 1  . vitamin B-12 (CYANOCOBALAMIN) 500 MCG tablet Take 1 tablet (500 mcg total)  by mouth once a week.    . furosemide (LASIX) 20 MG tablet Take 10 mg by mouth every evening.      No facility-administered medications prior to visit.     Per HPI unless specifically indicated in ROS section below Review of Systems Objective:    BP 140/62 (BP Location: Left Arm, Patient Position: Sitting, Cuff Size: Large)   Pulse 72   Temp 97.8 F (36.6 C) (Temporal)   Ht 5\' 4"  (1.626 m)   Wt 234 lb (106.1 kg)   SpO2 98%   BMI 40.17 kg/m   Wt Readings from Last 3 Encounters:  03/20/20 234 lb (106.1 kg)  03/14/20 240 lb 4.8 oz (109 kg)  03/08/20 230 lb (104.3 kg)    Physical Exam Vitals and nursing note reviewed.  Constitutional:      Appearance: Normal appearance. She is obese. She is not ill-appearing.  Eyes:     Extraocular Movements: Extraocular movements intact.     Pupils: Pupils are equal, round, and reactive to light.  Cardiovascular:     Rate and Rhythm: Normal rate and regular rhythm.     Pulses: Normal pulses.     Heart sounds: Normal heart sounds. No murmur.  Pulmonary:     Effort: Pulmonary effort is normal. No respiratory distress.     Breath sounds: Normal breath sounds. No wheezing, rhonchi or rales.  Abdominal:     General: Abdomen is flat. Bowel sounds are normal. There is no distension.     Palpations: Abdomen is soft. There is no mass.     Tenderness: There is no abdominal tenderness. There is no guarding or rebound.     Hernia: No hernia is present.  Musculoskeletal:     Right lower leg: No edema.     Left lower leg: No edema.  Skin:    Findings: Bruising (from recent blood draws R hand) present. No rash.  Neurological:     Mental Status: She is alert.  Psychiatric:        Mood and Affect: Mood normal.        Behavior: Behavior normal.       Results for orders placed or performed in visit on 03/20/20  Renal function panel  Result Value Ref Range   Sodium 140 135 - 145 mEq/L   Potassium 4.2 3.5 - 5.1 mEq/L   Chloride 111 96 - 112 mEq/L    CO2 25 19 - 32 mEq/L   Albumin 3.5 3.5 - 5.2 g/dL   BUN 15 6 - 23 mg/dL   Creatinine, Ser 1.19 0.40 - 1.20 mg/dL   Glucose, Bld 87 70 - 99 mg/dL   Phosphorus 2.0 (L) 2.3 - 4.6 mg/dL   GFR 42.83 (L) >60.00 mL/min   Calcium 9.2 8.4 - 10.5 mg/dL  CBC with Differential/Platelet  Result Value Ref Range   WBC 6.2 4.0 - 10.5 K/uL   RBC 3.32 (L) 3.87 - 5.11 Mil/uL  Hemoglobin 9.1 (L) 12.0 - 15.0 g/dL   HCT 28.3 (L) 36.0 - 46.0 %   MCV 85.4 78.0 - 100.0 fl   MCHC 32.0 30.0 - 36.0 g/dL   RDW 17.5 (H) 11.5 - 15.5 %   Platelets 231.0 150.0 - 400.0 K/uL   Neutrophils Relative % 76.4 43.0 - 77.0 %   Lymphocytes Relative 8.5 (L) 12.0 - 46.0 %   Monocytes Relative 10.1 3.0 - 12.0 %   Eosinophils Relative 3.7 0.0 - 5.0 %   Basophils Relative 1.3 0.0 - 3.0 %   Neutro Abs 4.7 1.4 - 7.7 K/uL   Lymphs Abs 0.5 (L) 0.7 - 4.0 K/uL   Monocytes Absolute 0.6 0.1 - 1.0 K/uL   Eosinophils Absolute 0.2 0.0 - 0.7 K/uL   Basophils Absolute 0.1 0.0 - 0.1 K/uL   Lab Results  Component Value Date   TSH 2.00 03/03/2020    Assessment & Plan:  This visit occurred during the SARS-CoV-2 public health emergency.  Safety protocols were in place, including screening questions prior to the visit, additional usage of staff PPE, and extensive cleaning of exam room while observing appropriate contact time as indicated for disinfecting solutions.   Problem List Items Addressed This Visit    Symptomatic anemia - Primary    S/p EGD and colonoscopy showing possible sources of bleed - likely lower GI related (angioectasia of vessel s/p APC). She receive total 2u pRBC this month - as well as iron infusion. Continues omeprazole and pepcid.  Update CBC.       Relevant Orders   CBC with Differential/Platelet (Completed)   Iron deficiency anemia   History of ulcer disease   Exertional dyspnea    Ongoing - anticipate will start improving after blood transfusions and iron infusions. Continue to monitor.       CKD  (chronic kidney disease) stage 3, GFR 30-59 ml/min    Improvement after recent transfusions. Anticipate related to hypovolemia. Continue to monitor.       Relevant Orders   Renal function panel (Completed)   CBC with Differential/Platelet (Completed)   Angiodysplasia of colon with hemorrhage    S/p APC. Appreciate GI care      Relevant Medications   furosemide (LASIX) 20 MG tablet   Acute upper GI bleeding    Appreciate GI care.           No orders of the defined types were placed in this encounter.  Orders Placed This Encounter  Procedures  . Renal function panel  . CBC with Differential/Platelet    Patient instructions: Labs today Continue omeprazole and pepcid daily.  I do want you to work on nutrition - 3 meals a day with protein source daily. If you don't get a source of protein in the meal, take 1/2-1 can of ensure or boost.  Return in 3 months for follow up visit.   Follow up plan: Return in about 3 months (around 06/20/2020) for follow up visit.  [name], MD

## 2020-03-20 NOTE — Patient Instructions (Addendum)
Labs today Continue omeprazole and pepcid daily.  I do want you to work on nutrition - 3 meals a day with protein source daily. If you don't get a source of protein in the meal, take 1/2-1 can of ensure or boost.  Return in 3 months for follow up visit.

## 2020-03-21 NOTE — Assessment & Plan Note (Signed)
Ongoing - anticipate will start improving after blood transfusions and iron infusions. Continue to monitor.

## 2020-03-21 NOTE — Assessment & Plan Note (Signed)
Appreciate GI care.  

## 2020-03-21 NOTE — Assessment & Plan Note (Addendum)
S/p EGD and colonoscopy showing possible sources of bleed - likely lower GI related (angioectasia of vessel s/p APC). She receive total 2u pRBC this month - as well as iron infusion. Continues omeprazole and pepcid.  Update CBC.

## 2020-03-21 NOTE — Assessment & Plan Note (Signed)
Improvement after recent transfusions. Anticipate related to hypovolemia. Continue to monitor.

## 2020-03-21 NOTE — Telephone Encounter (Signed)
Documentation of tried and failed iron or contraindication to oral iron requested by infusion center for the prior authorization.  No documentation of this information.  Notified them of this.

## 2020-03-21 NOTE — Assessment & Plan Note (Signed)
S/p APC. Appreciate GI care

## 2020-03-23 DIAGNOSIS — D509 Iron deficiency anemia, unspecified: Secondary | ICD-10-CM | POA: Diagnosis not present

## 2020-03-23 NOTE — Telephone Encounter (Addendum)
Hold prolia for now, reassess at 3 mo f/u visit. plz schedule 3 mo f/u visit as well (I don't see it was scheduled)

## 2020-03-23 NOTE — Telephone Encounter (Signed)
Patient received her Feraheme infusion today.

## 2020-03-27 NOTE — Telephone Encounter (Signed)
Tried pt's number but no answer and VM is full. LVM on daughter's number.   Hold on Prolia but pt does need a 3 month f/u visit with Dr. Darnell Level. Please schedule pt.

## 2020-03-28 ENCOUNTER — Telehealth: Payer: Self-pay

## 2020-03-28 NOTE — Telephone Encounter (Signed)
[place] Night - Client Nonclinical Telephone Record AccessNurse Client [place] Night - Client Client Site [place] Physician [name] - MD Contact Type Call Who Is Calling Patient / Member / Family / Caregiver Caller Name [name] Phone Number [phone] Call Type Message Only Information Provided Reason for Call Returning a Call from the Office Initial [name] states she is returning a call about her mother. Additional Comment Provided caller with office hours from profile. Disp. Time Disposition Final User 03/27/2020 6:04:01 PM General Information Provided Yes [name] Call Closed By: [name] Transaction Date/Time: 03/27/2020 6:01:56 PM (ET)

## 2020-03-28 NOTE — Telephone Encounter (Signed)
Spoke with pt's daughter, [name] (on dpr), informing her I did not see any messages showing someone from our office was trying to contact her nor pt.  I did confirm that she saw recent lab results in pt's MyChart.  Confirms she did and verbalizes understanding.

## 2020-03-30 NOTE — Telephone Encounter (Signed)
Valla Leaver, pt's daughter, and made lab apt. [name] verbalized understanding.

## 2020-04-18 ENCOUNTER — Other Ambulatory Visit: Payer: Self-pay | Admitting: Family Medicine

## 2020-05-17 ENCOUNTER — Encounter: Payer: Self-pay | Admitting: Family Medicine

## 2020-05-17 ENCOUNTER — Other Ambulatory Visit: Payer: Self-pay | Admitting: Family Medicine

## 2020-05-17 NOTE — Telephone Encounter (Signed)
E-scribed refills.  

## 2020-06-10 ENCOUNTER — Other Ambulatory Visit: Payer: Self-pay | Admitting: Cardiovascular Disease

## 2020-06-26 ENCOUNTER — Other Ambulatory Visit: Payer: Self-pay | Admitting: Family Medicine

## 2020-06-26 ENCOUNTER — Other Ambulatory Visit: Payer: Self-pay | Admitting: Cardiovascular Disease

## 2020-06-27 ENCOUNTER — Other Ambulatory Visit: Payer: Self-pay | Admitting: Family Medicine

## 2020-07-09 ENCOUNTER — Other Ambulatory Visit: Payer: Self-pay | Admitting: Family Medicine

## 2020-07-09 DIAGNOSIS — D500 Iron deficiency anemia secondary to blood loss (chronic): Secondary | ICD-10-CM

## 2020-07-09 DIAGNOSIS — N1832 Chronic kidney disease, stage 3b: Secondary | ICD-10-CM

## 2020-07-10 ENCOUNTER — Other Ambulatory Visit (INDEPENDENT_AMBULATORY_CARE_PROVIDER_SITE_OTHER): Payer: Medicare HMO

## 2020-07-10 ENCOUNTER — Other Ambulatory Visit: Payer: Self-pay

## 2020-07-10 DIAGNOSIS — D509 Iron deficiency anemia, unspecified: Secondary | ICD-10-CM

## 2020-07-10 DIAGNOSIS — D500 Iron deficiency anemia secondary to blood loss (chronic): Secondary | ICD-10-CM | POA: Diagnosis not present

## 2020-07-10 DIAGNOSIS — N1832 Chronic kidney disease, stage 3b: Secondary | ICD-10-CM | POA: Diagnosis not present

## 2020-07-10 LAB — RENAL FUNCTION PANEL
Albumin: 3.8 g/dL (ref 3.5–5.2)
BUN: 20 mg/dL (ref 6–23)
CO2: 30 mEq/L (ref 19–32)
Calcium: 10 mg/dL (ref 8.4–10.5)
Chloride: 103 mEq/L (ref 96–112)
Creatinine, Ser: 1.64 mg/dL — ABNORMAL HIGH (ref 0.40–1.20)
GFR: 29.56 mL/min — ABNORMAL LOW (ref >60.00)
Glucose, Bld: 81 mg/dL (ref 70–99)
Phosphorus: 3.3 mg/dL (ref 2.3–4.6)
Potassium: 4.1 mEq/L (ref 3.5–5.1)
Sodium: 139 mEq/L (ref 135–145)

## 2020-07-10 LAB — CBC WITH DIFFERENTIAL/PLATELET
Basophils Absolute: 0.1 10*3/uL (ref 0.0–0.1)
Basophils Relative: 1.5 % (ref 0.0–3.0)
Eosinophils Absolute: 0.1 10*3/uL (ref 0.0–0.7)
Eosinophils Relative: 2.8 % (ref 0.0–5.0)
HCT: 33.2 % — ABNORMAL LOW (ref 36.0–46.0)
Hemoglobin: 10.9 g/dL — ABNORMAL LOW (ref 12.0–15.0)
Lymphocytes Relative: 18.4 % (ref 12.0–46.0)
Lymphs Abs: 0.9 10*3/uL (ref 0.7–4.0)
MCHC: 33 g/dL (ref 30.0–36.0)
MCV: 90 fl (ref 78.0–100.0)
Monocytes Absolute: 0.6 10*3/uL (ref 0.1–1.0)
Monocytes Relative: 11.3 % (ref 3.0–12.0)
Neutro Abs: 3.3 10*3/uL (ref 1.4–7.7)
Neutrophils Relative %: 66 % (ref 43.0–77.0)
Platelets: 212 10*3/uL (ref 150.0–400.0)
RBC: 3.69 Mil/uL — ABNORMAL LOW (ref 3.87–5.11)
RDW: 13.7 % (ref 11.5–15.5)
WBC: 5.1 10*3/uL (ref 4.0–10.5)

## 2020-07-10 LAB — IBC PANEL
Iron: 43 ug/dL (ref 42–145)
Saturation Ratios: 14.8 % — ABNORMAL LOW (ref 20.0–50.0)
Transferrin: 207 mg/dL — ABNORMAL LOW (ref 212.0–360.0)

## 2020-07-10 LAB — FERRITIN: Ferritin: 111.1 ng/mL (ref 10.0–291.0)

## 2020-07-12 ENCOUNTER — Other Ambulatory Visit: Payer: Self-pay

## 2020-07-12 MED ORDER — OMEPRAZOLE 40 MG PO CPDR: DELAYED_RELEASE_CAPSULE | ORAL | 0 refills | Status: DC

## 2020-07-12 MED ORDER — LISINOPRIL 40 MG PO TABS: 40.0000 mg | ORAL_TABLET | Freq: Two times a day (BID) | ORAL | 0 refills | Status: DC

## 2020-07-12 MED ORDER — PRAVASTATIN SODIUM 40 MG PO TABS: ORAL_TABLET | ORAL | 0 refills | Status: DC

## 2020-07-12 MED ORDER — LEVOTHYROXINE SODIUM 88 MCG PO TABS: ORAL_TABLET | ORAL | 0 refills | Status: DC

## 2020-07-12 MED ORDER — FLUOXETINE HCL 40 MG PO CAPS: 40.0000 mg | ORAL_CAPSULE | Freq: Every day | ORAL | 0 refills | Status: DC

## 2020-07-12 MED ORDER — VERAPAMIL HCL ER 240 MG PO TBCR: 240.0000 mg | EXTENDED_RELEASE_TABLET | Freq: Every day | ORAL | 3 refills | Status: DC

## 2020-07-12 MED ORDER — MIRABEGRON ER 25 MG PO TB24: ORAL_TABLET | ORAL | 0 refills | Status: DC

## 2020-07-12 NOTE — Telephone Encounter (Signed)
Received faxed refill requests from [facility] mail order pharmacy.  E-scribed refills.

## 2020-07-13 ENCOUNTER — Telehealth: Payer: Self-pay

## 2020-07-13 NOTE — Telephone Encounter (Signed)
Pt's daughter called back. Scheduled pt for apt for tomorrow.

## 2020-07-13 NOTE — Telephone Encounter (Signed)
Pt needs 3 month f/u with Dr. Darnell Level. LVM with pt's daughter, [name].

## 2020-07-14 ENCOUNTER — Ambulatory Visit: Payer: Medicare HMO | Admitting: Family Medicine

## 2020-07-17 ENCOUNTER — Other Ambulatory Visit: Payer: Self-pay

## 2020-07-17 ENCOUNTER — Telehealth: Payer: Self-pay

## 2020-07-17 NOTE — Telephone Encounter (Signed)
Pt needs to be r/s'd for Prolia.  Pt was scheduled to have w/07-14-20 OV.

## 2020-07-17 NOTE — Telephone Encounter (Signed)
This is taken care of in the phone encounter from 7/15

## 2020-07-17 NOTE — Telephone Encounter (Signed)
Ca level normal, CrCl is 39.66mL/min. Pt missed apt with provider oin 7/16.  LVM for pt's daughter to call to RS OV.

## 2020-07-18 NOTE — Telephone Encounter (Signed)
Labs released via mychart.  Agree with OV to review vitals and discuss prolia - will likely proceed with prolia but would want calcium levels checked 10d after shot.  We had to cancel appt Friday because system was down.  plz call daughter to offer rescheduling.

## 2020-07-18 NOTE — Telephone Encounter (Signed)
Pt's daughter did return call yesterday and RS apt to 7/29.

## 2020-07-18 NOTE — Telephone Encounter (Signed)
Noted  

## 2020-07-27 ENCOUNTER — Encounter: Payer: Self-pay | Admitting: Family Medicine

## 2020-07-27 ENCOUNTER — Other Ambulatory Visit: Payer: Self-pay

## 2020-07-27 ENCOUNTER — Ambulatory Visit (INDEPENDENT_AMBULATORY_CARE_PROVIDER_SITE_OTHER): Payer: Medicare HMO | Admitting: Family Medicine

## 2020-07-27 VITALS — BP 136/74 | HR 72 | Temp 97.0°F | Ht 64.0 in | Wt 230.2 lb

## 2020-07-27 DIAGNOSIS — D500 Iron deficiency anemia secondary to blood loss (chronic): Secondary | ICD-10-CM | POA: Diagnosis not present

## 2020-07-27 DIAGNOSIS — K5521 Angiodysplasia of colon with hemorrhage: Secondary | ICD-10-CM | POA: Diagnosis not present

## 2020-07-27 DIAGNOSIS — R0600 Dyspnea, unspecified: Secondary | ICD-10-CM | POA: Diagnosis not present

## 2020-07-27 DIAGNOSIS — R0609 Other forms of dyspnea: Secondary | ICD-10-CM

## 2020-07-27 DIAGNOSIS — D649 Anemia, unspecified: Secondary | ICD-10-CM

## 2020-07-27 DIAGNOSIS — M810 Age-related osteoporosis without current pathological fracture: Secondary | ICD-10-CM | POA: Diagnosis not present

## 2020-07-27 DIAGNOSIS — N1832 Chronic kidney disease, stage 3b: Secondary | ICD-10-CM | POA: Diagnosis not present

## 2020-07-27 MED ORDER — DENOSUMAB 60 MG/ML ~~LOC~~ SOSY
60.0000 mg | PREFILLED_SYRINGE | Freq: Once | SUBCUTANEOUS | Status: AC
  Administered 2020-07-27: 60 mg via SUBCUTANEOUS

## 2020-07-27 MED ORDER — LORATADINE 10 MG PO TABS: 10.0000 mg | ORAL_TABLET | Freq: Every evening | ORAL | 3 refills | Status: DC

## 2020-07-27 NOTE — Patient Instructions (Addendum)
Continue current medicines.  Prolia shot today.  Schedule lab visit in 10 days to recheck kidneys and calcium levels.  Schedule 3 month physical and medicare wellness visit

## 2020-07-27 NOTE — Assessment & Plan Note (Signed)
Ongoing - to schedule cards f/u.

## 2020-07-27 NOTE — Telephone Encounter (Signed)
Noted  

## 2020-07-27 NOTE — Assessment & Plan Note (Signed)
Rpt Prolia injection today.  Discussed concerns with CKD after prolia - will schedule 10d lab visit for Cr and Ca.

## 2020-07-27 NOTE — Progress Notes (Signed)
This visit was conducted in person.  BP (!) 136/74 (BP Location: Left Arm, Patient Position: Sitting, Cuff Size: Large)    Pulse 72    Temp (!) 97 F (36.1 C) (Temporal)    Ht 5\' 4"  (1.626 m)    Wt (!) 230 lb 3 oz (104.4 kg)    SpO2 99%    BMI 39.51 kg/m   BP Readings from Last 3 Encounters:  07/27/20 (!) 136/74  03/20/20 140/62  03/15/20 (!) 154/73    CC: 3 mo f/u visit  Subjective:    Patient ID: [name], female    DOB: [birth year], 86 y.o.   MRN: [id number]  HPI: [name] is a 86 y.o. female presenting on 07/27/2020 for Follow-up (3 Month f/u)   Here with daughter today.  See recent lab result and prior OV.  Hospitalized for progressive dyspnea with orthostatic dizziness in setting of new anemia. EGD showed erosive gastropathy, colonoscopy showed active bleed from colonic angiectasia s/p APC treatment. She received 1u RBC transfusion as well as iron infusion x2. Continues omeprazole and pepcid daily.   No fevers/chills, cough, dysuria.   IDA - improvement with blood transfusion 02/2020 as well as iron infusion 02/2020. Staying short winded.   OP - monitoring kidney function and blood counts closely. Last prolia injection was 05/2019.  CKD - worsening noted recently (GFR 42 --> 29).      Relevant past medical, surgical, family and social history reviewed and updated as indicated. Interim medical history since our last visit reviewed. Allergies and medications reviewed and updated. Outpatient Medications Prior to Visit  Medication Sig Dispense Refill   acetaminophen (TYLENOL) 500 MG tablet Take 1,000 mg by mouth daily as needed.      cholecalciferol (VITAMIN D) 1000 UNITS tablet Take 1,000 Units by mouth daily.     denosumab (PROLIA) 60 MG/ML SOSY injection Inject 60 mg into the skin every 6 (six) months.     diphenhydramine-acetaminophen (TYLENOL PM) 25-500 MG TABS Take 2 tablets by mouth at bedtime as needed (sleep).      famotidine (PEPCID) 20 MG tablet Take  20 mg by mouth daily.     FLUoxetine (PROZAC) 40 MG capsule Take 1 capsule (40 mg total) by mouth daily. 90 capsule 0   furosemide (LASIX) 20 MG tablet TAKE 1/2 TABLET(10 MG) BY MOUTH DAILY 45 tablet 1   levothyroxine (SYNTHROID) 88 MCG tablet TAKE 1 TABLET BY MOUTH EVERY DAY BEFORE BREAKFAST 90 tablet 0   lisinopril (ZESTRIL) 40 MG tablet Take 1 tablet (40 mg total) by mouth 2 (two) times daily. 180 tablet 0   mirabegron ER (MYRBETRIQ) 25 MG TB24 tablet TAKE 1 TABLET(25 MG) BY MOUTH DAILY 90 tablet 0   omeprazole (PRILOSEC) 40 MG capsule TAKE 1 CAPSULE(40 MG) BY MOUTH DAILY 90 capsule 0   pravastatin (PRAVACHOL) 40 MG tablet TAKE 1 TABLET BY MOUTH EVERY DAY 90 tablet 0   verapamil (CALAN-SR) 240 MG CR tablet Take 1 tablet (240 mg total) by mouth daily. 90 tablet 3   vitamin B-12 (CYANOCOBALAMIN) 500 MCG tablet Take 1 tablet (500 mcg total) by mouth once a week.     loratadine (CLARITIN) 10 MG tablet Take 10 mg by mouth every evening.      famotidine (PEPCID) 20 MG tablet Take 20 mg by mouth at bedtime.      No facility-administered medications prior to visit.     Per HPI unless specifically indicated in ROS section below Review  of Systems Objective:  BP (!) 136/74 (BP Location: Left Arm, Patient Position: Sitting, Cuff Size: Large)    Pulse 72    Temp (!) 97 F (36.1 C) (Temporal)    Ht 5\' 4"  (1.626 m)    Wt (!) 230 lb 3 oz (104.4 kg)    SpO2 99%    BMI 39.51 kg/m   Wt Readings from Last 3 Encounters:  07/27/20 (!) 230 lb 3 oz (104.4 kg)  03/20/20 234 lb (106.1 kg)  03/14/20 240 lb 4.8 oz (109 kg)      Physical Exam Vitals and nursing note reviewed.  Constitutional:      Appearance: Normal appearance. She is obese. She is not ill-appearing.     Comments: Ambulates with walker  Cardiovascular:     Rate and Rhythm: Normal rate and regular rhythm.     Pulses: Normal pulses.     Heart sounds: Normal heart sounds. No murmur heard.   Pulmonary:     Effort: Pulmonary  effort is normal. No respiratory distress.     Breath sounds: Normal breath sounds. No wheezing, rhonchi or rales.  Musculoskeletal:     Right lower leg: Edema (nonpitting) present.     Left lower leg: Edema (nonpitting) present.  Neurological:     Mental Status: She is alert.  Psychiatric:        Mood and Affect: Mood normal.        Behavior: Behavior normal.       Results for orders placed or performed in visit on 07/10/20  CBC with Differential/Platelet  Result Value Ref Range   WBC 5.1 4.0 - 10.5 K/uL   RBC 3.69 (L) 3.87 - 5.11 Mil/uL   Hemoglobin 10.9 (L) 12.0 - 15.0 g/dL   HCT 33.2 (L) 36 - 46 %   MCV 90.0 78.0 - 100.0 fl   MCHC 33.0 30.0 - 36.0 g/dL   RDW 13.7 11.5 - 15.5 %   Platelets 212.0 150 - 400 K/uL   Neutrophils Relative % 66.0 43 - 77 %   Lymphocytes Relative 18.4 12 - 46 %   Monocytes Relative 11.3 3 - 12 %   Eosinophils Relative 2.8 0 - 5 %   Basophils Relative 1.5 0 - 3 %   Neutro Abs 3.3 1.4 - 7.7 K/uL   Lymphs Abs 0.9 0.7 - 4.0 K/uL   Monocytes Absolute 0.6 0 - 1 K/uL   Eosinophils Absolute 0.1 0 - 0 K/uL   Basophils Absolute 0.1 0 - 0 K/uL  IBC panel  Result Value Ref Range   Iron 43 42 - 145 ug/dL   Transferrin 207.0 (L) 212.0 - 360.0 mg/dL   Saturation Ratios 14.8 (L) 20.0 - 50.0 %  Ferritin  Result Value Ref Range   Ferritin 111.1 10.0 - 291.0 ng/mL  Renal function panel  Result Value Ref Range   Sodium 139 135 - 145 mEq/L   Potassium 4.1 3.5 - 5.1 mEq/L   Chloride 103 96 - 112 mEq/L   CO2 30 19 - 32 mEq/L   Albumin 3.8 3.5 - 5.2 g/dL   BUN 20 6 - 23 mg/dL   Creatinine, Ser 1.64 (H) 0.40 - 1.20 mg/dL   Glucose, Bld 81 70 - 99 mg/dL   Phosphorus 3.3 2.3 - 4.6 mg/dL   GFR 29.56 (L) >60.00 mL/min   Calcium 10.0 8.4 - 10.5 mg/dL   Assessment & Plan:  This visit occurred during the SARS-CoV-2 public health emergency.  Safety protocols  were in place, including screening questions prior to the visit, additional usage of staff PPE, and  extensive cleaning of exam room while observing appropriate contact time as indicated for disinfecting solutions.   Problem List Items Addressed This Visit    Symptomatic anemia    Significant improvement after blood transfusion then iron infusions x2. Continues omeprazole 40mg  daily, pepcid 20mg  daily. Still notes some ongoing dyspnea - rec schedule f/u with cards.       Osteoporosis - Primary    Rpt Prolia injection today.  Discussed concerns with CKD after prolia - will schedule 10d lab visit for Cr and Ca.       Iron deficiency anemia    S/p iron infusion x2 03/2020. Levels improving, transferrin low. Reassess at CPE in 3 months.       Hypercalcemia    Schedule 10d lab visit to monitor for hypocalcemia after prolia injection in pt with CKD.       Exertional dyspnea    Ongoing - to schedule cards f/u.       CKD (chronic kidney disease) stage 3, GFR 30-59 ml/min    Recent deterioration in kidney function from GFR 42 to 29. She is on daily full dose PPI as well as BID ACEI for blood pressure control and low dose lasix. Doesn't do well with water intake. Discussed increasing water intake to help kidney health. I did ask her to schedule f/u with GI to see if we can lower PPI dose. Reassess levels at CPE.       Relevant Orders   Renal function panel   Angiodysplasia of colon with hemorrhage       Meds ordered this encounter  Medications   loratadine (CLARITIN) 10 MG tablet    Sig: Take 1 tablet (10 mg total) by mouth every evening.    Dispense:  90 tablet    Refill:  3   denosumab (PROLIA) injection 60 mg   Orders Placed This Encounter  Procedures   Renal function panel    Standing Status:   Future    Standing Expiration Date:   07/27/2021    Patient Instructions  Continue current medicines.  Prolia shot today.  Schedule lab visit in 10 days to recheck kidneys and calcium levels.  Schedule 3 month physical and medicare wellness visit    Follow up plan: Return in  about 3 months (around 10/27/2020), or if symptoms worsen or fail to improve, for annual exam, prior fasting for blood work, medicare wellness visit.  [name], MD

## 2020-07-27 NOTE — Assessment & Plan Note (Addendum)
S/p iron infusion x2 03/2020. Levels improving, transferrin low. Reassess at CPE in 3 months.

## 2020-07-27 NOTE — Assessment & Plan Note (Signed)
Schedule 10d lab visit to monitor for hypocalcemia after prolia injection in pt with CKD.

## 2020-07-27 NOTE — Assessment & Plan Note (Signed)
Significant improvement after blood transfusion then iron infusions x2. Continues omeprazole 40mg  daily, pepcid 20mg  daily. Still notes some ongoing dyspnea - rec schedule f/u with cards.

## 2020-07-27 NOTE — Telephone Encounter (Signed)
Pt in office today, wanted to proceed with getting Prolia per Dr Darnell Level.  CMA to give injection.   Will send to Dr Darnell Level to advise on Calcium labs - see note below, Dr Darnell Level wanted labs 10 days after Prolia injection.  Please advise, thanks.

## 2020-07-27 NOTE — Assessment & Plan Note (Signed)
Recent deterioration in kidney function from GFR 42 to 29. She is on daily full dose PPI as well as BID ACEI for blood pressure control and low dose lasix. Doesn't do well with water intake. Discussed increasing water intake to help kidney health. I did ask her to schedule f/u with GI to see if we can lower PPI dose. Reassess levels at CPE.

## 2020-07-27 NOTE — Telephone Encounter (Signed)
I want lab visit 10 days after prolia shot. Labs already ordered. Thanks.

## 2020-08-07 ENCOUNTER — Other Ambulatory Visit: Payer: Self-pay

## 2020-08-07 ENCOUNTER — Other Ambulatory Visit (INDEPENDENT_AMBULATORY_CARE_PROVIDER_SITE_OTHER): Payer: Medicare HMO

## 2020-08-07 ENCOUNTER — Encounter: Payer: Self-pay | Admitting: Family Medicine

## 2020-08-07 DIAGNOSIS — N1832 Chronic kidney disease, stage 3b: Secondary | ICD-10-CM

## 2020-08-07 LAB — RENAL FUNCTION PANEL
Albumin: 3.8 g/dL (ref 3.5–5.2)
BUN: 13 mg/dL (ref 6–23)
CO2: 22 mEq/L (ref 19–32)
Calcium: 8.3 mg/dL — ABNORMAL LOW (ref 8.4–10.5)
Chloride: 111 mEq/L (ref 96–112)
Creatinine, Ser: 1.37 mg/dL — ABNORMAL HIGH (ref 0.40–1.20)
GFR: 36.37 mL/min — ABNORMAL LOW (ref >60.00)
Glucose, Bld: 89 mg/dL (ref 70–99)
Phosphorus: 1.7 mg/dL — ABNORMAL LOW (ref 2.3–4.6)
Potassium: 4.1 mEq/L (ref 3.5–5.1)
Sodium: 141 mEq/L (ref 135–145)

## 2020-08-07 MED ORDER — HYDROXYZINE HCL 10 MG PO TABS: 5.0000 mg | ORAL_TABLET | Freq: Two times a day (BID) | ORAL | 0 refills | Status: DC | PRN

## 2020-08-07 NOTE — Telephone Encounter (Signed)
I'm sorry to hear about her sibling. plz notify - may try hydroxyzine PRN anxiety/stress with sedation precautions. Sent locally.

## 2020-08-07 NOTE — Telephone Encounter (Signed)
[name] (daughter) checking to see if they could get something for pt for anxiety  [facility]

## 2020-08-13 ENCOUNTER — Encounter: Payer: Self-pay | Admitting: Family Medicine

## 2020-09-07 ENCOUNTER — Other Ambulatory Visit: Payer: Self-pay | Admitting: Cardiovascular Disease

## 2020-09-19 ENCOUNTER — Telehealth: Payer: Self-pay

## 2020-09-19 NOTE — Telephone Encounter (Signed)
Pt's daughter, [name], called requesting script for raised toilet seat for pt. Inquired where she wanted it sent and did not know. Advised DME store in [place]. Contacted [facility] and they said they only sell retail and the pt would purchase and then get reimbursement form insurance. Valla Leaver and she said she would look on [place] first and let the office know if she still needed a script or any help.

## 2020-09-21 ENCOUNTER — Encounter: Payer: Self-pay | Admitting: Gastroenterology

## 2020-09-21 ENCOUNTER — Ambulatory Visit: Payer: Medicare HMO | Admitting: Gastroenterology

## 2020-09-21 VITALS — BP 120/70 | HR 86 | Ht 64.0 in | Wt 228.0 lb

## 2020-09-21 DIAGNOSIS — D509 Iron deficiency anemia, unspecified: Secondary | ICD-10-CM

## 2020-09-21 DIAGNOSIS — K5900 Constipation, unspecified: Secondary | ICD-10-CM | POA: Insufficient documentation

## 2020-09-21 NOTE — Patient Instructions (Addendum)
If you are age 84 or older, your body mass index should be between 23-30. Your Body mass index is 39.14 kg/m. If this is out of the aforementioned range listed, please consider follow up with your Primary Care Provider.  If you are age 40 or younger, your body mass index should be between 19-25. Your Body mass index is 39.14 kg/m. If this is out of the aformentioned range listed, please consider follow up with your Primary Care Provider.    Increase stool softeners or switch to Miralax as needed, for constipation.   Please follow up as needed.   Thank you for entrusting me with your care and for choosing [employer], Alonza Bogus, P.A. - C.

## 2020-09-21 NOTE — Progress Notes (Signed)
09/21/2020 ARIANNI GALLEGO [id number] [birth year]   HISTORY OF PRESENT ILLNESS: This is an 86 year old female who is here for hospital follow-up from back in March when she was hospitalized for iron deficiency anemia with a drop in her hemoglobin and heme positive stools.  Hemoglobin dipped down to about 6.7 g.  She received a couple of units of packed red blood cells and some IV iron while in hospital.  She also underwent EGD and colonoscopy.  EGD revealed erosive gastropathy with stigmata of bleeding and gastric biopsies showed reactive gastropathy, negative for Hpylori.  Colonoscopy showed the following:  - A single bleeding colonic angioectasia. Treated with argon plasma coagulation (APC). - Two 5 to 7 mm polyps in the descending colon and in the ascending colon, removed with a cold snare. Resected and retrieved.  Path showed tubular adenoma - External hemorrhoids.  Her most recent hemoglobin in July had been continuing to increase and was up to 10.9 g.  Her iron studies have remained normal as well.  She denies any sign of gastrointestinal bleeding.  She only reports constipation.  She currently uses one stool softener each morning and one each evening.   Past Medical History:  Diagnosis Date  . Acute upper GI bleeding 03/12/2020  . Arthritis   . Depression   . History of anemia   . History of chicken pox   . History of ulcer disease 1990s  . HLD (hyperlipidemia)   . HTN (hypertension)   . Hypothyroidism   . Osteoporosis 03/2016   at [place] - T -3.1 hip, -1.0 spine  . Uses hearing aid    Past Surgical History:  Procedure Laterality Date  . APPENDECTOMY  1974  . BACK SURGERY  1979  . BIOPSY  03/13/2020   Procedure: BIOPSY;  Surgeon: Ladene Artist, MD;  Location: [place];  Service: Endoscopy;;  . CARDIOVASCULAR STRESS TEST  2010   WNL  . CARPAL TUNNEL RELEASE Left 04/28/2017  . CATARACT EXTRACTION Bilateral 2012  . COLONOSCOPY WITH PROPOFOL N/A 03/14/2020   single  bleeding colonic angiectasia treated with APC (Pyrtle, Lajuan Lines, MD)  . DEXA  03/2016   at [facility] - T -3.1 hip, -1.0 spine  . ESOPHAGOGASTRODUODENOSCOPY (EGD) WITH PROPOFOL N/A 03/13/2020   erosive gastropathy with stigmata of recent bleeding Fuller Plan, Pricilla Riffle, MD)  . GALLBLADDER SURGERY  1974  . HOT HEMOSTASIS N/A 03/14/2020   Procedure: HOT HEMOSTASIS (ARGON PLASMA COAGULATION/BICAP);  Surgeon: Jerene Bears, MD;  Location: [facility] ENDOSCOPY;  Service: Gastroenterology;  Laterality: N/A;  . [place]   right  . [place]   left  . KNEE SURGERY  2008  . POLYPECTOMY  03/14/2020   Procedure: POLYPECTOMY;  Surgeon: Jerene Bears, MD;  Location: [facility] ENDOSCOPY;  Service: Gastroenterology;;  . TONSILLECTOMY  1945  . US ECHOCARDIOGRAPHY  2010   LVEF 65-75%, overall normal, mildly decreased LV diastolic compliance    reports that she quit smoking about 27 years ago. She has never used smokeless tobacco. She reports that she does not drink alcohol and does not use drugs. family history includes COPD in her sister; Cancer in her father; Cancer (age of onset: 57) in her sister; Cancer (age of onset: 44) in her sister; Cancer (age of onset: 74) in her mother; Cancer (age of onset: 96) in her brother; Cancer (age of onset: 52) in her sister; Diabetes in her daughter; Hypertension in her daughter. No Known Allergies  Outpatient Encounter Medications as of 09/21/2020  Medication Sig  . acetaminophen (TYLENOL) 500 MG tablet Take 1,000 mg by mouth in the morning and at bedtime.   . cholecalciferol (VITAMIN D) 1000 UNITS tablet Take 1,000 Units by mouth daily.  Marland Kitchen denosumab (PROLIA) 60 MG/ML SOSY injection Inject 60 mg into the skin every 6 (six) months.  . diphenhydramine-acetaminophen (TYLENOL PM) 25-500 MG TABS Take 2 tablets by mouth at bedtime as needed (sleep).   Mariane Baumgarten Sodium (STOOL SOFTENER LAXATIVE PO) Take 1 Dose by mouth in the morning and at bedtime.  . famotidine (PEPCID) 20 MG  tablet Take 20 mg by mouth daily.  Marland Kitchen FLUoxetine (PROZAC) 40 MG capsule Take 1 capsule (40 mg total) by mouth daily.  . furosemide (LASIX) 20 MG tablet TAKE 1/2 TABLET(10 MG) BY MOUTH DAILY  . hydrOXYzine (ATARAX/VISTARIL) 10 MG tablet Take 0.5-1 tablets (5-10 mg total) by mouth 2 (two) times daily as needed for anxiety.  Marland Kitchen levothyroxine (SYNTHROID) 88 MCG tablet TAKE 1 TABLET BY MOUTH EVERY DAY BEFORE BREAKFAST  . lisinopril (ZESTRIL) 40 MG tablet Take 1 tablet (40 mg total) by mouth 2 (two) times daily.  Marland Kitchen loratadine (CLARITIN) 10 MG tablet Take 1 tablet (10 mg total) by mouth every evening.  . mirabegron ER (MYRBETRIQ) 25 MG TB24 tablet TAKE 1 TABLET(25 MG) BY MOUTH DAILY  . omeprazole (PRILOSEC) 40 MG capsule TAKE 1 CAPSULE(40 MG) BY MOUTH DAILY  . pravastatin (PRAVACHOL) 40 MG tablet TAKE 1 TABLET BY MOUTH EVERY DAY  . verapamil (CALAN-SR) 240 MG CR tablet TAKE 1 TABLET(240 MG) BY MOUTH AT BEDTIME  . vitamin B-12 (CYANOCOBALAMIN) 500 MCG tablet Take 1 tablet (500 mcg total) by mouth once a week.   No facility-administered encounter medications on file as of 09/21/2020.     REVIEW OF SYSTEMS  : All other systems reviewed and negative except where noted in the History of Present Illness.   PHYSICAL EXAM: BP 120/70   Pulse 86   Ht 5\' 4"  (1.626 m)   Wt 228 lb (103.4 kg)   BMI 39.14 kg/m  General: Well developed white female in no acute distress Head: Normocephalic and atraumatic Eyes:  Sclerae anicteric, conjunctiva pink. Ears: Normal auditory acuity Lungs: Clear throughout to auscultation; no W/R/R. Heart: Regular rate and rhythm; no M/R/G. Abdomen: Soft, non-distended.  BS present.  Non-tender. Musculoskeletal: Symmetrical with no gross deformities  Skin: No lesions on visible extremities Extremities: No edema  Neurological: Alert oriented x 4, grossly non-focal Psychological:  Alert and cooperative. Normal mood and affect  ASSESSMENT AND PLAN: *Iron deficiency anemia: Had  EGD and colonoscopy in March and was found to have a single bleeding colonic angiectasia that had APC applied as well as erosive gastropathy with stigmata of bleeding.  She received a few doses of IV iron back then as well.  Hgb and iron studies have continued to improve/remain stable.  No sign of bleeding.  Will continue to observe for now.  PCP can follow Hgb and refer back here for any recurrent or new issues.   *Constipation:   Currently only taking 1 stool softener twice daily.  Can certainly increase this dosing or switch to MiraLAX if needed.   CC:  [name], MD

## 2020-09-29 NOTE — Progress Notes (Signed)
Reviewed and agree with documentation and assessment and plan. K. Veena Pattijo Juste , MD   

## 2020-10-01 ENCOUNTER — Other Ambulatory Visit: Payer: Self-pay | Admitting: Family Medicine

## 2020-10-01 DIAGNOSIS — E039 Hypothyroidism, unspecified: Secondary | ICD-10-CM

## 2020-10-01 DIAGNOSIS — N1832 Chronic kidney disease, stage 3b: Secondary | ICD-10-CM

## 2020-10-01 DIAGNOSIS — E782 Mixed hyperlipidemia: Secondary | ICD-10-CM

## 2020-10-01 DIAGNOSIS — D500 Iron deficiency anemia secondary to blood loss (chronic): Secondary | ICD-10-CM

## 2020-10-01 NOTE — Addendum Note (Signed)
Addended by: [name] on: 10/01/2020 09:29 PM   Modules accepted: Orders

## 2020-10-04 ENCOUNTER — Other Ambulatory Visit (INDEPENDENT_AMBULATORY_CARE_PROVIDER_SITE_OTHER): Payer: Medicare HMO

## 2020-10-04 ENCOUNTER — Other Ambulatory Visit: Payer: Self-pay

## 2020-10-04 DIAGNOSIS — D500 Iron deficiency anemia secondary to blood loss (chronic): Secondary | ICD-10-CM

## 2020-10-04 DIAGNOSIS — E782 Mixed hyperlipidemia: Secondary | ICD-10-CM

## 2020-10-04 DIAGNOSIS — N1832 Chronic kidney disease, stage 3b: Secondary | ICD-10-CM | POA: Diagnosis not present

## 2020-10-04 DIAGNOSIS — E039 Hypothyroidism, unspecified: Secondary | ICD-10-CM | POA: Diagnosis not present

## 2020-10-04 LAB — LIPID PANEL
Cholesterol: 156 mg/dL (ref 0–200)
HDL: 61.4 mg/dL (ref >39.00)
LDL Cholesterol: 71 mg/dL (ref 0–99)
NonHDL: 95.08
Total CHOL/HDL Ratio: 3
Triglycerides: 120 mg/dL (ref 0.0–149.0)
VLDL: 24 mg/dL (ref 0.0–40.0)

## 2020-10-04 LAB — COMPREHENSIVE METABOLIC PANEL
ALT: 8 U/L (ref 0–35)
AST: 12 U/L (ref 0–37)
Albumin: 4 g/dL (ref 3.5–5.2)
Alkaline Phosphatase: 57 U/L (ref 39–117)
BUN: 18 mg/dL (ref 6–23)
CO2: 29 mEq/L (ref 19–32)
Calcium: 9.4 mg/dL (ref 8.4–10.5)
Chloride: 107 mEq/L (ref 96–112)
Creatinine, Ser: 1.46 mg/dL — ABNORMAL HIGH (ref 0.40–1.20)
GFR: 31.73 mL/min — ABNORMAL LOW (ref >60.00)
Glucose, Bld: 88 mg/dL (ref 70–99)
Potassium: 4.5 mEq/L (ref 3.5–5.1)
Sodium: 141 mEq/L (ref 135–145)
Total Bilirubin: 0.4 mg/dL (ref 0.2–1.2)
Total Protein: 6.9 g/dL (ref 6.0–8.3)

## 2020-10-04 LAB — CBC WITH DIFFERENTIAL/PLATELET
Basophils Absolute: 0 10*3/uL (ref 0.0–0.1)
Basophils Relative: 0.7 % (ref 0.0–3.0)
Eosinophils Absolute: 0.2 10*3/uL (ref 0.0–0.7)
Eosinophils Relative: 4.1 % (ref 0.0–5.0)
HCT: 36 % (ref 36.0–46.0)
Hemoglobin: 11.8 g/dL — ABNORMAL LOW (ref 12.0–15.0)
Lymphocytes Relative: 22.4 % (ref 12.0–46.0)
Lymphs Abs: 1 10*3/uL (ref 0.7–4.0)
MCHC: 32.8 g/dL (ref 30.0–36.0)
MCV: 89.6 fl (ref 78.0–100.0)
Monocytes Absolute: 0.5 10*3/uL (ref 0.1–1.0)
Monocytes Relative: 11.6 % (ref 3.0–12.0)
Neutro Abs: 2.6 10*3/uL (ref 1.4–7.7)
Neutrophils Relative %: 61.2 % (ref 43.0–77.0)
Platelets: 220 10*3/uL (ref 150.0–400.0)
RBC: 4.02 Mil/uL (ref 3.87–5.11)
RDW: 14 % (ref 11.5–15.5)
WBC: 4.2 10*3/uL (ref 4.0–10.5)

## 2020-10-04 LAB — VITAMIN D 25 HYDROXY (VIT D DEFICIENCY, FRACTURES): VITD: 45.49 ng/mL (ref 30.00–100.00)

## 2020-10-04 LAB — MAGNESIUM: Magnesium: 2 mg/dL (ref 1.5–2.5)

## 2020-10-04 LAB — TSH: TSH: 2.77 u[IU]/mL (ref 0.35–4.50)

## 2020-10-04 LAB — PHOSPHORUS: Phosphorus: 3.1 mg/dL (ref 2.3–4.6)

## 2020-10-05 LAB — PARATHYROID HORMONE, INTACT (NO CA): PTH: 173 pg/mL — ABNORMAL HIGH (ref 14–64)

## 2020-10-11 ENCOUNTER — Ambulatory Visit (INDEPENDENT_AMBULATORY_CARE_PROVIDER_SITE_OTHER): Payer: Medicare HMO | Admitting: Family Medicine

## 2020-10-11 ENCOUNTER — Encounter: Payer: Self-pay | Admitting: Family Medicine

## 2020-10-11 ENCOUNTER — Other Ambulatory Visit: Payer: Self-pay

## 2020-10-11 VITALS — BP 134/72 | HR 84 | Temp 97.9°F | Ht 62.5 in | Wt 230.1 lb

## 2020-10-11 DIAGNOSIS — Z0001 Encounter for general adult medical examination with abnormal findings: Secondary | ICD-10-CM | POA: Diagnosis not present

## 2020-10-11 DIAGNOSIS — D229 Melanocytic nevi, unspecified: Secondary | ICD-10-CM

## 2020-10-11 DIAGNOSIS — E782 Mixed hyperlipidemia: Secondary | ICD-10-CM

## 2020-10-11 DIAGNOSIS — N1832 Chronic kidney disease, stage 3b: Secondary | ICD-10-CM

## 2020-10-11 DIAGNOSIS — M810 Age-related osteoporosis without current pathological fracture: Secondary | ICD-10-CM

## 2020-10-11 DIAGNOSIS — E039 Hypothyroidism, unspecified: Secondary | ICD-10-CM

## 2020-10-11 DIAGNOSIS — Z0000 Encounter for general adult medical examination without abnormal findings: Secondary | ICD-10-CM

## 2020-10-11 DIAGNOSIS — Z7189 Other specified counseling: Secondary | ICD-10-CM

## 2020-10-11 DIAGNOSIS — I10 Essential (primary) hypertension: Secondary | ICD-10-CM

## 2020-10-11 DIAGNOSIS — R0609 Other forms of dyspnea: Secondary | ICD-10-CM

## 2020-10-11 DIAGNOSIS — D649 Anemia, unspecified: Secondary | ICD-10-CM

## 2020-10-11 DIAGNOSIS — I5032 Chronic diastolic (congestive) heart failure: Secondary | ICD-10-CM

## 2020-10-11 DIAGNOSIS — R0600 Dyspnea, unspecified: Secondary | ICD-10-CM

## 2020-10-11 DIAGNOSIS — F331 Major depressive disorder, recurrent, moderate: Secondary | ICD-10-CM

## 2020-10-11 DIAGNOSIS — N2581 Secondary hyperparathyroidism of renal origin: Secondary | ICD-10-CM

## 2020-10-11 DIAGNOSIS — Z87898 Personal history of other specified conditions: Secondary | ICD-10-CM

## 2020-10-11 NOTE — Assessment & Plan Note (Signed)
Preventative protocols reviewed and updated unless pt declined. Discussed healthy diet and lifestyle.  

## 2020-10-11 NOTE — Progress Notes (Signed)
This visit was conducted in person.  BP 134/72 (BP Location: Left Arm, Patient Position: Sitting, Cuff Size: Large)   Pulse 84   Temp 97.9 F (36.6 C) (Temporal)   Ht 5' 2.5" (1.588 m)   Wt 230 lb 2 oz (104.4 kg)   SpO2 97%   BMI 41.42 kg/m    CC: AMW Subjective:    Patient ID: [name], female    DOB: [birth year], 86 y.o.   MRN: [id number]  HPI: [name] is a 86 y.o. female presenting on 10/11/2020 for Medicare Wellness (Pt accompanied daughter, [name]- temp 98.2.)   Did not see health advisor this year.    Hearing Screening   125Hz  250Hz  500Hz  1000Hz  2000Hz  3000Hz  4000Hz  6000Hz  8000Hz   Right ear:           Left ear:           Comments: Wears bilateral hearing aids.  Wearing at today's OV.  Next hearing exam tomorrow.    Visual Acuity Screening   Right eye Left eye Both eyes  Without correction: 20/100 20/70 20/50  With correction:         Office Visit from 10/11/2020 in [place] at [facility] Total Score 5    Failed vision screen - told she will need eye surgery, has had cataract surgery.  Hearing exam scheduled for tomorrow given ongoing marked difficulty hearing despite aides.  Fall Risk  10/11/2020 09/01/2019 03/31/2018 03/19/2017 03/07/2016  Falls in the past year? 0 0 Yes No No  Comment - - fell on birthday in 2018 after stepping up on curb; bruising only with no medical treatment - -  Number falls in past yr: - 0 1 - -  Injury with Fall? - 1 Yes - -  Risk for fall due to : - - Impaired balance/gait;Impaired mobility - Impaired balance/gait    Sibling passed away recently, 2 close friends also passed away. We started hydroxyzine PRN sleep.   IDA saw GI s/p EGD and colonoscopy - single bleeding colonic angiectasia s/p APC. Also found erosive gastropathy with stigmata of bleeding. She received IV iron earlier this year as well as 1u blood transfusion. rec PCP continue to monitor this, refer back to GI if needed. Continues pepcid and  omeprazole daily.   Ongoing dyspnea - overdue for cards f/u.   Has new raised toilet seat as well as shower chair.   Preventative: Colonoscopy - had normal colonoscopy per pt years ago. Normal iFOB 2013. Has aged out. Mammogram - 10/2019, Birads1. Agrees to repeat. Does do breast exams at home.  Well woman - aged out. Declines pelvic exam. DEXA 03/2016 at [facility] - T -3.1 hip, -1.0 spine. DEXA 10/2019 - T -3.1 femur, -0.9 spine. Received prolia 01/2018 then 05/2019 then again 07/27/2020 Flu - yearly Pneumovax - 09/2012. Prevnar - 02/2014 COVID vaccine - Moderna 01/2020, 02/2020 Tetanus - 2010  zostavax- 12/2011 shingrix - discussed  Advanced directives: has not set up. Would want daughter [name] to be HCPOA. Packet previously provided - advised to continue working on this. Doesn't want prolonged life support.daughter will help with forms.  Seat belt use discussed  Sunscreen use discussed. No changing moles on skin. Non smoker Alcohol - none  Dentist - does not see  Eye exam - yearly Bowels - no constipation Bladder - some urge incontinence especially with caffeine, she wears pads   Widower since 2000 - husband battled cancer for 6 yrs, died from  MI. Daughter [name]) passed away 2015-04-14. Other daughter [name] nearby.  Lives in retirement home - [facility] on [address].  Occupation: retired  HS: 12th grade  Activity: no regular exercise  Diet: good water, fruits/vegetables daily      Relevant past medical, surgical, family and social history reviewed and updated as indicated. Interim medical history since our last visit reviewed. Allergies and medications reviewed and updated. Outpatient Medications Prior to Visit  Medication Sig Dispense Refill  . acetaminophen (TYLENOL) 500 MG tablet Take 1,000 mg by mouth in the morning and at bedtime.     . cholecalciferol (VITAMIN D) 1000 UNITS tablet Take 1,000 Units by mouth daily.    Marland Kitchen denosumab (PROLIA) 60 MG/ML SOSY injection  Inject 60 mg into the skin every 6 (six) months.    . diphenhydramine-acetaminophen (TYLENOL PM) 25-500 MG TABS Take 2 tablets by mouth at bedtime as needed (sleep).     Mariane Baumgarten Sodium (STOOL SOFTENER LAXATIVE PO) Take 1 Dose by mouth in the morning and at bedtime.    . famotidine (PEPCID) 20 MG tablet Take 20 mg by mouth daily.    Marland Kitchen FLUoxetine (PROZAC) 40 MG capsule Take 1 capsule (40 mg total) by mouth daily. 90 capsule 0  . furosemide (LASIX) 20 MG tablet TAKE 1/2 TABLET(10 MG) BY MOUTH DAILY 45 tablet 1  . hydrOXYzine (ATARAX/VISTARIL) 10 MG tablet Take 0.5-1 tablets (5-10 mg total) by mouth 2 (two) times daily as needed for anxiety. 30 tablet 0  . levothyroxine (SYNTHROID) 88 MCG tablet TAKE 1 TABLET BY MOUTH EVERY DAY BEFORE BREAKFAST 90 tablet 0  . lisinopril (ZESTRIL) 40 MG tablet Take 1 tablet (40 mg total) by mouth 2 (two) times daily. 180 tablet 0  . loratadine (CLARITIN) 10 MG tablet Take 1 tablet (10 mg total) by mouth every evening. 90 tablet 3  . mirabegron ER (MYRBETRIQ) 25 MG TB24 tablet TAKE 1 TABLET(25 MG) BY MOUTH DAILY 90 tablet 0  . omeprazole (PRILOSEC) 40 MG capsule TAKE 1 CAPSULE(40 MG) BY MOUTH DAILY 90 capsule 0  . pravastatin (PRAVACHOL) 40 MG tablet TAKE 1 TABLET BY MOUTH EVERY DAY 90 tablet 0  . verapamil (CALAN-SR) 240 MG CR tablet TAKE 1 TABLET(240 MG) BY MOUTH AT BEDTIME 90 tablet 1  . vitamin B-12 (CYANOCOBALAMIN) 500 MCG tablet Take 1 tablet (500 mcg total) by mouth once a week.     No facility-administered medications prior to visit.     Per HPI unless specifically indicated in ROS section below Review of Systems  Constitutional: Negative for activity change, appetite change, chills, fatigue, fever and unexpected weight change.  HENT: Negative for hearing loss.   Eyes: Negative for visual disturbance.  Respiratory: Positive for shortness of breath (exertional going up 1 flight). Negative for cough, chest tightness and wheezing.   Cardiovascular:  Positive for leg swelling. Negative for chest pain and palpitations.  Gastrointestinal: Negative for abdominal distention, abdominal pain, blood in stool, constipation, diarrhea, nausea and vomiting.  Endocrine: Positive for cold intolerance.  Genitourinary: Negative for difficulty urinating and hematuria.  Musculoskeletal: Negative for arthralgias, myalgias and neck pain.  Skin: Negative for rash.  Neurological: Negative for dizziness, seizures, syncope and headaches.  Hematological: Negative for adenopathy. Does not bruise/bleed easily.  Psychiatric/Behavioral: Positive for dysphoric mood. The patient is not nervous/anxious.    Objective:  BP 134/72 (BP Location: Left Arm, Patient Position: Sitting, Cuff Size: Large)   Pulse 84   Temp 97.9 F (36.6 C) (Temporal)  Ht 5' 2.5" (1.588 m)   Wt 230 lb 2 oz (104.4 kg)   SpO2 97%   BMI 41.42 kg/m   Wt Readings from Last 3 Encounters:  10/11/20 230 lb 2 oz (104.4 kg)  09/21/20 228 lb (103.4 kg)  07/27/20 (!) 230 lb 3 oz (104.4 kg)      Physical Exam Vitals and nursing note reviewed.  Constitutional:      General: She is not in acute distress.    Appearance: She is well-developed.  HENT:     Head: Normocephalic and atraumatic.     Right Ear: Hearing, tympanic membrane, ear canal and external ear normal.     Left Ear: Hearing, tympanic membrane, ear canal and external ear normal.     Ears:     Comments: Hard of hearing despite hearing aides    Nose: Nose normal.     Mouth/Throat:     Pharynx: Uvula midline. No oropharyngeal exudate or posterior oropharyngeal erythema.  Eyes:     General: No scleral icterus.    Conjunctiva/sclera: Conjunctivae normal.     Pupils: Pupils are equal, round, and reactive to light.  Cardiovascular:     Rate and Rhythm: Normal rate and regular rhythm.     Pulses:          Radial pulses are 2+ on the right side and 2+ on the left side.     Heart sounds: Normal heart sounds. No murmur heard.    Pulmonary:     Effort: Pulmonary effort is normal. No respiratory distress.     Breath sounds: Normal breath sounds. No wheezing or rales.  Abdominal:     General: Bowel sounds are normal. There is no distension.     Palpations: Abdomen is soft. There is no mass.     Tenderness: There is no abdominal tenderness. There is no guarding or rebound.  Musculoskeletal:        General: Normal range of motion.     Cervical back: Normal range of motion and neck supple.     Right lower leg: No edema.     Left lower leg: No edema.  Lymphadenopathy:     Cervical: No cervical adenopathy.  Skin:    General: Skin is warm and dry.     Findings: No rash.     Comments: Dark nevus present to R lateral back  Neurological:     General: No focal deficit present.     Mental Status: She is alert and oriented to person, place, and time.     Comments:  CN grossly intact, station and gait intact Recall 3/3  calculation 5/5 DLROW  Psychiatric:        Mood and Affect: Mood normal.        Behavior: Behavior normal.        Thought Content: Thought content normal.        Judgment: Judgment normal.       Results for orders placed or performed in visit on 10/04/20  Parathyroid hormone, intact (no Ca)  Result Value Ref Range   PTH 173 (H) 14 - 64 pg/mL  TSH  Result Value Ref Range   TSH 2.77 0.35 - 4.50 uIU/mL  VITAMIN D 25 Hydroxy (Vit-D Deficiency, Fractures)  Result Value Ref Range   VITD 45.49 30.00 - 100.00 ng/mL  Magnesium  Result Value Ref Range   Magnesium 2.0 1.5 - 2.5 mg/dL  Phosphorus  Result Value Ref Range   Phosphorus 3.1 2.3 -  4.6 mg/dL  Comprehensive metabolic panel  Result Value Ref Range   Sodium 141 135 - 145 mEq/L   Potassium 4.5 3.5 - 5.1 mEq/L   Chloride 107 96 - 112 mEq/L   CO2 29 19 - 32 mEq/L   Glucose, Bld 88 70 - 99 mg/dL   BUN 18 6 - 23 mg/dL   Creatinine, Ser 1.46 (H) 0.40 - 1.20 mg/dL   Total Bilirubin 0.4 0.2 - 1.2 mg/dL   Alkaline Phosphatase 57 39 - 117 U/L    AST 12 0 - 37 U/L   ALT 8 0 - 35 U/L   Total Protein 6.9 6.0 - 8.3 g/dL   Albumin 4.0 3.5 - 5.2 g/dL   GFR 31.73 (L) >60.00 mL/min   Calcium 9.4 8.4 - 10.5 mg/dL  Lipid panel  Result Value Ref Range   Cholesterol 156 0 - 200 mg/dL   Triglycerides 120.0 0 - 149 mg/dL   HDL 61.40 >39.00 mg/dL   VLDL 24.0 0.0 - 40.0 mg/dL   LDL Cholesterol 71 0 - 99 mg/dL   Total CHOL/HDL Ratio 3    NonHDL 95.08   CBC with Differential/Platelet  Result Value Ref Range   WBC 4.2 4.0 - 10.5 K/uL   RBC 4.02 3.87 - 5.11 Mil/uL   Hemoglobin 11.8 (L) 12.0 - 15.0 g/dL   HCT 36.0 36 - 46 %   MCV 89.6 78.0 - 100.0 fl   MCHC 32.8 30.0 - 36.0 g/dL   RDW 14.0 11.5 - 15.5 %   Platelets 220.0 150 - 400 K/uL   Neutrophils Relative % 61.2 43 - 77 %   Lymphocytes Relative 22.4 12 - 46 %   Monocytes Relative 11.6 3 - 12 %   Eosinophils Relative 4.1 0 - 5 %   Basophils Relative 0.7 0 - 3 %   Neutro Abs 2.6 1.4 - 7.7 K/uL   Lymphs Abs 1.0 0.7 - 4.0 K/uL   Monocytes Absolute 0.5 0.1 - 1.0 K/uL   Eosinophils Absolute 0.2 0.0 - 0.7 K/uL   Basophils Absolute 0.0 0.0 - 0.1 K/uL   Depression screen [facility] 2/9 10/11/2020 03/03/2020 11/09/2019 09/01/2019 03/31/2018  Decreased Interest 3 1 2  0 0  Down, Depressed, Hopeless 2 2 3  0 0  PHQ - 2 Score 5 3 5  0 0  Altered sleeping 2 3 0 - 0  Tired, decreased energy 1 2 3  - 0  Change in appetite 3 0 3 - 0  Feeling bad or failure about yourself  1 0 3 - 0  Trouble concentrating 3 1 1  - 0  Moving slowly or fidgety/restless 2 1 0 - 0  Suicidal thoughts 0 0 0 - 0  PHQ-9 Score 17 10 15  - 0  Difficult doing work/chores - - - - Not difficult at all    GAD 7 : Generalized Anxiety Score 10/11/2020 03/03/2020 11/09/2019  Nervous, Anxious, on Edge 2 2 3   Control/stop worrying 3 3 3   Worry too much - different things 3 2 3   Trouble relaxing 1 1 1   Restless 3 1 2   Easily annoyed or irritable 3 1 0  Afraid - awful might happen 0 1 0  Total GAD 7 Score 15 11 12    Assessment & Plan:   This visit occurred during the SARS-CoV-2 public health emergency.  Safety protocols were in place, including screening questions prior to the visit, additional usage of staff PPE, and extensive cleaning of exam room while observing appropriate contact  time as indicated for disinfecting solutions.   Problem List Items Addressed This Visit    Symptomatic anemia    S/p blood transfusion (1u) then iron infusions x2.  Anemia continues to improve, now off iron replacement.       Secondary hyperparathyroidism of renal origin ([place])    Presumed renal. Calcium levels now normal. Phosphorus levels normal. Continue to monitor.  Consider phosphate restriction and calcitriol if persistently elevated PTH levels.       Osteoporosis    Continue prolia. Last injection 07/27/2020.  Currently off calcium supplementation due to hypercalcemia.       Medicare annual wellness visit, subsequent - Primary    I have personally reviewed the Medicare Annual Wellness questionnaire and have noted 1. The patient's medical and social history 2. Their use of alcohol, tobacco or illicit drugs 3. Their current medications and supplements 4. The patient's functional ability including ADL's, fall risks, home safety risks and hearing or visual impairment. Cognitive function has been assessed and addressed as indicated.  5. Diet and physical activity 6. Evidence for depression or mood disorders The patients weight, height, BMI have been recorded in the chart. I have made referrals, counseling and provided education to the patient based on review of the above and I have provided the pt with a written personalized care plan for preventive services. Provider list updated.. See scanned questionairre as needed for further documentation. Reviewed preventative protocols and updated unless pt declined.       MDD (major depressive disorder), recurrent episode, moderate (HCC)    Situational deterioration in mood with recent  family/friend deaths (grieving). She continues prozac 40mg  daily. Will not change dose at this time.      Hypothyroidism    Chronic, stable on current levothyroxine 54mcg regimen.       Hypophosphatemia    This has resolved.      Hypertension    Chronic, stable on lisinopril 40mg  bid and verapamil 240mg  dailiy.       Hyperlipidemia    Chronic, stable on pravastatin. The ASCVD Risk score Mikey Bussing DC Jr., et al., 2013) failed to calculate for the following reasons:   The 2013 ASCVD risk score is only valid for ages 8 to 31       Hypercalcemia    Resolved off calcium suupplement.  Consider restarting for osteoporosis.       History of ulcer disease    Remote h/o this - continues omeprazole 40mg  daily and pepcid 20mg  nightly.       Exertional dyspnea    Ongoing despite continued improvement in anemia - encouraged cards f/u for further evaluation.       Encounter for general adult medical examination with abnormal findings    Preventative protocols reviewed and updated unless pt declined. Discussed healthy diet and lifestyle.       CKD (chronic kidney disease) stage 3, GFR 30-59 ml/min (HCC)    GFR remaining impaired.  Continues to struggle with water intake.  Continue to monitor. PPI dose has been decreased to QD Continues high ACEI dose.       Chronic diastolic CHF (congestive heart failure) (HCC) (Chronic)    H/o this. Seems euvolemic on lasix 10mg  daily.       Atypical nevi    Previously referred 2019, I don't have records of evaluation.  Darker mole to R lateral back - will return to derm for skin check      Relevant Orders   Ambulatory referral to Dermatology  Advanced care planning/counseling discussion    Advanced directives: has not set up. Would want daughter [name] to be HCPOA. Packet previously provided - advised to continue working on this. Doesn't want prolonged life support.daughter will help with forms.           No orders of the defined  types were placed in this encounter.  Orders Placed This Encounter  Procedures  . Ambulatory referral to Dermatology    Referral Priority:   Routine    Referral Type:   Consultation    Referral Reason:   Specialty Services Required    Requested Specialty:   Dermatology    Number of Visits Requested:   1    Patient instructions: Schedule eye exam.  Schedule follow up with cardiologist to discuss ongoing shortness of breath.  Continue working on living will, bring [place] a copy when complete.  I'm glad you're getting hearing checked this week.  You are doing well. Continue current medicine. Return in 6 months for follow up visit.   Follow up plan: Return in about 6 months (around 04/11/2021) for follow up visit.  [name], MD

## 2020-10-11 NOTE — Assessment & Plan Note (Signed)

## 2020-10-11 NOTE — Patient Instructions (Addendum)
Schedule eye exam.  Schedule follow up with cardiologist to discuss ongoing shortness of breath.  Continue working on living will, bring [place] a copy when complete.  I'm glad you're getting hearing checked this week.  You are doing well. Continue current medicine. Return in 6 months for follow up visit.   Health Maintenance After Age 84 After age 36, you are at a higher risk for certain long-term diseases and infections as well as injuries from falls. Falls are a major cause of broken bones and head injuries in people who are older than age 31. Getting regular preventive care can help to keep you healthy and well. Preventive care includes getting regular testing and making lifestyle changes as recommended by your health care provider. Talk with your health care provider about:  Which screenings and tests you should have. A screening is a test that checks for a disease when you have no symptoms.  A diet and exercise plan that is right for you. What should I know about screenings and tests to prevent falls? Screening and testing are the best ways to find a health problem early. Early diagnosis and treatment give you the best chance of managing medical conditions that are common after age 90. Certain conditions and lifestyle choices may make you more likely to have a fall. Your health care provider may recommend:  Regular vision checks. Poor vision and conditions such as cataracts can make you more likely to have a fall. If you wear glasses, make sure to get your prescription updated if your vision changes.  Medicine review. Work with your health care provider to regularly review all of the medicines you are taking, including over-the-counter medicines. Ask your health care provider about any side effects that may make you more likely to have a fall. Tell your health care provider if any medicines that you take make you feel dizzy or sleepy.  Osteoporosis screening. Osteoporosis is a condition that  causes the bones to get weaker. This can make the bones weak and cause them to break more easily.  Blood pressure screening. Blood pressure changes and medicines to control blood pressure can make you feel dizzy.  Strength and balance checks. Your health care provider may recommend certain tests to check your strength and balance while standing, walking, or changing positions.  Foot health exam. Foot pain and numbness, as well as not wearing proper footwear, can make you more likely to have a fall.  Depression screening. You may be more likely to have a fall if you have a fear of falling, feel emotionally low, or feel unable to do activities that you used to do.  Alcohol use screening. Using too much alcohol can affect your balance and may make you more likely to have a fall. What actions can I take to lower my risk of falls? General instructions  Talk with your health care provider about your risks for falling. Tell your health care provider if: ? You fall. Be sure to tell your health care provider about all falls, even ones that seem minor. ? You feel dizzy, sleepy, or off-balance.  Take over-the-counter and prescription medicines only as told by your health care provider. These include any supplements.  Eat a healthy diet and maintain a healthy weight. A healthy diet includes low-fat dairy products, low-fat (lean) meats, and fiber from whole grains, beans, and lots of fruits and vegetables. Home safety  Remove any tripping hazards, such as rugs, cords, and clutter.  Install safety equipment such  as grab bars in bathrooms and safety rails on stairs.  Keep rooms and walkways well-lit. Activity   Follow a regular exercise program to stay fit. This will help you maintain your balance. Ask your health care provider what types of exercise are appropriate for you.  If you need a cane or walker, use it as recommended by your health care provider.  Wear supportive shoes that have nonskid  soles. Lifestyle  Do not drink alcohol if your health care provider tells you not to drink.  If you drink alcohol, limit how much you have: ? 0-1 drink a day for women. ? 0-2 drinks a day for men.  Be aware of how much alcohol is in your drink. In the [place] one drink equals one typical bottle of beer (12 oz), one-half glass of wine (5 oz), or one shot of hard liquor (1 oz).  Do not use any products that contain nicotine or tobacco, such as cigarettes and e-cigarettes. If you need help quitting, ask your health care provider. Summary  Having a healthy lifestyle and getting preventive care can help to protect your health and wellness after age 16.  Screening and testing are the best way to find a health problem early and help you avoid having a fall. Early diagnosis and treatment give you the best chance for managing medical conditions that are more common for people who are older than age 50.  Falls are a major cause of broken bones and head injuries in people who are older than age 7. Take precautions to prevent a fall at home.  Work with your health care provider to learn what changes you can make to improve your health and wellness and to prevent falls. This information is not intended to replace advice given to you by your health care provider. Make sure you discuss any questions you have with your health care provider. Document Revised: 04/08/2019 Document Reviewed: 10/29/2017 Elsevier Patient Education  2020 [employer].

## 2020-10-12 DIAGNOSIS — N2581 Secondary hyperparathyroidism of renal origin: Secondary | ICD-10-CM | POA: Insufficient documentation

## 2020-10-12 NOTE — Assessment & Plan Note (Signed)
Advanced directives: has not set up. Would want daughter [name] to be HCPOA. Packet previously provided - advised to continue working on this. Doesn't want prolonged life support.daughter will help with forms.

## 2020-10-12 NOTE — Assessment & Plan Note (Signed)
Ongoing despite continued improvement in anemia - encouraged cards f/u for further evaluation.

## 2020-10-12 NOTE — Assessment & Plan Note (Addendum)
Presumed renal. Calcium levels now normal. Phosphorus levels normal. Continue to monitor.  Consider phosphate restriction and calcitriol if persistently elevated PTH levels.

## 2020-10-12 NOTE — Assessment & Plan Note (Signed)
Remote h/o this - continues omeprazole 40mg  daily and pepcid 20mg  nightly.

## 2020-10-12 NOTE — Assessment & Plan Note (Addendum)
Chronic, stable on lisinopril 40mg  bid and verapamil 240mg  dailiy.

## 2020-10-12 NOTE — Assessment & Plan Note (Signed)
Situational deterioration in mood with recent family/friend deaths (grieving). She continues prozac 40mg  daily. Will not change dose at this time.

## 2020-10-12 NOTE — Assessment & Plan Note (Addendum)
GFR remaining impaired.  Continues to struggle with water intake.  Continue to monitor. PPI dose has been decreased to QD Continues high ACEI dose.

## 2020-10-12 NOTE — Assessment & Plan Note (Signed)
Chronic, stable on pravastatin. The ASCVD Risk score Brittany Bussing DC Jr., et al., 2013) failed to calculate for the following reasons:   The 2013 ASCVD risk score is only valid for ages 81 to 52

## 2020-10-12 NOTE — Assessment & Plan Note (Addendum)
H/o this. Seems euvolemic on lasix 10mg  daily.

## 2020-10-12 NOTE — Assessment & Plan Note (Signed)
This has resolved.

## 2020-10-12 NOTE — Assessment & Plan Note (Addendum)
S/p blood transfusion (1u) then iron infusions x2.  Anemia continues to improve, now off iron replacement.

## 2020-10-12 NOTE — Assessment & Plan Note (Addendum)
Continue prolia. Last injection 07/27/2020.  Currently off calcium supplementation due to hypercalcemia.

## 2020-10-12 NOTE — Assessment & Plan Note (Signed)
Resolved off calcium suupplement.  Consider restarting for osteoporosis.

## 2020-10-12 NOTE — Assessment & Plan Note (Addendum)
Chronic, stable on current levothyroxine 38mcg regimen.

## 2020-10-12 NOTE — Assessment & Plan Note (Signed)
Previously referred 2019, I don't have records of evaluation.  Darker mole to R lateral back - will return to derm for skin check

## 2020-10-23 DIAGNOSIS — L820 Inflamed seborrheic keratosis: Secondary | ICD-10-CM | POA: Diagnosis not present

## 2020-10-25 ENCOUNTER — Telehealth: Payer: Medicare HMO | Admitting: General Practice

## 2020-10-30 ENCOUNTER — Other Ambulatory Visit: Payer: Self-pay | Admitting: Cardiovascular Disease

## 2020-10-31 NOTE — Progress Notes (Signed)
Virtual Visit via Telephone Note   This visit type was conducted due to national recommendations for restrictions regarding the COVID-19 Pandemic (e.g. social distancing) in an effort to limit this patient's exposure and mitigate transmission in our community.  Due to her co-morbid illnesses, this patient is at least at moderate risk for complications without adequate follow up.  This format is felt to be most appropriate for this patient at this time.  The patient did not have access to video technology/had technical difficulties with video requiring transitioning to audio format only (telephone).  All issues noted in this document were discussed and addressed.  No physical exam could be performed with this format.  Please refer to the patient's chart for her  consent to telehealth for [facility].  Evaluation Performed:  Follow-up visit  This visit type was conducted due to national recommendations for restrictions regarding the COVID-19 Pandemic (e.g. social distancing).  This format is felt to be most appropriate for this patient at this time.  All issues noted in this document were discussed and addressed.  No physical exam was performed (except for noted visual exam findings with Video Visits).  Please refer to the patient's chart (MyChart message for video visits and phone note for telephone visits) for the patient's consent to telehealth for [place]  Date:  11/01/2020   ID:  [name], DOB [birth year], MRN [id number]  Patient Location:  [address] [address]   Provider location:     [place] [address] Office [phone] Fax [phone]   PCP:  [name], MD  Cardiologist:  Quay Burow, MD  Electrophysiologist:  None   Chief Complaint: Follow-up for chronic diastolic CHF and hypertension  History of Present Illness:    [name] is a 86 y.o. female who  presents via audio/video conferencing for a telehealth visit today.  Patient verified DOB and address.  She has a PMH of chronic diastolic CHF, hypertension, CKD stage III, hyperlipidemia, exertional dyspnea, systolic murmur and iron deficiency anemia.  She is also hard of hearing.  She lives in assisted living and was previously referred to cardiology for an evaluation of her dyspnea.  It was noted that she had been complaining of dyspnea since being quarantined during COVID-19 pandemic.  Her echocardiogram 09/10/2019 showed normal LV function, and severe isometric left ventricular hypertrophy without Sam.  She was started on low-dose verapamil by Dr. Gwenlyn Found on 9/20.  She was seen by Almyra Deforest, PA-C on 10/21/2019 and noted to have +1 pitting edema in her bilateral lower extremities.  Her HCTZ was stopped and she was placed on low-dose furosemide 10 mg daily.  She was then seen by Dr. Gwenlyn Found 12/20.  During that time her diltiazem was stopped and her verapamil was increased.  Her Lasix was also increased to 20 mg.  Her renal function decreased and her furosemide was reduced to 10 mg daily.  She was last seen by Almyra Deforest, PA-C 01/17/2020.  She was accompanied by her daughter.  She continued to have lower extremity edema.  It was felt that this was related to some venous stasis insufficiency.  Her lower extremity edema was nonpitting in nature.  She denied chest pain and dizziness.  She still had dyspnea on exertion which was related to deconditioning.  She has started doing exercise classes and more strenuous activity.  She was planning to receive her COVID-19 vaccination.  She was stable from a cardiac standpoint.  She presents virtually today for follow-up evaluation states she feels well.  She has been monitoring her blood pressure at home and routinely gets blood pressures in the 130s over 80s.  She is increasing her physical activity with group exercise twice per week.  She attends classes for 30-40 minutes at a  time.  She states that she continues to have some dyspnea with exertion but is able to complete the exercise classes without too much trouble with her breathing.  She also continues to have lower extremity swelling but she feels it is improved with her continued Lasix.  She reports compliance with lower extremity support stockings and tries to eat low-sodium food options.  I will give her a blood pressure log, salty 6 diet sheet, and have her follow-up in 6 months.  Today she denies chest pain, increased shortness of breath, increased lower extremity  edema, increased fatigue, palpitations, melena, hematuria, hemoptysis, diaphoresis, weakness, presyncope, syncope, orthopnea, and PND.     The patient does not symptoms concerning for COVID-19 infection (fever, chills, cough, or new SHORTNESS OF BREATH).    Prior CV studies:   The following studies were reviewed today:  Echocardiogram 09/10/2019 IMPRESSIONS    1. The left ventricle has normal systolic function with an ejection  fraction of 60-65%. The cavity size was normal. There is severe asymmetric  left ventricular hypertrophy of the basal septal wall. Left ventricular  diastolic Doppler parameters are  consistent with impaired relaxation.  2. LVOT turbulence seen in systole without mitral valve SAM.  3. The right ventricle has mildly reduced systolic function. The cavity  was small. There is no increase in right ventricular wall thickness. Right  ventricular systolic pressure is normal.  4. Left atrial size was mildly dilated.  5. Mild thickening of the mitral valve leaflet. Mild calcification of the  mitral valve leaflet. No evidence of mitral valve stenosis.  6. The aortic valve is tricuspid. Mild thickening of the aortic valve.  Mild calcification of the aortic valve. Aortic valve regurgitation is mild  by color flow Doppler.  7. The aorta is normal unless otherwise noted.  8. The aortic root, ascending aorta and aortic  arch are normal in size  and structure.   Past Medical History:  Diagnosis Date  . Acute upper GI bleeding 03/12/2020  . Arthritis   . Depression   . History of anemia   . History of chicken pox   . History of ulcer disease 1990s  . HLD (hyperlipidemia)   . HTN (hypertension)   . Hypothyroidism   . Osteoporosis 03/2016   at [facility] - T -3.1 hip, -1.0 spine  . Uses hearing aid    Past Surgical History:  Procedure Laterality Date  . APPENDECTOMY  1974  . BACK SURGERY  1979  . BIOPSY  03/13/2020   Procedure: BIOPSY;  Surgeon: Ladene Artist, MD;  Location: [facility];  Service: Endoscopy;;  . CARDIOVASCULAR STRESS TEST  2010   WNL  . CARPAL TUNNEL RELEASE Left 04/28/2017  . CATARACT EXTRACTION Bilateral 2012  . COLONOSCOPY WITH PROPOFOL N/A 03/14/2020   single bleeding colonic angiectasia treated with APC (Pyrtle, Lajuan Lines, MD)  . DEXA  03/2016   at [facility] - T -3.1 hip, -1.0 spine  . ESOPHAGOGASTRODUODENOSCOPY (EGD) WITH PROPOFOL N/A 03/13/2020   erosive gastropathy with stigmata of recent bleeding Fuller Plan, Pricilla Riffle, MD)  . GALLBLADDER SURGERY  1974  . HOT HEMOSTASIS N/A 03/14/2020  Procedure: HOT HEMOSTASIS (ARGON PLASMA COAGULATION/BICAP);  Surgeon: Jerene Bears, MD;  Location[facility] ENDOSCOPY;  Service: Gastroenterology;  Laterality: N/A;  . [place]   right  . [place]   left  . KNEE SURGERY  2008  . POLYPECTOMY  03/14/2020   Procedure: POLYPECTOMY;  Surgeon: Jerene Bears, MD;  Location: [facility] ENDOSCOPY;  Service: Gastroenterology;;  . TONSILLECTOMY  1945  . US ECHOCARDIOGRAPHY  2010   LVEF 65-75%, overall normal, mildly decreased LV diastolic compliance     Current Meds  Medication Sig  . acetaminophen (TYLENOL) 500 MG tablet Take 1,000 mg by mouth in the morning and at bedtime.   . cholecalciferol (VITAMIN D) 1000 UNITS tablet Take 1,000 Units by mouth daily.  Marland Kitchen denosumab (PROLIA) 60 MG/ML SOSY injection Inject 60 mg into the skin every 6 (six) months.    . diphenhydramine-acetaminophen (TYLENOL PM) 25-500 MG TABS Take 2 tablets by mouth at bedtime as needed (sleep).   Mariane Baumgarten Sodium (STOOL SOFTENER LAXATIVE PO) Take 1 Dose by mouth in the morning and at bedtime.  . famotidine (PEPCID) 20 MG tablet Take 20 mg by mouth daily.  Marland Kitchen FLUoxetine (PROZAC) 40 MG capsule Take 1 capsule (40 mg total) by mouth daily.  . furosemide (LASIX) 20 MG tablet TAKE 1/2 TABLET  DAILY  . hydrOXYzine (ATARAX/VISTARIL) 10 MG tablet Take 0.5-1 tablets (5-10 mg total) by mouth 2 (two) times daily as needed for anxiety.  Marland Kitchen levothyroxine (SYNTHROID) 88 MCG tablet TAKE 1 TABLET BY MOUTH EVERY DAY BEFORE BREAKFAST  . lisinopril (ZESTRIL) 40 MG tablet Take 1 tablet (40 mg total) by mouth 2 (two) times daily.  Marland Kitchen loratadine (CLARITIN) 10 MG tablet Take 1 tablet (10 mg total) by mouth every evening.  . mirabegron ER (MYRBETRIQ) 25 MG TB24 tablet TAKE 1 TABLET(25 MG) BY MOUTH DAILY  . omeprazole (PRILOSEC) 40 MG capsule TAKE 1 CAPSULE(40 MG) BY MOUTH DAILY  . pravastatin (PRAVACHOL) 40 MG tablet TAKE 1 TABLET BY MOUTH EVERY DAY  . verapamil (CALAN-SR) 240 MG CR tablet TAKE 1 TABLET(240 MG) BY MOUTH AT BEDTIME  . vitamin B-12 (CYANOCOBALAMIN) 500 MCG tablet Take 1 tablet (500 mcg total) by mouth once a week.     Allergies:   Patient has no known allergies.   Social History   Tobacco Use  . Smoking status: Former Smoker    Quit date: 12/30/1992    Years since quitting: 27.8  . Smokeless tobacco: Never Used  Vaping Use  . Vaping Use: Never used  Substance Use Topics  . Alcohol use: No  . Drug use: No     Family Hx: The patient's family history includes COPD in her sister; Cancer in her father; Cancer (age of onset: 58) in her sister; Cancer (age of onset: 53) in her sister; Cancer (age of onset: 31) in her mother; Cancer (age of onset: 70) in her brother; Cancer (age of onset: 74) in her sister; Diabetes in her daughter; Hypertension in her daughter. There is no history  of CAD or Stroke.  ROS:   Please see the history of present illness.     All other systems reviewed and are negative.   Labs/Other Tests and Data Reviewed:    Recent Labs: 03/12/2020: B Natriuretic Peptide 135.3 10/04/2020: ALT 8; BUN 18; Creatinine, Ser 1.46; Hemoglobin 11.8; Magnesium 2.0; Platelets 220.0; Potassium 4.5; Sodium 141; TSH 2.77   Recent Lipid Panel Lab Results  Component Value Date/Time  CHOL 156 10/04/2020 09:53 AM   TRIG 120.0 10/04/2020 09:53 AM   HDL 61.40 10/04/2020 09:53 AM   CHOLHDL 3 10/04/2020 09:53 AM   LDLCALC 71 10/04/2020 09:53 AM    Wt Readings from Last 3 Encounters:  11/01/20 230 lb (104.3 kg)  10/11/20 230 lb 2 oz (104.4 kg)  09/21/20 228 lb (103.4 kg)     Exam:    Vital Signs:  BP (!) 156/88   Pulse 88   Ht 5' 2.5" (1.588 m)   Wt 230 lb (104.3 kg)   BMI 41.40 kg/m    Well nourished, well developed female in no  acute distress.   ASSESSMENT & PLAN:    1.  Essential hypertension-BP today 156/88.  Well-controlled at home 130s over 80s. Continue lisinopril, verapamil Heart healthy low-sodium diet-salty 6 given Increase physical activity as tolerated  Lower extremity edema-no increased lower extremity edema.  Stable with current Lasix dosing.  Previously felt to be multifactorial including venous stasis insufficiency Continue furosemide Heart healthy low-sodium diet-salty 6 given Increase physical activity as tolerated Lower extremity support stockings  Hyperlipidemia- LDL 10/04/2020: Cholesterol 156; HDL 61.40; LDL Cholesterol 71; Triglycerides 120.0; VLDL 24.0 Continue pravastatin Heart healthy low-sodium high-fiber diet Increase physical activity as tolerated  Chronic renal insufficiency-creatinine 1.46 on 10/04/2020. Continue reduced dose of furosemide Follows with PCP  Disposition: Follow-up with Dr. Gwenlyn Found or me in 6 months.   COVID-19 Education: The signs and symptoms of COVID-19 were discussed with the patient and  how to seek care for testing (follow up with PCP or arrange E-visit).  The importance of social distancing was discussed today.  Patient Risk:   After full review of this patients clinical status, I feel that they are at least moderate risk at this time.  Time:   Today, I have spent 10 minutes with the patient with telehealth technology discussing medications, blood pressure, lower extremity swelling, diet, exercise.  I spent greater than 20 minutes reviewing patient's past medical history, previous cardiac tests and prior cardiac notes.   Medication Adjustments/Labs and Tests Ordered: Current medicines are reviewed at length with the patient today.  Concerns regarding medicines are outlined above.   Tests Ordered: No orders of the defined types were placed in this encounter.  Medication Changes: No orders of the defined types were placed in this encounter.   Disposition:  in 6 month(s)  Signed, Jossie Ng. Jaryn Rosko NP-C    08/03/2019 11:58 AM    [place] [address] Office [phone] Fax [phone]

## 2020-11-01 ENCOUNTER — Encounter: Payer: Self-pay | Admitting: General Practice

## 2020-11-01 ENCOUNTER — Telehealth (INDEPENDENT_AMBULATORY_CARE_PROVIDER_SITE_OTHER): Payer: Medicare HMO | Admitting: General Practice

## 2020-11-01 VITALS — BP 156/88 | HR 88 | Ht 62.5 in | Wt 230.0 lb

## 2020-11-01 DIAGNOSIS — I10 Essential (primary) hypertension: Secondary | ICD-10-CM

## 2020-11-01 DIAGNOSIS — E782 Mixed hyperlipidemia: Secondary | ICD-10-CM

## 2020-11-01 DIAGNOSIS — N189 Chronic kidney disease, unspecified: Secondary | ICD-10-CM | POA: Diagnosis not present

## 2020-11-01 DIAGNOSIS — M7989 Other specified soft tissue disorders: Secondary | ICD-10-CM

## 2020-11-01 NOTE — Patient Instructions (Signed)
Medication Instructions:  The current medical regimen is effective;  continue present plan and medications as directed. Please refer to the Current Medication list given to you today.  *If you need a refill on your cardiac medications before your next appointment, please call your pharmacy*  Lab Work:   Testing/Procedures:  NONE    NONE  Special Instructions TAKE AND LOG YOUR BLOOD PRESSURE 2-3 TIMES WEEKLY  PLEASE READ AND FOLLOW SALTY 6- 1,800MG  LIMIT DAILY-ATTACHED  PLEASE CONTINUE PHYSICAL ACTIVITY AS TOLERATED  Follow-Up: Your next appointment:  6 month(s) Virtual Visit  with Quay Burow, MD OR IF UNAVAILABLE [place], FNP-C  At [facility], you and your health needs are our priority.  As part of our continuing mission to provide you with exceptional heart care, we have created designated Provider Care Teams.  These Care Teams include your primary Cardiologist (physician) and Advanced Practice Providers (APPs -  Physician Assistants and Nurse Practitioners) who all work together to provide you with the care you need, when you need it.            6 SALTY THINGS TO AVOID     1,800MG  DAILY

## 2020-11-02 ENCOUNTER — Other Ambulatory Visit: Payer: Self-pay | Admitting: Family Medicine

## 2020-11-06 ENCOUNTER — Other Ambulatory Visit: Payer: Self-pay | Admitting: Family Medicine

## 2020-11-08 ENCOUNTER — Telehealth: Payer: Self-pay

## 2020-11-08 DIAGNOSIS — H5203 Hypermetropia, bilateral: Secondary | ICD-10-CM | POA: Diagnosis not present

## 2020-11-08 NOTE — Telephone Encounter (Signed)
[place] [facility] - Client Nonclinical Telephone Record AccessNurse Client [place] [facility] - Client Client Site [place] Physician [name] - MD Contact Type Call Who Is Calling Patient / Member / Family / Caregiver Caller Name [name] Phone Number [phone] Patient Name [name] Patient DOB August 02, 1930 Call Type Message Only Information Provided Reason for Call Request for General Office Information Initial Comment Caller states she changed her pharmacy to mail order. It has not shipped because they need the doctor to call them so they can mail it. She states she is out of a few of them and does not know what to do. Please have Dr. Synthia Innocent nurse call her. Disp. Time Disposition Final User 11/07/2020 5:56:37 PM General Information Provided Yes Kathlynn Grate Call Closed By: Kathlynn Grate Transaction Date/Time: 11/07/2020 5:51:58 PM (ET)

## 2020-11-08 NOTE — Telephone Encounter (Signed)
Spoke with pt notifying her refills were sent recently to [facility] order.  Says she actually got a shipment today but not all that she ordered.  Suggested if she doesn't receive the rest in the next couple of days to call here or [place].  Pt verbalizes understanding and expresses her thanks.

## 2020-11-14 ENCOUNTER — Telehealth: Payer: Self-pay

## 2020-11-14 NOTE — Telephone Encounter (Signed)
Pts daughter, [name], reports her mother's medications were messed up with [facility] mail order pharmacy but she has it straight now because 6 more came in this afternoon. She still did not get the refill for furosemide in but she thinks she will get that tomorrow. Advised of out of pocket price if necessary and to contact physician that prescribed it if they need it sent to another pharmacy. Advised if anything else is needed to contact this office. [name] is very appreciative and verbalized understanding.

## 2020-12-05 DIAGNOSIS — Z1231 Encounter for screening mammogram for malignant neoplasm of breast: Secondary | ICD-10-CM | POA: Diagnosis not present

## 2020-12-05 LAB — HM MAMMOGRAPHY

## 2020-12-11 ENCOUNTER — Encounter: Payer: Self-pay | Admitting: Family Medicine

## 2021-01-20 ENCOUNTER — Telehealth: Payer: Self-pay

## 2021-01-20 DIAGNOSIS — M810 Age-related osteoporosis without current pathological fracture: Secondary | ICD-10-CM

## 2021-01-20 NOTE — Telephone Encounter (Signed)
Last prolia inj was 07/27/20. Due for next prolia after 01/28/21. Need to run benefits and inquire if PA is needed.  Then can schedule pt for lab and NV.

## 2021-01-25 NOTE — Telephone Encounter (Signed)
Benefits submitted pending response

## 2021-01-31 NOTE — Telephone Encounter (Signed)
Benefits still pending rep is going to follow up and let me know delay.

## 2021-02-09 NOTE — Telephone Encounter (Signed)
Auth received and benefits patient will have $275 out of pocket  auth number [id number] effective 02/21/2021 will receive fax with end date and all other information.

## 2021-02-13 NOTE — Telephone Encounter (Signed)
Last prolia on 07/27/20. Next prolia due after 01/30/21 but PA is dated for 02/21/21 to 12/29/21  Contacted pt's daughter, [name], and advised pt will be due for prolia this month but can't get it until after 2/23 due to PA. [name] reported that pt said she had someone at the house today that changed her insurance from [employer] to [place]. She is not sure what has been done with the insurance and she cannot make it to the pts house until tomorrow. She will f/u with this office.

## 2021-02-15 NOTE — Telephone Encounter (Signed)
Pt daughter called in and returning the phone call

## 2021-02-20 NOTE — Telephone Encounter (Signed)
Pt's daughter, [name], returned call and reported pt's insurance has not changed. Scheduled prolia lab for 3/2 and nurse visit for prolia inj on 3/9. [name] verbalized understanding.

## 2021-02-26 DIAGNOSIS — H02836 Dermatochalasis of left eye, unspecified eyelid: Secondary | ICD-10-CM | POA: Diagnosis not present

## 2021-02-26 DIAGNOSIS — Z961 Presence of intraocular lens: Secondary | ICD-10-CM | POA: Diagnosis not present

## 2021-02-26 DIAGNOSIS — H26493 Other secondary cataract, bilateral: Secondary | ICD-10-CM | POA: Diagnosis not present

## 2021-02-26 DIAGNOSIS — H02833 Dermatochalasis of right eye, unspecified eyelid: Secondary | ICD-10-CM | POA: Diagnosis not present

## 2021-02-26 DIAGNOSIS — H18523 Epithelial (juvenile) corneal dystrophy, bilateral: Secondary | ICD-10-CM | POA: Diagnosis not present

## 2021-02-28 ENCOUNTER — Other Ambulatory Visit (INDEPENDENT_AMBULATORY_CARE_PROVIDER_SITE_OTHER): Payer: Medicare HMO

## 2021-02-28 ENCOUNTER — Other Ambulatory Visit: Payer: Self-pay

## 2021-02-28 DIAGNOSIS — M810 Age-related osteoporosis without current pathological fracture: Secondary | ICD-10-CM

## 2021-02-28 LAB — BASIC METABOLIC PANEL
BUN: 21 mg/dL (ref 6–23)
CO2: 30 mEq/L (ref 19–32)
Calcium: 10.3 mg/dL (ref 8.4–10.5)
Chloride: 104 mEq/L (ref 96–112)
Creatinine, Ser: 1.51 mg/dL — ABNORMAL HIGH (ref 0.40–1.20)
GFR: 30.63 mL/min — ABNORMAL LOW (ref >60.00)
Glucose, Bld: 82 mg/dL (ref 70–99)
Potassium: 4.3 mEq/L (ref 3.5–5.1)
Sodium: 140 mEq/L (ref 135–145)

## 2021-03-05 NOTE — Addendum Note (Signed)
Addended by: Randall An on: 03/05/2021 09:45 AM   Modules accepted: Orders

## 2021-03-05 NOTE — Telephone Encounter (Signed)
Ca in normal range and CrCl is 42.4 mL/min. Pt is scheduled for prolia inj on 03/07/21. Per Dr. Darnell Level, pt needs another BMP 10 days after inj.  Contacted pt's daughter, [name], and scheduled pt for lab apt for 3/18. [name] verbalized understanding.   Added lab order

## 2021-03-07 ENCOUNTER — Ambulatory Visit (INDEPENDENT_AMBULATORY_CARE_PROVIDER_SITE_OTHER): Payer: Medicare HMO

## 2021-03-07 DIAGNOSIS — M810 Age-related osteoporosis without current pathological fracture: Secondary | ICD-10-CM | POA: Diagnosis not present

## 2021-03-07 MED ORDER — DENOSUMAB 60 MG/ML ~~LOC~~ SOSY
60.0000 mg | PREFILLED_SYRINGE | Freq: Once | SUBCUTANEOUS | Status: AC
  Administered 2021-03-07: 60 mg via SUBCUTANEOUS

## 2021-03-07 NOTE — Progress Notes (Signed)
Per orders of Dr. Gutierrez, injection of Prolia, given by Christina G Odee. Patient tolerated injection well.  

## 2021-03-16 ENCOUNTER — Other Ambulatory Visit (INDEPENDENT_AMBULATORY_CARE_PROVIDER_SITE_OTHER): Payer: Medicare HMO

## 2021-03-16 ENCOUNTER — Telehealth: Payer: Self-pay | Admitting: *Deleted

## 2021-03-16 ENCOUNTER — Other Ambulatory Visit: Payer: Self-pay

## 2021-03-16 DIAGNOSIS — M810 Age-related osteoporosis without current pathological fracture: Secondary | ICD-10-CM | POA: Diagnosis not present

## 2021-03-16 LAB — BASIC METABOLIC PANEL
BUN: 16 mg/dL (ref 6–23)
CO2: 25 mEq/L (ref 19–32)
Calcium: 8.3 mg/dL — ABNORMAL LOW (ref 8.4–10.5)
Chloride: 111 mEq/L (ref 96–112)
Creatinine, Ser: 1.19 mg/dL (ref 0.40–1.20)
GFR: 40.75 mL/min — ABNORMAL LOW (ref >60.00)
Glucose, Bld: 86 mg/dL (ref 70–99)
Potassium: 4.3 mEq/L (ref 3.5–5.1)
Sodium: 143 mEq/L (ref 135–145)

## 2021-03-16 NOTE — Telephone Encounter (Signed)
Pt was here for labs and pt's daughter brought a letter from [facility] saying that it's cheaper to switch pt from myrbetriq to oxybutynin XR or Oxybutyjnin, they are both covered with little to no co-pay. If okay to change Rx please send to [facility] mail order  Please call [name] (daughter) [phone] to let her know

## 2021-03-20 NOTE — Telephone Encounter (Signed)
Don't recommend switch as other medication options can cause more side effects including dry mouth, unsteadiness, memory difficulty.  If able to afford myrbetriq, recommend continue this.

## 2021-03-21 NOTE — Telephone Encounter (Signed)
Patient's daughter [name] notified as instructed by telephone. Advised patient's daughter she may want to check GoodRx out and see if she can get a better price. [name] stated that she is okay with her mom staying on her current medication because she does not want her to have more side effects.

## 2021-03-26 DIAGNOSIS — H18593 Other hereditary corneal dystrophies, bilateral: Secondary | ICD-10-CM | POA: Diagnosis not present

## 2021-03-26 DIAGNOSIS — H26493 Other secondary cataract, bilateral: Secondary | ICD-10-CM | POA: Diagnosis not present

## 2021-03-26 DIAGNOSIS — H02836 Dermatochalasis of left eye, unspecified eyelid: Secondary | ICD-10-CM | POA: Diagnosis not present

## 2021-03-26 DIAGNOSIS — H02833 Dermatochalasis of right eye, unspecified eyelid: Secondary | ICD-10-CM | POA: Diagnosis not present

## 2021-03-26 DIAGNOSIS — Z961 Presence of intraocular lens: Secondary | ICD-10-CM | POA: Diagnosis not present

## 2021-03-26 NOTE — Progress Notes (Unsigned)
Error

## 2021-03-27 ENCOUNTER — Telehealth: Payer: Medicare HMO | Admitting: General Practice

## 2021-04-03 ENCOUNTER — Telehealth: Payer: Medicare HMO | Admitting: General Practice

## 2021-04-05 ENCOUNTER — Other Ambulatory Visit: Payer: Self-pay | Admitting: Cardiovascular Disease

## 2021-04-11 ENCOUNTER — Other Ambulatory Visit: Payer: Self-pay

## 2021-04-11 ENCOUNTER — Telehealth: Payer: Self-pay

## 2021-04-11 ENCOUNTER — Encounter: Payer: Self-pay | Admitting: Family Medicine

## 2021-04-11 ENCOUNTER — Ambulatory Visit (INDEPENDENT_AMBULATORY_CARE_PROVIDER_SITE_OTHER): Payer: Medicare HMO | Admitting: Family Medicine

## 2021-04-11 VITALS — BP 156/72 | HR 75 | Temp 98.0°F | Ht 62.5 in | Wt 220.3 lb

## 2021-04-11 DIAGNOSIS — N3941 Urge incontinence: Secondary | ICD-10-CM

## 2021-04-11 DIAGNOSIS — M810 Age-related osteoporosis without current pathological fracture: Secondary | ICD-10-CM | POA: Diagnosis not present

## 2021-04-11 DIAGNOSIS — R0609 Other forms of dyspnea: Secondary | ICD-10-CM

## 2021-04-11 DIAGNOSIS — N1832 Chronic kidney disease, stage 3b: Secondary | ICD-10-CM | POA: Diagnosis not present

## 2021-04-11 DIAGNOSIS — K5521 Angiodysplasia of colon with hemorrhage: Secondary | ICD-10-CM

## 2021-04-11 DIAGNOSIS — H18523 Epithelial (juvenile) corneal dystrophy, bilateral: Secondary | ICD-10-CM | POA: Diagnosis not present

## 2021-04-11 DIAGNOSIS — R0600 Dyspnea, unspecified: Secondary | ICD-10-CM | POA: Diagnosis not present

## 2021-04-11 DIAGNOSIS — I10 Essential (primary) hypertension: Secondary | ICD-10-CM

## 2021-04-11 DIAGNOSIS — D500 Iron deficiency anemia secondary to blood loss (chronic): Secondary | ICD-10-CM | POA: Diagnosis not present

## 2021-04-11 DIAGNOSIS — D649 Anemia, unspecified: Secondary | ICD-10-CM

## 2021-04-11 NOTE — Patient Instructions (Addendum)
Labs today  Congratulations on weight loss to date.  Continue current medicines for now.  Return in 6 months for wellness visit/physical.

## 2021-04-11 NOTE — Telephone Encounter (Signed)
3/29   153/85   3pm 3/30    141/78   2pm 3/31    151/78   4pm 4/1      169/87, HR, 90, 8pm 4/2      135/72,  HR 79,  5pm 4/3      148/80, HR 79, 7pm 4/4      164/86, HR 80, 7pm 4/5      143/69, HR 79, 8pm 5/6      157/83, HR 86, 9pm 4/7        159/778, HR 73, 7pm 4/8        140/67, no HR taken, 8pm 4/9   152/98, 109, no time taken, reported pt was sick with nausea/vomiting and diarrhea this day to to overeating 4/11    149/79, HR 83, no time taken 4/12   152/79, HR 81

## 2021-04-11 NOTE — Progress Notes (Signed)
Patient ID: [name], female    DOB: 10/30/1930, 87 y.o.   MRN: [id number]  This visit was conducted in person.  BP (!) 156/72 (BP Location: Right Arm, Cuff Size: Large)   Pulse 75   Temp 98 F (36.7 C) (Temporal)   Ht 5' 2.5" (1.588 m)   Wt 220 lb 5 oz (99.9 kg)   SpO2 98%   BMI 39.65 kg/m   BP Readings from Last 3 Encounters:  04/11/21 (!) 156/72  11/01/20 (!) 156/88  10/11/20 134/72    CC: 6 mo f/u visit  Subjective:   HPI: [name] is a 87 y.o. female presenting on 04/11/2021 for Follow-up (Here for 6 mo f/u.  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 Just had YAG of R eye for posterior capsular opacification (03/26/2021). Vision has improved with this. Planning L eye.   IDA saw GI s/p EGD and colonoscopy - single bleeding colonic angiectasia s/p APC. Also found erosive gastropathy with stigmata of bleeding. She received IV iron 2021 as well as 1u blood transfusion. Plan PCP continue to monitor this, refer back to GI if needed. Continues pepcid and omeprazole daily. No longer on iron replacement.   OP - just received prolia injection 02/2021. H/o hypercalcemia, latest calcium level low.   HTN - BP elevated today despite lisinopril 40mg  bid and verapamil 240mg  daily. She also continues lasix 10mg  daily. Checking BP at home - but didn't bring log - but states home readings running 150/70s. No HA, vision changes, CP/tightness, leg swelling. Stays dyspneic.   Urinary incontinence- on myrbetriq which is expensive ($120/66mo)      Relevant past medical, surgical, family and social history reviewed and updated as indicated. Interim medical history since our last visit reviewed. Allergies and medications reviewed and updated. Outpatient Medications Prior to Visit  Medication Sig Dispense Refill  . acetaminophen (TYLENOL) 500 MG tablet  Take 1,000 mg by mouth in the morning and at bedtime.     . cholecalciferol (VITAMIN D) 1000 UNITS tablet Take 1,000 Units by mouth daily.    Marland Kitchen denosumab (PROLIA) 60 MG/ML SOSY injection Inject 60 mg into the skin every 6 (six) months.    . diphenhydramine-acetaminophen (TYLENOL PM) 25-500 MG TABS Take 2 tablets by mouth at bedtime as needed (sleep).     Mariane Baumgarten Sodium (STOOL SOFTENER LAXATIVE PO) Take 1 Dose by mouth in the morning and at bedtime.    . famotidine (PEPCID) 20 MG tablet Take 20 mg by mouth daily.    Marland Kitchen FLUoxetine (PROZAC) 40 MG capsule TAKE 1 CAPSULE EVERY DAY (REPLACES TABLETS) 90 capsule 3  . furosemide (LASIX) 20 MG tablet TAKE 1/2 TABLET  DAILY 45 tablet 1  . hydrOXYzine (ATARAX/VISTARIL) 10 MG tablet Take 0.5-1 tablets (5-10 mg total) by mouth 2 (two) times daily as needed for anxiety. 30 tablet 0  . levothyroxine (SYNTHROID) 88 MCG tablet TAKE 1 TABLET EVERY DAY BEFORE BREAKFAST 90 tablet 3  . lisinopril (ZESTRIL) 40 MG tablet TAKE 1 TABLET TWICE DAILY 180 tablet 3  . loratadine (CLARITIN) 10 MG tablet Take 1 tablet (10 mg total) by mouth every evening. 90 tablet 3  . MYRBETRIQ 25 MG TB24 tablet TAKE 1 TABLET EVERY DAY 90 tablet 3  . omeprazole (PRILOSEC) 40 MG capsule TAKE 1 CAPSULE EVERY DAY 90 capsule 3  . pravastatin (  PRAVACHOL) 40 MG tablet TAKE 1 TABLET EVERY DAY 90 tablet 3  . verapamil (CALAN-SR) 240 MG CR tablet TAKE 1 TABLET(240 MG) BY MOUTH AT BEDTIME 90 tablet 1  . vitamin B-12 (CYANOCOBALAMIN) 500 MCG tablet Take 1 tablet (500 mcg total) by mouth once a week.     No facility-administered medications prior to visit.     Per HPI unless specifically indicated in ROS section below Review of Systems Objective:  BP (!) 156/72 (BP Location: Right Arm, Cuff Size: Large)   Pulse 75   Temp 98 F (36.7 C) (Temporal)   Ht 5' 2.5" (1.588 m)   Wt 220 lb 5 oz (99.9 kg)   SpO2 98%   BMI 39.65 kg/m   Wt Readings from Last 3 Encounters:  04/11/21 220 lb 5 oz (99.9  kg)  11/01/20 230 lb (104.3 kg)  10/11/20 230 lb 2 oz (104.4 kg)      Physical Exam Vitals and nursing note reviewed.  Constitutional:      Appearance: Normal appearance. She is not ill-appearing.  Eyes:     Extraocular Movements: Extraocular movements intact.     Pupils: Pupils are equal, round, and reactive to light.  Cardiovascular:     Rate and Rhythm: Normal rate and regular rhythm.     Pulses: Normal pulses.     Heart sounds: Normal heart sounds. No murmur heard.   Pulmonary:     Effort: Pulmonary effort is normal. No respiratory distress.     Breath sounds: Normal breath sounds. No wheezing, rhonchi or rales.  Musculoskeletal:     Right lower leg: No edema.     Left lower leg: No edema.  Skin:    General: Skin is warm and dry.     Findings: No rash.  Neurological:     Mental Status: She is alert.  Psychiatric:        Mood and Affect: Mood normal.        Behavior: Behavior normal.       Results for orders placed or performed in visit on 04/11/21  Renal function panel  Result Value Ref Range   Sodium 141 135 - 145 mEq/L   Potassium 4.2 3.5 - 5.1 mEq/L   Chloride 112 96 - 112 mEq/L   CO2 22 19 - 32 mEq/L   Albumin 3.5 3.5 - 5.2 g/dL   BUN 17 6 - 23 mg/dL   Creatinine, Ser 1.30 (H) 0.40 - 1.20 mg/dL   Glucose, Bld 82 70 - 99 mg/dL   Phosphorus 2.2 (L) 2.3 - 4.6 mg/dL   GFR 36.63 (L) >60.00 mL/min   Calcium 8.3 (L) 8.4 - 10.5 mg/dL  CBC with Differential/Platelet  Result Value Ref Range   WBC 4.1 4.0 - 10.5 K/uL   RBC 3.91 3.87 - 5.11 Mil/uL   Hemoglobin 11.1 (L) 12.0 - 15.0 g/dL   HCT 33.9 (L) 36.0 - 46.0 %   MCV 86.8 78.0 - 100.0 fl   MCHC 32.6 30.0 - 36.0 g/dL   RDW 14.6 11.5 - 15.5 %   Platelets 230.0 150.0 - 400.0 K/uL   Neutrophils Relative % 63.6 43.0 - 77.0 %   Lymphocytes Relative 20.8 12.0 - 46.0 %   Monocytes Relative 12.1 (H) 3.0 - 12.0 %   Eosinophils Relative 1.9 0.0 - 5.0 %   Basophils Relative 1.6 0.0 - 3.0 %   Neutro Abs 2.6 1.4 - 7.7  K/uL   Lymphs Abs 0.8 0.7 - 4.0 K/uL  Monocytes Absolute 0.5 0.1 - 1.0 K/uL   Eosinophils Absolute 0.1 0.0 - 0.7 K/uL   Basophils Absolute 0.1 0.0 - 0.1 K/uL  Ferritin  Result Value Ref Range   Ferritin 124.6 10.0 - 291.0 ng/mL  IBC panel  Result Value Ref Range   Iron 48 42 - 145 ug/dL   Transferrin 165.0 (L) 212.0 - 360.0 mg/dL   Saturation Ratios 20.8 20.0 - 50.0 %   Assessment & Plan:  This visit occurred during the SARS-CoV-2 public health emergency.  Safety protocols were in place, including screening questions prior to the visit, additional usage of staff PPE, and extensive cleaning of exam room while observing appropriate contact time as indicated for disinfecting solutions.   Problem List Items Addressed This Visit    Hypertension - Primary    BP elevated today despite regularly taking high dose lisinopril verapamil, and low dose lasix daily. Home readings are running high as well - consider dropping lisinopril dose and adding beta blocker - check labwork prior to any changes.       Exertional dyspnea    Ongoing. Update CBC.       CKD (chronic kidney disease) stage 3, GFR 30-59 ml/min (HCC)    Update levels. Encouraged increased water intake.       Relevant Orders   Renal function panel (Completed)   Osteoporosis    Recent prolia injection 02/2021.  Update labs. Not on calcium in h/o hypercalcemia.  Need to be regular with Q62mo prolia given increased fracture risk treatment if delayed.      Urge incontinence    Continue myrbetriq which although expensive is beneficial.       Iron deficiency anemia    S/p pRBC x1u 02/2020, iron infusion x2 03/2020  Update levels on pepcid + omeprazole. Has stopped oral iron.       Relevant Orders   CBC with Differential/Platelet (Completed)   Ferritin (Completed)   IBC panel (Completed)   Angiodysplasia of colon with hemorrhage    Update iron and CBC off oral iron therapy.       Corneal epithelial basement membrane  dystrophy    Following with ophthalmology          No orders of the defined types were placed in this encounter.  Orders Placed This Encounter  Procedures  . Renal function panel  . CBC with Differential/Platelet  . Ferritin  . IBC panel    Patient Instructions  Labs today  Congratulations on weight loss to date.  Continue current medicines for now.  Return in 6 months for wellness visit/physical.    Follow up plan: Return in about 6 months (around 10/11/2021) for annual exam, prior fasting for blood work, medicare wellness visit.  [name], MD

## 2021-04-12 LAB — IBC PANEL
Iron: 48 ug/dL (ref 42–145)
Saturation Ratios: 20.8 % (ref 20.0–50.0)
Transferrin: 165 mg/dL — ABNORMAL LOW (ref 212.0–360.0)

## 2021-04-12 LAB — CBC WITH DIFFERENTIAL/PLATELET
Basophils Absolute: 0.1 10*3/uL (ref 0.0–0.1)
Basophils Relative: 1.6 % (ref 0.0–3.0)
Eosinophils Absolute: 0.1 10*3/uL (ref 0.0–0.7)
Eosinophils Relative: 1.9 % (ref 0.0–5.0)
HCT: 33.9 % — ABNORMAL LOW (ref 36.0–46.0)
Hemoglobin: 11.1 g/dL — ABNORMAL LOW (ref 12.0–15.0)
Lymphocytes Relative: 20.8 % (ref 12.0–46.0)
Lymphs Abs: 0.8 10*3/uL (ref 0.7–4.0)
MCHC: 32.6 g/dL (ref 30.0–36.0)
MCV: 86.8 fl (ref 78.0–100.0)
Monocytes Absolute: 0.5 10*3/uL (ref 0.1–1.0)
Monocytes Relative: 12.1 % — ABNORMAL HIGH (ref 3.0–12.0)
Neutro Abs: 2.6 10*3/uL (ref 1.4–7.7)
Neutrophils Relative %: 63.6 % (ref 43.0–77.0)
Platelets: 230 10*3/uL (ref 150.0–400.0)
RBC: 3.91 Mil/uL (ref 3.87–5.11)
RDW: 14.6 % (ref 11.5–15.5)
WBC: 4.1 10*3/uL (ref 4.0–10.5)

## 2021-04-12 LAB — RENAL FUNCTION PANEL
Albumin: 3.5 g/dL (ref 3.5–5.2)
BUN: 17 mg/dL (ref 6–23)
CO2: 22 mEq/L (ref 19–32)
Calcium: 8.3 mg/dL — ABNORMAL LOW (ref 8.4–10.5)
Chloride: 112 mEq/L (ref 96–112)
Creatinine, Ser: 1.3 mg/dL — ABNORMAL HIGH (ref 0.40–1.20)
GFR: 36.63 mL/min — ABNORMAL LOW (ref >60.00)
Glucose, Bld: 82 mg/dL (ref 70–99)
Phosphorus: 2.2 mg/dL — ABNORMAL LOW (ref 2.3–4.6)
Potassium: 4.2 mEq/L (ref 3.5–5.1)
Sodium: 141 mEq/L (ref 135–145)

## 2021-04-12 LAB — FERRITIN: Ferritin: 124.6 ng/mL (ref 10.0–291.0)

## 2021-04-14 DIAGNOSIS — H18529 Epithelial (juvenile) corneal dystrophy, unspecified eye: Secondary | ICD-10-CM | POA: Insufficient documentation

## 2021-04-14 MED ORDER — LISINOPRIL 40 MG PO TABS: 40.0000 mg | ORAL_TABLET | Freq: Every day | ORAL | 1 refills | Status: DC

## 2021-04-14 MED ORDER — CALCIUM GLUCONATE 500 MG PO TABS: 1.0000 | ORAL_TABLET | Freq: Every day | ORAL | Status: AC

## 2021-04-14 MED ORDER — METOPROLOL SUCCINATE ER 25 MG PO TB24: 25.0000 mg | ORAL_TABLET | Freq: Every day | ORAL | 6 refills | Status: DC

## 2021-04-14 NOTE — Assessment & Plan Note (Signed)
Update iron and CBC off oral iron therapy.

## 2021-04-14 NOTE — Assessment & Plan Note (Signed)
Update levels. Encouraged increased water intake.

## 2021-04-14 NOTE — Assessment & Plan Note (Signed)
BP elevated today despite regularly taking high dose lisinopril verapamil, and low dose lasix daily. Home readings are running high as well - consider dropping lisinopril dose and adding beta blocker - check labwork prior to any changes.

## 2021-04-14 NOTE — Assessment & Plan Note (Signed)
S/p pRBC x1u 02/2020, iron infusion x2 03/2020  Update levels on pepcid + omeprazole. Has stopped oral iron.

## 2021-04-14 NOTE — Assessment & Plan Note (Addendum)
Continue myrbetriq which although expensive is beneficial.

## 2021-04-14 NOTE — Assessment & Plan Note (Addendum)
Recent prolia injection 02/2021.  Update labs. Not on calcium in h/o hypercalcemia.  Need to be regular with Q78mo prolia given increased fracture risk treatment if delayed.

## 2021-04-14 NOTE — Assessment & Plan Note (Signed)
Following with ophthalmology. 

## 2021-04-14 NOTE — Assessment & Plan Note (Signed)
Ongoing. Update CBC.

## 2021-04-14 NOTE — Telephone Encounter (Signed)
Noted.  I recommend starting 1 calcium supplement daily as level were recently low, as well as changing blood pressure medicines - I'd like her to drop lisinopril dose to 40mg  once daily, and add on metoprolol XL 25mg  daily (new blood pressure medicine).  Let us know how blood pressures do with this.

## 2021-04-16 NOTE — Telephone Encounter (Signed)
Spoke with pt/pt's daughter, [name] (on dpr), relaying Dr. Synthia Innocent message.  They verbalize understanding.

## 2021-04-23 DIAGNOSIS — H18523 Epithelial (juvenile) corneal dystrophy, bilateral: Secondary | ICD-10-CM | POA: Diagnosis not present

## 2021-04-23 DIAGNOSIS — H02836 Dermatochalasis of left eye, unspecified eyelid: Secondary | ICD-10-CM | POA: Diagnosis not present

## 2021-04-23 DIAGNOSIS — H02833 Dermatochalasis of right eye, unspecified eyelid: Secondary | ICD-10-CM | POA: Diagnosis not present

## 2021-04-23 DIAGNOSIS — Z961 Presence of intraocular lens: Secondary | ICD-10-CM | POA: Diagnosis not present

## 2021-04-23 DIAGNOSIS — H26493 Other secondary cataract, bilateral: Secondary | ICD-10-CM | POA: Diagnosis not present

## 2021-05-02 ENCOUNTER — Encounter: Payer: Self-pay | Admitting: Physician Assistant

## 2021-05-02 ENCOUNTER — Telehealth: Payer: Self-pay

## 2021-05-02 ENCOUNTER — Telehealth (INDEPENDENT_AMBULATORY_CARE_PROVIDER_SITE_OTHER): Payer: Medicare HMO | Admitting: Physician Assistant

## 2021-05-02 VITALS — BP 143/68 | HR 64 | Ht 64.0 in | Wt 205.0 lb

## 2021-05-02 DIAGNOSIS — E039 Hypothyroidism, unspecified: Secondary | ICD-10-CM

## 2021-05-02 DIAGNOSIS — R600 Localized edema: Secondary | ICD-10-CM

## 2021-05-02 DIAGNOSIS — E785 Hyperlipidemia, unspecified: Secondary | ICD-10-CM | POA: Diagnosis not present

## 2021-05-02 DIAGNOSIS — I10 Essential (primary) hypertension: Secondary | ICD-10-CM | POA: Diagnosis not present

## 2021-05-02 NOTE — Progress Notes (Signed)
Virtual Visit via Telephone Note   This visit type was conducted due to national recommendations for restrictions regarding the COVID-19 Pandemic (e.g. social distancing) in an effort to limit this patient's exposure and mitigate transmission in our community.  Due to her co-morbid illnesses, this patient is at least at moderate risk for complications without adequate follow up.  This format is felt to be most appropriate for this patient at this time.  The patient did not have access to video technology/had technical difficulties with video requiring transitioning to audio format only (telephone).  All issues noted in this document were discussed and addressed.  No physical exam could be performed with this format.  Please refer to the patient's chart for her  consent to telehealth for [facility].    Date:  05/02/2021   ID:  [name], DOB Oct 14, 1930, MRN [id number] The patient was identified using 2 identifiers.  Patient Location: Home Provider Location: Office/Clinic   PCP:  [name], [place] Providers Cardiologist:  Quay Burow, MD     Evaluation Performed:  Follow-Up Visit  Chief Complaint:  Follow up  History of Present Illness:    [name] is a 87 y.o. female with hx of hypertension, hyperlipidemia, hypothyroidism and a history of anemia. She is very hard of hearing. She lives in an assisted living facility. She was referred to cardiology service for evaluation of dyspnea. She has never had a heart attack or stroke in the past.She has been complaining of dyspnea since being quarantined during COVID-19. Echocardiogram obtained on 09/10/2019 revealed a normal LV systolic function with severe isometric left ventricular hypertrophy of basal septal wall without SAM. She was started on low-dose verapamil by Dr. Gwenlyn Found on 09/15/2019.  I last saw the patient on 10/21/2019, she was having 1+ pitting edema in bilateral lower extremity.  I stopped her  hydrochlorothiazide and placed her on low-dose Lasix 10 mg daily.  She was seen by Dr. Gwenlyn Found in December 2020, diltiazem was stopped and her verapamil was increased.  Lasix was increased to 20 mg daily.  Renal function worsened after that, Lasix was decreased down to 10 mg daily again.  Some of her lower extremity edema was felt to be related to venous insufficiency.  She was last seen by Coletta Memos via virtual visit on 11/01/2020 at which time she has been doing well.  Patient was contacted today for virtual visit.  She does not have any recent chest discomfort or or worsening dyspnea.  She does not have any worsening lower extremity edema, orthopnea or PND.  I recommended continue on the current dose of diuretic.  The patient does not have symptoms concerning for COVID-19 infection (fever, chills, cough, or new shortness of breath).    Past Medical History:  Diagnosis Date  . Acute upper GI bleeding 03/12/2020  . Arthritis   . Depression   . History of anemia   . History of chicken pox   . History of ulcer disease 1990s  . HLD (hyperlipidemia)   . HTN (hypertension)   . Hypothyroidism   . Osteoporosis 03/2016   at [facility] - T -3.1 hip, -1.0 spine  . Uses hearing aid    Past Surgical History:  Procedure Laterality Date  . APPENDECTOMY  1974  . BACK SURGERY  1979  . BIOPSY  03/13/2020   Procedure: BIOPSY;  Surgeon: Ladene Artist, MD;  Location: [facility];  Service: Endoscopy;;  . CARDIOVASCULAR STRESS TEST  2010   WNL  . CARPAL TUNNEL RELEASE Left 04/28/2017  . CATARACT EXTRACTION Bilateral 2012  . COLONOSCOPY WITH PROPOFOL N/A 03/14/2020   single bleeding colonic angiectasia treated with APC (Pyrtle, Lajuan Lines, MD)  . DEXA  03/2016   at [facility] - T -3.1 hip, -1.0 spine  . ESOPHAGOGASTRODUODENOSCOPY (EGD) WITH PROPOFOL N/A 03/13/2020   erosive gastropathy with stigmata of recent bleeding Fuller Plan, Pricilla Riffle, MD)  . GALLBLADDER SURGERY  1974  . HOT HEMOSTASIS N/A 03/14/2020   Procedure:  HOT HEMOSTASIS (ARGON PLASMA COAGULATION/BICAP);  Surgeon: Jerene Bears, MD;  Location: [facility] ENDOSCOPY;  Service: Gastroenterology;  Laterality: N/A;  . [place]   right  . [place]   left  . KNEE SURGERY  2008  . POLYPECTOMY  03/14/2020   Procedure: POLYPECTOMY;  Surgeon: Jerene Bears, MD;  Location: [facility] ENDOSCOPY;  Service: Gastroenterology;;  . TONSILLECTOMY  1945  . US ECHOCARDIOGRAPHY  2010   LVEF 65-75%, overall normal, mildly decreased LV diastolic compliance     Current Meds  Medication Sig  . acetaminophen (TYLENOL) 500 MG tablet Take 1,000 mg by mouth in the morning and at bedtime.   . calcium gluconate 500 MG tablet Take 1 tablet (500 mg total) by mouth daily.  . cholecalciferol (VITAMIN D) 1000 UNITS tablet Take 1,000 Units by mouth daily.  Marland Kitchen denosumab (PROLIA) 60 MG/ML SOSY injection Inject 60 mg into the skin every 6 (six) months.  . diphenhydramine-acetaminophen (TYLENOL PM) 25-500 MG TABS Take 2 tablets by mouth at bedtime as needed (sleep).   Mariane Baumgarten Sodium (STOOL SOFTENER LAXATIVE PO) Take 1 Dose by mouth in the morning and at bedtime.  . famotidine (PEPCID) 20 MG tablet Take 20 mg by mouth daily.  Marland Kitchen FLUoxetine (PROZAC) 40 MG capsule TAKE 1 CAPSULE EVERY DAY (REPLACES TABLETS)  . furosemide (LASIX) 20 MG tablet TAKE 1/2 TABLET  DAILY  . hydrOXYzine (ATARAX/VISTARIL) 10 MG tablet Take 0.5-1 tablets (5-10 mg total) by mouth 2 (two) times daily as needed for anxiety.  Marland Kitchen levothyroxine (SYNTHROID) 88 MCG tablet TAKE 1 TABLET EVERY DAY BEFORE BREAKFAST  . lisinopril (ZESTRIL) 40 MG tablet Take 1 tablet (40 mg total) by mouth daily.  Marland Kitchen loratadine (CLARITIN) 10 MG tablet Take 1 tablet (10 mg total) by mouth every evening.  . metoprolol succinate (TOPROL-XL) 25 MG 24 hr tablet Take 1 tablet (25 mg total) by mouth daily.  Marland Kitchen MYRBETRIQ 25 MG TB24 tablet TAKE 1 TABLET EVERY DAY  . omeprazole (PRILOSEC) 40 MG capsule TAKE 1 CAPSULE EVERY DAY  . pravastatin  (PRAVACHOL) 40 MG tablet TAKE 1 TABLET EVERY DAY  . verapamil (CALAN-SR) 240 MG CR tablet TAKE 1 TABLET(240 MG) BY MOUTH AT BEDTIME  . vitamin B-12 (CYANOCOBALAMIN) 500 MCG tablet Take 1 tablet (500 mcg total) by mouth once a week.     Allergies:   Patient has no known allergies.   Social History   Tobacco Use  . Smoking status: Former Smoker    Quit date: 12/30/1992    Years since quitting: 28.3  . Smokeless tobacco: Never Used  Vaping Use  . Vaping Use: Never used  Substance Use Topics  . Alcohol use: No  . Drug use: No     Family Hx: The patient's family history includes COPD in her sister; Cancer in her father; Cancer (age of onset: 43) in her sister; Cancer (age of onset: 75) in her sister; Cancer (age of onset: 8)  in her mother; Cancer (age of onset: 48) in her brother; Cancer (age of onset: 18) in her sister; Diabetes in her daughter; Hypertension in her daughter. There is no history of CAD or Stroke.  ROS:   Please see the history of present illness.     All other systems reviewed and are negative.   Prior CV studies:   The following studies were reviewed today:  Echo 09/10/2019 1. The left ventricle has normal systolic function with an ejection  fraction of 60-65%. The cavity size was normal. There is severe asymmetric  left ventricular hypertrophy of the basal septal wall. Left ventricular  diastolic Doppler parameters are  consistent with impaired relaxation.  2. LVOT turbulence seen in systole without mitral valve SAM.  3. The right ventricle has mildly reduced systolic function. The cavity  was small. There is no increase in right ventricular wall thickness. Right  ventricular systolic pressure is normal.  4. Left atrial size was mildly dilated.  5. Mild thickening of the mitral valve leaflet. Mild calcification of the  mitral valve leaflet. No evidence of mitral valve stenosis.  6. The aortic valve is tricuspid. Mild thickening of the aortic valve.   Mild calcification of the aortic valve. Aortic valve regurgitation is mild  by color flow Doppler.  7. The aorta is normal unless otherwise noted.  8. The aortic root, ascending aorta and aortic arch are normal in size  and structure.    Labs/Other Tests and Data Reviewed:    EKG:  An ECG dated 03/12/2020 was personally reviewed today and demonstrated:  Sinus rhythm without significant ST-T wave changes.  Recent Labs: 10/04/2020: ALT 8; Magnesium 2.0; TSH 2.77 04/11/2021: BUN 17; Creatinine, Ser 1.30; Hemoglobin 11.1; Platelets 230.0; Potassium 4.2; Sodium 141   Recent Lipid Panel Lab Results  Component Value Date/Time   CHOL 156 10/04/2020 09:53 AM   TRIG 120.0 10/04/2020 09:53 AM   HDL 61.40 10/04/2020 09:53 AM   CHOLHDL 3 10/04/2020 09:53 AM   LDLCALC 71 10/04/2020 09:53 AM    Wt Readings from Last 3 Encounters:  05/02/21 205 lb (93 kg)  04/11/21 220 lb 5 oz (99.9 kg)  11/01/20 230 lb (104.3 kg)     Risk Assessment/Calculations:      Objective:    Vital Signs:  BP (!) 143/68   Pulse 64   Ht 5\' 4"  (1.626 m)   Wt 205 lb (93 kg)   BMI 35.19 kg/m    VITAL SIGNS:  reviewed  ASSESSMENT & PLAN:    1. Leg edema: Continue on the low-dose diuretic.  Denies any worsening leg edema recently  2. Hypertension: Blood pressure stable  3. Hyperlipidemia: On pravastatin  4. Hypothyroidism: On levothyroxine        COVID-19 Education: The signs and symptoms of COVID-19 were discussed with the patient and how to seek care for testing (follow up with PCP or arrange E-visit).  The importance of social distancing was discussed today.  Time:   Today, I have spent 10 minutes with the patient with telehealth technology discussing the above problems.     Medication Adjustments/Labs and Tests Ordered: Current medicines are reviewed at length with the patient today.  Concerns regarding medicines are outlined above.   Tests Ordered: No orders of the defined types were  placed in this encounter.   Medication Changes: No orders of the defined types were placed in this encounter.   Follow Up:  In Person in 6 month(s)  Signed, Isaac Laud  [place], [place]  05/02/2021 11:27 AM    [place]

## 2021-05-02 NOTE — Telephone Encounter (Signed)
Called patient to discuss AVS instructions gave Hao Meng, PA-C recommendations and patient voiced understanding. AVS summary mailed to patient.    

## 2021-05-02 NOTE — Patient Instructions (Signed)
Medication Instructions:  Your physician recommends that you continue on your current medications as directed. Please refer to the Current Medication list given to you today.  *If you need a refill on your cardiac medications before your next appointment, please call your pharmacy*  Lab Work: NONE ordered at this time of appointment   If you have labs (blood work) drawn today and your tests are completely normal, you will receive your results only by: Marland Kitchen MyChart Message (if you have MyChart) OR . A paper copy in the mail If you have any lab test that is abnormal or we need to change your treatment, we will call you to review the results.  Testing/Procedures: NONE ordered at this time of appointment   Follow-Up: At [facility], you and your health needs are our priority.  As part of our continuing mission to provide you with exceptional heart care, we have created designated Provider Care Teams.  These Care Teams include your primary Cardiologist (physician) and Advanced Practice Providers (APPs -  Physician Assistants and Nurse Practitioners) who all work together to provide you with the care you need, when you need it.  Your next appointment:   6 month(s)  The format for your next appointment:   In Person  Provider:   You may see Quay Burow, MD or one of the following Advanced Practice Providers on your designated Care Team:    [place], PA-C  Coletta Memos, FNP   Other Instructions

## 2021-05-04 ENCOUNTER — Encounter: Payer: Self-pay | Admitting: Physician Assistant

## 2021-05-10 ENCOUNTER — Other Ambulatory Visit: Payer: Self-pay

## 2021-05-10 NOTE — Telephone Encounter (Signed)
[name], pt's daughter called requesting refill of hydroxyzine. Advised PCP is not in office today. She said that is fine.  Name of Medication: hydroxyzine Name of Pharmacy: [address]  Henrietta Dine or Written Date and Quantity: Aug 2021 Last Office Visit and Type: 04/11/21, HTN f/u Next Office Visit and Type: not scheduled Last Controlled Substance Agreement Date: 03/07/16 Last UDS:03/07/17

## 2021-05-14 MED ORDER — HYDROXYZINE HCL 10 MG PO TABS: 5.0000 mg | ORAL_TABLET | Freq: Two times a day (BID) | ORAL | 0 refills | Status: DC | PRN

## 2021-06-03 ENCOUNTER — Other Ambulatory Visit: Payer: Self-pay | Admitting: Cardiovascular Disease

## 2021-06-11 ENCOUNTER — Other Ambulatory Visit: Payer: Self-pay | Admitting: Family Medicine

## 2021-08-04 ENCOUNTER — Telehealth: Payer: Self-pay

## 2021-08-04 NOTE — Telephone Encounter (Signed)
Prolia VOB due

## 2021-08-08 NOTE — Telephone Encounter (Signed)
Prolia VOB initiated via [email or web address]  Last OV: 04/11/21 Next OV:  Last Prolia inj: 03/07/21 Next Prolia inj DUE: 09/08/21

## 2021-08-09 ENCOUNTER — Telehealth: Payer: Self-pay | Admitting: Family Medicine

## 2021-08-09 NOTE — Telephone Encounter (Signed)
Home Health verbal orders [place] number: [phone]  [id number] OT/PT/Skilled nursing/Social Work/Speech:  Reason:PT,Speech.skilled nursing  Frequency:  Please forward to [facility] pool or providers CMA

## 2021-08-09 NOTE — Telephone Encounter (Signed)
Deidre is calling back also requesting latest OV notes and current med list be faxed to her attn at [phone].   Faxed requested info.

## 2021-08-10 NOTE — Telephone Encounter (Signed)
Agree with verbal orders  

## 2021-08-10 NOTE — Telephone Encounter (Signed)
Left message (on secured vm) for Deidre informing her Dr. Darnell Level is giving verbal orders for services requested for pt.

## 2021-08-13 ENCOUNTER — Telehealth: Payer: Self-pay | Admitting: Family Medicine

## 2021-08-13 NOTE — Telephone Encounter (Signed)
Deidra called in from [facility] and stated that when he sees Mrs. [name] to discuss her high bp and limited endurance in order for her to get PT as well as her cognitive decline for speech therapy

## 2021-08-14 NOTE — Telephone Encounter (Signed)
Noted. Requesting PT and ST for home health - will discuss on Friday.

## 2021-08-15 ENCOUNTER — Other Ambulatory Visit: Payer: Self-pay | Admitting: Family Medicine

## 2021-08-16 NOTE — Telephone Encounter (Signed)
PA initiated via [email or web address]  Lourdes Sledge (Key: [id number])  Your information has been submitted to [facility]. [employer] will review the request and will issue a decision, typically within 3-7 days from your submission. You can check the updated outcome later by reopening this request.  If [employer] has not responded in 3-7 days or if you have any questions about your ePA request, please contact [employer] at [phone]. If you think there may be a problem with your PA request, use our live chat feature at the bottom right.  For [place] requests, please call [phone].

## 2021-08-17 ENCOUNTER — Encounter: Payer: Self-pay | Admitting: Family Medicine

## 2021-08-17 ENCOUNTER — Other Ambulatory Visit: Payer: Self-pay

## 2021-08-17 ENCOUNTER — Ambulatory Visit (INDEPENDENT_AMBULATORY_CARE_PROVIDER_SITE_OTHER): Payer: Medicare HMO | Admitting: Family Medicine

## 2021-08-17 VITALS — BP 170/86 | HR 64 | Temp 97.9°F | Ht 64.0 in | Wt 224.2 lb

## 2021-08-17 DIAGNOSIS — I10 Essential (primary) hypertension: Secondary | ICD-10-CM | POA: Diagnosis not present

## 2021-08-17 DIAGNOSIS — R531 Weakness: Secondary | ICD-10-CM | POA: Diagnosis not present

## 2021-08-17 DIAGNOSIS — I5032 Chronic diastolic (congestive) heart failure: Secondary | ICD-10-CM | POA: Diagnosis not present

## 2021-08-17 DIAGNOSIS — N1832 Chronic kidney disease, stage 3b: Secondary | ICD-10-CM | POA: Diagnosis not present

## 2021-08-17 DIAGNOSIS — D500 Iron deficiency anemia secondary to blood loss (chronic): Secondary | ICD-10-CM

## 2021-08-17 DIAGNOSIS — R0600 Dyspnea, unspecified: Secondary | ICD-10-CM | POA: Diagnosis not present

## 2021-08-17 DIAGNOSIS — N3941 Urge incontinence: Secondary | ICD-10-CM

## 2021-08-17 DIAGNOSIS — R0609 Other forms of dyspnea: Secondary | ICD-10-CM

## 2021-08-17 LAB — BRAIN NATRIURETIC PEPTIDE: Pro B Natriuretic peptide (BNP): 260 pg/mL — ABNORMAL HIGH (ref 0.0–100.0)

## 2021-08-17 LAB — RENAL FUNCTION PANEL
Albumin: 3.8 g/dL (ref 3.5–5.2)
BUN: 24 mg/dL — ABNORMAL HIGH (ref 6–23)
CO2: 29 mEq/L (ref 19–32)
Calcium: 10.1 mg/dL (ref 8.4–10.5)
Chloride: 107 mEq/L (ref 96–112)
Creatinine, Ser: 1.39 mg/dL — ABNORMAL HIGH (ref 0.40–1.20)
GFR: 33.72 mL/min — ABNORMAL LOW (ref >60.00)
Glucose, Bld: 93 mg/dL (ref 70–99)
Phosphorus: 3 mg/dL (ref 2.3–4.6)
Potassium: 4.4 mEq/L (ref 3.5–5.1)
Sodium: 141 mEq/L (ref 135–145)

## 2021-08-17 LAB — CBC WITH DIFFERENTIAL/PLATELET
Basophils Absolute: 0.1 10*3/uL (ref 0.0–0.1)
Basophils Relative: 1.2 % (ref 0.0–3.0)
Eosinophils Absolute: 0.1 10*3/uL (ref 0.0–0.7)
Eosinophils Relative: 2 % (ref 0.0–5.0)
HCT: 34.1 % — ABNORMAL LOW (ref 36.0–46.0)
Hemoglobin: 11 g/dL — ABNORMAL LOW (ref 12.0–15.0)
Lymphocytes Relative: 17.5 % (ref 12.0–46.0)
Lymphs Abs: 0.9 10*3/uL (ref 0.7–4.0)
MCHC: 32.3 g/dL (ref 30.0–36.0)
MCV: 88.6 fl (ref 78.0–100.0)
Monocytes Absolute: 0.5 10*3/uL (ref 0.1–1.0)
Monocytes Relative: 9.8 % (ref 3.0–12.0)
Neutro Abs: 3.4 10*3/uL (ref 1.4–7.7)
Neutrophils Relative %: 69.5 % (ref 43.0–77.0)
Platelets: 184 10*3/uL (ref 150.0–400.0)
RBC: 3.85 Mil/uL — ABNORMAL LOW (ref 3.87–5.11)
RDW: 14.2 % (ref 11.5–15.5)
WBC: 4.9 10*3/uL (ref 4.0–10.5)

## 2021-08-17 LAB — FERRITIN: Ferritin: 81.3 ng/mL (ref 10.0–291.0)

## 2021-08-17 MED ORDER — VERAPAMIL HCL ER 180 MG PO TBCR: 360.0000 mg | EXTENDED_RELEASE_TABLET | Freq: Every day | ORAL | 1 refills | Status: DC

## 2021-08-17 NOTE — Assessment & Plan Note (Signed)
Seems overall euvolemic - conitnue lasix 10mg  daily. Dose limited by urinary urgency.

## 2021-08-17 NOTE — Telephone Encounter (Signed)
[name] called in from [facility] for updated verbal orders. Its to start Monday if approved for home health nurse, PT, AND ST. AND TO START ON Monday 22ND

## 2021-08-17 NOTE — Progress Notes (Signed)
Patient ID: CATE ORAVEC, female    DOB: 10/14/1930, 87 y.o.   MRN: [id number]  This visit was conducted in person.  BP (!) 170/86   Pulse 64   Temp 97.9 F (36.6 C) (Temporal)   Ht 5\' 4"  (1.626 m)   Wt 224 lb 3 oz (101.7 kg)   SpO2 97%   BMI 38.48 kg/m    170s/70s on retesting  CC: elevated BP readings, discuss cognitive issues Subjective:   HPI: NOHELI MELDER is a 87 y.o. female presenting on 08/17/2021 for Hypertension (C/o recent elevated BP readings.  [place] nurse suggested pt see PCP.  Also, need to discuss pt's limited endurance for pt to get PT and cognitive decline for pt to get ST.  Pt accompanied by daughter, [name]- temp 98.2.  )   See recent phone note [facility] Lemm[facility] requesting SN/PT/ST referrals.  She's been feeling more tired recently.  Having trouble sleeping in bed due to progressive orthopnea - sleeps in recliner. Uses 2 pillows when supine. Notes ongoing dyspnea. Notes ongoing leg swelling.  Denies chest pain/tightness.  She denies confusion, memory trouble, dysphagia, ST. Daughter hasn't noticed significant cognitive change either.   BP elevation noted recently at home despite lasix 10mg  daily, lisinopril 40mg  daily, toprol XL 25mg  daily and verapamil CR 240mg  daily. May not be as regular with lasix use due to urinary urgency incontinence - she wears depends.   IDA saw GI s/p EGD and colonoscopy - single bleeding colonic angiectasia s/p APC. Also found erosive gastropathy with stigmata of bleeding. She received IV iron 2021 as well as 1u blood transfusion. Plan PCP continue to monitor this, refer back to GI if needed. Continues pepcid and omeprazole daily. No longer on iron replacement.      Relevant past medical, surgical, family and social history reviewed and updated as indicated. Interim medical history since our last visit reviewed. Allergies and medications reviewed and updated. Outpatient Medications Prior to Visit  Medication Sig Dispense Refill  .  acetaminophen (TYLENOL) 500 MG tablet Take 1,000 mg by mouth in the morning and at bedtime.     . calcium gluconate 500 MG tablet Take 1 tablet (500 mg total) by mouth daily.    . cholecalciferol (VITAMIN D) 1000 UNITS tablet Take 1,000 Units by mouth daily.    Marland Kitchen denosumab (PROLIA) 60 MG/ML SOSY injection Inject 60 mg into the skin every 6 (six) months.    . diphenhydramine-acetaminophen (TYLENOL PM) 25-500 MG TABS Take 2 tablets by mouth at bedtime as needed (sleep).     Mariane Baumgarten Sodium (STOOL SOFTENER LAXATIVE PO) Take 1 Dose by mouth in the morning and at bedtime.    . famotidine (PEPCID) 20 MG tablet Take 20 mg by mouth daily.    Marland Kitchen FLUoxetine (PROZAC) 40 MG capsule TAKE 1 CAPSULE EVERY DAY  (REPLACES  TABLETS) 90 capsule 0  . furosemide (LASIX) 20 MG tablet TAKE 1/2 TABLET  DAILY 45 tablet 1  . hydrOXYzine (ATARAX/VISTARIL) 10 MG tablet Take 0.5-1 tablets (5-10 mg total) by mouth 2 (two) times daily as needed for anxiety. 30 tablet 0  . levothyroxine (SYNTHROID) 88 MCG tablet TAKE 1 TABLET EVERY DAY BEFORE BREAKFAST 90 tablet 0  . lisinopril (ZESTRIL) 40 MG tablet Take 1 tablet (40 mg total) by mouth daily. 90 tablet 1  . loratadine (CLARITIN) 10 MG tablet TAKE 1 TABLET EVERY EVENING 90 tablet 1  . metoprolol succinate (TOPROL-XL) 25 MG 24 hr tablet Take  1 tablet (25 mg total) by mouth daily. 30 tablet 6  . mirabegron ER (MYRBETRIQ) 25 MG TB24 tablet TAKE 1 TABLET EVERY DAY 90 tablet 0  . omeprazole (PRILOSEC) 40 MG capsule TAKE 1 CAPSULE EVERY DAY 90 capsule 0  . pravastatin (PRAVACHOL) 40 MG tablet TAKE 1 TABLET EVERY DAY 90 tablet 3  . vitamin B-12 (CYANOCOBALAMIN) 500 MCG tablet Take 1 tablet (500 mcg total) by mouth once a week.    . verapamil (CALAN-SR) 240 MG CR tablet TAKE 1 TABLET EVERY DAY 90 tablet 3   No facility-administered medications prior to visit.     Per HPI unless specifically indicated in ROS section below Review of Systems  Objective:  BP (!) 170/86   Pulse 64    Temp 97.9 F (36.6 C) (Temporal)   Ht 5\' 4"  (1.626 m)   Wt 224 lb 3 oz (101.7 kg)   SpO2 97%   BMI 38.48 kg/m   Wt Readings from Last 3 Encounters:  08/17/21 224 lb 3 oz (101.7 kg)  05/02/21 205 lb (93 kg)  04/11/21 220 lb 5 oz (99.9 kg)      Physical Exam Vitals and nursing note reviewed.  Constitutional:      Appearance: Normal appearance. She is not ill-appearing.  Cardiovascular:     Rate and Rhythm: Normal rate and regular rhythm.     Pulses: Normal pulses.     Heart sounds: Normal heart sounds. No murmur heard. Pulmonary:     Effort: Pulmonary effort is normal. No respiratory distress.     Breath sounds: Normal breath sounds. No wheezing, rhonchi or rales.  Musculoskeletal:     Right lower leg: No edema.     Left lower leg: No edema.  Skin:    General: Skin is warm and dry.     Findings: No rash.  Neurological:     Mental Status: She is alert.  Psychiatric:        Mood and Affect: Mood normal.        Behavior: Behavior normal.      Results for orders placed or performed in visit on 04/11/21  Renal function panel  Result Value Ref Range   Sodium 141 135 - 145 mEq/L   Potassium 4.2 3.5 - 5.1 mEq/L   Chloride 112 96 - 112 mEq/L   CO2 22 19 - 32 mEq/L   Albumin 3.5 3.5 - 5.2 g/dL   BUN 17 6 - 23 mg/dL   Creatinine, Ser 1.30 (H) 0.40 - 1.20 mg/dL   Glucose, Bld 82 70 - 99 mg/dL   Phosphorus 2.2 (L) 2.3 - 4.6 mg/dL   GFR 36.63 (L) >60.00 mL/min   Calcium 8.3 (L) 8.4 - 10.5 mg/dL  CBC with Differential/Platelet  Result Value Ref Range   WBC 4.1 4.0 - 10.5 K/uL   RBC 3.91 3.87 - 5.11 Mil/uL   Hemoglobin 11.1 (L) 12.0 - 15.0 g/dL   HCT 33.9 (L) 36.0 - 46.0 %   MCV 86.8 78.0 - 100.0 fl   MCHC 32.6 30.0 - 36.0 g/dL   RDW 14.6 11.5 - 15.5 %   Platelets 230.0 150.0 - 400.0 K/uL   Neutrophils Relative % 63.6 43.0 - 77.0 %   Lymphocytes Relative 20.8 12.0 - 46.0 %   Monocytes Relative 12.1 (H) 3.0 - 12.0 %   Eosinophils Relative 1.9 0.0 - 5.0 %   Basophils  Relative 1.6 0.0 - 3.0 %   Neutro Abs 2.6 1.4 - 7.7 K/uL  Lymphs Abs 0.8 0.7 - 4.0 K/uL   Monocytes Absolute 0.5 0.1 - 1.0 K/uL   Eosinophils Absolute 0.1 0.0 - 0.7 K/uL   Basophils Absolute 0.1 0.0 - 0.1 K/uL  Ferritin  Result Value Ref Range   Ferritin 124.6 10.0 - 291.0 ng/mL  IBC panel  Result Value Ref Range   Iron 48 42 - 145 ug/dL   Transferrin 165.0 (L) 212.0 - 360.0 mg/dL   Saturation Ratios 20.8 20.0 - 50.0 %    Assessment & Plan:  This visit occurred during the SARS-CoV-2 public health emergency.  Safety protocols were in place, including screening questions prior to the visit, additional usage of staff PPE, and extensive cleaning of exam room while observing appropriate contact time as indicated for disinfecting solutions.   Problem List Items Addressed This Visit     Chronic diastolic CHF (congestive heart failure) (HCC) (Chronic)    Seems overall euvolemic - conitnue lasix 10mg  daily. Dose limited by urinary urgency.       Relevant Medications   verapamil (CALAN-SR) 180 MG CR tablet   Other Relevant Orders   Brain natriuretic peptide   Ambulatory referral to Home Health   Hypertension - Primary    BP remains markedly elevated despite complaince with 4 drug regimen. BB dose limited by heart rate. Will cautiously increase verapamil to 360mg  daily. If intolerable or ineffective, consider adding spironolactone vs hydralazine.      Relevant Medications   verapamil (CALAN-SR) 180 MG CR tablet   Other Relevant Orders   Ambulatory referral to Home Health   Exertional dyspnea    Progressive, associated with increasing fatigue. Anticipate related to HFpEF. Update CBC and BNP.  Will also refer to [facility] for SN and PT.       Relevant Orders   Brain natriuretic peptide   Ambulatory referral to Home Health   CKD (chronic kidney disease) stage 3, GFR 30-59 ml/min (HCC)    Update renal panel on lasix 10mg  daily and lisinopril 40mg  daily, not on potassium at this time.        Relevant Orders   Renal function panel   Urge incontinence    Ongoing. Continues myrbetriq.       Iron deficiency anemia    Update CBC, ferritin.  S/p iron infusion x2 03/2020 She continues omeprazole and pepcid but is off oral iron.      Relevant Orders   CBC with Differential/Platelet   Ferritin   General weakness    Worsening, would benefit from HHPT for conditioning training. Will refer.       Relevant Orders   Ambulatory referral to [place] ordered this encounter  Medications  . verapamil (CALAN-SR) 180 MG CR tablet    Sig: Take 2 tablets (360 mg total) by mouth daily.    Dispense:  180 tablet    Refill:  1    Note increased verapamil dose   Orders Placed This Encounter  Procedures  . CBC with Differential/Platelet  . Renal function panel  . Ferritin  . Brain natriuretic peptide  . Ambulatory referral to Home Health    Referral Priority:   Routine    Referral Type:   Home Health Care    Referral Reason:   Specialty Services Required    Requested Specialty:   [place]    Number of Visits Requested:   1    Patient Instructions  Labs today Increase verapamil CR to 180mg  2 tablets  daily (for total of 360mg  dose).  Let us know how blood pressures run on higher verapamil or if any trouble tolerating medicine.  Return in 4-6 weeks for follow up visit   Follow up plan: Return in about 4 weeks (around 09/14/2021) for follow up visit.  [name], MD

## 2021-08-17 NOTE — Assessment & Plan Note (Signed)
Update renal panel on lasix 10mg  daily and lisinopril 40mg  daily, not on potassium at this time.

## 2021-08-17 NOTE — Telephone Encounter (Addendum)
Recommend start with PT and SN.  No noted cognitive decline - don't see need for ST at this time.

## 2021-08-17 NOTE — Assessment & Plan Note (Signed)
BP remains markedly elevated despite complaince with 4 drug regimen. BB dose limited by heart rate. Will cautiously increase verapamil to 360mg  daily. If intolerable or ineffective, consider adding spironolactone vs hydralazine.

## 2021-08-17 NOTE — Assessment & Plan Note (Signed)
Worsening, would benefit from HHPT for conditioning training. Will refer.

## 2021-08-17 NOTE — Assessment & Plan Note (Addendum)
Progressive, associated with increasing fatigue. Anticipate related to HFpEF. Update CBC and BNP.  Will also refer to [facility] for SN and PT.

## 2021-08-17 NOTE — Telephone Encounter (Addendum)
Spoke with [name] relaying Dr. Synthia Innocent message.  She verbalizes understanding and requests today's OV note be faxed to her at [phone].  Faxed notes.

## 2021-08-17 NOTE — Assessment & Plan Note (Addendum)
Update CBC, ferritin.  S/p iron infusion x2 03/2020 She continues omeprazole and pepcid but is off oral iron.

## 2021-08-17 NOTE — Patient Instructions (Addendum)
Labs today Increase verapamil CR to 180mg  2 tablets daily (for total of 360mg  dose).  Let us know how blood pressures run on higher verapamil or if any trouble tolerating medicine.  Return in 4-6 weeks for follow up visit

## 2021-08-17 NOTE — Assessment & Plan Note (Signed)
Ongoing. Continues myrbetriq.

## 2021-08-18 NOTE — Telephone Encounter (Signed)
Pt ready for scheduling on or after 09/08/21  Out-of-pocket cost due at time of visit: $280  Primary: [employer] Prolia co-insurance: 20% ($255) Admin fee co-insurance: 20% ($25)  Secondary: n/a Prolia co-insurance:  Admin fee co-insurance:   Deductible: does not apply  Prior Auth: APPROVED PA# [id number] Valid: 02/21/21-12/29/21

## 2021-08-20 NOTE — Telephone Encounter (Signed)
[facility] called back to delay services until 8/23

## 2021-08-21 DIAGNOSIS — N3941 Urge incontinence: Secondary | ICD-10-CM | POA: Diagnosis not present

## 2021-08-21 DIAGNOSIS — D509 Iron deficiency anemia, unspecified: Secondary | ICD-10-CM | POA: Diagnosis not present

## 2021-08-21 DIAGNOSIS — N1832 Chronic kidney disease, stage 3b: Secondary | ICD-10-CM | POA: Diagnosis not present

## 2021-08-21 DIAGNOSIS — K5520 Angiodysplasia of colon without hemorrhage: Secondary | ICD-10-CM | POA: Diagnosis not present

## 2021-08-21 DIAGNOSIS — I130 Hypertensive heart and chronic kidney disease with heart failure and stage 1 through stage 4 chronic kidney disease, or unspecified chronic kidney disease: Secondary | ICD-10-CM | POA: Diagnosis not present

## 2021-08-21 DIAGNOSIS — I5032 Chronic diastolic (congestive) heart failure: Secondary | ICD-10-CM | POA: Diagnosis not present

## 2021-08-21 DIAGNOSIS — M810 Age-related osteoporosis without current pathological fracture: Secondary | ICD-10-CM | POA: Diagnosis not present

## 2021-08-21 DIAGNOSIS — Z96659 Presence of unspecified artificial knee joint: Secondary | ICD-10-CM | POA: Diagnosis not present

## 2021-08-21 DIAGNOSIS — H18519 Endothelial corneal dystrophy, unspecified eye: Secondary | ICD-10-CM | POA: Diagnosis not present

## 2021-08-21 NOTE — Telephone Encounter (Signed)
Called [name] and she was given verbal orders per Dr. Synthia Innocent recommendations.

## 2021-08-22 ENCOUNTER — Other Ambulatory Visit: Payer: Self-pay | Admitting: Family Medicine

## 2021-08-22 DIAGNOSIS — D509 Iron deficiency anemia, unspecified: Secondary | ICD-10-CM | POA: Diagnosis not present

## 2021-08-22 DIAGNOSIS — N1832 Chronic kidney disease, stage 3b: Secondary | ICD-10-CM | POA: Diagnosis not present

## 2021-08-22 DIAGNOSIS — M810 Age-related osteoporosis without current pathological fracture: Secondary | ICD-10-CM | POA: Diagnosis not present

## 2021-08-22 DIAGNOSIS — Z96659 Presence of unspecified artificial knee joint: Secondary | ICD-10-CM | POA: Diagnosis not present

## 2021-08-22 DIAGNOSIS — I130 Hypertensive heart and chronic kidney disease with heart failure and stage 1 through stage 4 chronic kidney disease, or unspecified chronic kidney disease: Secondary | ICD-10-CM | POA: Diagnosis not present

## 2021-08-22 DIAGNOSIS — N3941 Urge incontinence: Secondary | ICD-10-CM | POA: Diagnosis not present

## 2021-08-22 DIAGNOSIS — K5520 Angiodysplasia of colon without hemorrhage: Secondary | ICD-10-CM | POA: Diagnosis not present

## 2021-08-22 DIAGNOSIS — H18519 Endothelial corneal dystrophy, unspecified eye: Secondary | ICD-10-CM | POA: Diagnosis not present

## 2021-08-22 DIAGNOSIS — I5032 Chronic diastolic (congestive) heart failure: Secondary | ICD-10-CM | POA: Diagnosis not present

## 2021-08-22 NOTE — Telephone Encounter (Signed)
Kidney function has been worsening.  Will need to recheck kidneys and BP prior to deciding on prolia.  I am waiting on update for effect of increased lasix to bid for 5 days from last week.

## 2021-08-22 NOTE — Telephone Encounter (Signed)
Spoke with [name],-ok per DPR on file. Will plan on getting Prolia on 09/14/21.  [name] did want to make sure that patient would be ok with proceeding with this with her having high b/p issues. Dr Darnell Level please advise

## 2021-08-22 NOTE — Telephone Encounter (Signed)
[name] advised. Hold on Prolia for now.

## 2021-08-22 NOTE — Telephone Encounter (Signed)
OOP cost $295. PA dates: 02/21/21-12/29/21 Lab visit was done on 08/17/21 Calcium was normal 10.1 CrCl 44.05 mL/min. Ok to proceed with injection.  Patient has an appointment with Dr Darnell Level on 09/14/21 and Prolia can be given at that time if patient is ok with this. I called both numbers in the chart to discuss and advise of possible cost for Prolia, left message to have patient call me back

## 2021-08-23 DIAGNOSIS — I5032 Chronic diastolic (congestive) heart failure: Secondary | ICD-10-CM | POA: Diagnosis not present

## 2021-08-23 DIAGNOSIS — K5520 Angiodysplasia of colon without hemorrhage: Secondary | ICD-10-CM | POA: Diagnosis not present

## 2021-08-23 DIAGNOSIS — N3941 Urge incontinence: Secondary | ICD-10-CM | POA: Diagnosis not present

## 2021-08-23 DIAGNOSIS — N1832 Chronic kidney disease, stage 3b: Secondary | ICD-10-CM | POA: Diagnosis not present

## 2021-08-23 DIAGNOSIS — D509 Iron deficiency anemia, unspecified: Secondary | ICD-10-CM | POA: Diagnosis not present

## 2021-08-23 DIAGNOSIS — M810 Age-related osteoporosis without current pathological fracture: Secondary | ICD-10-CM | POA: Diagnosis not present

## 2021-08-23 DIAGNOSIS — Z96659 Presence of unspecified artificial knee joint: Secondary | ICD-10-CM | POA: Diagnosis not present

## 2021-08-23 DIAGNOSIS — H18519 Endothelial corneal dystrophy, unspecified eye: Secondary | ICD-10-CM | POA: Diagnosis not present

## 2021-08-23 DIAGNOSIS — I130 Hypertensive heart and chronic kidney disease with heart failure and stage 1 through stage 4 chronic kidney disease, or unspecified chronic kidney disease: Secondary | ICD-10-CM | POA: Diagnosis not present

## 2021-08-27 ENCOUNTER — Telehealth: Payer: Self-pay

## 2021-08-27 ENCOUNTER — Telehealth: Payer: Self-pay | Admitting: Family Medicine

## 2021-08-27 DIAGNOSIS — I130 Hypertensive heart and chronic kidney disease with heart failure and stage 1 through stage 4 chronic kidney disease, or unspecified chronic kidney disease: Secondary | ICD-10-CM | POA: Diagnosis not present

## 2021-08-27 DIAGNOSIS — Z96659 Presence of unspecified artificial knee joint: Secondary | ICD-10-CM | POA: Diagnosis not present

## 2021-08-27 DIAGNOSIS — M810 Age-related osteoporosis without current pathological fracture: Secondary | ICD-10-CM | POA: Diagnosis not present

## 2021-08-27 DIAGNOSIS — N1832 Chronic kidney disease, stage 3b: Secondary | ICD-10-CM | POA: Diagnosis not present

## 2021-08-27 DIAGNOSIS — D509 Iron deficiency anemia, unspecified: Secondary | ICD-10-CM | POA: Diagnosis not present

## 2021-08-27 DIAGNOSIS — K5520 Angiodysplasia of colon without hemorrhage: Secondary | ICD-10-CM | POA: Diagnosis not present

## 2021-08-27 DIAGNOSIS — N3941 Urge incontinence: Secondary | ICD-10-CM | POA: Diagnosis not present

## 2021-08-27 DIAGNOSIS — H18519 Endothelial corneal dystrophy, unspecified eye: Secondary | ICD-10-CM | POA: Diagnosis not present

## 2021-08-27 DIAGNOSIS — I5032 Chronic diastolic (congestive) heart failure: Secondary | ICD-10-CM | POA: Diagnosis not present

## 2021-08-27 NOTE — Telephone Encounter (Signed)
Request for Speech therapy  2 times a week for two weeks  1 time a week for 1 week   Please call Sonal at [phone] and give orders.

## 2021-08-27 NOTE — Telephone Encounter (Signed)
Received HH note that BP remains elevated at 165/70 at home.  Plz call pt for update on blood pressures since increasing verapamil to 360mg  daily - if BP persistently high at home, recommend adding 5th BP med - let us know if interested in starting hydralazine.

## 2021-08-27 NOTE — Telephone Encounter (Signed)
Pt daughter called back to give BP readings and weights   8/25  212lb  164/78  8/26  212 lb  129/98 pt daughter stated that it may have been 129/78 8/27  210 lb  140/80  8/28  209lb  136/70  8/29  211lb  187/68  Pt daughter wanted to know what machine would be the best to take her mothers BP

## 2021-08-27 NOTE — Telephone Encounter (Signed)
I assume this is continuation of prior phone note. Ok to do.

## 2021-08-27 NOTE — Telephone Encounter (Signed)
Ok to do. Thanks.  

## 2021-08-27 NOTE — Telephone Encounter (Signed)
Spoke with pt asking for BP update.  Pt states her BP is down but did not have readings handy.  Says she will get them and call back so Dr. Darnell Level will know what they are.

## 2021-08-27 NOTE — Telephone Encounter (Signed)
Received Call from [facility] with [facility]. Needs verbal orders for start of care for skilled nursing. Would like call back with orders

## 2021-08-28 ENCOUNTER — Telehealth: Payer: Self-pay

## 2021-08-28 DIAGNOSIS — I130 Hypertensive heart and chronic kidney disease with heart failure and stage 1 through stage 4 chronic kidney disease, or unspecified chronic kidney disease: Secondary | ICD-10-CM | POA: Diagnosis not present

## 2021-08-28 DIAGNOSIS — M810 Age-related osteoporosis without current pathological fracture: Secondary | ICD-10-CM | POA: Diagnosis not present

## 2021-08-28 DIAGNOSIS — I5032 Chronic diastolic (congestive) heart failure: Secondary | ICD-10-CM | POA: Diagnosis not present

## 2021-08-28 DIAGNOSIS — N1832 Chronic kidney disease, stage 3b: Secondary | ICD-10-CM | POA: Diagnosis not present

## 2021-08-28 DIAGNOSIS — K5520 Angiodysplasia of colon without hemorrhage: Secondary | ICD-10-CM | POA: Diagnosis not present

## 2021-08-28 DIAGNOSIS — N3941 Urge incontinence: Secondary | ICD-10-CM | POA: Diagnosis not present

## 2021-08-28 DIAGNOSIS — H18519 Endothelial corneal dystrophy, unspecified eye: Secondary | ICD-10-CM | POA: Diagnosis not present

## 2021-08-28 DIAGNOSIS — Z96659 Presence of unspecified artificial knee joint: Secondary | ICD-10-CM | POA: Diagnosis not present

## 2021-08-28 DIAGNOSIS — D509 Iron deficiency anemia, unspecified: Secondary | ICD-10-CM | POA: Diagnosis not present

## 2021-08-28 MED ORDER — FUROSEMIDE 20 MG PO TABS: 10.0000 mg | ORAL_TABLET | Freq: Two times a day (BID) | ORAL | 1 refills | Status: DC

## 2021-08-28 NOTE — Addendum Note (Signed)
Addended by: [name] on: 08/28/2021 08:02 AM   Modules accepted: Orders

## 2021-08-28 NOTE — Telephone Encounter (Signed)
Lvm (on secured vm) for Pam informing her Dr. Darnell Level is giving verbal orders for services requested.

## 2021-08-28 NOTE — Telephone Encounter (Signed)
[name] (DPR signed) left v/m wanting to know which verapamil pt should be taking the 240 mg taking one daily or the 180 mg taking two tabs daily. Per 08/17/21 note verapamil was increased to 360 mg. [name] voiced understanding. Pts nurse took BP earlier today and told [name] it was good but [name] could not remember the actual BP and did not write it down. Sending note to DR G to review.

## 2021-08-28 NOTE — Telephone Encounter (Signed)
Agree - should be on verapamil 180mg  2 tab daily.

## 2021-08-28 NOTE — Telephone Encounter (Addendum)
BP with significant fluctuations but overall may be improving - and weight drop noted.  Would recommend continue lasix 10mg  bid if tolerated ok. Take first thing in the morning and again early afternoon.  Recommend automatic BP monitor using arm cuff, not wrist cuff.

## 2021-08-28 NOTE — Telephone Encounter (Signed)
Spoke with Sonal informing her Dr. Darnell Level is giving verbal orders for services requested for pt.

## 2021-08-29 DIAGNOSIS — K5520 Angiodysplasia of colon without hemorrhage: Secondary | ICD-10-CM | POA: Diagnosis not present

## 2021-08-29 DIAGNOSIS — N3941 Urge incontinence: Secondary | ICD-10-CM | POA: Diagnosis not present

## 2021-08-29 DIAGNOSIS — Z9181 History of falling: Secondary | ICD-10-CM

## 2021-08-29 DIAGNOSIS — I130 Hypertensive heart and chronic kidney disease with heart failure and stage 1 through stage 4 chronic kidney disease, or unspecified chronic kidney disease: Secondary | ICD-10-CM | POA: Diagnosis not present

## 2021-08-29 DIAGNOSIS — H18519 Endothelial corneal dystrophy, unspecified eye: Secondary | ICD-10-CM | POA: Diagnosis not present

## 2021-08-29 DIAGNOSIS — N1832 Chronic kidney disease, stage 3b: Secondary | ICD-10-CM | POA: Diagnosis not present

## 2021-08-29 DIAGNOSIS — I5032 Chronic diastolic (congestive) heart failure: Secondary | ICD-10-CM | POA: Diagnosis not present

## 2021-08-29 DIAGNOSIS — M810 Age-related osteoporosis without current pathological fracture: Secondary | ICD-10-CM | POA: Diagnosis not present

## 2021-08-29 DIAGNOSIS — D509 Iron deficiency anemia, unspecified: Secondary | ICD-10-CM | POA: Diagnosis not present

## 2021-08-29 DIAGNOSIS — Z96659 Presence of unspecified artificial knee joint: Secondary | ICD-10-CM | POA: Diagnosis not present

## 2021-08-29 NOTE — Telephone Encounter (Signed)
Patient's daughter notified as instructed by telephone and verbalized understanding. 

## 2021-08-30 DIAGNOSIS — M810 Age-related osteoporosis without current pathological fracture: Secondary | ICD-10-CM | POA: Diagnosis not present

## 2021-08-30 DIAGNOSIS — K5520 Angiodysplasia of colon without hemorrhage: Secondary | ICD-10-CM | POA: Diagnosis not present

## 2021-08-30 DIAGNOSIS — I130 Hypertensive heart and chronic kidney disease with heart failure and stage 1 through stage 4 chronic kidney disease, or unspecified chronic kidney disease: Secondary | ICD-10-CM | POA: Diagnosis not present

## 2021-08-30 DIAGNOSIS — D509 Iron deficiency anemia, unspecified: Secondary | ICD-10-CM | POA: Diagnosis not present

## 2021-08-30 DIAGNOSIS — N1832 Chronic kidney disease, stage 3b: Secondary | ICD-10-CM | POA: Diagnosis not present

## 2021-08-30 DIAGNOSIS — Z96659 Presence of unspecified artificial knee joint: Secondary | ICD-10-CM | POA: Diagnosis not present

## 2021-08-30 DIAGNOSIS — I5032 Chronic diastolic (congestive) heart failure: Secondary | ICD-10-CM | POA: Diagnosis not present

## 2021-08-30 DIAGNOSIS — H18519 Endothelial corneal dystrophy, unspecified eye: Secondary | ICD-10-CM | POA: Diagnosis not present

## 2021-08-30 DIAGNOSIS — N3941 Urge incontinence: Secondary | ICD-10-CM | POA: Diagnosis not present

## 2021-08-31 DIAGNOSIS — N3941 Urge incontinence: Secondary | ICD-10-CM | POA: Diagnosis not present

## 2021-08-31 DIAGNOSIS — I130 Hypertensive heart and chronic kidney disease with heart failure and stage 1 through stage 4 chronic kidney disease, or unspecified chronic kidney disease: Secondary | ICD-10-CM | POA: Diagnosis not present

## 2021-08-31 DIAGNOSIS — I5032 Chronic diastolic (congestive) heart failure: Secondary | ICD-10-CM | POA: Diagnosis not present

## 2021-08-31 DIAGNOSIS — Z96659 Presence of unspecified artificial knee joint: Secondary | ICD-10-CM | POA: Diagnosis not present

## 2021-08-31 DIAGNOSIS — D509 Iron deficiency anemia, unspecified: Secondary | ICD-10-CM | POA: Diagnosis not present

## 2021-08-31 DIAGNOSIS — M810 Age-related osteoporosis without current pathological fracture: Secondary | ICD-10-CM | POA: Diagnosis not present

## 2021-08-31 DIAGNOSIS — N1832 Chronic kidney disease, stage 3b: Secondary | ICD-10-CM | POA: Diagnosis not present

## 2021-08-31 DIAGNOSIS — H18519 Endothelial corneal dystrophy, unspecified eye: Secondary | ICD-10-CM | POA: Diagnosis not present

## 2021-08-31 DIAGNOSIS — K5520 Angiodysplasia of colon without hemorrhage: Secondary | ICD-10-CM | POA: Diagnosis not present

## 2021-09-04 DIAGNOSIS — N1832 Chronic kidney disease, stage 3b: Secondary | ICD-10-CM | POA: Diagnosis not present

## 2021-09-04 DIAGNOSIS — N3941 Urge incontinence: Secondary | ICD-10-CM | POA: Diagnosis not present

## 2021-09-04 DIAGNOSIS — D509 Iron deficiency anemia, unspecified: Secondary | ICD-10-CM | POA: Diagnosis not present

## 2021-09-04 DIAGNOSIS — K5520 Angiodysplasia of colon without hemorrhage: Secondary | ICD-10-CM | POA: Diagnosis not present

## 2021-09-04 DIAGNOSIS — Z96659 Presence of unspecified artificial knee joint: Secondary | ICD-10-CM | POA: Diagnosis not present

## 2021-09-04 DIAGNOSIS — I130 Hypertensive heart and chronic kidney disease with heart failure and stage 1 through stage 4 chronic kidney disease, or unspecified chronic kidney disease: Secondary | ICD-10-CM | POA: Diagnosis not present

## 2021-09-04 DIAGNOSIS — M810 Age-related osteoporosis without current pathological fracture: Secondary | ICD-10-CM | POA: Diagnosis not present

## 2021-09-04 DIAGNOSIS — H18519 Endothelial corneal dystrophy, unspecified eye: Secondary | ICD-10-CM | POA: Diagnosis not present

## 2021-09-04 DIAGNOSIS — I5032 Chronic diastolic (congestive) heart failure: Secondary | ICD-10-CM | POA: Diagnosis not present

## 2021-09-05 ENCOUNTER — Telehealth: Payer: Self-pay | Admitting: Family Medicine

## 2021-09-05 DIAGNOSIS — H18519 Endothelial corneal dystrophy, unspecified eye: Secondary | ICD-10-CM | POA: Diagnosis not present

## 2021-09-05 DIAGNOSIS — Z96659 Presence of unspecified artificial knee joint: Secondary | ICD-10-CM | POA: Diagnosis not present

## 2021-09-05 DIAGNOSIS — N1832 Chronic kidney disease, stage 3b: Secondary | ICD-10-CM | POA: Diagnosis not present

## 2021-09-05 DIAGNOSIS — N3941 Urge incontinence: Secondary | ICD-10-CM | POA: Diagnosis not present

## 2021-09-05 DIAGNOSIS — K5520 Angiodysplasia of colon without hemorrhage: Secondary | ICD-10-CM | POA: Diagnosis not present

## 2021-09-05 DIAGNOSIS — M810 Age-related osteoporosis without current pathological fracture: Secondary | ICD-10-CM | POA: Diagnosis not present

## 2021-09-05 DIAGNOSIS — I130 Hypertensive heart and chronic kidney disease with heart failure and stage 1 through stage 4 chronic kidney disease, or unspecified chronic kidney disease: Secondary | ICD-10-CM | POA: Diagnosis not present

## 2021-09-05 DIAGNOSIS — I5032 Chronic diastolic (congestive) heart failure: Secondary | ICD-10-CM | POA: Diagnosis not present

## 2021-09-05 DIAGNOSIS — D509 Iron deficiency anemia, unspecified: Secondary | ICD-10-CM | POA: Diagnosis not present

## 2021-09-05 NOTE — Telephone Encounter (Signed)
Nenita called in from [facility] and stated that Mrs. Fiorito BP is 153/68 and that is outside of their range and has to report it. And she has some edema in both legs.

## 2021-09-06 NOTE — Telephone Encounter (Signed)
Spoke with pt's daughter, [name] (on dpr), relaying Dr. Synthia Innocent message.  Verbalizes understanding.

## 2021-09-06 NOTE — Telephone Encounter (Signed)
Can we get some more weights and BP readings from daughter?  If BP staying high, leg swelling persists, may add another BP medicine in addition to current regimen.

## 2021-09-07 DIAGNOSIS — K5520 Angiodysplasia of colon without hemorrhage: Secondary | ICD-10-CM | POA: Diagnosis not present

## 2021-09-07 DIAGNOSIS — D509 Iron deficiency anemia, unspecified: Secondary | ICD-10-CM | POA: Diagnosis not present

## 2021-09-07 DIAGNOSIS — I130 Hypertensive heart and chronic kidney disease with heart failure and stage 1 through stage 4 chronic kidney disease, or unspecified chronic kidney disease: Secondary | ICD-10-CM | POA: Diagnosis not present

## 2021-09-07 DIAGNOSIS — N1832 Chronic kidney disease, stage 3b: Secondary | ICD-10-CM | POA: Diagnosis not present

## 2021-09-07 DIAGNOSIS — M810 Age-related osteoporosis without current pathological fracture: Secondary | ICD-10-CM | POA: Diagnosis not present

## 2021-09-07 DIAGNOSIS — Z96659 Presence of unspecified artificial knee joint: Secondary | ICD-10-CM | POA: Diagnosis not present

## 2021-09-07 DIAGNOSIS — N3941 Urge incontinence: Secondary | ICD-10-CM | POA: Diagnosis not present

## 2021-09-07 DIAGNOSIS — H18519 Endothelial corneal dystrophy, unspecified eye: Secondary | ICD-10-CM | POA: Diagnosis not present

## 2021-09-07 DIAGNOSIS — I5032 Chronic diastolic (congestive) heart failure: Secondary | ICD-10-CM | POA: Diagnosis not present

## 2021-09-11 ENCOUNTER — Telehealth: Payer: Self-pay | Admitting: Family Medicine

## 2021-09-11 DIAGNOSIS — I5032 Chronic diastolic (congestive) heart failure: Secondary | ICD-10-CM | POA: Diagnosis not present

## 2021-09-11 DIAGNOSIS — Z96659 Presence of unspecified artificial knee joint: Secondary | ICD-10-CM | POA: Diagnosis not present

## 2021-09-11 DIAGNOSIS — K5520 Angiodysplasia of colon without hemorrhage: Secondary | ICD-10-CM | POA: Diagnosis not present

## 2021-09-11 DIAGNOSIS — I130 Hypertensive heart and chronic kidney disease with heart failure and stage 1 through stage 4 chronic kidney disease, or unspecified chronic kidney disease: Secondary | ICD-10-CM | POA: Diagnosis not present

## 2021-09-11 DIAGNOSIS — D509 Iron deficiency anemia, unspecified: Secondary | ICD-10-CM | POA: Diagnosis not present

## 2021-09-11 DIAGNOSIS — N1832 Chronic kidney disease, stage 3b: Secondary | ICD-10-CM | POA: Diagnosis not present

## 2021-09-11 DIAGNOSIS — N3941 Urge incontinence: Secondary | ICD-10-CM | POA: Diagnosis not present

## 2021-09-11 DIAGNOSIS — H18519 Endothelial corneal dystrophy, unspecified eye: Secondary | ICD-10-CM | POA: Diagnosis not present

## 2021-09-11 DIAGNOSIS — M810 Age-related osteoporosis without current pathological fracture: Secondary | ICD-10-CM | POA: Diagnosis not present

## 2021-09-11 MED ORDER — HYDRALAZINE HCL 25 MG PO TABS
25.0000 mg | ORAL_TABLET | Freq: Two times a day (BID) | ORAL | 6 refills | Status: DC
Start: 2021-09-11 — End: 2022-04-26

## 2021-09-11 NOTE — Telephone Encounter (Signed)
Nenita with [facility] called stating that she saw the patient this morning and her blood pressure was 152/70 and after exercising it was 158/70. Nenita was given information on the new medication added for patient.   Called patients daughter [name] and gave her the information per Dr. Bosie Clos note regarding adding another blood pressure medication. [name] stated that she will pick it up today and start her mom on the new medication.

## 2021-09-11 NOTE — Telephone Encounter (Addendum)
Received second reading from [facility] - 170/80.  Recommend we go ahead and add on another BP med which I have sent to local [facility] pharmacy - hydralazine 25mg  bid.   Continue lasix 10mg  bid, lisinopril 40mg  daily, toprol XL 25mg  daily, verapamil 360mg  daily.

## 2021-09-11 NOTE — Addendum Note (Signed)
Addended by: [name] on: 09/11/2021 08:05 AM   Modules accepted: Orders

## 2021-09-12 DIAGNOSIS — N1832 Chronic kidney disease, stage 3b: Secondary | ICD-10-CM | POA: Diagnosis not present

## 2021-09-12 DIAGNOSIS — Z96659 Presence of unspecified artificial knee joint: Secondary | ICD-10-CM | POA: Diagnosis not present

## 2021-09-12 DIAGNOSIS — N3941 Urge incontinence: Secondary | ICD-10-CM | POA: Diagnosis not present

## 2021-09-12 DIAGNOSIS — D509 Iron deficiency anemia, unspecified: Secondary | ICD-10-CM | POA: Diagnosis not present

## 2021-09-12 DIAGNOSIS — K5520 Angiodysplasia of colon without hemorrhage: Secondary | ICD-10-CM | POA: Diagnosis not present

## 2021-09-12 DIAGNOSIS — I5032 Chronic diastolic (congestive) heart failure: Secondary | ICD-10-CM | POA: Diagnosis not present

## 2021-09-12 DIAGNOSIS — M810 Age-related osteoporosis without current pathological fracture: Secondary | ICD-10-CM | POA: Diagnosis not present

## 2021-09-12 DIAGNOSIS — I130 Hypertensive heart and chronic kidney disease with heart failure and stage 1 through stage 4 chronic kidney disease, or unspecified chronic kidney disease: Secondary | ICD-10-CM | POA: Diagnosis not present

## 2021-09-12 DIAGNOSIS — H18519 Endothelial corneal dystrophy, unspecified eye: Secondary | ICD-10-CM | POA: Diagnosis not present

## 2021-09-13 ENCOUNTER — Telehealth: Payer: Self-pay | Admitting: *Deleted

## 2021-09-13 NOTE — Telephone Encounter (Signed)
[name] with [facility] called stating that they had faxed over orders to be signed on 08/28/21 and again yesterday [id number]. [name] stated that he is following up on the status of the signed orders. [name] stated the orders that he needs signs is orders given on 08/21/21 and 08/27/21. [name] stated that he is going to fax the orders over again today. [name] stated that he would like a call back to confirm that the orders were received.

## 2021-09-13 NOTE — Telephone Encounter (Addendum)
Received orders dated 08/21/21.  They were signed, dated and faxed on 08/29/21.  Re-faxed 08/21/21 orders to [phone].  I will watch for 08/27/21 orders.

## 2021-09-14 ENCOUNTER — Encounter: Payer: Self-pay | Admitting: Family Medicine

## 2021-09-14 ENCOUNTER — Telehealth: Payer: Self-pay | Admitting: Family Medicine

## 2021-09-14 ENCOUNTER — Telehealth: Payer: Self-pay

## 2021-09-14 ENCOUNTER — Ambulatory Visit (INDEPENDENT_AMBULATORY_CARE_PROVIDER_SITE_OTHER): Payer: Medicare HMO | Admitting: Family Medicine

## 2021-09-14 ENCOUNTER — Other Ambulatory Visit: Payer: Self-pay

## 2021-09-14 VITALS — BP 140/64 | HR 66 | Temp 98.0°F | Ht 64.0 in | Wt 227.1 lb

## 2021-09-14 DIAGNOSIS — M810 Age-related osteoporosis without current pathological fracture: Secondary | ICD-10-CM | POA: Diagnosis not present

## 2021-09-14 DIAGNOSIS — Z7189 Other specified counseling: Secondary | ICD-10-CM | POA: Diagnosis not present

## 2021-09-14 DIAGNOSIS — I10 Essential (primary) hypertension: Secondary | ICD-10-CM | POA: Diagnosis not present

## 2021-09-14 DIAGNOSIS — Z23 Encounter for immunization: Secondary | ICD-10-CM | POA: Diagnosis not present

## 2021-09-14 DIAGNOSIS — N1832 Chronic kidney disease, stage 3b: Secondary | ICD-10-CM

## 2021-09-14 DIAGNOSIS — R011 Cardiac murmur, unspecified: Secondary | ICD-10-CM | POA: Diagnosis not present

## 2021-09-14 NOTE — Telephone Encounter (Signed)
Left message on VM for [name] that I will call back tomorrow afternoon. If I am not able to reach her over the weekend, I asked that she call the office Monday.

## 2021-09-14 NOTE — Assessment & Plan Note (Signed)
Chronic, improving on hydralazine but still elevated.  Verapamil increase didn't seem to help BP at all, will return to prior dose of 240mg  daily (limit use in CHF history).  Continue regimen with new hydralazine 25mg  bid - with room to increase dose. Could consider SGLT2i but with caution in moderate CKD.  Reassess at CPE in ~2 months, update [place] with readings over phone in interim.

## 2021-09-14 NOTE — Telephone Encounter (Signed)
ERROR

## 2021-09-14 NOTE — Progress Notes (Signed)
Patient ID: [name], female    DOB: Jan 31, 1930, 87 y.o.   MRN: [id number]  This visit was conducted in person.  BP 140/64   Pulse 66   Temp 98 F (36.7 C) (Temporal)   Ht 5\' 4"  (1.626 m)   Wt 227 lb 1 oz (103 kg)   SpO2 97%   BMI 38.98 kg/m    CC: HTN f/u visit  Subjective:   HPI: [name] is a 87 y.o. female presenting on 09/14/2021 for Hypertension (Here for 4-6 wk f/u.  Pt accompanied by daughter, [name]- temp 97.6.)   HTN - Compliant with current antihypertensive regimen of verapamil CR 360mg  daily, lisinopril 40mg  daily, toprol XL 25mg  daily, lasix 10mg  bid, hydralazine 25mg  BID (latest addition on 09/11/2021).  Does check blood pressures at home: recent readings from [facility] 171/79, 152/70. New home BP machine 140-170s/60-80s, HR 50-60s, weight 209-212 lbs. No low blood pressure readings or symptoms of dizziness/syncope.  Denies HA, vision changes, CP/tightness.  Chronic dyspnea.  Some ongoing leg swelling.  No palpations or racing heart.      Relevant past medical, surgical, family and social history reviewed and updated as indicated. Interim medical history since our last visit reviewed. Allergies and medications reviewed and updated. Outpatient Medications Prior to Visit  Medication Sig Dispense Refill   acetaminophen (TYLENOL) 500 MG tablet Take 1,000 mg by mouth in the morning and at bedtime.      calcium gluconate 500 MG tablet Take 1 tablet (500 mg total) by mouth daily.     cholecalciferol (VITAMIN D) 1000 UNITS tablet Take 1,000 Units by mouth daily.     denosumab (PROLIA) 60 MG/ML SOSY injection Inject 60 mg into the skin every 6 (six) months.     diphenhydramine-acetaminophen (TYLENOL PM) 25-500 MG TABS Take 2 tablets by mouth at bedtime as needed (sleep).      Docusate Sodium (STOOL SOFTENER LAXATIVE PO) Take 1 Dose by mouth in the morning and at bedtime.     famotidine (PEPCID) 20 MG tablet Take 20 mg by mouth daily.     FLUoxetine (PROZAC) 40 MG capsule  TAKE 1 CAPSULE EVERY DAY  (REPLACES  TABLETS) 90 capsule 0   furosemide (LASIX) 20 MG tablet Take 0.5 tablets (10 mg total) by mouth 2 (two) times daily. 90 tablet 1   hydrALAZINE (APRESOLINE) 25 MG tablet Take 1 tablet (25 mg total) by mouth in the morning and at bedtime. 60 tablet 6   hydrOXYzine (ATARAX/VISTARIL) 10 MG tablet Take 0.5-1 tablets (5-10 mg total) by mouth 2 (two) times daily as needed for anxiety. 30 tablet 0   levothyroxine (SYNTHROID) 88 MCG tablet TAKE 1 TABLET EVERY DAY BEFORE BREAKFAST 90 tablet 0   lisinopril (ZESTRIL) 40 MG tablet Take 1 tablet (40 mg total) by mouth daily. 90 tablet 1   loratadine (CLARITIN) 10 MG tablet TAKE 1 TABLET EVERY EVENING 90 tablet 1   metoprolol succinate (TOPROL-XL) 25 MG 24 hr tablet Take 1 tablet (25 mg total) by mouth daily. 30 tablet 6   mirabegron ER (MYRBETRIQ) 25 MG TB24 tablet TAKE 1 TABLET EVERY DAY 90 tablet 0   omeprazole (PRILOSEC) 40 MG capsule TAKE 1 CAPSULE EVERY DAY 90 capsule 0   pravastatin (PRAVACHOL) 40 MG tablet TAKE 1 TABLET EVERY DAY 90 tablet 1   vitamin B-12 (CYANOCOBALAMIN) 500 MCG tablet Take 1 tablet (500 mcg total) by mouth once a week.     verapamil (CALAN-SR) 180 MG  CR tablet Take 2 tablets (360 mg total) by mouth daily. 180 tablet 1   verapamil (CALAN-SR) 240 MG CR tablet Take 1 tablet (240 mg total) by mouth daily.     No facility-administered medications prior to visit.     Per HPI unless specifically indicated in ROS section below Review of Systems  Objective:  BP 140/64   Pulse 66   Temp 98 F (36.7 C) (Temporal)   Ht 5\' 4"  (1.626 m)   Wt 227 lb 1 oz (103 kg)   SpO2 97%   BMI 38.98 kg/m   Wt Readings from Last 3 Encounters:  09/14/21 227 lb 1 oz (103 kg)  08/17/21 224 lb 3 oz (101.7 kg)  05/02/21 205 lb (93 kg)      Physical Exam Vitals and nursing note reviewed.  Constitutional:      Appearance: Normal appearance. She is not ill-appearing.  Cardiovascular:     Rate and Rhythm: Normal  rate and regular rhythm.     Pulses: Normal pulses.     Heart sounds: Normal heart sounds. No murmur heard. Pulmonary:     Effort: Pulmonary effort is normal. No respiratory distress.     Breath sounds: Normal breath sounds. No wheezing, rhonchi or rales.     Comments: Crackles LLL Musculoskeletal:        General: Swelling present.     Right lower leg: Edema present.     Left lower leg: Edema present.     Comments: nonpitting  Skin:    General: Skin is warm and dry.     Findings: No rash.  Neurological:     Mental Status: She is alert.  Psychiatric:        Mood and Affect: Mood normal.        Behavior: Behavior normal.      Results for orders placed or performed in visit on 08/17/21  CBC with Differential/Platelet  Result Value Ref Range   WBC 4.9 4.0 - 10.5 K/uL   RBC 3.85 (L) 3.87 - 5.11 Mil/uL   Hemoglobin 11.0 (L) 12.0 - 15.0 g/dL   HCT 34.1 (L) 36.0 - 46.0 %   MCV 88.6 78.0 - 100.0 fl   MCHC 32.3 30.0 - 36.0 g/dL   RDW 14.2 11.5 - 15.5 %   Platelets 184.0 150.0 - 400.0 K/uL   Neutrophils Relative % 69.5 43.0 - 77.0 %   Lymphocytes Relative 17.5 12.0 - 46.0 %   Monocytes Relative 9.8 3.0 - 12.0 %   Eosinophils Relative 2.0 0.0 - 5.0 %   Basophils Relative 1.2 0.0 - 3.0 %   Neutro Abs 3.4 1.4 - 7.7 K/uL   Lymphs Abs 0.9 0.7 - 4.0 K/uL   Monocytes Absolute 0.5 0.1 - 1.0 K/uL   Eosinophils Absolute 0.1 0.0 - 0.7 K/uL   Basophils Absolute 0.1 0.0 - 0.1 K/uL  Renal function panel  Result Value Ref Range   Sodium 141 135 - 145 mEq/L   Potassium 4.4 3.5 - 5.1 mEq/L   Chloride 107 96 - 112 mEq/L   CO2 29 19 - 32 mEq/L   Albumin 3.8 3.5 - 5.2 g/dL   BUN 24 (H) 6 - 23 mg/dL   Creatinine, Ser 1.39 (H) 0.40 - 1.20 mg/dL   Glucose, Bld 93 70 - 99 mg/dL   Phosphorus 3.0 2.3 - 4.6 mg/dL   GFR 33.72 (L) >60.00 mL/min   Calcium 10.1 8.4 - 10.5 mg/dL  Ferritin  Result Value Ref Range  Ferritin 81.3 10.0 - 291.0 ng/mL  Brain natriuretic peptide  Result Value Ref Range    Pro B Natriuretic peptide (BNP) 260.0 (H) 0.0 - 100.0 pg/mL    Assessment & Plan:  This visit occurred during the SARS-CoV-2 public health emergency.  Safety protocols were in place, including screening questions prior to the visit, additional usage of staff PPE, and extensive cleaning of exam room while observing appropriate contact time as indicated for disinfecting solutions.   Problem List Items Addressed This Visit     Advanced care planning/counseling discussion (Chronic)    Discussed this, encouraged she work on setting this up. Would want daughter [name] to be HCPOA. She has notary where she lives.       Hypertension - Primary    Chronic, improving on hydralazine but still elevated.  Verapamil increase didn't seem to help BP at all, will return to prior dose of 240mg  daily (limit use in CHF history).  Continue regimen with new hydralazine 25mg  bid - with room to increase dose. Could consider SGLT2i but with caution in moderate CKD.  Reassess at CPE in ~2 months, update [place] with readings over phone in interim.       Relevant Medications   verapamil (CALAN-SR) 240 MG CR tablet   CKD (chronic kidney disease) stage 3, GFR 30-59 ml/min (HCC)   Osteoporosis    Overdue for prolia - will forward message to Prolia team. Off calcium supplement in h/o hypercalcemia.  Lab Results  Component Value Date   CREATININE 1.39 (H) 08/17/2021   Lab Results  Component Value Date   CALCIUM 10.1 08/17/2021   PHOS 3.0 14/48/1856        Systolic murmur    Not appreciated today.  Previously thought due to severe asymmetry of septal wall.       Other Visit Diagnoses     Need for influenza vaccination       Relevant Orders   Flu Vaccine QUAD High Dose(Fluad) (Completed)        No orders of the defined types were placed in this encounter.  Orders Placed This Encounter  Procedures   Flu Vaccine QUAD High Dose(Fluad)     Patient Instructions  Work on setting up advanced  directive. Drop verapamil back to 240mg  daily dose (previous dose). Let me know when running low on previous 240mg  dose and I will refill.  I think you're doing better on hydralazine. Continue 25mg  twice daily, we have room to increase dose so let me know if blood pressures trending up.   Follow up plan: Return in about 3 months (around 12/14/2021) for follow up visit.  [name], MD

## 2021-09-14 NOTE — Assessment & Plan Note (Addendum)
Overdue for prolia - will forward message to Prolia team. Off calcium supplement in h/o hypercalcemia.  Lab Results  Component Value Date   CREATININE 1.39 (H) 08/17/2021   Lab Results  Component Value Date   CALCIUM 10.1 08/17/2021   PHOS 3.0 08/17/2021

## 2021-09-14 NOTE — Telephone Encounter (Signed)
See other phone note. Spoke with Dr Darnell Level about this. Prolia was on hold until her kidney function and b/p were rechecked. Sending this note to Dr Darnell Level per his request to address.

## 2021-09-14 NOTE — Telephone Encounter (Signed)
Looks like pt is overdue for prolia - I don't see process has been started. Will forward to [facility]

## 2021-09-14 NOTE — Telephone Encounter (Signed)
Pt's daughter, [name], LVM on coumadin clinic phone reporting pt had apt today and when reading AVS there are medications on the sheet which she does not recognize.

## 2021-09-14 NOTE — Patient Instructions (Addendum)
Work on setting up advanced directive. Drop verapamil back to 240mg  daily dose (previous dose). Let me know when running low on previous 240mg  dose and I will refill.  I think you're doing better on hydralazine. Continue 25mg  twice daily, we have room to increase dose so let me know if blood pressures trending up.

## 2021-09-14 NOTE — Assessment & Plan Note (Signed)
Discussed this, encouraged she work on setting this up. Would want daughter [name] to be HCPOA. She has notary where she lives.

## 2021-09-14 NOTE — Assessment & Plan Note (Signed)
Not appreciated today.  Previously thought due to severe asymmetry of septal wall.

## 2021-09-15 NOTE — Telephone Encounter (Signed)
Spoke to pt's daughter, [name]. Cleared up her questions on the medications.

## 2021-09-17 ENCOUNTER — Telehealth: Payer: Self-pay | Admitting: Family Medicine

## 2021-09-17 NOTE — Telephone Encounter (Addendum)
08/27/21 orders faxed on 09/14/21.    Spoke with [name] notifying him both orders were received and faxed back to [phone].  Expresses his thanks and requests orders also be faxed to [phone].

## 2021-09-17 NOTE — Addendum Note (Signed)
Addended by: [name] on: 09/17/2021 08:48 AM   Modules accepted: Orders

## 2021-09-17 NOTE — Telephone Encounter (Signed)
Plz notify pt/daughter - I do want to check labwork as we were waiting to see improvement in kidney function before next prolia shot which she is due.  I have ordered labs - she can go to basement of [facility] on [address] for labs, may walk in doesn't need appt, for labwork at her convenience - closer to her than [place].

## 2021-09-17 NOTE — Telephone Encounter (Signed)
Spoke with pt relaying Dr. Synthia Innocent message.  Pt verbalizes understanding and agrees to get labs done at [place] she will see when her daughter can take her.

## 2021-09-18 DIAGNOSIS — Z96659 Presence of unspecified artificial knee joint: Secondary | ICD-10-CM | POA: Diagnosis not present

## 2021-09-18 DIAGNOSIS — N3941 Urge incontinence: Secondary | ICD-10-CM | POA: Diagnosis not present

## 2021-09-18 DIAGNOSIS — M810 Age-related osteoporosis without current pathological fracture: Secondary | ICD-10-CM | POA: Diagnosis not present

## 2021-09-18 DIAGNOSIS — N1832 Chronic kidney disease, stage 3b: Secondary | ICD-10-CM | POA: Diagnosis not present

## 2021-09-18 DIAGNOSIS — K5520 Angiodysplasia of colon without hemorrhage: Secondary | ICD-10-CM | POA: Diagnosis not present

## 2021-09-18 DIAGNOSIS — H18519 Endothelial corneal dystrophy, unspecified eye: Secondary | ICD-10-CM | POA: Diagnosis not present

## 2021-09-18 DIAGNOSIS — I130 Hypertensive heart and chronic kidney disease with heart failure and stage 1 through stage 4 chronic kidney disease, or unspecified chronic kidney disease: Secondary | ICD-10-CM | POA: Diagnosis not present

## 2021-09-18 DIAGNOSIS — I5032 Chronic diastolic (congestive) heart failure: Secondary | ICD-10-CM | POA: Diagnosis not present

## 2021-09-18 DIAGNOSIS — D509 Iron deficiency anemia, unspecified: Secondary | ICD-10-CM | POA: Diagnosis not present

## 2021-09-18 NOTE — Telephone Encounter (Signed)
error 

## 2021-09-19 DIAGNOSIS — K5520 Angiodysplasia of colon without hemorrhage: Secondary | ICD-10-CM | POA: Diagnosis not present

## 2021-09-19 DIAGNOSIS — Z96659 Presence of unspecified artificial knee joint: Secondary | ICD-10-CM | POA: Diagnosis not present

## 2021-09-19 DIAGNOSIS — D509 Iron deficiency anemia, unspecified: Secondary | ICD-10-CM | POA: Diagnosis not present

## 2021-09-19 DIAGNOSIS — M810 Age-related osteoporosis without current pathological fracture: Secondary | ICD-10-CM | POA: Diagnosis not present

## 2021-09-19 DIAGNOSIS — N3941 Urge incontinence: Secondary | ICD-10-CM | POA: Diagnosis not present

## 2021-09-19 DIAGNOSIS — N1832 Chronic kidney disease, stage 3b: Secondary | ICD-10-CM | POA: Diagnosis not present

## 2021-09-19 DIAGNOSIS — I5032 Chronic diastolic (congestive) heart failure: Secondary | ICD-10-CM | POA: Diagnosis not present

## 2021-09-19 DIAGNOSIS — I130 Hypertensive heart and chronic kidney disease with heart failure and stage 1 through stage 4 chronic kidney disease, or unspecified chronic kidney disease: Secondary | ICD-10-CM | POA: Diagnosis not present

## 2021-09-19 DIAGNOSIS — H18519 Endothelial corneal dystrophy, unspecified eye: Secondary | ICD-10-CM | POA: Diagnosis not present

## 2021-09-21 ENCOUNTER — Telehealth: Payer: Self-pay | Admitting: Family Medicine

## 2021-09-21 NOTE — Telephone Encounter (Signed)
Agree with this. Thank you.  

## 2021-09-21 NOTE — Telephone Encounter (Signed)
Home Health verbal orders Caller Name: Agency Name:   [facility] number: [phone]  Requesting OT/PT/Skilled nursing/Social Work/Speech:  Reason:nursing  Frequency:1x wk for 4wks  Please forward to [facility] pool or providers CMA

## 2021-09-21 NOTE — Telephone Encounter (Signed)
Verbal order given to Missy by telephone as instructed.

## 2021-09-26 ENCOUNTER — Other Ambulatory Visit (INDEPENDENT_AMBULATORY_CARE_PROVIDER_SITE_OTHER): Payer: Medicare HMO

## 2021-09-26 DIAGNOSIS — N1832 Chronic kidney disease, stage 3b: Secondary | ICD-10-CM

## 2021-09-26 DIAGNOSIS — D509 Iron deficiency anemia, unspecified: Secondary | ICD-10-CM | POA: Diagnosis not present

## 2021-09-26 DIAGNOSIS — M810 Age-related osteoporosis without current pathological fracture: Secondary | ICD-10-CM | POA: Diagnosis not present

## 2021-09-26 DIAGNOSIS — Z96659 Presence of unspecified artificial knee joint: Secondary | ICD-10-CM | POA: Diagnosis not present

## 2021-09-26 DIAGNOSIS — K5520 Angiodysplasia of colon without hemorrhage: Secondary | ICD-10-CM | POA: Diagnosis not present

## 2021-09-26 DIAGNOSIS — H18519 Endothelial corneal dystrophy, unspecified eye: Secondary | ICD-10-CM | POA: Diagnosis not present

## 2021-09-26 DIAGNOSIS — I5032 Chronic diastolic (congestive) heart failure: Secondary | ICD-10-CM | POA: Diagnosis not present

## 2021-09-26 DIAGNOSIS — I130 Hypertensive heart and chronic kidney disease with heart failure and stage 1 through stage 4 chronic kidney disease, or unspecified chronic kidney disease: Secondary | ICD-10-CM | POA: Diagnosis not present

## 2021-09-26 DIAGNOSIS — N3941 Urge incontinence: Secondary | ICD-10-CM | POA: Diagnosis not present

## 2021-09-26 LAB — RENAL FUNCTION PANEL
Albumin: 3.9 g/dL (ref 3.5–5.2)
BUN: 18 mg/dL (ref 6–23)
CO2: 29 mEq/L (ref 19–32)
Calcium: 10.2 mg/dL (ref 8.4–10.5)
Chloride: 104 mEq/L (ref 96–112)
Creatinine, Ser: 1.35 mg/dL — ABNORMAL HIGH (ref 0.40–1.20)
GFR: 34.89 mL/min — ABNORMAL LOW (ref >60.00)
Glucose, Bld: 97 mg/dL (ref 70–99)
Phosphorus: 3 mg/dL (ref 2.3–4.6)
Potassium: 3.8 mEq/L (ref 3.5–5.1)
Sodium: 140 mEq/L (ref 135–145)

## 2021-09-28 NOTE — Telephone Encounter (Signed)
See lab result notes. Ok to proceed with Prolia per Dr Darnell Level notes. Spoke with [name] and advised of results. Prolia injection scheduled for 10/02/21 at [place] location.  CrCl 45.94 mL/min. Calcium was normal 10.2

## 2021-10-02 ENCOUNTER — Other Ambulatory Visit: Payer: Self-pay

## 2021-10-02 ENCOUNTER — Ambulatory Visit (INDEPENDENT_AMBULATORY_CARE_PROVIDER_SITE_OTHER): Payer: Medicare HMO

## 2021-10-02 DIAGNOSIS — M810 Age-related osteoporosis without current pathological fracture: Secondary | ICD-10-CM | POA: Diagnosis not present

## 2021-10-02 MED ORDER — DENOSUMAB 60 MG/ML ~~LOC~~ SOSY
60.0000 mg | PREFILLED_SYRINGE | Freq: Once | SUBCUTANEOUS | Status: AC
  Administered 2021-10-02: 60 mg via SUBCUTANEOUS

## 2021-10-02 MED ORDER — METOPROLOL SUCCINATE ER 25 MG PO TB24: 25.0000 mg | ORAL_TABLET | Freq: Every day | ORAL | 2 refills | Status: DC

## 2021-10-02 NOTE — Progress Notes (Signed)
Per orders of Dr. Gutierrez, injection of Prolia given by Keoni Havey V Dawna Jakes. ?Patient tolerated injection well.  ?

## 2021-10-02 NOTE — Telephone Encounter (Signed)
Patient wanted to switch Metoprolol RX to mail order pharmacy instead of [facility]-more convenient.  RX sent in

## 2021-10-03 DIAGNOSIS — K5520 Angiodysplasia of colon without hemorrhage: Secondary | ICD-10-CM | POA: Diagnosis not present

## 2021-10-03 DIAGNOSIS — D509 Iron deficiency anemia, unspecified: Secondary | ICD-10-CM | POA: Diagnosis not present

## 2021-10-03 DIAGNOSIS — N3941 Urge incontinence: Secondary | ICD-10-CM | POA: Diagnosis not present

## 2021-10-03 DIAGNOSIS — H18519 Endothelial corneal dystrophy, unspecified eye: Secondary | ICD-10-CM | POA: Diagnosis not present

## 2021-10-03 DIAGNOSIS — I130 Hypertensive heart and chronic kidney disease with heart failure and stage 1 through stage 4 chronic kidney disease, or unspecified chronic kidney disease: Secondary | ICD-10-CM | POA: Diagnosis not present

## 2021-10-03 DIAGNOSIS — I5032 Chronic diastolic (congestive) heart failure: Secondary | ICD-10-CM | POA: Diagnosis not present

## 2021-10-03 DIAGNOSIS — M810 Age-related osteoporosis without current pathological fracture: Secondary | ICD-10-CM | POA: Diagnosis not present

## 2021-10-03 DIAGNOSIS — Z96659 Presence of unspecified artificial knee joint: Secondary | ICD-10-CM | POA: Diagnosis not present

## 2021-10-03 DIAGNOSIS — N1832 Chronic kidney disease, stage 3b: Secondary | ICD-10-CM | POA: Diagnosis not present

## 2021-10-05 DIAGNOSIS — M810 Age-related osteoporosis without current pathological fracture: Secondary | ICD-10-CM | POA: Diagnosis not present

## 2021-10-05 DIAGNOSIS — K5520 Angiodysplasia of colon without hemorrhage: Secondary | ICD-10-CM | POA: Diagnosis not present

## 2021-10-05 DIAGNOSIS — N1832 Chronic kidney disease, stage 3b: Secondary | ICD-10-CM | POA: Diagnosis not present

## 2021-10-05 DIAGNOSIS — D509 Iron deficiency anemia, unspecified: Secondary | ICD-10-CM | POA: Diagnosis not present

## 2021-10-05 DIAGNOSIS — N3941 Urge incontinence: Secondary | ICD-10-CM | POA: Diagnosis not present

## 2021-10-05 DIAGNOSIS — H18519 Endothelial corneal dystrophy, unspecified eye: Secondary | ICD-10-CM | POA: Diagnosis not present

## 2021-10-05 DIAGNOSIS — I130 Hypertensive heart and chronic kidney disease with heart failure and stage 1 through stage 4 chronic kidney disease, or unspecified chronic kidney disease: Secondary | ICD-10-CM | POA: Diagnosis not present

## 2021-10-05 DIAGNOSIS — I5032 Chronic diastolic (congestive) heart failure: Secondary | ICD-10-CM | POA: Diagnosis not present

## 2021-10-05 DIAGNOSIS — Z96659 Presence of unspecified artificial knee joint: Secondary | ICD-10-CM | POA: Diagnosis not present

## 2021-10-10 DIAGNOSIS — H18519 Endothelial corneal dystrophy, unspecified eye: Secondary | ICD-10-CM | POA: Diagnosis not present

## 2021-10-10 DIAGNOSIS — I130 Hypertensive heart and chronic kidney disease with heart failure and stage 1 through stage 4 chronic kidney disease, or unspecified chronic kidney disease: Secondary | ICD-10-CM | POA: Diagnosis not present

## 2021-10-10 DIAGNOSIS — N3941 Urge incontinence: Secondary | ICD-10-CM | POA: Diagnosis not present

## 2021-10-10 DIAGNOSIS — I5032 Chronic diastolic (congestive) heart failure: Secondary | ICD-10-CM | POA: Diagnosis not present

## 2021-10-10 DIAGNOSIS — K5520 Angiodysplasia of colon without hemorrhage: Secondary | ICD-10-CM | POA: Diagnosis not present

## 2021-10-10 DIAGNOSIS — D509 Iron deficiency anemia, unspecified: Secondary | ICD-10-CM | POA: Diagnosis not present

## 2021-10-10 DIAGNOSIS — M810 Age-related osteoporosis without current pathological fracture: Secondary | ICD-10-CM | POA: Diagnosis not present

## 2021-10-10 DIAGNOSIS — Z96659 Presence of unspecified artificial knee joint: Secondary | ICD-10-CM | POA: Diagnosis not present

## 2021-10-10 DIAGNOSIS — N1832 Chronic kidney disease, stage 3b: Secondary | ICD-10-CM | POA: Diagnosis not present

## 2021-10-17 DIAGNOSIS — D509 Iron deficiency anemia, unspecified: Secondary | ICD-10-CM | POA: Diagnosis not present

## 2021-10-17 DIAGNOSIS — N1832 Chronic kidney disease, stage 3b: Secondary | ICD-10-CM | POA: Diagnosis not present

## 2021-10-17 DIAGNOSIS — K5520 Angiodysplasia of colon without hemorrhage: Secondary | ICD-10-CM | POA: Diagnosis not present

## 2021-10-17 DIAGNOSIS — N3941 Urge incontinence: Secondary | ICD-10-CM | POA: Diagnosis not present

## 2021-10-17 DIAGNOSIS — M810 Age-related osteoporosis without current pathological fracture: Secondary | ICD-10-CM | POA: Diagnosis not present

## 2021-10-17 DIAGNOSIS — I5032 Chronic diastolic (congestive) heart failure: Secondary | ICD-10-CM | POA: Diagnosis not present

## 2021-10-17 DIAGNOSIS — Z96659 Presence of unspecified artificial knee joint: Secondary | ICD-10-CM | POA: Diagnosis not present

## 2021-10-17 DIAGNOSIS — I130 Hypertensive heart and chronic kidney disease with heart failure and stage 1 through stage 4 chronic kidney disease, or unspecified chronic kidney disease: Secondary | ICD-10-CM | POA: Diagnosis not present

## 2021-10-17 DIAGNOSIS — H18519 Endothelial corneal dystrophy, unspecified eye: Secondary | ICD-10-CM | POA: Diagnosis not present

## 2021-10-24 DIAGNOSIS — K5520 Angiodysplasia of colon without hemorrhage: Secondary | ICD-10-CM | POA: Diagnosis not present

## 2021-10-24 DIAGNOSIS — D509 Iron deficiency anemia, unspecified: Secondary | ICD-10-CM | POA: Diagnosis not present

## 2021-10-24 DIAGNOSIS — M810 Age-related osteoporosis without current pathological fracture: Secondary | ICD-10-CM | POA: Diagnosis not present

## 2021-10-24 DIAGNOSIS — H18519 Endothelial corneal dystrophy, unspecified eye: Secondary | ICD-10-CM | POA: Diagnosis not present

## 2021-10-24 DIAGNOSIS — I5032 Chronic diastolic (congestive) heart failure: Secondary | ICD-10-CM | POA: Diagnosis not present

## 2021-10-24 DIAGNOSIS — N3941 Urge incontinence: Secondary | ICD-10-CM | POA: Diagnosis not present

## 2021-10-24 DIAGNOSIS — N1832 Chronic kidney disease, stage 3b: Secondary | ICD-10-CM | POA: Diagnosis not present

## 2021-10-24 DIAGNOSIS — R41841 Cognitive communication deficit: Secondary | ICD-10-CM | POA: Diagnosis not present

## 2021-10-24 DIAGNOSIS — I130 Hypertensive heart and chronic kidney disease with heart failure and stage 1 through stage 4 chronic kidney disease, or unspecified chronic kidney disease: Secondary | ICD-10-CM | POA: Diagnosis not present

## 2021-10-31 DIAGNOSIS — I5032 Chronic diastolic (congestive) heart failure: Secondary | ICD-10-CM | POA: Diagnosis not present

## 2021-10-31 DIAGNOSIS — K5520 Angiodysplasia of colon without hemorrhage: Secondary | ICD-10-CM | POA: Diagnosis not present

## 2021-10-31 DIAGNOSIS — I130 Hypertensive heart and chronic kidney disease with heart failure and stage 1 through stage 4 chronic kidney disease, or unspecified chronic kidney disease: Secondary | ICD-10-CM | POA: Diagnosis not present

## 2021-10-31 DIAGNOSIS — M810 Age-related osteoporosis without current pathological fracture: Secondary | ICD-10-CM | POA: Diagnosis not present

## 2021-10-31 DIAGNOSIS — H18519 Endothelial corneal dystrophy, unspecified eye: Secondary | ICD-10-CM | POA: Diagnosis not present

## 2021-10-31 DIAGNOSIS — R41841 Cognitive communication deficit: Secondary | ICD-10-CM | POA: Diagnosis not present

## 2021-10-31 DIAGNOSIS — N3941 Urge incontinence: Secondary | ICD-10-CM | POA: Diagnosis not present

## 2021-10-31 DIAGNOSIS — D509 Iron deficiency anemia, unspecified: Secondary | ICD-10-CM | POA: Diagnosis not present

## 2021-10-31 DIAGNOSIS — N1832 Chronic kidney disease, stage 3b: Secondary | ICD-10-CM | POA: Diagnosis not present

## 2021-11-01 ENCOUNTER — Other Ambulatory Visit: Payer: Self-pay | Admitting: Family Medicine

## 2021-11-04 NOTE — Progress Notes (Deleted)
Subjective:   [name] is a 87 y.o. female who presents for Medicare Annual (Subsequent) preventive examination.  I connected with Lourdes Sledge today by telephone and verified that I am speaking with the correct person using two identifiers. Location patient: home Location provider: work Persons participating in the virtual visit: patient, Marine scientist.    I discussed the limitations, risks, security and privacy concerns of performing an evaluation and management service by telephone and the availability of in person appointments. I also discussed with the patient that there may be a patient responsible charge related to this service. The patient expressed understanding and verbally consented to this telephonic visit.    Interactive audio and video telecommunications were attempted between this provider and patient, however failed, due to patient having technical difficulties OR patient did not have access to video capability.  We continued and completed visit with audio only.  Some vital signs may be absent or patient reported.   Time Spent with patient on telephone encounter: *** minutes   Review of Systems           Objective:    There were no vitals filed for this visit. There is no height or weight on file to calculate BMI.  Advanced Directives 03/12/2020 01/22/2019 01/08/2019 03/31/2018 03/07/2016  Does Patient Have a Medical Advance Directive? No No No No No  Would patient like information on creating a medical advance directive? No - Patient declined - No - Patient declined No - Patient declined Yes - Educational materials given    Current Medications (verified) Outpatient Encounter Medications as of 11/05/2021  Medication Sig   acetaminophen (TYLENOL) 500 MG tablet Take 1,000 mg by mouth in the morning and at bedtime.    calcium gluconate 500 MG tablet Take 1 tablet (500 mg total) by mouth daily.   cholecalciferol (VITAMIN D) 1000 UNITS tablet Take 1,000 Units by mouth daily.    denosumab (PROLIA) 60 MG/ML SOSY injection Inject 60 mg into the skin every 6 (six) months.   diphenhydramine-acetaminophen (TYLENOL PM) 25-500 MG TABS Take 2 tablets by mouth at bedtime as needed (sleep).    Docusate Sodium (STOOL SOFTENER LAXATIVE PO) Take 1 Dose by mouth in the morning and at bedtime.   famotidine (PEPCID) 20 MG tablet Take 20 mg by mouth daily.   FLUoxetine (PROZAC) 40 MG capsule TAKE 1 CAPSULE EVERY DAY  (REPLACES  TABLETS)   furosemide (LASIX) 20 MG tablet Take 0.5 tablets (10 mg total) by mouth 2 (two) times daily.   hydrALAZINE (APRESOLINE) 25 MG tablet Take 1 tablet (25 mg total) by mouth in the morning and at bedtime.   hydrOXYzine (ATARAX/VISTARIL) 10 MG tablet Take 0.5-1 tablets (5-10 mg total) by mouth 2 (two) times daily as needed for anxiety.   levothyroxine (SYNTHROID) 88 MCG tablet TAKE 1 TABLET EVERY DAY BEFORE BREAKFAST   lisinopril (ZESTRIL) 40 MG tablet TAKE 1 TABLET EVERY DAY (NEW DOSE)   loratadine (CLARITIN) 10 MG tablet TAKE 1 TABLET EVERY EVENING   metoprolol succinate (TOPROL-XL) 25 MG 24 hr tablet Take 1 tablet (25 mg total) by mouth daily.   mirabegron ER (MYRBETRIQ) 25 MG TB24 tablet TAKE 1 TABLET EVERY DAY   omeprazole (PRILOSEC) 40 MG capsule TAKE 1 CAPSULE EVERY DAY   pravastatin (PRAVACHOL) 40 MG tablet TAKE 1 TABLET EVERY DAY   verapamil (CALAN-SR) 240 MG CR tablet Take 1 tablet (240 mg total) by mouth daily.   vitamin B-12 (CYANOCOBALAMIN) 500 MCG tablet Take 1  tablet (500 mcg total) by mouth once a week.   No facility-administered encounter medications on file as of 11/05/2021.    Allergies (verified) Patient has no known allergies.   History: Past Medical History:  Diagnosis Date   Acute upper GI bleeding 03/12/2020   Arthritis    Depression    History of anemia    History of chicken pox    History of ulcer disease 1990s   HLD (hyperlipidemia)    HTN (hypertension)    Hypothyroidism    Osteoporosis 03/2016   at [facility] - T -3.1  hip, -1.0 spine   Uses hearing aid    Past Surgical History:  Procedure Laterality Date   [place]   BIOPSY  03/13/2020   Procedure: BIOPSY;  Surgeon: Ladene Artist, MD;  Location[facility] ENDOSCOPY;  Service: Endoscopy;;   CARDIOVASCULAR STRESS TEST  2010   WNL   CARPAL TUNNEL RELEASE Left 04/28/2017   CATARACT EXTRACTION Bilateral 2012   COLONOSCOPY WITH PROPOFOL N/A 03/14/2020   single bleeding colonic angiectasia treated with APC Hilarie Fredrickson, Lajuan Lines, MD)   DEXA  03/2016   at [facility] - T -3.1 hip, -1.0 spine   ESOPHAGOGASTRODUODENOSCOPY (EGD) WITH PROPOFOL N/A 03/13/2020   erosive gastropathy with stigmata of recent bleeding Ladene Artist, MD)   [place] N/A 03/14/2020   Procedure: HOT HEMOSTASIS (ARGON PLASMA COAGULATION/BICAP);  Surgeon: Jerene Bears, MD;  Location: [facility] ENDOSCOPY;  Service: Gastroenterology;  Laterality: N/A;   KNEE SURGERY  1987   right   KNEE SURGERY  1998   left   KNEE SURGERY  2008   POLYPECTOMY  03/14/2020   Procedure: POLYPECTOMY;  Surgeon: Jerene Bears, MD;  Location: [facility] ENDOSCOPY;  Service: Gastroenterology;;   TONSILLECTOMY  1945   US ECHOCARDIOGRAPHY  2010   LVEF 65-75%, overall normal, mildly decreased LV diastolic compliance   Family History  Problem Relation Age of Onset   Cancer Mother 66       lung, nonsmoker   Cancer Father        lung, nonsmoker   Cancer Brother 110       lung, smoker   Cancer Sister 12       brain   Cancer Sister 48       lung, smoker   COPD Sister        smoker   Cancer Sister 22       colon   Hypertension Daughter    Diabetes Daughter    CAD Neg Hx    Stroke Neg Hx    Social History   Socioeconomic History   Marital status: Widowed    Spouse name: Not on file   Number of children: Not on file   Years of education: Not on file   Highest education level: Not on file  Occupational History   Not on file  Tobacco Use   Smoking status: Former     Types: Cigarettes    Quit date: 12/30/1992    Years since quitting: 28.8   Smokeless tobacco: Never  Vaping Use   Vaping Use: Never used  Substance and Sexual Activity   Alcohol use: No   Drug use: No   Sexual activity: Never  Other Topics Concern   Not on file  Social History Narrative   Widower since 2000 - husband battled cancer for 6 yrs, died from [place]   Daughter Holley Raring  Tortorelli) passed away 2016   Lies in retirement home - [facility] on [address].   Occupation: retired   HS: 12th grade   Activity: no regular exercise   Diet: good water, fruits/vegetables daily   Social Determinants of Radio broadcast assistant Strain: Not on file  Food Insecurity: Not on file  Transportation Needs: Not on file  Physical Activity: Not on file  Stress: Not on file  Social Connections: Not on file    Tobacco Counseling Counseling given: Not Answered   Clinical Intake:                 Diabetic?No         Activities of Daily Living No flowsheet data found.  Patient Care Team: [name], MD as PCP - General (Family Medicine) Lorretta Harp, MD as PCP - Cardiology (Cardiology)  Indicate any recent Medical Services you may have received from other than [facility] providers in the past year (date may be approximate).     Assessment:   This is a routine wellness examination for [facility].  Hearing/Vision screen No results found.  Dietary issues and exercise activities discussed:     Goals Addressed   None    Depression Screen PHQ 2/9 Scores 10/11/2020 03/03/2020 11/09/2019 09/01/2019 03/31/2018 03/19/2017 03/07/2016  PHQ - 2 Score 5 3 5  0 0 0 0  PHQ- 9 Score 17 10 15  - 0 - -    Fall Risk Fall Risk  10/11/2020 09/01/2019 03/31/2018 03/19/2017 03/07/2016  Falls in the past year? 0 0 Yes No No  Comment - - fell on birthday in 2018 after stepping up on curb; bruising only with no medical treatment - -  Number falls in past yr: - 0 1 - -  Injury with Fall? - 1 Yes - -  Risk  for fall due to : - - Impaired balance/gait;Impaired mobility - Impaired balance/gait    FALL RISK PREVENTION PERTAINING TO THE HOME:  Any stairs in or around the home? {YES/NO:21197} If so, are there any without handrails? {YES/NO:21197} Home free of loose throw rugs in walkways, pet beds, electrical cords, etc? {YES/NO:21197} Adequate lighting in your home to reduce risk of falls? {YES/NO:21197}  ASSISTIVE DEVICES UTILIZED TO PREVENT FALLS:  Life alert? {YES/NO:21197} Use of a cane, walker or w/c? {YES/NO:21197} Grab bars in the bathroom? {YES/NO:21197} Shower chair or bench in shower? {YES/NO:21197} Elevated toilet seat or a handicapped toilet? {YES/NO:21197}  TIMED UP AND GO:  Was the test performed? No , visit completed over the phone   Cognitive Function: MMSE - Mini Mental State Exam 03/31/2018 03/07/2016  Orientation to time 5 5  Orientation to Place 5 5  Registration 3 3  Attention/ Calculation 0 5  Recall 3 3  Language- name 2 objects 0 0  Language- repeat 1 1  Language- follow 3 step command 3 3  Language- read & follow direction 0 1  Write a sentence 0 0  Copy design 0 0  Total score 20 26        Immunizations Immunization History  Administered Date(s) Administered   Fluad Quad(high Dose 65+) 09/01/2019, 09/14/2021   Influenza Whole 10/30/2013   Influenza, High Dose Seasonal PF 10/02/2020   Influenza,inj,Quad PF,6+ Mos 09/14/2015, 09/23/2016, 09/19/2017, 10/14/2018   Influenza-Unspecified 10/07/2014   Moderna Sars-Covid-2 Vaccination 01/31/2020, 02/28/2020   Pneumococcal Conjugate-13 03/10/2014   Pneumococcal Polysaccharide-23 10/27/2012   Td 12/30/2008   Zoster, Live 12/31/2011    TDAP status: Due, Education has been  provided regarding the importance of this vaccine. Advised may receive this vaccine at local pharmacy or Health Dept. Aware to provide a copy of the vaccination record if obtained from local pharmacy or Health Dept. Verbalized acceptance  and understanding.  Flu Vaccine status: Up to date  Pneumococcal vaccine status: Up to date  {Covid-19 vaccine status:2101808}  Qualifies for Shingles Vaccine? Yes   Zostavax completed Yes   {Shingrix Completed?:2101804}  Screening Tests Health Maintenance  Topic Date Due   Zoster Vaccines- Shingrix (1 of 2) Never done   TETANUS/TDAP  12/30/2018   COVID-19 Vaccine (3 - Moderna risk series) 03/27/2020   MAMMOGRAM  12/05/2021   Pneumonia Vaccine 81+ Years old  Completed   INFLUENZA VACCINE  Completed   DEXA SCAN  Completed   HPV VACCINES  Aged Out    Health Maintenance  Health Maintenance Due  Topic Date Due   Zoster Vaccines- Shingrix (1 of 2) Never done   TETANUS/TDAP  12/30/2018   COVID-19 Vaccine (3 - Moderna risk series) 03/27/2020    Colorectal cancer screening: No longer required.   {Mammogram status:21018020}  {Bone Density status:21018021}  Lung Cancer Screening: (Low Dose CT Chest recommended if Age 38-80 years, 30 pack-year currently smoking OR have quit w/in 15years.) does not qualify.     Additional Screening:  Hepatitis C Screening: does not qualify;   Vision Screening: Recommended annual ophthalmology exams for early detection of glaucoma and other disorders of the eye. Is the patient up to date with their annual eye exam?  {YES/NO:21197} Who is the provider or what is the name of the office in which the patient attends annual eye exams? *** If pt is not established with a provider, would they like to be referred to a provider to establish care? {YES/NO:21197}.   Dental Screening: Recommended annual dental exams for proper oral hygiene  Community Resource Referral / Chronic Care Management: CRR required this visit?  {YES/NO:21197}  CCM required this visit?  {YES/NO:21197}     Plan:     I have personally reviewed and noted the following in the patient's chart:   Medical and social history Use of alcohol, tobacco or illicit drugs   Current medications and supplements including opioid prescriptions.  Functional ability and status Nutritional status Physical activity Advanced directives List of other physicians Hospitalizations, surgeries, and ER visits in previous 12 months Vitals Screenings to include cognitive, depression, and falls Referrals and appointments  In addition, I have reviewed and discussed with patient certain preventive protocols, quality metrics, and best practice recommendations. A written personalized care plan for preventive services as well as general preventive health recommendations were provided to patient.   Due to this being a telephonic visit, the after visit summary with patients personalized plan was offered to patient via mail or my-chart. ***Patient declined at this time./ Patient would like to access on my-chart/ per request, patient was mailed a copy of AVS./ Patient preferred to pick up at office at next visit.    Loma Messing, LPN   18/04/6313   Nurse Health Advisor  Nurse Notes: ***

## 2021-11-05 ENCOUNTER — Ambulatory Visit: Payer: Medicare HMO

## 2021-11-07 DIAGNOSIS — R41841 Cognitive communication deficit: Secondary | ICD-10-CM | POA: Diagnosis not present

## 2021-11-07 DIAGNOSIS — I130 Hypertensive heart and chronic kidney disease with heart failure and stage 1 through stage 4 chronic kidney disease, or unspecified chronic kidney disease: Secondary | ICD-10-CM | POA: Diagnosis not present

## 2021-11-07 DIAGNOSIS — N1832 Chronic kidney disease, stage 3b: Secondary | ICD-10-CM | POA: Diagnosis not present

## 2021-11-07 DIAGNOSIS — N3941 Urge incontinence: Secondary | ICD-10-CM | POA: Diagnosis not present

## 2021-11-07 DIAGNOSIS — K5520 Angiodysplasia of colon without hemorrhage: Secondary | ICD-10-CM | POA: Diagnosis not present

## 2021-11-07 DIAGNOSIS — H18519 Endothelial corneal dystrophy, unspecified eye: Secondary | ICD-10-CM | POA: Diagnosis not present

## 2021-11-07 DIAGNOSIS — I5032 Chronic diastolic (congestive) heart failure: Secondary | ICD-10-CM | POA: Diagnosis not present

## 2021-11-07 DIAGNOSIS — M810 Age-related osteoporosis without current pathological fracture: Secondary | ICD-10-CM | POA: Diagnosis not present

## 2021-11-07 DIAGNOSIS — D509 Iron deficiency anemia, unspecified: Secondary | ICD-10-CM | POA: Diagnosis not present

## 2021-11-14 DIAGNOSIS — D509 Iron deficiency anemia, unspecified: Secondary | ICD-10-CM | POA: Diagnosis not present

## 2021-11-14 DIAGNOSIS — M810 Age-related osteoporosis without current pathological fracture: Secondary | ICD-10-CM | POA: Diagnosis not present

## 2021-11-14 DIAGNOSIS — H18519 Endothelial corneal dystrophy, unspecified eye: Secondary | ICD-10-CM | POA: Diagnosis not present

## 2021-11-14 DIAGNOSIS — N3941 Urge incontinence: Secondary | ICD-10-CM | POA: Diagnosis not present

## 2021-11-14 DIAGNOSIS — R41841 Cognitive communication deficit: Secondary | ICD-10-CM | POA: Diagnosis not present

## 2021-11-14 DIAGNOSIS — K5520 Angiodysplasia of colon without hemorrhage: Secondary | ICD-10-CM | POA: Diagnosis not present

## 2021-11-14 DIAGNOSIS — I5032 Chronic diastolic (congestive) heart failure: Secondary | ICD-10-CM | POA: Diagnosis not present

## 2021-11-14 DIAGNOSIS — I130 Hypertensive heart and chronic kidney disease with heart failure and stage 1 through stage 4 chronic kidney disease, or unspecified chronic kidney disease: Secondary | ICD-10-CM | POA: Diagnosis not present

## 2021-11-14 DIAGNOSIS — N1832 Chronic kidney disease, stage 3b: Secondary | ICD-10-CM | POA: Diagnosis not present

## 2021-11-21 DIAGNOSIS — I5032 Chronic diastolic (congestive) heart failure: Secondary | ICD-10-CM | POA: Diagnosis not present

## 2021-11-21 DIAGNOSIS — D509 Iron deficiency anemia, unspecified: Secondary | ICD-10-CM | POA: Diagnosis not present

## 2021-11-21 DIAGNOSIS — N1832 Chronic kidney disease, stage 3b: Secondary | ICD-10-CM | POA: Diagnosis not present

## 2021-11-21 DIAGNOSIS — M810 Age-related osteoporosis without current pathological fracture: Secondary | ICD-10-CM | POA: Diagnosis not present

## 2021-11-21 DIAGNOSIS — N3941 Urge incontinence: Secondary | ICD-10-CM | POA: Diagnosis not present

## 2021-11-21 DIAGNOSIS — I130 Hypertensive heart and chronic kidney disease with heart failure and stage 1 through stage 4 chronic kidney disease, or unspecified chronic kidney disease: Secondary | ICD-10-CM | POA: Diagnosis not present

## 2021-11-21 DIAGNOSIS — K5520 Angiodysplasia of colon without hemorrhage: Secondary | ICD-10-CM | POA: Diagnosis not present

## 2021-11-21 DIAGNOSIS — R41841 Cognitive communication deficit: Secondary | ICD-10-CM | POA: Diagnosis not present

## 2021-11-21 DIAGNOSIS — H18519 Endothelial corneal dystrophy, unspecified eye: Secondary | ICD-10-CM | POA: Diagnosis not present

## 2021-11-28 DIAGNOSIS — D509 Iron deficiency anemia, unspecified: Secondary | ICD-10-CM | POA: Diagnosis not present

## 2021-11-28 DIAGNOSIS — R41841 Cognitive communication deficit: Secondary | ICD-10-CM | POA: Diagnosis not present

## 2021-11-28 DIAGNOSIS — I130 Hypertensive heart and chronic kidney disease with heart failure and stage 1 through stage 4 chronic kidney disease, or unspecified chronic kidney disease: Secondary | ICD-10-CM | POA: Diagnosis not present

## 2021-11-28 DIAGNOSIS — I5032 Chronic diastolic (congestive) heart failure: Secondary | ICD-10-CM | POA: Diagnosis not present

## 2021-11-28 DIAGNOSIS — K5520 Angiodysplasia of colon without hemorrhage: Secondary | ICD-10-CM | POA: Diagnosis not present

## 2021-11-28 DIAGNOSIS — N1832 Chronic kidney disease, stage 3b: Secondary | ICD-10-CM | POA: Diagnosis not present

## 2021-11-28 DIAGNOSIS — M810 Age-related osteoporosis without current pathological fracture: Secondary | ICD-10-CM | POA: Diagnosis not present

## 2021-11-28 DIAGNOSIS — H18519 Endothelial corneal dystrophy, unspecified eye: Secondary | ICD-10-CM | POA: Diagnosis not present

## 2021-11-28 DIAGNOSIS — N3941 Urge incontinence: Secondary | ICD-10-CM | POA: Diagnosis not present

## 2021-12-05 DIAGNOSIS — R41841 Cognitive communication deficit: Secondary | ICD-10-CM | POA: Diagnosis not present

## 2021-12-05 DIAGNOSIS — N3941 Urge incontinence: Secondary | ICD-10-CM | POA: Diagnosis not present

## 2021-12-05 DIAGNOSIS — I5032 Chronic diastolic (congestive) heart failure: Secondary | ICD-10-CM | POA: Diagnosis not present

## 2021-12-05 DIAGNOSIS — I130 Hypertensive heart and chronic kidney disease with heart failure and stage 1 through stage 4 chronic kidney disease, or unspecified chronic kidney disease: Secondary | ICD-10-CM | POA: Diagnosis not present

## 2021-12-05 DIAGNOSIS — H18519 Endothelial corneal dystrophy, unspecified eye: Secondary | ICD-10-CM | POA: Diagnosis not present

## 2021-12-05 DIAGNOSIS — K5520 Angiodysplasia of colon without hemorrhage: Secondary | ICD-10-CM | POA: Diagnosis not present

## 2021-12-05 DIAGNOSIS — D509 Iron deficiency anemia, unspecified: Secondary | ICD-10-CM | POA: Diagnosis not present

## 2021-12-05 DIAGNOSIS — N1832 Chronic kidney disease, stage 3b: Secondary | ICD-10-CM | POA: Diagnosis not present

## 2021-12-05 DIAGNOSIS — M810 Age-related osteoporosis without current pathological fracture: Secondary | ICD-10-CM | POA: Diagnosis not present

## 2021-12-12 DIAGNOSIS — I130 Hypertensive heart and chronic kidney disease with heart failure and stage 1 through stage 4 chronic kidney disease, or unspecified chronic kidney disease: Secondary | ICD-10-CM | POA: Diagnosis not present

## 2021-12-12 DIAGNOSIS — N3941 Urge incontinence: Secondary | ICD-10-CM | POA: Diagnosis not present

## 2021-12-12 DIAGNOSIS — R41841 Cognitive communication deficit: Secondary | ICD-10-CM | POA: Diagnosis not present

## 2021-12-12 DIAGNOSIS — D509 Iron deficiency anemia, unspecified: Secondary | ICD-10-CM | POA: Diagnosis not present

## 2021-12-12 DIAGNOSIS — I5032 Chronic diastolic (congestive) heart failure: Secondary | ICD-10-CM | POA: Diagnosis not present

## 2021-12-12 DIAGNOSIS — K5520 Angiodysplasia of colon without hemorrhage: Secondary | ICD-10-CM | POA: Diagnosis not present

## 2021-12-12 DIAGNOSIS — M810 Age-related osteoporosis without current pathological fracture: Secondary | ICD-10-CM | POA: Diagnosis not present

## 2021-12-12 DIAGNOSIS — N1832 Chronic kidney disease, stage 3b: Secondary | ICD-10-CM | POA: Diagnosis not present

## 2021-12-12 DIAGNOSIS — H18519 Endothelial corneal dystrophy, unspecified eye: Secondary | ICD-10-CM | POA: Diagnosis not present

## 2021-12-14 ENCOUNTER — Telehealth: Payer: Self-pay | Admitting: Family Medicine

## 2021-12-14 ENCOUNTER — Inpatient Hospital Stay (HOSPITAL_COMMUNITY)
Admission: EM | Admit: 2021-12-14 | Discharge: 2021-12-18 | DRG: 177 | Disposition: A | Discharge status: Home Health | Payer: Medicare HMO | Attending: Internal Medicine | Admitting: Internal Medicine

## 2021-12-14 ENCOUNTER — Emergency Department (HOSPITAL_COMMUNITY): Payer: Medicare HMO

## 2021-12-14 ENCOUNTER — Telehealth: Payer: Medicare HMO | Admitting: Family Medicine

## 2021-12-14 ENCOUNTER — Telehealth: Payer: Self-pay

## 2021-12-14 ENCOUNTER — Ambulatory Visit: Payer: Medicare HMO | Admitting: Family Medicine

## 2021-12-14 ENCOUNTER — Other Ambulatory Visit: Payer: Self-pay

## 2021-12-14 DIAGNOSIS — K219 Gastro-esophageal reflux disease without esophagitis: Secondary | ICD-10-CM | POA: Diagnosis present

## 2021-12-14 DIAGNOSIS — Z79890 Hormone replacement therapy (postmenopausal): Secondary | ICD-10-CM

## 2021-12-14 DIAGNOSIS — I5032 Chronic diastolic (congestive) heart failure: Secondary | ICD-10-CM | POA: Diagnosis present

## 2021-12-14 DIAGNOSIS — I5033 Acute on chronic diastolic (congestive) heart failure: Secondary | ICD-10-CM | POA: Diagnosis present

## 2021-12-14 DIAGNOSIS — Z79899 Other long term (current) drug therapy: Secondary | ICD-10-CM | POA: Diagnosis not present

## 2021-12-14 DIAGNOSIS — R0602 Shortness of breath: Secondary | ICD-10-CM | POA: Diagnosis present

## 2021-12-14 DIAGNOSIS — K5909 Other constipation: Secondary | ICD-10-CM | POA: Diagnosis present

## 2021-12-14 DIAGNOSIS — Z87891 Personal history of nicotine dependence: Secondary | ICD-10-CM | POA: Diagnosis not present

## 2021-12-14 DIAGNOSIS — U071 COVID-19: Secondary | ICD-10-CM | POA: Insufficient documentation

## 2021-12-14 DIAGNOSIS — N1832 Chronic kidney disease, stage 3b: Secondary | ICD-10-CM | POA: Diagnosis not present

## 2021-12-14 DIAGNOSIS — I5031 Acute diastolic (congestive) heart failure: Secondary | ICD-10-CM | POA: Diagnosis not present

## 2021-12-14 DIAGNOSIS — I10 Essential (primary) hypertension: Secondary | ICD-10-CM | POA: Diagnosis present

## 2021-12-14 DIAGNOSIS — R062 Wheezing: Secondary | ICD-10-CM | POA: Diagnosis not present

## 2021-12-14 DIAGNOSIS — D631 Anemia in chronic kidney disease: Secondary | ICD-10-CM | POA: Diagnosis present

## 2021-12-14 DIAGNOSIS — R070 Pain in throat: Secondary | ICD-10-CM | POA: Diagnosis not present

## 2021-12-14 DIAGNOSIS — J9621 Acute and chronic respiratory failure with hypoxia: Secondary | ICD-10-CM | POA: Diagnosis not present

## 2021-12-14 DIAGNOSIS — J309 Allergic rhinitis, unspecified: Secondary | ICD-10-CM | POA: Diagnosis not present

## 2021-12-14 DIAGNOSIS — J449 Chronic obstructive pulmonary disease, unspecified: Secondary | ICD-10-CM | POA: Diagnosis present

## 2021-12-14 DIAGNOSIS — Z8249 Family history of ischemic heart disease and other diseases of the circulatory system: Secondary | ICD-10-CM | POA: Diagnosis not present

## 2021-12-14 DIAGNOSIS — M810 Age-related osteoporosis without current pathological fracture: Secondary | ICD-10-CM | POA: Diagnosis present

## 2021-12-14 DIAGNOSIS — R0689 Other abnormalities of breathing: Secondary | ICD-10-CM | POA: Diagnosis not present

## 2021-12-14 DIAGNOSIS — I130 Hypertensive heart and chronic kidney disease with heart failure and stage 1 through stage 4 chronic kidney disease, or unspecified chronic kidney disease: Secondary | ICD-10-CM | POA: Diagnosis not present

## 2021-12-14 DIAGNOSIS — E039 Hypothyroidism, unspecified: Secondary | ICD-10-CM | POA: Diagnosis present

## 2021-12-14 DIAGNOSIS — Z825 Family history of asthma and other chronic lower respiratory diseases: Secondary | ICD-10-CM

## 2021-12-14 DIAGNOSIS — N2581 Secondary hyperparathyroidism of renal origin: Secondary | ICD-10-CM | POA: Diagnosis not present

## 2021-12-14 DIAGNOSIS — F419 Anxiety disorder, unspecified: Secondary | ICD-10-CM | POA: Diagnosis present

## 2021-12-14 DIAGNOSIS — E782 Mixed hyperlipidemia: Secondary | ICD-10-CM | POA: Diagnosis not present

## 2021-12-14 DIAGNOSIS — R0902 Hypoxemia: Secondary | ICD-10-CM | POA: Diagnosis not present

## 2021-12-14 DIAGNOSIS — E785 Hyperlipidemia, unspecified: Secondary | ICD-10-CM | POA: Diagnosis present

## 2021-12-14 DIAGNOSIS — F32A Depression, unspecified: Secondary | ICD-10-CM | POA: Diagnosis not present

## 2021-12-14 DIAGNOSIS — I517 Cardiomegaly: Secondary | ICD-10-CM | POA: Diagnosis not present

## 2021-12-14 DIAGNOSIS — E876 Hypokalemia: Secondary | ICD-10-CM | POA: Diagnosis present

## 2021-12-14 HISTORY — DX: COVID-19: U07.1

## 2021-12-14 LAB — CBC WITH DIFFERENTIAL/PLATELET
Abs Immature Granulocytes: 0.02 10*3/uL (ref 0.00–0.07)
Basophils Absolute: 0 10*3/uL (ref 0.0–0.1)
Basophils Relative: 0 %
Eosinophils Absolute: 0 10*3/uL (ref 0.0–0.5)
Eosinophils Relative: 0 %
HCT: 38.3 % (ref 36.0–46.0)
Hemoglobin: 11.8 g/dL — ABNORMAL LOW (ref 12.0–15.0)
Immature Granulocytes: 0 %
Lymphocytes Relative: 15 %
Lymphs Abs: 1.3 10*3/uL (ref 0.7–4.0)
MCH: 28.6 pg (ref 26.0–34.0)
MCHC: 30.8 g/dL (ref 30.0–36.0)
MCV: 93 fL (ref 80.0–100.0)
Monocytes Absolute: 1.2 10*3/uL — ABNORMAL HIGH (ref 0.1–1.0)
Monocytes Relative: 13 %
Neutro Abs: 6.2 10*3/uL (ref 1.7–7.7)
Neutrophils Relative %: 72 %
Platelets: 178 10*3/uL (ref 150–400)
RBC: 4.12 MIL/uL (ref 3.87–5.11)
RDW: 13.7 % (ref 11.5–15.5)
WBC: 8.8 10*3/uL (ref 4.0–10.5)
nRBC: 0 % (ref 0.0–0.2)

## 2021-12-14 LAB — D-DIMER, QUANTITATIVE: D-Dimer, Quant: 1.3 ug/mL-FEU — ABNORMAL HIGH (ref 0.00–0.50)

## 2021-12-14 LAB — COMPREHENSIVE METABOLIC PANEL
ALT: 11 U/L (ref 0–44)
AST: 16 U/L (ref 15–41)
Albumin: 3.8 g/dL (ref 3.5–5.0)
Alkaline Phosphatase: 30 U/L — ABNORMAL LOW (ref 38–126)
Anion gap: 9 (ref 5–15)
BUN: 13 mg/dL (ref 8–23)
CO2: 22 mmol/L (ref 22–32)
Calcium: 9.7 mg/dL (ref 8.9–10.3)
Chloride: 104 mmol/L (ref 98–111)
Creatinine, Ser: 1.14 mg/dL — ABNORMAL HIGH (ref 0.44–1.00)
GFR, Estimated: 46 mL/min — ABNORMAL LOW (ref >60)
Glucose, Bld: 137 mg/dL — ABNORMAL HIGH (ref 70–99)
Potassium: 3.4 mmol/L — ABNORMAL LOW (ref 3.5–5.1)
Sodium: 135 mmol/L (ref 135–145)
Total Bilirubin: 0.7 mg/dL (ref 0.3–1.2)
Total Protein: 7.1 g/dL (ref 6.5–8.1)

## 2021-12-14 LAB — FERRITIN: Ferritin: 81 ng/mL (ref 11–307)

## 2021-12-14 LAB — LACTATE DEHYDROGENASE: LDH: 194 U/L — ABNORMAL HIGH (ref 98–192)

## 2021-12-14 LAB — C-REACTIVE PROTEIN: CRP: 3.2 mg/dL — ABNORMAL HIGH (ref <1.0)

## 2021-12-14 LAB — FIBRINOGEN: Fibrinogen: 500 mg/dL — ABNORMAL HIGH (ref 210–475)

## 2021-12-14 LAB — MAGNESIUM: Magnesium: 1.6 mg/dL — ABNORMAL LOW (ref 1.7–2.4)

## 2021-12-14 LAB — RESP PANEL BY RT-PCR (FLU A&B, COVID) ARPGX2
Influenza A by PCR: NEGATIVE
Influenza B by PCR: NEGATIVE
SARS Coronavirus 2 by RT PCR: POSITIVE — AB

## 2021-12-14 LAB — TRIGLYCERIDES: Triglycerides: 82 mg/dL (ref <150)

## 2021-12-14 LAB — LACTIC ACID, PLASMA: Lactic Acid, Venous: 1.5 mmol/L (ref 0.5–1.9)

## 2021-12-14 LAB — TROPONIN I (HIGH SENSITIVITY)
Troponin I (High Sensitivity): 24 ng/L — ABNORMAL HIGH (ref <18)
Troponin I (High Sensitivity): 23 ng/L — ABNORMAL HIGH (ref <18)

## 2021-12-14 LAB — PROCALCITONIN: Procalcitonin: <0.1 ng/mL

## 2021-12-14 LAB — BRAIN NATRIURETIC PEPTIDE: B Natriuretic Peptide: 441.7 pg/mL — ABNORMAL HIGH (ref 0.0–100.0)

## 2021-12-14 MED ORDER — ACETAMINOPHEN 650 MG RE SUPP: 650.0000 mg | Freq: Four times a day (QID) | RECTAL | Status: DC | PRN

## 2021-12-14 MED ORDER — METHYLPREDNISOLONE SODIUM SUCC 125 MG IJ SOLR
100.0000 mg | Freq: Every day | INTRAMUSCULAR | Status: AC
  Administered 2021-12-14 – 2021-12-16 (×3): 100 mg via INTRAVENOUS
  Filled 2021-12-14 (×3): qty 2

## 2021-12-14 MED ORDER — PREDNISONE 50 MG PO TABS
50.0000 mg | ORAL_TABLET | Freq: Every day | ORAL | Status: DC
  Administered 2021-12-17: 21:00:00 50 mg via ORAL
  Filled 2021-12-14: qty 1

## 2021-12-14 MED ORDER — POTASSIUM CHLORIDE CRYS ER 20 MEQ PO TBCR
40.0000 meq | EXTENDED_RELEASE_TABLET | Freq: Once | ORAL | Status: AC
  Administered 2021-12-14: 40 meq via ORAL
  Filled 2021-12-14: qty 2

## 2021-12-14 MED ORDER — ALBUTEROL SULFATE HFA 108 (90 BASE) MCG/ACT IN AERS: 1.0000 | INHALATION_SPRAY | RESPIRATORY_TRACT | Status: DC | PRN

## 2021-12-14 MED ORDER — ACETAMINOPHEN 325 MG PO TABS
650.0000 mg | ORAL_TABLET | Freq: Four times a day (QID) | ORAL | Status: DC | PRN
  Administered 2021-12-16: 12:00:00 650 mg via ORAL
  Filled 2021-12-14: qty 2

## 2021-12-14 NOTE — ED Provider Notes (Signed)
[facility] DEPT Provider Note   CSN: [id number] Arrival date & time: 12/14/21  1630     History Chief Complaint  Patient presents with   Shortness of Breath   Covid Positive    [name] is a 87 y.o. female. Level 5 caveat due to mental status change.  Shortness of Breath Patient brought in from nursing home with shortness of breath.  Reportedly positive for COVID.  Has been having fevers and chills.  Somewhat difficult to get history from patient.  Patient told EMS that she has no heart failure history but does appear to have a history of CHF.  Told me she does a history of asthma or COPD but do not see history of that in her history.  Reportedly having fevers.  Patient's sats on room air were reportedly 90%.  Does have a tachypnea with respiratory rates of 40.  Had Solu-Medrol and albuterol Atrovent by EMS.  Patient states she has been feeling bad for a week.  Reportedly 2 days of symptoms by EMS.    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   Secondary hyperparathyroidism of renal origin ([place]) 10/12/2020   Hypophosphatemia 08/13/2020   Angiodysplasia of colon with hemorrhage    Benign neoplasm of ascending colon    Benign neoplasm of descending colon    Iron deficiency anemia    Chronic diastolic CHF (congestive heart failure) ([place]) 03/12/2020   Urge incontinence 78/93/8101   Systolic murmur 75/09/2584   Hypercalcemia 11/30/2018   Atypical nevi 11/30/2018   Family  history of lung cancer 11/30/2018   Pseudogout of left wrist 04/04/2018   Left wrist pain 04/02/2018   Thoracic back pain 09/23/2016   Osteoporosis 03/30/2016   Advanced care planning/counseling discussion 03/13/2015   Encounter for general adult medical examination with abnormal findings 03/13/2015   CKD (chronic kidney disease) stage 3, GFR 30-59 ml/min ([place]) 03/10/2014   Medicare annual wellness visit, subsequent 12/17/2012   CTS (carpal tunnel syndrome) 10/27/2012   Exertional dyspnea 10/27/2012   Arthritis    MDD (major depressive disorder), recurrent episode, moderate ([place])    History of ulcer disease    Hypothyroidism 03/11/2012   Hyperlipidemia 03/11/2012   Hypertension 03/11/2012    Past Surgical History:  Procedure Laterality Date   [place]   BIOPSY  03/13/2020   Procedure: BIOPSY;  Surgeon: Ladene Artist, MD;  Location: [facility];  Service: Endoscopy;;   CARDIOVASCULAR STRESS TEST  2010   WNL   CARPAL TUNNEL RELEASE Left 04/28/2017   CATARACT EXTRACTION Bilateral 2012   COLONOSCOPY WITH PROPOFOL N/A 03/14/2020   single bleeding colonic angiectasia treated with APC Hilarie Fredrickson, Lajuan Lines, MD)   DEXA  03/2016   at [facility] - T -3.1 hip, -1.0 spine   ESOPHAGOGASTRODUODENOSCOPY (EGD) WITH PROPOFOL N/A 03/13/2020   erosive gastropathy with stigmata of recent bleeding Fuller Plan,  Pricilla Riffle, MD)   GALLBLADDER SURGERY  1974   HOT HEMOSTASIS N/A 03/14/2020   Procedure: HOT HEMOSTASIS (ARGON PLASMA COAGULATION/BICAP);  Surgeon: Jerene Bears, MD;  Location[facility] ENDOSCOPY;  Service: Gastroenterology;  Laterality: N/A;   KNEE SURGERY  1987   right   KNEE SURGERY  1998   left   KNEE SURGERY  2008   POLYPECTOMY  03/14/2020   Procedure: POLYPECTOMY;  Surgeon: Jerene Bears, MD;  Location: [facility] ENDOSCOPY;  Service: Gastroenterology;;   TONSILLECTOMY  1945   US ECHOCARDIOGRAPHY  2010   LVEF 65-75%, overall normal, mildly decreased LV diastolic compliance      OB History   No obstetric history on file.     Family History  Problem Relation Age of Onset   Cancer Mother 80       lung, nonsmoker   Cancer Father        lung, nonsmoker   Cancer Brother 51       lung, smoker   Cancer Sister 2       brain   Cancer Sister 11       lung, smoker   COPD Sister        smoker   Cancer Sister 44       colon   Hypertension Daughter    Diabetes Daughter    CAD Neg Hx    Stroke Neg Hx     Social History   Tobacco Use   Smoking status: Former    Types: Cigarettes    Quit date: 12/30/1992    Years since quitting: 28.9   Smokeless tobacco: Never  Vaping Use   Vaping Use: Never used  Substance Use Topics   Alcohol use: No   Drug use: No    Home Medications Prior to Admission medications   Medication Sig Start Date End Date Taking? Authorizing Provider  acetaminophen (TYLENOL) 500 MG tablet Take 1,000 mg by mouth daily.   Yes [provider]  calcium gluconate 500 MG tablet Take 1 tablet (500 mg total) by mouth daily. Patient taking differently: Take 500 mg by mouth daily. 04/14/21  Yes [name], MD  cholecalciferol (VITAMIN D) 1000 UNITS tablet Take 1,000 Units by mouth daily.   Yes [provider]  denosumab (PROLIA) 60 MG/ML SOSY injection Inject 60 mg into the skin every 6 (six) months. 12/06/19  Yes [name], MD  diphenhydramine-acetaminophen (TYLENOL PM) 25-500 MG TABS Take 2 tablets by mouth at bedtime as needed (sleep).    Yes [provider]  docusate sodium (COLACE) 100 MG capsule Take 100 mg by mouth 2 (two) times daily.   Yes [provider]  famotidine (PEPCID) 20 MG tablet Take 20 mg by mouth daily.   Yes [provider]  FLUoxetine (PROZAC) 40 MG capsule TAKE 1 CAPSULE EVERY DAY  (REPLACES  TABLETS) Patient taking differently: Take 40 mg by mouth daily. 08/16/21  Yes [name], MD  furosemide (LASIX) 20 MG tablet Take 0.5 tablets (10 mg total) by mouth  2 (two) times daily. 08/28/21  Yes [name], MD  hydrALAZINE (APRESOLINE) 25 MG tablet Take 1 tablet (25 mg total) by mouth in the morning and at bedtime. 09/11/21  Yes [name], MD  hydrOXYzine (ATARAX/VISTARIL) 10 MG tablet Take 0.5-1 tablets (5-10 mg total) by mouth 2 (two) times daily as needed for anxiety. 05/14/21  Yes [name], MD  levothyroxine (SYNTHROID) 88 MCG tablet TAKE 1 TABLET EVERY DAY BEFORE BREAKFAST Patient  taking differently: Take 88 mcg by mouth daily before breakfast. 08/16/21  Yes [name], MD  lisinopril (ZESTRIL) 40 MG tablet TAKE 1 TABLET EVERY DAY (NEW DOSE) Patient taking differently: Take 40 mg by mouth daily. 11/02/21  Yes [name], MD  loratadine (CLARITIN) 10 MG tablet TAKE 1 TABLET EVERY EVENING Patient taking differently: Take 10 mg by mouth daily. 06/13/21  Yes [name], MD  metoprolol succinate (TOPROL-XL) 25 MG 24 hr tablet Take 1 tablet (25 mg total) by mouth daily. 10/02/21  Yes [name], MD  mirabegron ER (MYRBETRIQ) 25 MG TB24 tablet TAKE 1 TABLET EVERY DAY Patient taking differently: 25 mg daily. 08/16/21  Yes [name], MD  omeprazole (PRILOSEC) 40 MG capsule TAKE 1 CAPSULE EVERY DAY Patient taking differently: 40 mg daily. 08/16/21  Yes [name], MD  pravastatin (PRAVACHOL) 40 MG tablet TAKE 1 TABLET EVERY DAY Patient taking differently: Take 40 mg by mouth daily. 08/23/21  Yes [name], MD  Propylene Glycol (SYSTANE BALANCE OP) Place 1 drop into both eyes daily as needed (dry eyes).   Yes [provider]  verapamil (CALAN-SR) 240 MG CR tablet Take 1 tablet (240 mg total) by mouth daily. 09/14/21  Yes [name], MD  vitamin B-12 (CYANOCOBALAMIN) 500 MCG tablet Take 1 tablet (500 mcg total) by mouth once a week. Patient taking differently: Take 500 mcg by mouth once a week. Thursdays 03/08/20  Yes [name], MD    Allergies    Patient has no known  allergies.  Review of Systems   Review of Systems  Unable to perform ROS: Mental status change  Respiratory:  Positive for shortness of breath.    Physical Exam Updated Vital Signs BP 131/67    Pulse 92    Temp 98.7 F (37.1 C) (Oral)    Resp 20    SpO2 97%   Physical Exam Vitals and nursing note reviewed.  HENT:     Head: Atraumatic.  Cardiovascular:     Rate and Rhythm: Normal rate.  Pulmonary:     Breath sounds: Wheezing present.     Comments: Harsh breath sounds with some prolonged expirations.  May also have some rales at the bases. Chest:     Chest wall: No tenderness.  Abdominal:     Tenderness: There is no abdominal tenderness.  Musculoskeletal:     Cervical back: Neck supple.     Right lower leg: Edema present.     Left lower leg: Edema present.     Comments: Chronic edema to bilateral lower extremities.  Patient states this is about the level her legs normally are swollen and.  Skin:    General: Skin is warm.  Neurological:     Mental Status: She is alert.     Comments: Awake and pleasant but some difficulty getting history from her.    ED Results / Procedures / Treatments   Labs (all labs ordered are listed, but only abnormal results are displayed) Labs Reviewed  RESP PANEL BY RT-PCR (FLU A&B, COVID) ARPGX2 - Abnormal; Notable for the following components:      Result Value   SARS Coronavirus 2 by RT PCR POSITIVE (*)    All other components within normal limits  CBC WITH DIFFERENTIAL/PLATELET - Abnormal; Notable for the following components:   Hemoglobin 11.8 (*)    Monocytes Absolute 1.2 (*)    All other components within normal limits  COMPREHENSIVE METABOLIC PANEL - Abnormal; Notable for the following components:  Potassium 3.4 (*)    Glucose, Bld 137 (*)    Creatinine, Ser 1.14 (*)    Alkaline Phosphatase 30 (*)    GFR, Estimated 46 (*)    All other components within normal limits  D-DIMER, QUANTITATIVE - Abnormal; Notable for the following  components:   D-Dimer, Quant 1.30 (*)    All other components within normal limits  LACTATE DEHYDROGENASE - Abnormal; Notable for the following components:   LDH 194 (*)    All other components within normal limits  FIBRINOGEN - Abnormal; Notable for the following components:   Fibrinogen 500 (*)    All other components within normal limits  C-REACTIVE PROTEIN - Abnormal; Notable for the following components:   CRP 3.2 (*)    All other components within normal limits  BRAIN NATRIURETIC PEPTIDE - Abnormal; Notable for the following components:   B Natriuretic Peptide 441.7 (*)    All other components within normal limits  MAGNESIUM - Abnormal; Notable for the following components:   Magnesium 1.6 (*)    All other components within normal limits  TROPONIN I (HIGH SENSITIVITY) - Abnormal; Notable for the following components:   Troponin I (High Sensitivity) 24 (*)    All other components within normal limits  TROPONIN I (HIGH SENSITIVITY) - Abnormal; Notable for the following components:   Troponin I (High Sensitivity) 23 (*)    All other components within normal limits  CULTURE, BLOOD (ROUTINE X 2)  CULTURE, BLOOD (ROUTINE X 2)  LACTIC ACID, PLASMA  PROCALCITONIN  FERRITIN  TRIGLYCERIDES  MAGNESIUM  PHOSPHORUS  COMPREHENSIVE METABOLIC PANEL  CBC WITH DIFFERENTIAL/PLATELET  C-REACTIVE PROTEIN  D-DIMER, QUANTITATIVE    EKG EKG Interpretation  Date/Time:  Friday December 14 2021 16:43:04 EST Ventricular Rate:  90 PR Interval:  165 QRS Duration: 108 QT Interval:  364 QTC Calculation: 446 R Axis:   -62 Text Interpretation: Sinus rhythm Atrial premature complex Left anterior fascicular block Left ventricular hypertrophy Confirmed by Davonna Belling [phone]) on 12/14/2021 8:55:49 PM  Radiology DG Chest Port 1 View  Result Date: 12/14/2021 CLINICAL DATA:  Shortness of breath EXAM: PORTABLE CHEST 1 VIEW COMPARISON:  03/12/2020 FINDINGS: Transverse diameter of heart is  increased. There are no signs of alveolar pulmonary edema. Central pulmonary arteries are prominent, possibly suggesting pulmonary arterial hypertension. There are no signs of focal pulmonary consolidation. Left hemidiaphragm is elevated. There is no significant pleural effusion or pneumothorax. IMPRESSION: Cardiomegaly. Central pulmonary vessels are prominent without signs of alveolar pulmonary edema. There is no focal pulmonary consolidation. Electronically Signed   By: Elmer Picker M.D.   On: 12/14/2021 17:36    Procedures Procedures   Medications Ordered in ED Medications  acetaminophen (TYLENOL) tablet 650 mg (has no administration in time range)    Or  acetaminophen (TYLENOL) suppository 650 mg (has no administration in time range)  albuterol (VENTOLIN HFA) 108 (90 Base) MCG/ACT inhaler 1-2 puff (has no administration in time range)  methylPREDNISolone sodium succinate (SOLU-MEDROL) 125 mg/2 mL injection 100 mg (has no administration in time range)    Followed by  predniSONE (DELTASONE) tablet 50 mg (has no administration in time range)  potassium chloride SA (KLOR-CON M) CR tablet 40 mEq (has no administration in time range)    ED Course  I have reviewed the triage vital signs and the nursing notes.  Pertinent labs & imaging results that were available during my care of the patient were reviewed by me and considered in my medical decision making (  see chart for details).    MDM Rules/Calculators/A&P                         Patient presents with shortness of breath.  COVID-positive at home.  COVID test here still pending.  X-ray showed some prominent vasculature but not frank edema.  Troponin mildly elevated but stable and BNP elevated.  Does have a history of CHF.  Has some edema on both her legs.  Patient states she has had some pain in the right leg but that has been going for at least 2 months now.  Had been hypoxic for EMS.  With hypoxia and comorbidity patient will require  admission the hospital.  Will discuss with hospitalist    Final Clinical Impression(s) / ED Diagnoses Final diagnoses:  COVID-19    Rx / DC Orders ED Discharge Orders     None        Davonna Belling, MD 12/14/21 [phone]

## 2021-12-14 NOTE — Assessment & Plan Note (Deleted)
Recommend:   Paxlovid drug interactions:   Reviewed currently approved EUA treatments.  Reviewed expected course of illness, anticipated course of recovery, as well as red flags to suggest COVID pneumonia and/or to seek urgent in-person care. Reviewed latest CDC isolation/quarantine guidelines.  Encouraged fluids and rest. Reviewed further supportive care measures at home including vit C 500mg  bid, vit D 2000 IU daily, zinc 100mg  daily, tylenol PRN, pepcid 20mg  BID PRN.

## 2021-12-14 NOTE — H&P (Signed)
History and Physical    PLEASE NOTE THAT DRAGON DICTATION SOFTWARE WAS USED IN THE CONSTRUCTION OF THIS NOTE.   [name]:[id number] DOB: [birth year] DOA: 12/14/2021  PCP: [name], MD  Patient coming from: snf  I have personally briefly reviewed patient's old medical records in [place]  Chief Complaint: Shortness of breath  HPI: [name] is a 87 y.o. female with medical history significant for chronic diastolic heart failure, hypertension, hyperlipidemia, acquired hypothyroidism, allergic rhinitis, who is admitted to [facility] on 12/14/2021 with severe COVID-19 infection associated with acute hypoxic respiratory distress after presenting from [facility] to [facility] ED complaining of shortness of breath.   The patient reports 2 to 3 days of progressive shortness of breath associated with new onset nonproductive cough.  She also notes associated subjective fever in the absence of chills, full body rigors, or generalized myalgias.  No shortness of breath has not been associate with any orthopnea or PND.  She notes chronic edema in the bilateral lower extremities, without any recent associated worsening.  No recent chest pain, diaphoresis, palpitations, nausea, vomiting.  Denies any recent wheezing, hemoptysis, or calf tenderness.  No recent trauma or travel, nor any recent surgical procedures.  Denies any recent abdominal pain, diarrhea, rash, dysuria, gross hematuria.  Medical history notable for chronic diastolic heart failure, most recent echocardiogram in September 2020 notable for LVEF 60 to 65%, will demonstrating evidence of diastolic dysfunction and showing mild mitral regurgitation.  Patient reports compliance with home diuretic regimen consisting of Lasix 10 mg p.o. twice daily.  Denies any known chronic underlying pulmonary pathology and also denies any known baseline supplemental oxygen requirements.   ED Course:  Vital signs in the ED were notable for  the following: Afebrile; heart rate 82-93; blood pressure 131/67 -153/67; respiratory rate 19-23, initial oxygen saturation 88% on room air, with ensuing improvement to 95 to 97% on 2 L nasal cannula.  Labs were notable for the following: CMP notable for the following: Potassium 3.4, bicarbonate 22, creatinine 1.14, and liver enzymes were found to be within normal limits, including ALT 11.  BNP 442 compared to most recent prior value of 135 in March 2021.  High-sensitivity troponin I x2 found to be 24, with repeat value trending down to 23.  Lactic acid 1.5.  CBC notable for white cell count 8800.  Procalcitonin less than 0.10.  COVID-19 PCR found be positive in the ED today, will influenza A/B were negative.  Cultures x2 were collected.  Imaging and additional notable ED work-up: EKG showed sinus rhythm with PAC, heart rate 90, and no evidence of T wave or ST changes, including no evidence of ST elevation.  Chest x-ray showed cardiomegaly, with prominent central pulmonary vessels, but without evidence of interstitial/pulmonary edema, nor any evidence of infiltrate, pleural effusion, or pneumothorax.  While in the ED, the following were administered: (none); subsequently, patient was admitted to [facility] for further evaluation and management of severe [id number] infection complicated by acute hypoxic respiratory distress.     Review of Systems: As per HPI otherwise 10 point review of systems negative.   Past Medical History:  Diagnosis Date   Acute upper GI bleeding 03/12/2020   Arthritis    Depression    History of anemia    History of chicken pox    History of ulcer disease 1990s   HLD (hyperlipidemia)    HTN (hypertension)    Hypothyroidism    Osteoporosis 03/2016  at [place] -3.1 hip, -1.0 spine   Uses hearing aid     Past Surgical History:  Procedure Laterality Date   Bud   BIOPSY  03/13/2020   Procedure: BIOPSY;  Surgeon: Ladene Artist,  MD;  Location: [facility] ENDOSCOPY;  Service: Endoscopy;;   CARDIOVASCULAR STRESS TEST  2010   WNL   CARPAL TUNNEL RELEASE Left 04/28/2017   CATARACT EXTRACTION Bilateral 2012   COLONOSCOPY WITH PROPOFOL N/A 03/14/2020   single bleeding colonic angiectasia treated with APC Hilarie Fredrickson, Lajuan Lines, MD)   DEXA  03/2016   at [place] - T -3.1 hip, -1.0 spine   ESOPHAGOGASTRODUODENOSCOPY (EGD) WITH PROPOFOL N/A 03/13/2020   erosive gastropathy with stigmata of recent bleeding Ladene Artist, MD)   [place] HEMOSTASIS N/A 03/14/2020   Procedure: HOT HEMOSTASIS (ARGON PLASMA COAGULATION/BICAP);  Surgeon: Jerene Bears, MD;  Location: [facility] ENDOSCOPY;  Service: Gastroenterology;  Laterality: N/A;   KNEE SURGERY  1987   right   KNEE SURGERY  1998   left   KNEE SURGERY  2008   POLYPECTOMY  03/14/2020   Procedure: POLYPECTOMY;  Surgeon: Jerene Bears, MD;  Location: [facility] ENDOSCOPY;  Service: Gastroenterology;;   TONSILLECTOMY  1945   US ECHOCARDIOGRAPHY  2010   LVEF 65-75%, overall normal, mildly decreased LV diastolic compliance    Social History:  reports that she quit smoking about 28 years ago. Her smoking use included cigarettes. She has never used smokeless tobacco. She reports that she does not drink alcohol and does not use drugs.   No Known Allergies  Family History  Problem Relation Age of Onset   Cancer Mother 79       lung, nonsmoker   Cancer Father        lung, nonsmoker   Cancer Brother 22       lung, smoker   Cancer Sister 27       brain   Cancer Sister 17       lung, smoker   COPD Sister        smoker   Cancer Sister 25       colon   Hypertension Daughter    Diabetes Daughter    CAD Neg Hx    Stroke Neg Hx     Family history reviewed and not pertinent    Prior to Admission medications   Medication Sig Start Date End Date Taking? Authorizing Provider  acetaminophen (TYLENOL) 500 MG tablet Take 1,000 mg by mouth in the morning and at bedtime.     [provider]  calcium gluconate 500 MG tablet Take 1 tablet (500 mg total) by mouth daily. 04/14/21   [name], MD  cholecalciferol (VITAMIN D) 1000 UNITS tablet Take 1,000 Units by mouth daily.    [provider]  denosumab (PROLIA) 60 MG/ML SOSY injection Inject 60 mg into the skin every 6 (six) months. 12/06/19   [name], MD  diphenhydramine-acetaminophen (TYLENOL PM) 25-500 MG TABS Take 2 tablets by mouth at bedtime as needed (sleep).     [provider]  Docusate Sodium (STOOL SOFTENER LAXATIVE PO) Take 1 Dose by mouth in the morning and at bedtime.    [provider]  famotidine (PEPCID) 20 MG tablet Take 20 mg by mouth daily.    [provider]  FLUoxetine (PROZAC) 40 MG capsule TAKE 1 CAPSULE EVERY DAY  (REPLACES  TABLETS)  08/16/21   [name], MD  furosemide (LASIX) 20 MG tablet Take 0.5 tablets (10 mg total) by mouth 2 (two) times daily. 08/28/21   [name], MD  hydrALAZINE (APRESOLINE) 25 MG tablet Take 1 tablet (25 mg total) by mouth in the morning and at bedtime. 09/11/21   [name], MD  hydrOXYzine (ATARAX/VISTARIL) 10 MG tablet Take 0.5-1 tablets (5-10 mg total) by mouth 2 (two) times daily as needed for anxiety. 05/14/21   [name], MD  levothyroxine (SYNTHROID) 88 MCG tablet TAKE 1 TABLET EVERY DAY BEFORE BREAKFAST 08/16/21   [name], MD  lisinopril (ZESTRIL) 40 MG tablet TAKE 1 TABLET EVERY DAY (NEW DOSE) 11/02/21   [name], MD  loratadine (CLARITIN) 10 MG tablet TAKE 1 TABLET EVERY EVENING 06/13/21   [name], MD  metoprolol succinate (TOPROL-XL) 25 MG 24 hr tablet Take 1 tablet (25 mg total) by mouth daily. 10/02/21   [name], MD  mirabegron ER [facility]) 25 MG TB24 tablet TAKE 1 TABLET EVERY DAY 08/16/21   [name], MD  omeprazole (PRILOSEC) 40 MG capsule TAKE 1 CAPSULE EVERY DAY 08/16/21   [name], MD  pravastatin (PRAVACHOL) 40 MG tablet  TAKE 1 TABLET EVERY DAY 08/23/21   [name], MD  verapamil (CALAN-SR) 240 MG CR tablet Take 1 tablet (240 mg total) by mouth daily. 09/14/21   [name], MD  vitamin B-12 (CYANOCOBALAMIN) 500 MCG tablet Take 1 tablet (500 mcg total) by mouth once a week. 03/08/20   [name], MD     Objective    Physical Exam: Vitals:   12/14/21 1930 12/14/21 1945 12/14/21 2000 12/14/21 2030  BP: (!) 127/58 (!) 116/45 134/68 131/67  Pulse: 86 82 93 92  Resp: 20 19 (!) 21 20  Temp:      TempSrc:      SpO2: 95% 95% 97% 97%    General: appears to be stated age; alert, oriented; increased work of breathing noted Skin: warm, dry, no rash Head:  AT/[place] Mouth:  Oral mucosa membranes appear moist, normal dentition Neck: supple; trachea midline Heart:  RRR; did not appreciate any M/R/G Lungs: CTAB, did not appreciate any wheezes, rales, or rhonchi Abdomen: + BS; soft, ND, NT Vascular: 2+ pedal pulses b/l; 2+ radial pulses b/l Extremities: 1-2+ edema in b/l LE's; mild erythema a/w rle, which patient reports is chronic. no muscle wasting Neuro: strength and sensation intact in upper and lower extremities b/l     Labs on Admission: I have personally reviewed following labs and imaging studies  CBC: Recent Labs  Lab 12/14/21 1703  WBC 8.8  NEUTROABS 6.2  HGB 11.8*  HCT 38.3  MCV 93.0  PLT 818   Basic Metabolic Panel: Recent Labs  Lab 12/14/21 1703  NA 135  K 3.4*  CL 104  CO2 22  GLUCOSE 137*  BUN 13  CREATININE 1.14*  CALCIUM 9.7   GFR: CrCl cannot be calculated (Unknown ideal weight.). Liver Function Tests: Recent Labs  Lab 12/14/21 1703  AST 16  ALT 11  ALKPHOS 30*  BILITOT 0.7  PROT 7.1  ALBUMIN 3.8   No results for input(s): LIPASE, AMYLASE in the last 168 hours. No results for input(s): AMMONIA in the last 168 hours. Coagulation Profile: No results for input(s): INR, PROTIME in the last 168 hours. Cardiac Enzymes: No results for input(s):  CKTOTAL, CKMB, CKMBINDEX, TROPONINI in the last 168 hours. BNP (last 3 results) Recent Labs    08/17/21 1249  PROBNP 260.0*   HbA1C: No results for input(s): HGBA1C in the last 72 hours. CBG: No results for input(s): GLUCAP in the last 168 hours. Lipid Profile: Recent Labs    12/14/21 1703  TRIG 82   Thyroid Function Tests: No results for input(s): TSH, T4TOTAL, FREET4, T3FREE, THYROIDAB in the last 72 hours. Anemia Panel: Recent Labs    12/14/21 1703  FERRITIN 81   Urine analysis:    Component Value Date/Time   COLORURINE YELLOW 07/13/2009 1040   APPEARANCEUR CLOUDY (A) 07/13/2009 1040   LABSPEC 1.026 07/13/2009 1040   PHURINE 5.5 07/13/2009 1040   GLUCOSEU NEGATIVE 07/13/2009 1040   HGBUR NEGATIVE 07/13/2009 1040   BILIRUBINUR negative 09/01/2019 1317   KETONESUR NEGATIVE 07/13/2009 1040   PROTEINUR Negative 09/01/2019 1317   PROTEINUR NEGATIVE 07/13/2009 1040   UROBILINOGEN 0.2 09/01/2019 1317   UROBILINOGEN 0.2 07/13/2009 1040   NITRITE negative 09/01/2019 1317   NITRITE NEGATIVE 07/13/2009 1040   LEUKOCYTESUR Small (1+) (A) 09/01/2019 1317    Radiological Exams on Admission: DG Chest Port 1 View  Result Date: 12/14/2021 CLINICAL DATA:  Shortness of breath EXAM: PORTABLE CHEST 1 VIEW COMPARISON:  03/12/2020 FINDINGS: Transverse diameter of heart is increased. There are no signs of alveolar pulmonary edema. Central pulmonary arteries are prominent, possibly suggesting pulmonary arterial hypertension. There are no signs of focal pulmonary consolidation. Left hemidiaphragm is elevated. There is no significant pleural effusion or pneumothorax. IMPRESSION: Cardiomegaly. Central pulmonary vessels are prominent without signs of alveolar pulmonary edema. There is no focal pulmonary consolidation. Electronically Signed   By: Elmer Picker M.D.   On: 12/14/2021 17:36     EKG: Independently reviewed, with result as described above.    Assessment/Plan     Principal Problem:   COVID-19 virus infection Active Problems:   Hypothyroidism   Hyperlipidemia   Hypertension   Chronic diastolic CHF (congestive heart failure) (HCC)   SOB (shortness of breath)   Hypokalemia   Hypomagnesemia   Allergic rhinitis     #) Severe COVID-19 infection: diagnosis on the basis of: 2 to 3 days of progressive shortness of breath associated new onset nonproductive cough, with outpatient COVID-19 test positive on 12/14/2021, with confirmatory positive COVID-19 PCR performed in the ED this evening. Additionally, in the context of no known baseline supplemental O2 requirements, initial O2 sat in the high 80s on room air, subsequently improving to 95 to 97% on 2 L nasal cannula. In setting of this acute hypoxia, criteria are met from patient's COVID-19 infection to be considered severe in nature. Consequently, will initiate solumedrol.   Of note, in the setting of of the patient's age greater than 34 as well as multiple comorbidities, including essential hypertension, patient meets criteria to be considered high risk for a more complicated clinical course of COVID-19 infection. Therefore, in the setting of symptomatic COVID-19 infection requiring hospitalization for further evaluation and management thereof in this patient with the aforementioned high risk criteria, with respiratory symptoms starting less than 5 days ago, indications are met for initiation of remdesivir per treatment guidance recommendations from [place]'s Covid Treatment Guidelines. Of note, ALT found to be less than 220. Therefore, there is no contraindication for initiation of remdesivir on the basis of transaminitis.   Will closely monitor ensuing degree of hypoxia as well as associated trend in supplemental oxygen requirements. Does not appear to me indications for initiation of Tocilizumab at this time, as further described below. No known chronic underlying pulmonary pathology, and no  h/o  diabetes.   Of note, chest x-ray shows no evidence of chronic vascular pulmonary vessels, without evidence of interstitial/pulmonary edema, nor any additional evidence of acute cardiopulmonary process.  Furthermore, procalcitonin was found to be non-elevated, which, in the context of the pro-inflammatory state associated with COVID-19, provides a high degree of negative predictive value against the likelihood of any contribution from bacterial pneumonia.    Plan: Airborne and contact precautions. Monitor continuous pulse oximetry and monitor on telemetry. prn supplemental O2 to maintain O2 sats greater than or equal to 94%. Proning protocol initiated. PRN albuterol inhaler. PRN acetaminophen for fever. Start solumedrol and remdesivir, as above. Check and trend inflammatory markers (namely d dimer, CRP). Check serum magnesium and phosphorus levels. Check CMP /CBC in AM. Check blood gas.  Flutter valve and incentive spirometry.          #) Acute hypoxic respiratory distress: in the context of no known baseline supplemental oxygen requirements, presenting O2 sat noted to be 88% on RA, subsequently improving to 95 to 97% on 2 L nasal cannula, thereby, in context of presenting shortness of breath, meets criteria for acute hypoxic respiratory distress as opposed to acute hypoxic respiratory failure at this time. Appears to be on the basis of COVID-19 infection, as above. No known chronic underlying pulmonary conditions. ACS is felt to be less likely at this time in the absence of any recent chest pain and in the context of presenting EKG showing no evidence of acute ischemic process.  Very mild acute on chronic diastolic heart failure is possible given chest x-ray showing evidence of prominent central pulmonary vasculature, but without any evidence of interstitial/pulmonary edema nor any evidence of pleural effusion or infiltrate.  Additionally, BNP slightly elevated relative to most recent prior. Will  continue home diuretic regimen, with consideration for a dose of IV Lasix dependent upon ensuing clinical trend. Bacterial pneumonia unlikely given non-elevated procalcitonin, as above.    Plan: further evaluation and management of presenting COVID-19 infection, as above, including monitoring of continuous pulse oximetry with prn supplemental O2 to maintain O2 sats greater than or equal to 94%. monitor on telemetry. Trending of inflammatory markers, as above. Check CMP and CBC in the morning. Check serum Mg and Phos levels. Check blood gas.  Flutter valve and incentive spirometry. PRN albuterol inhaler. solumedrol and remdesivir, as above.        #) Hypokalemia: Presenting serum potassium 3.4.  Of note, serum magnesium also slightly low warranting independent supplementation, as below.  Plan: Potassium chloride 40 mill colons p.o. x1 dose now.  Magnesium supplementation, as below.  Repeat BMP in the morning.     #) Hypomagnesemia: Serum magnesium level found to be 1.6.  Particularly in the context of concomitant mild hypokalemia, will provide IV magnesium supplementation, as below.  Plan: Magnesium sulfate 2 g IV over 2 hours x 1 dose now.  Repeat serum magnesium level in the morning.          #) Chronic diastolic heart failure: documented history of such, with most recent echocardiogram performed in September 2020 notable for LVEF 60 to 65% as well as demonstrating evidence of diastolic dysfunction.  Potential very mild exacerbation of chronic diastolic heart failure, although this is not overwhelming.  Will resume home oral Lasix regimen, and closely monitor ensuing volume status and clinical trend from a respiratory standpoint, as further detailed above.    Plan: monitor strict I's & O's and daily weights. Repeat BMP in AM. Continue home  diuretic regimen.  Supplementation of mild hypokalemia and mild hypomagnesemia, as above.  Repeat serum magnesium level in the morning.  Monitor  continuous pulse oximetry.  Continue home Toprol-XL.       #) Depression: Documented history of such, on fluoxetine as an outpatient.  No evidence of QTC prolongation on presenting EKG.  Plan: Continue home fluoxetine.       #) Essential Hypertension: documented h/o such, with outpatient antihypertensive regimen including Lasix, hydralazine, lisinopril, Toprol-XL, verapamil.  SBP's in the ED today: In the 130s to 150smmHg.  Context of presenting infectious etiology, with the slightly conservative resumption of home antihypertensive medications, as detailed below.   Plan: Close monitoring of subsequent BP via routine VS. resume home Lasix and metoprolol succinate.  We will hold home hydralazine, lisinopril, and verapamil for now.  BMP in the a.m.          #) Hyperlipidemia: documented h/o such. On pravastatin as outpatient.    Plan: continue home statin.         #) acquired hypothyroidism: documented h/o such, on Synthroid as outpatient.   Plan: cont home Synthroid.         #) GERD: documented h/o such; on omeprazole and Pepcid as outpatient.   Plan: continue home PPI and H2 blocker.        #) Allergic rhinitis: Documented history of such, on Claritin as an outpatient.  Of note, does not appear to be on first-line intervention of intranasal corticosteroid.  Plan: Continue home Claritin.      DVT prophylaxis: SCD's   Code Status: Full code Family Communication: none Disposition Plan: Per Rounding Team Consults called: none;  Admission status: inpatient;   Warrants inpatient status on basis of further evaluation management of severe COVID-19 infection associated with acute hypoxic respiratory distress in the absence of any known baseline supplemental oxygen requirements, including provision of IV remdesivir and systemic corticosteroids in the setting of associated severity and duration of symptoms, as above, along with need for IV supplementation  of hypomagnesemia, particularly given presenting respiratory complaints.   PLEASE NOTE THAT DRAGON DICTATION SOFTWARE WAS USED IN THE CONSTRUCTION OF THIS NOTE.   Gem DO [facility] [place]   12/14/2021, 9:38 PM

## 2021-12-14 NOTE — Telephone Encounter (Signed)
Spoke with pt's daughter, [name] (on dpr) offering video visit at 4:00.  She agrees.  I will have pt added to schedule. Fyi to Dr. Darnell Level.

## 2021-12-14 NOTE — Telephone Encounter (Signed)
Noted. Will await ER evaluation.  

## 2021-12-14 NOTE — Telephone Encounter (Signed)
Called pt to get her ready for 4:00 MyChart visit.  However, someone named Andee Poles answered the phone and informed me EMS was called for pt due to her breathing being so bad.  They were there at the time of call so Andee Poles could not provide anymore info.  I asked that she call back with an update on pt.  Fyi to Dr. Darnell Level.

## 2021-12-14 NOTE — ED Triage Notes (Signed)
Pt arrived via EMS, from home, COVID +, tested home test. Cough fever, sore throat, SOB started x2 days. 90% RA RR 40s upon arrival   125 solumedrol 5 albuterol 0.5 atrovent

## 2021-12-14 NOTE — ED Notes (Signed)
Pt provided with [place] sandwich as well as a cup of water on her request.

## 2021-12-14 NOTE — ED Notes (Addendum)
Granddaughter, Ignatius Specking, [place], wants an updated. Granddaughter said she is a Pharmacologist and works at [employer] and [employer].

## 2021-12-14 NOTE — Telephone Encounter (Signed)
Pt daughter called stating that pt tested positive for covid. There is no available appt today. Pt daughter states that pt is wheezing sore throat headache and can not sleep well.Pt daughter is asking is there something you can call in especially for her wheezing. Please advise.Pt daughter states that you can call her at [phone].

## 2021-12-14 NOTE — Progress Notes (Signed)
Visit cancelled as pt was taken to [facility] ER for evaluation

## 2021-12-15 ENCOUNTER — Encounter (HOSPITAL_COMMUNITY): Payer: Self-pay | Admitting: Internal Medicine

## 2021-12-15 DIAGNOSIS — E876 Hypokalemia: Secondary | ICD-10-CM | POA: Diagnosis present

## 2021-12-15 DIAGNOSIS — J309 Allergic rhinitis, unspecified: Secondary | ICD-10-CM | POA: Diagnosis present

## 2021-12-15 DIAGNOSIS — R0602 Shortness of breath: Secondary | ICD-10-CM | POA: Diagnosis present

## 2021-12-15 LAB — BASIC METABOLIC PANEL
Anion gap: 8 (ref 5–15)
BUN: 20 mg/dL (ref 8–23)
CO2: 25 mmol/L (ref 22–32)
Calcium: 9.5 mg/dL (ref 8.9–10.3)
Chloride: 103 mmol/L (ref 98–111)
Creatinine, Ser: 1.17 mg/dL — ABNORMAL HIGH (ref 0.44–1.00)
GFR, Estimated: 45 mL/min — ABNORMAL LOW (ref >60)
Glucose, Bld: 104 mg/dL — ABNORMAL HIGH (ref 70–99)
Potassium: 4.2 mmol/L (ref 3.5–5.1)
Sodium: 136 mmol/L (ref 135–145)

## 2021-12-15 LAB — BLOOD GAS, VENOUS
Acid-Base Excess: 1.2 mmol/L (ref 0.0–2.0)
Bicarbonate: 26.9 mmol/L (ref 20.0–28.0)
O2 Saturation: 77.6 %
pCO2, Ven: 50.6 mmHg (ref 44.0–60.0)
pH, Ven: 7.346 (ref 7.250–7.430)
pO2, Ven: 44.1 mmHg (ref 32.0–45.0)

## 2021-12-15 LAB — D-DIMER, QUANTITATIVE: D-Dimer, Quant: 0.97 ug/mL-FEU — ABNORMAL HIGH (ref 0.00–0.50)

## 2021-12-15 LAB — CBC WITH DIFFERENTIAL/PLATELET
Abs Immature Granulocytes: 0.05 10*3/uL (ref 0.00–0.07)
Basophils Absolute: 0 10*3/uL (ref 0.0–0.1)
Basophils Relative: 0 %
Eosinophils Absolute: 0 10*3/uL (ref 0.0–0.5)
Eosinophils Relative: 0 %
HCT: 36.2 % (ref 36.0–46.0)
Hemoglobin: 11 g/dL — ABNORMAL LOW (ref 12.0–15.0)
Immature Granulocytes: 1 %
Lymphocytes Relative: 4 %
Lymphs Abs: 0.3 10*3/uL — ABNORMAL LOW (ref 0.7–4.0)
MCH: 28.3 pg (ref 26.0–34.0)
MCHC: 30.4 g/dL (ref 30.0–36.0)
MCV: 93.1 fL (ref 80.0–100.0)
Monocytes Absolute: 0.1 10*3/uL (ref 0.1–1.0)
Monocytes Relative: 1 %
Neutro Abs: 6.2 10*3/uL (ref 1.7–7.7)
Neutrophils Relative %: 94 %
Platelets: 163 10*3/uL (ref 150–400)
RBC: 3.89 MIL/uL (ref 3.87–5.11)
RDW: 13.7 % (ref 11.5–15.5)
WBC: 6.6 10*3/uL (ref 4.0–10.5)
nRBC: 0 % (ref 0.0–0.2)

## 2021-12-15 LAB — COMPREHENSIVE METABOLIC PANEL
ALT: 10 U/L (ref 0–44)
AST: 15 U/L (ref 15–41)
Albumin: 3.5 g/dL (ref 3.5–5.0)
Alkaline Phosphatase: 28 U/L — ABNORMAL LOW (ref 38–126)
Anion gap: 9 (ref 5–15)
BUN: 15 mg/dL (ref 8–23)
CO2: 26 mmol/L (ref 22–32)
Calcium: 9.6 mg/dL (ref 8.9–10.3)
Chloride: 102 mmol/L (ref 98–111)
Creatinine, Ser: 1.28 mg/dL — ABNORMAL HIGH (ref 0.44–1.00)
GFR, Estimated: 40 mL/min — ABNORMAL LOW (ref >60)
Glucose, Bld: 156 mg/dL — ABNORMAL HIGH (ref 70–99)
Potassium: 4.3 mmol/L (ref 3.5–5.1)
Sodium: 137 mmol/L (ref 135–145)
Total Bilirubin: 0.5 mg/dL (ref 0.3–1.2)
Total Protein: 6.9 g/dL (ref 6.5–8.1)

## 2021-12-15 LAB — PHOSPHORUS: Phosphorus: 3.4 mg/dL (ref 2.5–4.6)

## 2021-12-15 LAB — C-REACTIVE PROTEIN: CRP: 4.8 mg/dL — ABNORMAL HIGH (ref <1.0)

## 2021-12-15 LAB — MAGNESIUM: Magnesium: 2.2 mg/dL (ref 1.7–2.4)

## 2021-12-15 MED ORDER — PANTOPRAZOLE SODIUM 40 MG PO TBEC
40.0000 mg | DELAYED_RELEASE_TABLET | Freq: Every day | ORAL | Status: DC
  Administered 2021-12-15 – 2021-12-18 (×4): 40 mg via ORAL
  Filled 2021-12-15 (×4): qty 1

## 2021-12-15 MED ORDER — SODIUM CHLORIDE 0.9 % IV SOLN
100.0000 mg | INTRAVENOUS | Status: AC
  Administered 2021-12-15 (×2): 100 mg via INTRAVENOUS
  Filled 2021-12-15 (×2): qty 20

## 2021-12-15 MED ORDER — LORATADINE 10 MG PO TABS
10.0000 mg | ORAL_TABLET | Freq: Every day | ORAL | Status: DC
  Administered 2021-12-15 – 2021-12-18 (×4): 10 mg via ORAL
  Filled 2021-12-15 (×4): qty 1

## 2021-12-15 MED ORDER — BENZONATATE 100 MG PO CAPS
200.0000 mg | ORAL_CAPSULE | Freq: Three times a day (TID) | ORAL | Status: AC
  Administered 2021-12-15 – 2021-12-17 (×8): 200 mg via ORAL
  Filled 2021-12-15 (×8): qty 2

## 2021-12-15 MED ORDER — SODIUM CHLORIDE 0.9 % IV SOLN
100.0000 mg | Freq: Every day | INTRAVENOUS | Status: DC
  Administered 2021-12-16 – 2021-12-17 (×2): 100 mg via INTRAVENOUS
  Filled 2021-12-15 (×2): qty 20

## 2021-12-15 MED ORDER — FUROSEMIDE 20 MG PO TABS
10.0000 mg | ORAL_TABLET | Freq: Two times a day (BID) | ORAL | Status: DC
  Administered 2021-12-15 – 2021-12-16 (×4): 10 mg via ORAL
  Filled 2021-12-15: qty 1
  Filled 2021-12-15 (×2): qty 0.5
  Filled 2021-12-15: qty 1
  Filled 2021-12-15: qty 0.5
  Filled 2021-12-15: qty 1

## 2021-12-15 MED ORDER — LEVOTHYROXINE SODIUM 88 MCG PO TABS
88.0000 ug | ORAL_TABLET | Freq: Every day | ORAL | Status: DC
  Administered 2021-12-15 – 2021-12-18 (×4): 88 ug via ORAL
  Filled 2021-12-15 (×5): qty 1

## 2021-12-15 MED ORDER — ZINC SULFATE 220 (50 ZN) MG PO CAPS
220.0000 mg | ORAL_CAPSULE | Freq: Every day | ORAL | Status: DC
  Administered 2021-12-15 – 2021-12-18 (×4): 220 mg via ORAL
  Filled 2021-12-15 (×4): qty 1

## 2021-12-15 MED ORDER — PRAVASTATIN SODIUM 40 MG PO TABS
40.0000 mg | ORAL_TABLET | Freq: Every day | ORAL | Status: DC
  Administered 2021-12-15 – 2021-12-18 (×4): 40 mg via ORAL
  Filled 2021-12-15: qty 1
  Filled 2021-12-15: qty 2
  Filled 2021-12-15 (×2): qty 1

## 2021-12-15 MED ORDER — METOPROLOL SUCCINATE ER 25 MG PO TB24
25.0000 mg | ORAL_TABLET | Freq: Every day | ORAL | Status: DC
  Administered 2021-12-15 – 2021-12-18 (×4): 25 mg via ORAL
  Filled 2021-12-15 (×4): qty 1

## 2021-12-15 MED ORDER — ENOXAPARIN SODIUM 40 MG/0.4ML IJ SOSY
40.0000 mg | PREFILLED_SYRINGE | Freq: Every day | INTRAMUSCULAR | Status: DC
  Administered 2021-12-15 – 2021-12-18 (×4): 40 mg via SUBCUTANEOUS
  Filled 2021-12-15 (×4): qty 0.4

## 2021-12-15 MED ORDER — ASCORBIC ACID 500 MG PO TABS
500.0000 mg | ORAL_TABLET | Freq: Every day | ORAL | Status: DC
  Administered 2021-12-15 – 2021-12-18 (×4): 500 mg via ORAL
  Filled 2021-12-15 (×4): qty 1

## 2021-12-15 MED ORDER — FLUOXETINE HCL 20 MG PO CAPS
40.0000 mg | ORAL_CAPSULE | Freq: Every day | ORAL | Status: DC
  Administered 2021-12-15 – 2021-12-18 (×4): 40 mg via ORAL
  Filled 2021-12-15 (×4): qty 2

## 2021-12-15 MED ORDER — MAGNESIUM SULFATE 2 GM/50ML IV SOLN
2.0000 g | Freq: Once | INTRAVENOUS | Status: AC
  Administered 2021-12-15: 2 g via INTRAVENOUS
  Filled 2021-12-15: qty 50

## 2021-12-15 MED ORDER — VITAMIN D 25 MCG (1000 UNIT) PO TABS
1000.0000 [IU] | ORAL_TABLET | Freq: Every day | ORAL | Status: DC
Start: 2021-12-15 — End: 2021-12-18
  Administered 2021-12-15 – 2021-12-18 (×4): 1000 [IU] via ORAL
  Filled 2021-12-15 (×4): qty 1

## 2021-12-15 MED ORDER — FUROSEMIDE 10 MG/ML IJ SOLN
20.0000 mg | Freq: Once | INTRAMUSCULAR | Status: AC
  Administered 2021-12-15: 20 mg via INTRAVENOUS
  Filled 2021-12-15: qty 4

## 2021-12-15 MED ORDER — IPRATROPIUM-ALBUTEROL 20-100 MCG/ACT IN AERS
1.0000 | INHALATION_SPRAY | Freq: Four times a day (QID) | RESPIRATORY_TRACT | Status: DC
  Administered 2021-12-16 – 2021-12-18 (×9): 1 via RESPIRATORY_TRACT
  Filled 2021-12-15 (×2): qty 4

## 2021-12-15 MED ORDER — FAMOTIDINE 20 MG PO TABS
20.0000 mg | ORAL_TABLET | Freq: Every day | ORAL | Status: DC
  Administered 2021-12-15 – 2021-12-18 (×4): 20 mg via ORAL
  Filled 2021-12-15 (×4): qty 1

## 2021-12-15 NOTE — ED Notes (Signed)
Ramon Dredge, granddaughter, [phone]

## 2021-12-15 NOTE — Progress Notes (Signed)
PROGRESS NOTE  [name] HCW:[id number] DOB: 1930/12/29 DOA: 12/14/2021 PCP: [name], MD  HPI/Recap of past 24 hours: [name] is a 87 y.o. female with medical history significant for chronic diastolic CHF, hypertension, hyperlipidemia, acquired hypothyroidism, allergic rhinitis, who presented to [facility] ED from SNF due to progressively worsening shortness of breath of 2 to 3 days duration.  Associated with a nonproductive cough.  Work-up revealed COVID-19 viral infection, acute hypoxic respiratory failure with O2 saturation in the 80s on room air at presentation, volume overload with a BNP of greater than 400.  She was started on antiviral, IV remdesivir, and IV Solu-Medrol.  Her home oral diuretic Lasix at 10 mg twice daily was resumed.  12/15/2021: Patient was seen and examined at her bedside in the ED.  Persistent nonproductive cough noted on exam.  Mild wheezing noted on lungs auscultation bilaterally.  2+ pitting edema in lower extremities bilaterally.  1 dose IV Lasix 20 mg ordered x1.  Her creatinine was uptrending this morning, will repeat BMP at 1600 after dose of IV Lasix to reassess renal function.    Assessment/Plan: Principal Problem:   COVID-19 virus infection Active Problems:   Hypothyroidism   Hyperlipidemia   Hypertension   Chronic diastolic CHF (congestive heart failure) (HCC)   SOB (shortness of breath)   Hypokalemia   Hypomagnesemia   Allergic rhinitis  COVID-19 viral infection Personally reviewed chest x-ray done on admission which showed cardiomegaly with mild increase in pulmonary vascularity, no focal infiltrates. Started on antiviral, IV remdesivir, continue Continue IV Solu-Medrol started in the ED. Start vitamin C, D3 and zinc. Start Combivent inhaler every 6 hours Antitussives, Tessalon Perles Incentive spirometer Flutter valve Mobilize as tolerated  Acute hypoxic respiratory failure secondary to COVID-19 viral infection versus mild  pulmonary edema Not on oxygen supplementation at baseline Currently on 2 L to maintain O2 saturation greater 92% Wean off oxygen supplementation as tolerated Continue to treat underlying condition Continue IV remdesivir Continue diuresing as tolerated Bronchodilators Pulmonary toilet  Acute on chronic diastolic CHF Presented with gradually worsening dyspnea Mild pulmonary edema seen on chest x-ray 2+ pitting edema in lower extremities bilaterally BNP greater than 400 Last 2D echo done on 09/10/2019 showed LVEF 60 to 65% with severe asymmetric left ventricular hypertrophy of the basal septal wall.  The right ventricle has mildly reduced systolic function. Start strict I's and O's and daily weight Obtain limited 2D echo  Chronic normocytic anemia/anemia of chronic disease Hemoglobin 11.0 No overt bleeding Continue to monitor  CKD 3B Baseline creatinine appears to be 1.14 with GFR 46 Creatinine uptrending this morning 1.28 Received a dose of IV Lasix 20 mg x 1 Monitor urine output with strict I's and O's Avoid nephrotoxic agents and hypotension Repeat BMP at 1600 to reassess renal function after IV Lasix  Hypothyroidism Resume home levothyroxine  GERD Resume home Protonix and Pepcid  Hyperlipidemia Resume home pravastatin  Chronic anxiety/depression Resume home Prozac   Code Status: Full code  Family Communication: None at bedside  Disposition Plan: Likely will discharge back to SNF possibly on 12/17/2021 oh 12/18/2021.   Consultants: None.  Procedures: Limited 2D echo 12/15/2021  Antimicrobials: Antiviral IV remdesivir.  DVT prophylaxis: Subcu Lovenox daily  Status is: Inpatient  Patient requires at least 2 midnights for further evaluation and treatment of present condition.      Objective: Vitals:   12/15/21 0430 12/15/21 0530 12/15/21 0600 12/15/21 0630  BP: (!) 159/67 (!) 164/62 (!) 163/67 Marland Kitchen)  156/66  Pulse: 67 65 80 79  Resp: 20 19 18 19    Temp:      TempSrc:      SpO2: 98% 99% 98% 98%    Intake/Output Summary (Last 24 hours) at 12/15/2021 1023 Last data filed at 12/15/2021 0225 Gross per 24 hour  Intake 250 ml  Output --  Net 250 ml   There were no vitals filed for this visit.  Exam:  General: 87 y.o. year-old female well developed well nourished in no acute distress.  Alert and interactive. Cardiovascular: Regular rate and rhythm with no rubs or gallops.  No thyromegaly or JVD noted.   Respiratory: Diffuse wheezing bilaterally.  Mild rales at bases.  Poor inspiratory effort. Abdomen: Soft nontender nondistended with normal bowel sounds x4 quadrants. Musculoskeletal: 2+ pitting edema in lower extremities bilaterally.   Skin: No ulcerative lesions noted or rashes Psychiatry: Mood is appropriate for condition and setting   Data Reviewed: CBC: Recent Labs  Lab 12/14/21 1703 12/15/21 0240  WBC 8.8 6.6  NEUTROABS 6.2 6.2  HGB 11.8* 11.0*  HCT 38.3 36.2  MCV 93.0 93.1  PLT 178 557   Basic Metabolic Panel: Recent Labs  Lab 12/14/21 1703 12/14/21 1845 12/15/21 0240  NA 135  --  137  K 3.4*  --  4.3  CL 104  --  102  CO2 22  --  26  GLUCOSE 137*  --  156*  BUN 13  --  15  CREATININE 1.14*  --  1.28*  CALCIUM 9.7  --  9.6  MG  --  1.6* 2.2  PHOS  --   --  3.4   GFR: CrCl cannot be calculated (Unknown ideal weight.). Liver Function Tests: Recent Labs  Lab 12/14/21 1703 12/15/21 0240  AST 16 15  ALT 11 10  ALKPHOS 30* 28*  BILITOT 0.7 0.5  PROT 7.1 6.9  ALBUMIN 3.8 3.5   No results for input(s): LIPASE, AMYLASE in the last 168 hours. No results for input(s): AMMONIA in the last 168 hours. Coagulation Profile: No results for input(s): INR, PROTIME in the last 168 hours. Cardiac Enzymes: No results for input(s): CKTOTAL, CKMB, CKMBINDEX, TROPONINI in the last 168 hours. BNP (last 3 results) Recent Labs    08/17/21 1249  PROBNP 260.0*   HbA1C: No results for input(s): HGBA1C in the  last 72 hours. CBG: No results for input(s): GLUCAP in the last 168 hours. Lipid Profile: Recent Labs    12/14/21 1703  TRIG 82   Thyroid Function Tests: No results for input(s): TSH, T4TOTAL, FREET4, T3FREE, THYROIDAB in the last 72 hours. Anemia Panel: Recent Labs    12/14/21 1703  FERRITIN 81   Urine analysis:    Component Value Date/Time   COLORURINE YELLOW 07/13/2009 1040   APPEARANCEUR CLOUDY (A) 07/13/2009 1040   LABSPEC 1.026 07/13/2009 1040   PHURINE 5.5 07/13/2009 1040   GLUCOSEU NEGATIVE 07/13/2009 1040   HGBUR NEGATIVE 07/13/2009 1040   BILIRUBINUR negative 09/01/2019 1317   KETONESUR NEGATIVE 07/13/2009 1040   PROTEINUR Negative 09/01/2019 1317   PROTEINUR NEGATIVE 07/13/2009 1040   UROBILINOGEN 0.2 09/01/2019 1317   UROBILINOGEN 0.2 07/13/2009 1040   NITRITE negative 09/01/2019 1317   NITRITE NEGATIVE 07/13/2009 1040   LEUKOCYTESUR Small (1+) (A) 09/01/2019 1317   Sepsis Labs: @LABRCNTIP (procalcitonin:4,lacticidven:4)  ) Recent Results (from the past 240 hour(s))  Resp Panel by RT-PCR (Flu A&B, Covid) Nasopharyngeal Swab     Status: Abnormal   Collection Time: 12/14/21  5:03 PM   Specimen: Nasopharyngeal Swab; Nasopharyngeal(NP) swabs in vial transport medium  Result Value Ref Range Status   SARS Coronavirus 2 by RT PCR POSITIVE (A) NEGATIVE Final    Comment: RESULT CALLED TO, READ BACK BY AND VERIFIED WITH: ELLWANGER,A. 12/14/21 @2154  BY SEEL,M. (NOTE) SARS-CoV-2 target nucleic acids are DETECTED.  The SARS-CoV-2 RNA is generally detectable in upper respiratory specimens during the acute phase of infection. Positive results are indicative of the presence of the identified virus, but do not rule out bacterial infection or co-infection with other pathogens not detected by the test. Clinical correlation with patient history and other diagnostic information is necessary to determine patient infection status. The expected result is Negative.  Fact  Sheet for Patients: EntrepreneurPulse.com.au  Fact Sheet for Healthcare Providers: IncredibleEmployment.be  This test is not yet approved or cleared by the [place] FDA and  has been authorized for detection and/or diagnosis of SARS-CoV-2 by FDA under an Emergency Use Authorization (EUA).  This EUA will remain in effect (meaning this test c an be used) for the duration of  the COVID-19 declaration under Section 564(b)(1) of the Act, 21 U.S.C. section 360bbb-3(b)(1), unless the authorization is terminated or revoked sooner.     Influenza A by PCR NEGATIVE NEGATIVE Final   Influenza B by PCR NEGATIVE NEGATIVE Final    Comment: (NOTE) The Xpert Xpress SARS-CoV-2/FLU/RSV plus assay is intended as an aid in the diagnosis of influenza from Nasopharyngeal swab specimens and should not be used as a sole basis for treatment. Nasal washings and aspirates are unacceptable for Xpert Xpress SARS-CoV-2/FLU/RSV testing.  Fact Sheet for Patients: EntrepreneurPulse.com.au  Fact Sheet for Healthcare Providers: IncredibleEmployment.be  This test is not yet approved or cleared by the [place] FDA and has been authorized for detection and/or diagnosis of SARS-CoV-2 by FDA under an Emergency Use Authorization (EUA). This EUA will remain in effect (meaning this test can be used) for the duration of the COVID-19 declaration under Section 564(b)(1) of the Act, 21 U.S.C. section 360bbb-3(b)(1), unless the authorization is terminated or revoked.  Performed at [facility], [place] [address] [place], [place] [address]       Studies: DG Chest Port 1 View  Result Date: 12/14/2021 CLINICAL DATA:  Shortness of breath EXAM: PORTABLE CHEST 1 VIEW COMPARISON:  03/12/2020 FINDINGS: Transverse diameter of heart is increased. There are no signs of alveolar pulmonary edema. Central pulmonary arteries are  prominent, possibly suggesting pulmonary arterial hypertension. There are no signs of focal pulmonary consolidation. Left hemidiaphragm is elevated. There is no significant pleural effusion or pneumothorax. IMPRESSION: Cardiomegaly. Central pulmonary vessels are prominent without signs of alveolar pulmonary edema. There is no focal pulmonary consolidation. Electronically Signed   By: Elmer Picker M.D.   On: 12/14/2021 17:36    Scheduled Meds:  famotidine  20 mg Oral Daily   FLUoxetine  40 mg Oral Daily   furosemide  10 mg Oral BID   levothyroxine  88 mcg Oral Q0600   loratadine  10 mg Oral Daily   methylPREDNISolone (SOLU-MEDROL) injection  100 mg Intravenous QHS   Followed by   Derrill Memo ON 12/17/2021] predniSONE  50 mg Oral QHS   metoprolol succinate  25 mg Oral Daily   pantoprazole  40 mg Oral Daily   pravastatin  40 mg Oral Daily    Continuous Infusions:  [START ON 12/16/2021] remdesivir 100 mg in NS 100 mL       LOS: 1 day  Kayleen Memos, MD [facility] Pager [phone]  If 7PM-7AM, please contact night-coverage [email or web address] Password [facility] 12/15/2021, 10:23 AM

## 2021-12-15 NOTE — Progress Notes (Signed)
Pt instructed on use of Flutter Valve.  Pt demonstrated with good effort and technique.  

## 2021-12-15 NOTE — ED Notes (Signed)
Attempted to call report, no answer

## 2021-12-16 ENCOUNTER — Other Ambulatory Visit: Payer: Self-pay

## 2021-12-16 ENCOUNTER — Inpatient Hospital Stay (HOSPITAL_COMMUNITY): Payer: Medicare HMO

## 2021-12-16 DIAGNOSIS — I10 Essential (primary) hypertension: Secondary | ICD-10-CM

## 2021-12-16 DIAGNOSIS — I5031 Acute diastolic (congestive) heart failure: Secondary | ICD-10-CM

## 2021-12-16 DIAGNOSIS — U071 COVID-19: Principal | ICD-10-CM

## 2021-12-16 DIAGNOSIS — E782 Mixed hyperlipidemia: Secondary | ICD-10-CM

## 2021-12-16 DIAGNOSIS — I5033 Acute on chronic diastolic (congestive) heart failure: Secondary | ICD-10-CM

## 2021-12-16 LAB — COMPREHENSIVE METABOLIC PANEL
ALT: 13 U/L (ref 0–44)
AST: 25 U/L (ref 15–41)
Albumin: 3.7 g/dL (ref 3.5–5.0)
Alkaline Phosphatase: 32 U/L — ABNORMAL LOW (ref 38–126)
Anion gap: 7 (ref 5–15)
BUN: 27 mg/dL — ABNORMAL HIGH (ref 8–23)
CO2: 28 mmol/L (ref 22–32)
Calcium: 9.4 mg/dL (ref 8.9–10.3)
Chloride: 101 mmol/L (ref 98–111)
Creatinine, Ser: 1.23 mg/dL — ABNORMAL HIGH (ref 0.44–1.00)
GFR, Estimated: 42 mL/min — ABNORMAL LOW (ref >60)
Glucose, Bld: 127 mg/dL — ABNORMAL HIGH (ref 70–99)
Potassium: 4.3 mmol/L (ref 3.5–5.1)
Sodium: 136 mmol/L (ref 135–145)
Total Bilirubin: 0.5 mg/dL (ref 0.3–1.2)
Total Protein: 7.2 g/dL (ref 6.5–8.1)

## 2021-12-16 LAB — ECHOCARDIOGRAM LIMITED
Height: 64 in
Weight: 3439.18 oz

## 2021-12-16 LAB — CBC
HCT: 40.2 % (ref 36.0–46.0)
Hemoglobin: 12.4 g/dL (ref 12.0–15.0)
MCH: 28.3 pg (ref 26.0–34.0)
MCHC: 30.8 g/dL (ref 30.0–36.0)
MCV: 91.8 fL (ref 80.0–100.0)
Platelets: 188 10*3/uL (ref 150–400)
RBC: 4.38 MIL/uL (ref 3.87–5.11)
RDW: 13.2 % (ref 11.5–15.5)
WBC: 11.4 10*3/uL — ABNORMAL HIGH (ref 4.0–10.5)
nRBC: 0 % (ref 0.0–0.2)

## 2021-12-16 LAB — D-DIMER, QUANTITATIVE: D-Dimer, Quant: 0.98 ug/mL-FEU — ABNORMAL HIGH (ref 0.00–0.50)

## 2021-12-16 LAB — PHOSPHORUS: Phosphorus: 3.9 mg/dL (ref 2.5–4.6)

## 2021-12-16 LAB — MAGNESIUM: Magnesium: 2.1 mg/dL (ref 1.7–2.4)

## 2021-12-16 LAB — C-REACTIVE PROTEIN: CRP: 3.9 mg/dL — ABNORMAL HIGH (ref <1.0)

## 2021-12-16 MED ORDER — VERAPAMIL HCL ER 240 MG PO TBCR
240.0000 mg | EXTENDED_RELEASE_TABLET | Freq: Every day | ORAL | Status: DC
  Administered 2021-12-16 – 2021-12-18 (×3): 240 mg via ORAL
  Filled 2021-12-16 (×4): qty 1

## 2021-12-16 MED ORDER — HYDRALAZINE HCL 25 MG PO TABS
25.0000 mg | ORAL_TABLET | Freq: Two times a day (BID) | ORAL | Status: DC
  Administered 2021-12-16 – 2021-12-18 (×5): 25 mg via ORAL
  Filled 2021-12-16 (×5): qty 1

## 2021-12-16 MED ORDER — METHYLPREDNISOLONE SODIUM SUCC 125 MG IJ SOLR
INTRAMUSCULAR | Status: AC
  Filled 2021-12-16: qty 2

## 2021-12-16 MED ORDER — POLYETHYLENE GLYCOL 3350 17 G PO PACK: 17.0000 g | PACK | Freq: Every day | ORAL | Status: DC | PRN

## 2021-12-16 MED ORDER — SENNOSIDES-DOCUSATE SODIUM 8.6-50 MG PO TABS
2.0000 | ORAL_TABLET | Freq: Two times a day (BID) | ORAL | Status: DC
  Administered 2021-12-16 – 2021-12-18 (×5): 2 via ORAL
  Filled 2021-12-16 (×5): qty 2

## 2021-12-16 MED ORDER — LISINOPRIL 20 MG PO TABS
20.0000 mg | ORAL_TABLET | Freq: Every day | ORAL | Status: DC
  Administered 2021-12-16 – 2021-12-18 (×3): 20 mg via ORAL
  Filled 2021-12-16 (×3): qty 1

## 2021-12-16 MED ORDER — FUROSEMIDE 10 MG/ML IJ SOLN: 20.0000 mg | Freq: Once | INTRAMUSCULAR | Status: DC

## 2021-12-16 MED ORDER — FUROSEMIDE 10 MG/ML IJ SOLN: 20.0000 mg | Freq: Two times a day (BID) | INTRAMUSCULAR | Status: DC

## 2021-12-16 MED ORDER — HYDRALAZINE HCL 20 MG/ML IJ SOLN
5.0000 mg | INTRAMUSCULAR | Status: AC | PRN
  Administered 2021-12-16 (×2): 5 mg via INTRAVENOUS
  Filled 2021-12-16 (×2): qty 1

## 2021-12-16 NOTE — Evaluation (Signed)
Physical Therapy Evaluation Patient Details Name: JOHN VASCONCELOS MRN: [id number] DOB: 1930-01-17 Today's Date: 12/16/2021  History of Present Illness  Pt is an 87 year old woman admitted from [facility] with COVID 19 infection and acute hypoxic respiratory failure. PMH: CHF, HTN, HLD, hypothyroidism, allergic rhinitis.  Clinical Impression  On eval, pt required Min A for mobility. She walked ~30 feet with a RW. O2 92% on RA after ambulation to bathroom. Pt tolerated activity well. End of session; O2 96% on RA at rest. Will plan to follow and progress activity as tolerated. Will benefit from daily ambulation with nursing (OOB>chair, ambulation to bathroom, etc.).        Recommendations for follow up therapy are one component of a multi-disciplinary discharge planning process, led by the attending physician.  Recommendations may be updated based on patient status, additional functional criteria and insurance authorization.  Follow Up Recommendations Home health PT    Assistance Recommended at Discharge Frequent or constant Supervision/Assistance  Functional Status Assessment Patient has had a recent decline in their functional status and demonstrates the ability to make significant improvements in function in a reasonable and predictable amount of time.  Equipment Recommendations  None recommended by PT    Recommendations for Other Services OT consult     Precautions / Restrictions Precautions Precautions: Fall Restrictions Weight Bearing Restrictions: No      Mobility  Bed Mobility Overal bed mobility: Modified Independent             General bed mobility comments: Increased time. HOB elevated    Transfers Overall transfer level: Needs assistance Equipment used: Rolling walker (2 wheels) Transfers: Sit to/from Stand Sit to Stand: Min assist           General transfer comment: Assist to rise, steady, control descent. Cues for safety, hand placement.     Ambulation/Gait Ambulation/Gait assistance: Min assist Gait Distance (Feet): 30 Feet Assistive device: Rolling walker (2 wheels) Gait Pattern/deviations: Step-through pattern;Decreased stride length;Trunk flexed       General Gait Details: Walked 15 feet x 1 to bathroom. Then walked another 30 feet around the room with RW. Improved stability as distance increased. O2 92% on RA, dyspnea 2/4.  Stairs            Wheelchair Mobility    Modified Rankin (Stroke Patients Only)       Balance Overall balance assessment: Needs assistance;History of Falls   Sitting balance-Leahy Scale: Good     Standing balance support: Bilateral upper extremity supported;Reliant on assistive device for balance Standing balance-Leahy Scale: Poor Standing balance comment: fair at sink, poor with ambulation requiring RW                             Pertinent Vitals/Pain Pain Assessment: Faces Faces Pain Scale: No hurt    Home Living Family/patient expects to be discharged to:: Private residence Living Arrangements: Alone Available Help at Discharge: Family;Available PRN/intermittently Type of Home: Independent living facility Home Access: [place]: One level Home Equipment: Grab bars - toilet;Grab bars - tub/shower;Shower seat;BSC/3in1;Rollator (4 wheels)      Prior Function Prior Level of Function : Needs assist             Mobility Comments: walks with a rollator ADLs Comments: assisted to clean her apartment and for transportation     Hand Dominance   Dominant Hand: Right  Extremity/Trunk Assessment   Upper Extremity Assessment Upper Extremity Assessment: Defer to OT evaluation    Lower Extremity Assessment Lower Extremity Assessment: Generalized weakness    Cervical / Trunk Assessment Cervical / Trunk Assessment: Kyphotic  Communication   Communication: HOH  Cognition Arousal/Alertness: Awake/alert Behavior During Therapy: WFL  for tasks assessed/performed Overall Cognitive Status: Within Functional Limits for tasks assessed                                 General Comments: difficult to accurately assess, [facility]        General Comments      Exercises     Assessment/Plan    PT Assessment Patient needs continued PT services  PT Problem List Decreased strength;Decreased mobility;Decreased activity tolerance;Decreased balance;Decreased knowledge of use of DME       PT Treatment Interventions DME instruction;Gait training;Therapeutic activities;Therapeutic exercise;Patient/family education;Balance training;Functional mobility training    PT Goals (Current goals can be found in the Care Plan section)  Acute Rehab PT Goals Patient Stated Goal: to be home for Christmas! PT Goal Formulation: With patient Time For Goal Achievement: 12/30/21 Potential to Achieve Goals: Good    Frequency Min 3X/week   Barriers to discharge        Co-evaluation               AM-PAC PT "6 Clicks" Mobility  Outcome Measure Help needed turning from your back to your side while in a flat bed without using bedrails?: A Little Help needed moving from lying on your back to sitting on the side of a flat bed without using bedrails?: A Little Help needed moving to and from a bed to a chair (including a wheelchair)?: A Little Help needed standing up from a chair using your arms (e.g., wheelchair or bedside chair)?: A Little Help needed to walk in hospital room?: A Little Help needed climbing 3-5 steps with a railing? : A Lot 6 Click Score: 17    End of Session Equipment Utilized During Treatment: Gait belt Activity Tolerance: Patient tolerated treatment well Patient left: in chair;with call bell/phone within reach;with chair alarm set   PT Visit Diagnosis: Unsteadiness on feet (R26.81);Muscle weakness (generalized) (M62.81)    Time: 5670-1410 PT Time Calculation (min) (ACUTE ONLY): 26 min   Charges:   PT  Evaluation $PT Eval Moderate Complexity: 1 Mod            Doreatha Massed, PT Acute Rehabilitation  Office: [phone] Pager: [phone]

## 2021-12-16 NOTE — Consult Note (Signed)
Cardiology Admission History and Physical:   Patient ID: [name] MRN: [id number]; DOB: [birth year]   Admission date: 12/14/2021  PCP:  [name], MD   [facility] Providers Cardiologist:  Quay Burow, MD        Chief Complaint: Acute hypoxic respiratory failure in a patient with chronic diastolic heart failure and acute COVID-19 infection  Patient Profile:   [name] is a 87 y.o. female with longstanding heart failure with preserved ejection fraction, hypertension, hyperlipidemia, treated hypothyroidism who is being seen 12/16/2021 for the evaluation of hypoxia and volume overload in the setting of acute COVID-19 infection.Marland Kitchen  History of Present Illness:   [name] on 1216 with dyspnea and hypoxemia.  She had acute respiratory distress in the setting of COVID-19 infection, with shortness of breath with minimal activity and subsequently at rest and nonproductive cough going on for roughly 3 days prior to admission.    She did not have any chest pain, either anginal or pleuritic.  She had subjective fever, but no chills or generalized aches and pains.  She has chronic lower extremity edema that has not worsened.  She denied  wheezing, hemoptysis, calf swelling or tenderness.  She was afebrile on presentation but tachypneic and hypoxemic (88% saturation on room air, 95-97% on 2 L oxygen by nasal cannula).  She was not hypotensive or tachycardic (chronically on verapamil and metoprolol).  Labs are significant for an elevated BNP of 442 (baseline 135 in 2021), nominally abnormal troponin with a flat pattern (23-24).  The chest x-ray showed cardiomegaly (portable film only) without overt pulmonary edema or infiltrates and without pleural effusion.  ECG shows sinus rhythm with a single PAC and no repolarization abnormalities.  Procalcitonin was not elevated.  A follow-up echocardiogram performed during this admission as compared to the previous study from September 2020  and found to be largely unchanged, other than some subtle evidence of right ventricular strain, suggestive of acute cor pulmonale (but overall RV systolic function is quite normal). LV  EF is 60-65%.  The inferior vena cava is plethoric, consistent with the clinical impression of volume overload.  Has been treated with steroids, remdesivir and intravenous diuretics.  On diuresis with net -3 L output in the last 24 hours.  There is very little change in renal function parameters despite diuresis.  She has been hypertensive since admission.   Past Medical History:  Diagnosis Date   Acute upper GI bleeding 03/12/2020   Arthritis    Depression    History of anemia    History of chicken pox    History of ulcer disease 1990s   HLD (hyperlipidemia)    HTN (hypertension)    Hypothyroidism    Osteoporosis 03/2016   at [facility] - T -3.1 hip, -1.0 spine   Uses hearing aid     Past Surgical History:  Procedure Laterality Date   [place]   BIOPSY  03/13/2020   Procedure: BIOPSY;  Surgeon: Ladene Artist, MD;  Location: [facility] ENDOSCOPY;  Service: Endoscopy;;   CARDIOVASCULAR STRESS TEST  2010   WNL   CARPAL TUNNEL RELEASE Left 04/28/2017   CATARACT EXTRACTION Bilateral 2012   COLONOSCOPY WITH PROPOFOL N/A 03/14/2020   single bleeding colonic angiectasia treated with APC Hilarie Fredrickson, Lajuan Lines, MD)   DEXA  03/2016   at [facility] - T -3.1 hip, -1.0 spine   ESOPHAGOGASTRODUODENOSCOPY (EGD) WITH PROPOFOL N/A 03/13/2020   erosive gastropathy with stigmata of  recent bleeding Fuller Plan, Pricilla Riffle, MD)   GALLBLADDER SURGERY  1974   HOT HEMOSTASIS N/A 03/14/2020   Procedure: HOT HEMOSTASIS (ARGON PLASMA COAGULATION/BICAP);  Surgeon: Jerene Bears, MD;  Location[facility] END[facility]COPY;  Service: Gastroenterology;  Laterality: N/A;   KNEE SURGERY  1987   right   KNEE SURGERY  1998   left   KNEE SURGERY  2008   POLYPECTOMY  03/14/2020   Procedure: POLYPECTOMY;  Surgeon: Jerene Bears, MD;  Location:  [facility] ENDOSCOPY;  Service: Gastroenterology;;   TONSILLECTOMY  1945   US ECHOCARDIOGRAPHY  2010   LVEF 65-75%, overall normal, mildly decreased LV diastolic compliance     Medications Prior to Admission: Prior to Admission medications   Medication Sig Start Date End Date Taking? Authorizing Provider  acetaminophen (TYLENOL) 500 MG tablet Take 1,000 mg by mouth daily.   Yes [provider]  calcium gluconate 500 MG tablet Take 1 tablet (500 mg total) by mouth daily. Patient taking differently: Take 500 mg by mouth daily. 04/14/21  Yes [name], MD  cholecalciferol (VITAMIN D) 1000 UNITS tablet Take 1,000 Units by mouth daily.   Yes [provider]  denosumab (PROLIA) 60 MG/ML SOSY injection Inject 60 mg into the skin every 6 (six) months. 12/06/19  Yes [name], MD  diphenhydramine-acetaminophen (TYLENOL PM) 25-500 MG TABS Take 2 tablets by mouth at bedtime as needed (sleep).    Yes [provider]  docusate sodium (COLACE) 100 MG capsule Take 100 mg by mouth 2 (two) times daily.   Yes [provider]  famotidine (PEPCID) 20 MG tablet Take 20 mg by mouth daily.   Yes [provider]  FLUoxetine (PROZAC) 40 MG capsule TAKE 1 CAPSULE EVERY DAY  (REPLACES  TABLETS) Patient taking differently: Take 40 mg by mouth daily. 08/16/21  Yes [name], MD  furosemide (LASIX) 20 MG tablet Take 0.5 tablets (10 mg total) by mouth 2 (two) times daily. 08/28/21  Yes [name], MD  hydrALAZINE (APRESOLINE) 25 MG tablet Take 1 tablet (25 mg total) by mouth in the morning and at bedtime. 09/11/21  Yes [name], MD  hydrOXYzine (ATARAX/VISTARIL) 10 MG tablet Take 0.5-1 tablets (5-10 mg total) by mouth 2 (two) times daily as needed for anxiety. 05/14/21  Yes [name], MD  levothyroxine (SYNTHROID) 88 MCG tablet TAKE 1 TABLET EVERY DAY BEFORE BREAKFAST Patient taking differently: Take 88 mcg by mouth daily before breakfast.  08/16/21  Yes [name], MD  lisinopril (ZESTRIL) 40 MG tablet TAKE 1 TABLET EVERY DAY (NEW DOSE) Patient taking differently: Take 40 mg by mouth daily. 11/02/21  Yes [name], MD  loratadine (CLARITIN) 10 MG tablet TAKE 1 TABLET EVERY EVENING Patient taking differently: Take 10 mg by mouth daily. 06/13/21  Yes [name], MD  metoprolol succinate (TOPROL-XL) 25 MG 24 hr tablet Take 1 tablet (25 mg total) by mouth daily. 10/02/21  Yes [name], MD  mirabegron ER (MYRBETRIQ) 25 MG TB24 tablet TAKE 1 TABLET EVERY DAY Patient taking differently: 25 mg daily. 08/16/21  Yes [name], MD  omeprazole (PRILOSEC) 40 MG capsule TAKE 1 CAPSULE EVERY DAY Patient taking differently: 40 mg daily. 08/16/21  Yes [name], MD  pravastatin (PRAVACHOL) 40 MG tablet TAKE 1 TABLET EVERY DAY Patient taking differently: Take 40 mg by mouth daily. 08/23/21  Yes [name], MD  Propylene Glycol (SYSTANE BALANCE OP) Place 1 drop into both eyes daily as needed (dry eyes).   Yes [provider]  verapamil (CALAN-SR) 240 MG CR tablet Take 1 tablet (240 mg total) by mouth daily. 09/14/21  Yes [name], MD  vitamin B-12 (CYANOCOBALAMIN) 500 MCG tablet Take 1 tablet (500 mcg total) by mouth once a week. Patient taking differently: Take 500 mcg by mouth once a week. Thursdays 03/08/20  Yes [name], MD     Allergies:   No Known Allergies  Social History:   Social History   Socioeconomic History   Marital status: Widowed    Spouse name: Not on file   Number of children: Not on file   Years of education: Not on file   Highest education level: Not on file  Occupational History   Not on file  Tobacco Use   Smoking status: Former    Types: Cigarettes    Quit date: 12/30/1992    Years since quitting: 28.9   Smokeless tobacco: Never  Vaping Use   Vaping Use: Never used  Substance and Sexual Activity   Alcohol use: No   Drug use: No   Sexual  activity: Never  Other Topics Concern   Not on file  Social History Narrative   Widower since 03-25-1999 - husband battled cancer for 6 yrs, died from MI   Daughter Ryelle Ruvalcaba) passed away 2015/03/25   Lies in [place] on [address].   Occupation: retired   HS: 12th grade   Activity: no regular exercise   Diet: good water, fruits/vegetables daily   Social Determinants of Radio broadcast assistant Strain: Not on file  Food Insecurity: Not on file  Transportation Needs: Not on file  Physical Activity: Not on file  Stress: Not on file  Social Connections: Not on file  Intimate Partner Violence: Not on file    Family History:   The patient's family history includes COPD in her sister; Cancer in her father; Cancer (age of onset: 58) in her sister; Cancer (age of onset: 42) in her sister; Cancer (age of onset: 24) in her mother; Cancer (age of onset: 46) in her brother; Cancer (age of onset: 1) in her sister; Diabetes in her daughter; Hypertension in her daughter. There is no history of CAD or Stroke.    ROS:  Please see the history of present illness.  All other ROS reviewed and negative.     Physical Exam/Data:   Vitals:   12/16/21 0629 12/16/21 0822 12/16/21 1212 12/16/21 1308  BP: (!) 179/110 (!) 181/87 (!) 177/80 (!) 182/81  Pulse: 79 88 75 75  Resp: (!) 21   18  Temp: 98.3 F (36.8 C)   98 F (36.7 C)  TempSrc: Oral   Oral  SpO2: 99% 98% 96% 97%  Weight:      Height:        Intake/Output Summary (Last 24 hours) at 12/16/2021 1532 Last data filed at 12/16/2021 0100 Gross per 24 hour  Intake --  Output 2050 ml  Net -2050 ml   Last 3 Weights 12/15/2021 12/15/2021 09/14/2021  Weight (lbs) 214 lb 15.2 oz 205 lb 227 lb 1 oz  Weight (kg) 97.5 kg 92.987 kg 102.995 kg     Body mass index is 36.9 kg/m.  General:  Well nourished, well developed, in no acute distress, obese HEENT: normal Neck: no JVD Vascular: No carotid bruits; Distal pulses 2+ bilaterally    Cardiac:  normal S1, S2; RRR; no murmur  Lungs:  clear to auscultation bilaterally, no wheezing, rhonchi or rales  Abd:  soft, nontender, no hepatomegaly  Ext: no edema Musculoskeletal:  No deformities, BUE and BLE strength normal and equal Skin: warm and dry  Neuro:  CNs 2-12 intact, no focal abnormalities noted Psych:  Normal affect    EKG:  The ECG that was done 12/14/2021 was personally reviewed and demonstrates rhythm with a single PAC and left axis deviation due to left anterior fascicular block, no ischemic repolarization abnormalities.  Relevant CV Studies: Echocardiogram 12/16/2021   1. Right ventricular systolic function basal function is normal. There  appears to be mild mid wall hypokinesis suggestive of RV strain.   2. Left ventricular ejection fraction, by estimation, is 60 to 65%. The  left ventricle has normal function. The left ventricle has no regional  wall motion abnormalities.   3. The mitral valve is grossly normal. Mild mitral valve regurgitation.   4. The aortic valve was not well visualized. Aortic valve regurgitation  is not visualized. Aortic valve sclerosis is present, with no evidence of  aortic valve stenosis.   5. The inferior vena cava is dilated in size with <50% respiratory  variability, suggesting right atrial pressure of 15 mmHg.   Comparison(s): A prior study was performed on 09/09/21. RV strain findings  new from prior.   Laboratory Data:  High Sensitivity Troponin:   Recent Labs  Lab 12/14/21 1703 12/14/21 1845  TROPONINIHS 24* 23*      Chemistry Recent Labs  Lab 12/15/21 0240 12/15/21 1627 12/16/21 0652  NA 137 136 136  K 4.3 4.2 4.3  CL 102 103 101  CO2 26 25 28   GLUCOSE 156* 104* 127*  BUN 15 20 27*  CREATININE 1.28* 1.17* 1.23*  CALCIUM 9.6 9.5 9.4  MG 2.2  --  2.1  GFRNONAA 40* 45* 42*  ANIONGAP 9 8 7     Recent Labs  Lab 12/15/21 0240 12/16/21 0652  PROT 6.9 7.2  ALBUMIN 3.5 3.7  AST 15 25  ALT 10 13   ALKPHOS 28* 32*  BILITOT 0.5 0.5   Lipids  Recent Labs  Lab 12/14/21 1703  TRIG 82   Hematology Recent Labs  Lab 12/15/21 0240 12/16/21 0652  WBC 6.6 11.4*  RBC 3.89 4.38  HGB 11.0* 12.4  HCT 36.2 40.2  MCV 93.1 91.8  MCH 28.3 28.3  MCHC 30.4 30.8  RDW 13.7 13.2  PLT 163 188   Thyroid No results for input(s): TSH, FREET4 in the last 168 hours. BNP Recent Labs  Lab 12/14/21 1703  BNP 441.7*    DDimer  Recent Labs  Lab 12/16/21 4665  DDIMER 0.98*     Radiology/Studies:  ECHOCARDIOGRAM LIMITED  Result Date: 12/16/2021    ECHOCARDIOGRAM LIMITED REPORT   Patient Name:   [name] Date of Exam: 12/16/2021 Medical Rec #:  [id number]      Height:       64.0 in Accession #:    [id number]     Weight:       214.9 lb Date of Birth:  05-08-1930       BSA:          2.017 m Patient Age:    48 years       BP:           179/110 mmHg Patient Gender: F              HR:           98 bpm. Exam Location:  Inpatient Procedure: Limited Echo, Color Doppler  and Cardiac Doppler Indications:    I14.43 Acute diastolic (congestive) heart failure  History:        Patient has prior history of Echocardiogram examinations, most                 recent 09/10/2019. Risk Factors:Hypertension and Dyslipidemia.  Sonographer:    Raquel Sarna Senior RDCS Referring Phys: [id number] [place]  Sonographer Comments: COVID+ at time of study IMPRESSIONS  1. Right ventricular systolic function basal function is normal. There appears to be mild mid wall hypokinesis suggestive of RV strain.  2. Left ventricular ejection fraction, by estimation, is 60 to 65%. The left ventricle has normal function. The left ventricle has no regional wall motion abnormalities.  3. The mitral valve is grossly normal. Mild mitral valve regurgitation.  4. The aortic valve was not well visualized. Aortic valve regurgitation is not visualized. Aortic valve sclerosis is present, with no evidence of aortic valve stenosis.  5. The inferior vena cava  is dilated in size with <50% respiratory variability, suggesting right atrial pressure of 15 mmHg. Comparison(s): A prior study was performed on 09/09/21. RV strain findings new from prior. FINDINGS  Left Ventricle: Left ventricular ejection fraction, by estimation, is 60 to 65%. The left ventricle has normal function. The left ventricle has no regional wall motion abnormalities. Right Ventricle: Right ventricular systolic function is normal. Pericardium: Trivial pericardial effusion is present. Presence of epicardial fat layer. Mitral Valve: The mitral valve is grossly normal. Mild mitral valve regurgitation. Tricuspid Valve: The tricuspid valve is normal in structure. Tricuspid valve regurgitation is not demonstrated. No evidence of tricuspid stenosis. Aortic Valve: The aortic valve was not well visualized. Aortic valve regurgitation is not visualized. Aortic valve sclerosis is present, with no evidence of aortic valve stenosis. Venous: The inferior vena cava is dilated in size with less than 50% respiratory variability, suggesting right atrial pressure of 15 mmHg. RIGHT VENTRICLE RV S prime:     18.30 cm/s TAPSE (M-mode): 3.0 cm AORTIC VALVE LVOT Vmax:   89.40 cm/s LVOT Vmean:  60.100 cm/s LVOT VTI:    0.199 m  SHUNTS Systemic VTI: 0.20 m Rudean Haskell MD Electronically signed by Rudean Haskell MD Signature Date/Time: 12/16/2021/1:51:06 PM    Final      Assessment and Plan:   Acute on chronic respiratory failure: Based on chest x-ray and clinical findings my suspicion is that this is primarily due to her COVID-19 infection, with a secondary contribution of the heart failure exacerbation.  This would also be supported by the subtle changes in right ventricular function, with normal LV function.  She clearly has superimposed heart failure.  She has improved since admission with comprehensive treatment for both viral infection and hypervolemia. Acute on chronic diastolic heart failure: Unable to  estimate left heart filling pressures on her limited echocardiogram, but the BNP is unequivocally elevated.  Previous report of "severe asymmetric left ventricular hypertrophy of the basal septal wall" is not confirmed on the current echocardiogram.  She likely does not have obstructive hypertrophic cardiomyopathy, but probably has mild LVH due to hypertension and age-related sigmoid interventricular septum D formation.Marland Kitchen  Response to diuretics without hypotension or worsening renal function.  Continue diuretics.  Based on outpatient records, her "dry weight" appears to be around 225-230 pounds, but her documented weight on admission was much lower at only 215 pounds.  I am not sure if this was an actual weight or just an estimate.  True weight measurement would be very  useful. HTN: Currently receiving metoprolol 25 mg once daily and verapamil 240 mg once daily MS her home prescription, but is not receiving her lisinopril 40 mg daily or hydralazine 25 mg twice daily which was recently started by Dr. Danise Mina.  Her blood pressure is quite high.  Her creatinine is at baseline better than baseline.  Not withstanding the diuretics, I think we should restart her lisinopril. CKD stage IIIb:  Baseline creatinine seems to be around 1.3-1.4, corresponding to a GFR of 30-35.  Current creatinine 1.23. COVID-19 infection: improved on treatment with steroids and remdesivir.   Risk Assessment/Risk Scores:       New York Heart Association (NYHA) Functional Class NYHA Class III     For questions or updates, please contact [facility] Please consult [email or web address] for contact info under     Signed, Sanda Klein, MD  12/16/2021 3:32 PM

## 2021-12-16 NOTE — Progress Notes (Signed)
Echocardiogram 2D Echocardiogram has been performed.  Oneal Deputy Kayna Suppa RDCS 12/16/2021, 9:00 AM

## 2021-12-16 NOTE — Progress Notes (Addendum)
PROGRESS NOTE  [name] IZT:[id number] DOB: 02-03-1930 DOA: 12/14/2021 PCP: [name], MD  HPI/Recap of past 24 hours: [name] is a 87 y.o. female with medical history significant for chronic diastolic CHF, hypertension, hyperlipidemia, acquired hypothyroidism, allergic rhinitis, who presented to [facility] ED from SNF due to progressively worsening shortness of breath of 2 to 3 days duration.  Associated with a nonproductive cough.  Work-up revealed COVID-19 viral infection, acute hypoxic respiratory failure with O2 saturation in the 80s on room air at presentation, volume overload with a BNP of greater than 400.  She was started on antiviral, IV remdesivir, and IV Solu-Medrol.  Her home oral diuretic Lasix at 10 mg twice daily was resumed.  She received an additional dose of IV Lasix 20 mg on 12/15/2021.  12/16/2021: Patient was seen and examined at her bedside.  Reports her cough is better.  Denies any chest pain.  Evaluated by OT with recommendation for home health OT.  She is on antiviral, IV remdesivir, IV Solu-Medrol, along with bronchodilators, pulmonary toilet, and antitussives, continue.    Assessment/Plan: Principal Problem:   COVID-19 virus infection Active Problems:   Hypothyroidism   Hyperlipidemia   Hypertension   Chronic diastolic CHF (congestive heart failure) (HCC)   SOB (shortness of breath)   Hypokalemia   Hypomagnesemia   Allergic rhinitis  COVID-19 viral infection Personally reviewed chest x-ray done on admission which showed cardiomegaly with mild increase in pulmonary vascularity suggestive of mild pulm edema, no focal infiltrates. Continue antiviral, IV Remdesivir Continue IV Solu-Medrol started in the ED. Continue vitamin C, D3 and zinc. Continue Combivent inhaler every 6 hours Continue antitussives, Tessalon Perles Continue incentive spirometer Continue flutter valve Continue to mobilize as tolerated  Acute hypoxic respiratory failure  secondary to COVID-19 viral infection versus mild pulmonary edema Not on oxygen supplementation at baseline Currently on 2 L to maintain O2 saturation greater 92% Wean off oxygen supplementation as tolerated Continue to treat underlying condition Continue IV remdesivir Continue to diurese as tolerated Continue bronchodilators Continue pulmonary toilet Home oxygen evaluation on 12/16/2021 for DC planning.  Acute on chronic diastolic CHF Presented with gradually worsening dyspnea Mild pulmonary edema seen on chest x-ray 2+ pitting edema in lower extremities bilaterally BNP greater than 400 Last 2D echo done on 09/10/2019 showed LVEF 60 to 65% with severe asymmetric left ventricular hypertrophy of the basal septal wall.  The right ventricle has mildly reduced systolic function. Continue strict I's and O's and daily weight Follow limited 2D echo, completed on 12/16/2021. Received a dose of IV Lasix on 12/15/21, 20 mg x 1 She is currently on home dose p.o. Lasix 10 mg twice daily Cardiology consulted  Essential hypertension, BP not at goal Resume home oral antihypertensives She is currently on p.o. Lasix 10 mg twice daily, p.o. hydralazine 25 mg twice daily, Toprol-XL 25 mg daily and verapamil 240 mg daily. Closely monitor vital signs and avoid hypotension  Resolved anemia of chronic disease Hemoglobin 11.0> 12.4 on 12/16/2021. No overt bleeding  CKD 3B Baseline creatinine appears to be 1.2 with GFR 42 Creatinine uptrending this morning 1.23 Received a dose of IV Lasix 20 mg x 1 on 12/15/2021 Currently on home p.o. Lasix 10 mg twice daily. Continue to monitor urine output with strict I's and O's Continue to avoid nephrotoxic agents and hypotension Repeat BMP on 12/17/2021.  Hypothyroidism Continue home levothyroxine  GERD Continue home Protonix and Pepcid  Hyperlipidemia  home pravastatin  Chronic anxiety/depression Continue home  Prozac  Physical debility/ambulatory  dysfunction Assessed by OT recommendation for home health OT PT evaluation is pending. Continue PT OT with assistance and fall precautions.  Chronic constipation Added Senokot 2 tablets twice daily MiraLAX 17 g daily as needed   Code Status: Full code  Family Communication: None at bedside  Disposition Plan: Likely will discharge back to SNF possibly on 12/17/2021 or 12/18/2021.   Consultants: Cardiology  Procedures: Limited 2D echo 12/15/2021  Antimicrobials: Antiviral IV remdesivir.  DVT prophylaxis: Subcu Lovenox daily  Status is: Inpatient  Patient requires at least 2 midnights for further evaluation and treatment of present condition.      Objective: Vitals:   12/15/21 2059 12/16/21 0129 12/16/21 0629 12/16/21 0822  BP: (!) 169/84 (!) 183/108 (!) 179/110 (!) 181/87  Pulse: 83 76 79 88  Resp: (!) 21 19 (!) 21   Temp: 98.7 F (37.1 C) 98.6 F (37 C) 98.3 F (36.8 C)   TempSrc: Oral Oral Oral   SpO2: 98% 97% 99% 98%  Weight: 97.5 kg     Height: 5\' 4"  (1.626 m)       Intake/Output Summary (Last 24 hours) at 12/16/2021 1202 Last data filed at 12/16/2021 0100 Gross per 24 hour  Intake --  Output 2050 ml  Net -2050 ml   Filed Weights   12/15/21 1055 12/15/21 2059  Weight: 93 kg 97.5 kg    Exam:  General: 87 y.o. year-old female well-developed well-nourished in no acute distress.  She is alert and interactive. Cardiovascular: Regular rate and rhythm no rubs or gallops.  No JVD or thyromegaly noted.   Respiratory: Mild rales at bases no wheezing noted.  Poor inspiratory effort.   Abdomen: Soft monitor normal bowel sounds is appropriate Musculoskeletal: Lower extremity edema has improved, 1+ pitting edema from 2+ pitting edema yesterday.   Skin: No ulcerative lesions noted.  Mild hyperpigmentation involving bilateral lower extremities.  Psychiatry: Mood is appropriate for condition and setting.   Data Reviewed: CBC: Recent Labs  Lab  12/14/21 1703 12/15/21 0240 12/16/21 0652  WBC 8.8 6.6 11.4*  NEUTROABS 6.2 6.2  --   HGB 11.8* 11.0* 12.4  HCT 38.3 36.2 40.2  MCV 93.0 93.1 91.8  PLT 178 163 979   Basic Metabolic Panel: Recent Labs  Lab 12/14/21 1703 12/14/21 1845 12/15/21 0240 12/15/21 1627 12/16/21 0652  NA 135  --  137 136 136  K 3.4*  --  4.3 4.2 4.3  CL 104  --  102 103 101  CO2 22  --  26 25 28   GLUCOSE 137*  --  156* 104* 127*  BUN 13  --  15 20 27*  CREATININE 1.14*  --  1.28* 1.17* 1.23*  CALCIUM 9.7  --  9.6 9.5 9.4  MG  --  1.6* 2.2  --  2.1  PHOS  --   --  3.4  --  3.9   GFR: Estimated Creatinine Clearance: 35.1 mL/min (A) (by C-G formula based on SCr of 1.23 mg/dL (H)). Liver Function Tests: Recent Labs  Lab 12/14/21 1703 12/15/21 0240 12/16/21 0652  AST 16 15 25   ALT 11 10 13   ALKPHOS 30* 28* 32*  BILITOT 0.7 0.5 0.5  PROT 7.1 6.9 7.2  ALBUMIN 3.8 3.5 3.7   No results for input(s): LIPASE, AMYLASE in the last 168 hours. No results for input(s): AMMONIA in the last 168 hours. Coagulation Profile: No results for input(s): INR, PROTIME in the last 168 hours. Cardiac Enzymes:  No results for input(s): CKTOTAL, CKMB, CKMBINDEX, TROPONINI in the last 168 hours. BNP (last 3 results) Recent Labs    08/17/21 1249  PROBNP 260.0*   HbA1C: No results for input(s): HGBA1C in the last 72 hours. CBG: No results for input(s): GLUCAP in the last 168 hours. Lipid Profile: Recent Labs    12/14/21 1703  TRIG 82   Thyroid Function Tests: No results for input(s): TSH, T4TOTAL, FREET4, T3FREE, THYROIDAB in the last 72 hours. Anemia Panel: Recent Labs    12/14/21 1703  FERRITIN 81   Urine analysis:    Component Value Date/Time   COLORURINE YELLOW 07/13/2009 1040   APPEARANCEUR CLOUDY (A) 07/13/2009 1040   LABSPEC 1.026 07/13/2009 1040   PHURINE 5.5 07/13/2009 1040   GLUCOSEU NEGATIVE 07/13/2009 1040   HGBUR NEGATIVE 07/13/2009 1040   BILIRUBINUR negative 09/01/2019 1317    KETONESUR NEGATIVE 07/13/2009 1040   PROTEINUR Negative 09/01/2019 1317   PROTEINUR NEGATIVE 07/13/2009 1040   UROBILINOGEN 0.2 09/01/2019 1317   UROBILINOGEN 0.2 07/13/2009 1040   NITRITE negative 09/01/2019 1317   NITRITE NEGATIVE 07/13/2009 1040   LEUKOCYTESUR Small (1+) (A) 09/01/2019 1317   Sepsis Labs: @LABRCNTIP (procalcitonin:4,lacticidven:4)  ) Recent Results (from the past 240 hour(s))  Blood Culture (routine x 2)     Status: None (Preliminary result)   Collection Time: 12/14/21  4:50 PM   Specimen: BLOOD  Result Value Ref Range Status   Specimen Description   Final    BLOOD LEFT ANTECUBITAL Performed at [facility], [place] [address] [place], [place] [address]    Special Requests   Final    BOTTLES DRAWN AEROBIC AND ANAEROBIC Blood Culture results may not be optimal due to an inadequate volume of blood received in culture bottles Performed at [facility] [address] [place], [place] [address]    Culture   Final    NO GROWTH < 24 HOURS Performed at [facility] [address], [place], [place] [address]    Report Status PENDING  Incomplete  Blood Culture (routine x 2)     Status: None (Preliminary result)   Collection Time: 12/14/21  4:54 PM   Specimen: BLOOD  Result Value Ref Range Status   Specimen Description   Final    BLOOD RIGHT ANTECUBITAL Performed at [facility] [address], [place], [place] [address]    Special Requests   Final    BOTTLES DRAWN AEROBIC AND ANAEROBIC Blood Culture adequate volume Performed at [facility] [address], [place], [place] [address]    Culture   Final    NO GROWTH < 24 HOURS Performed at [facility] [address], [place], [place] [address]    Report Status PENDING  Incomplete  Resp Panel by RT-PCR (Flu A&B, Covid) Nasopharyngeal Swab     Status: Abnormal   Collection Time: 12/14/21  5:03 PM   Specimen:  Nasopharyngeal Swab; Nasopharyngeal(NP) swabs in vial transport medium  Result Value Ref Range Status   SARS Coronavirus 2 by RT PCR POSITIVE (A) NEGATIVE Final    Comment: RESULT CALLED TO, READ BACK BY AND VERIFIED WITH: ELLWANGER,A. 12/14/21 @2154  BY SEEL,M. (NOTE) SARS-CoV-2 target nucleic acids are DETECTED.  The SARS-CoV-2 RNA is generally detectable in upper respiratory specimens during the acute phase of infection. Positive results are indicative of the presence of the identified virus, but do not rule out bacterial infection or co-infection with other pathogens not  detected by the test. Clinical correlation with patient history and other diagnostic information is necessary to determine patient infection status. The expected result is Negative.  Fact Sheet for Patients: EntrepreneurPulse.com.au  Fact Sheet for Healthcare Providers: IncredibleEmployment.be  This test is not yet approved or cleared by the [place] FDA and  has been authorized for detection and/or diagnosis of SARS-CoV-2 by FDA under an Emergency Use Authorization (EUA).  This EUA will remain in effect (meaning this test c an be used) for the duration of  the COVID-19 declaration under Section 564(b)(1) of the Act, 21 U.S.C. section 360bbb-3(b)(1), unless the authorization is terminated or revoked sooner.     Influenza A by PCR NEGATIVE NEGATIVE Final   Influenza B by PCR NEGATIVE NEGATIVE Final    Comment: (NOTE) The Xpert Xpress SARS-CoV-2/FLU/RSV plus assay is intended as an aid in the diagnosis of influenza from Nasopharyngeal swab specimens and should not be used as a sole basis for treatment. Nasal washings and aspirates are unacceptable for Xpert Xpress SARS-CoV-2/FLU/RSV testing.  Fact Sheet for Patients: EntrepreneurPulse.com.au  Fact Sheet for Healthcare Providers: IncredibleEmployment.be  This test is not yet  approved or cleared by the [place] FDA and has been authorized for detection and/or diagnosis of SARS-CoV-2 by FDA under an Emergency Use Authorization (EUA). This EUA will remain in effect (meaning this test can be used) for the duration of the COVID-19 declaration under Section 564(b)(1) of the Act, 21 U.S.C. section 360bbb-3(b)(1), unless the authorization is terminated or revoked.  Performed at [facility], [place] [address], [place],  [address]       Studies: No results found.  Scheduled Meds:  vitamin C  500 mg Oral Daily   benzonatate  200 mg Oral TID   cholecalciferol  1,000 Units Oral Daily   enoxaparin (LOVENOX) injection  40 mg Subcutaneous Daily   famotidine  20 mg Oral Daily   FLUoxetine  40 mg Oral Daily   furosemide  10 mg Oral BID   Ipratropium-Albuterol  1 puff Inhalation Q6H   levothyroxine  88 mcg Oral Q0600   loratadine  10 mg Oral Daily   methylPREDNISolone (SOLU-MEDROL) injection  100 mg Intravenous QHS   Followed by   Derrill Memo ON 12/17/2021] predniSONE  50 mg Oral QHS   metoprolol succinate  25 mg Oral Daily   pantoprazole  40 mg Oral Daily   pravastatin  40 mg Oral Daily   senna-docusate  2 tablet Oral BID   zinc sulfate  220 mg Oral Daily    Continuous Infusions:  remdesivir 100 mg in NS 100 mL 100 mg (12/16/21 0833)     LOS: 2 days     Kayleen Memos, MD [facility] Pager [phone]  If 7PM-7AM, please contact night-coverage [email or web address] Password [email or web address] 12/16/2021, 12:02 PM

## 2021-12-16 NOTE — Evaluation (Signed)
Occupational Therapy Evaluation Patient Details Name: [name] MRN: [id number] DOB: 10-14-1930 Today's Date: 12/16/2021   History of Present Illness Pt is an 87 year old woman admitted from [facility] with COVID 19 infection and acute hypoxic respiratory failure. PMH: CHF, HTN, HLD, hypothyroidism, allergic rhinitis.   Clinical Impression   Pt lives alone, ambulate with a rollator and is independent in self care and meal prep. She has assist for housekeeping and transportation. Pt's daughter lives locally and is supportive. Pt presents with generalized weakness and impaired standing balance with decreased activity tolerance. Sp02 was 92-96% on RA. Pt ambulated with min assist initially and progressed to min guard assist in her room. She needs up to min assist for ADL. Pt likely to progress well and be able to return home.      Recommendations for follow up therapy are one component of a multi-disciplinary discharge planning process, led by the attending physician.  Recommendations may be updated based on patient status, additional functional criteria and insurance authorization.   Follow Up Recommendations  Home health OT    Assistance Recommended at Discharge Frequent or constant Supervision/Assistance  Functional Status Assessment  Patient has had a recent decline in their functional status and demonstrates the ability to make significant improvements in function in a reasonable and predictable amount of time.  Equipment Recommendations  None recommended by OT    Recommendations for Other Services       Precautions / Restrictions Precautions Precautions: Fall Restrictions Weight Bearing Restrictions: No      Mobility Bed Mobility Overal bed mobility: Modified Independent             General bed mobility comments: HOB up    Transfers Overall transfer level: Needs assistance Equipment used: Rolling walker (2 wheels) Transfers: Sit to/from Stand Sit to  Stand: Min assist           General transfer comment: from bed and toilet      Balance Overall balance assessment: Needs assistance   Sitting balance-Leahy Scale: Good     Standing balance support: No upper extremity supported;During functional activity Standing balance-Leahy Scale: Fair Standing balance comment: fair at sink, poor with ambulation requiring RW                           ADL either performed or assessed with clinical judgement   ADL Overall ADL's : Needs assistance/impaired Eating/Feeding: Independent;Sitting   Grooming: Wash/dry hands;Standing;Min guard   Upper Body Bathing: Minimal assistance;Sitting   Lower Body Bathing: Minimal assistance;Sit to/from stand   Upper Body Dressing : Minimal assistance;Sitting   Lower Body Dressing: Minimal assistance;Sit to/from stand   Toilet Transfer: Ambulation;Rolling walker (2 wheels);Minimal assistance;Grab bars   Toileting- Clothing Manipulation and Hygiene: Minimal assistance;Sit to/from stand Toileting - Clothing Manipulation Details (indicate cue type and reason): independent with pericare leaning to side     Functional mobility during ADLs: Minimal assistance;Min guard;Rolling walker (2 wheels)       Vision Baseline Vision/History: 1 Wears glasses Ability to See in Adequate Light: 0 Adequate Patient Visual Report: No change from baseline       Perception     Praxis      Pertinent Vitals/Pain Pain Assessment: Faces Faces Pain Scale: No hurt     Hand Dominance Right   Extremity/Trunk Assessment Upper Extremity Assessment Upper Extremity Assessment: Overall WFL for tasks assessed   Lower Extremity Assessment Lower Extremity Assessment: Defer to  PT evaluation   Cervical / Trunk Assessment Cervical / Trunk Assessment: Kyphotic   Communication Communication Communication: HOH   Cognition Arousal/Alertness: Awake/alert Behavior During Therapy: WFL for tasks  assessed/performed Overall Cognitive Status: Within Functional Limits for tasks assessed                                 General Comments: difficult to accurately assess, [facility]     General Comments       Exercises     Shoulder Instructions      Home Living Family/patient expects to be discharged to:: Private residence Living Arrangements: Alone Available Help at Discharge: Family;Available PRN/intermittently Type of Home: Independent living facility Home Access: Elevator     Home Layout: One level     Bathroom Shower/Tub: Occupational psychologist: Standard     Home Equipment: Grab bars - toilet;Grab bars - tub/shower;Shower seat;BSC/3in1;Rollator (4 wheels)          Prior Functioning/Environment Prior Level of Function : Needs assist             Mobility Comments: walks with a rollator ADLs Comments: assisted to clean her apartment and for transportation        OT Problem List: Decreased activity tolerance;Impaired balance (sitting and/or standing);Cardiopulmonary status limiting activity      OT Treatment/Interventions: Self-care/ADL training;DME and/or AE instruction;Patient/family education;Balance training;Therapeutic activities;Energy conservation    OT Goals(Current goals can be found in the care plan section) Acute Rehab OT Goals OT Goal Formulation: With patient Time For Goal Achievement: 12/30/21 Potential to Achieve Goals: Good ADL Goals Pt Will Perform Grooming: with modified independence;standing Pt Will Perform Lower Body Bathing: with modified independence;sit to/from stand Pt Will Perform Lower Body Dressing: with modified independence;sit to/from stand Pt Will Transfer to Toilet: with modified independence;ambulating;bedside commode Pt Will Perform Toileting - Clothing Manipulation and hygiene: with modified independence;sit to/from stand Additional ADL Goal #1: Pt will state at least 3 energy conservation strategies  as instructed.  OT Frequency: Min 2X/week   Barriers to D/C:            Co-evaluation              AM-PAC OT "6 Clicks" Daily Activity     Outcome Measure Help from another person eating meals?: None Help from another person taking care of personal grooming?: A Little Help from another person toileting, which includes using toliet, bedpan, or urinal?: A Little Help from another person bathing (including washing, rinsing, drying)?: A Little Help from another person to put on and taking off regular upper body clothing?: A Little Help from another person to put on and taking off regular lower body clothing?: A Little 6 Click Score: 19   End of Session Equipment Utilized During Treatment: Gait belt;Rolling walker (2 wheels) Nurse Communication: Mobility status;Other (comment) (Sp02 92-96% on RA)  Activity Tolerance: Patient tolerated treatment well Patient left: in chair;with call bell/phone within reach;with chair alarm set  OT Visit Diagnosis: Unsteadiness on feet (R26.81);Other abnormalities of gait and mobility (R26.89);Muscle weakness (generalized) (M62.81)                Time: 9735-3299 OT Time Calculation (min): 15 min Charges:  OT General Charges $OT Visit: 1 Visit OT Evaluation $OT Eval Moderate Complexity: 1 Mod  Nestor Lewandowsky, OTR/L Acute Rehabilitation Services Pager: [phone] Office: [phone]  [name] 12/16/2021, 11:43 AM

## 2021-12-16 NOTE — Progress Notes (Signed)
SATURATION QUALIFICATIONS: (This note is used to comply with regulatory documentation for home oxygen)  Patient Saturations on Room Air at Rest = 97%  Patient Saturations on Room Air while Ambulating = 97%  Patient Saturations on 0 Liters of oxygen while Ambulating = 97%  Please briefly explain why patient needs home oxygen: 

## 2021-12-17 DIAGNOSIS — I5032 Chronic diastolic (congestive) heart failure: Secondary | ICD-10-CM

## 2021-12-17 LAB — CBC
HCT: 39 % (ref 36.0–46.0)
Hemoglobin: 12.2 g/dL (ref 12.0–15.0)
MCH: 28.6 pg (ref 26.0–34.0)
MCHC: 31.3 g/dL (ref 30.0–36.0)
MCV: 91.5 fL (ref 80.0–100.0)
Platelets: 193 10*3/uL (ref 150–400)
RBC: 4.26 MIL/uL (ref 3.87–5.11)
RDW: 13.3 % (ref 11.5–15.5)
WBC: 8.8 10*3/uL (ref 4.0–10.5)
nRBC: 0 % (ref 0.0–0.2)

## 2021-12-17 LAB — BASIC METABOLIC PANEL
Anion gap: 9 (ref 5–15)
BUN: 34 mg/dL — ABNORMAL HIGH (ref 8–23)
CO2: 27 mmol/L (ref 22–32)
Calcium: 9.8 mg/dL (ref 8.9–10.3)
Chloride: 101 mmol/L (ref 98–111)
Creatinine, Ser: 1.16 mg/dL — ABNORMAL HIGH (ref 0.44–1.00)
GFR, Estimated: 45 mL/min — ABNORMAL LOW (ref >60)
Glucose, Bld: 132 mg/dL — ABNORMAL HIGH (ref 70–99)
Potassium: 4.3 mmol/L (ref 3.5–5.1)
Sodium: 137 mmol/L (ref 135–145)

## 2021-12-17 LAB — D-DIMER, QUANTITATIVE: D-Dimer, Quant: 0.76 ug/mL-FEU — ABNORMAL HIGH (ref 0.00–0.50)

## 2021-12-17 LAB — MAGNESIUM: Magnesium: 1.9 mg/dL (ref 1.7–2.4)

## 2021-12-17 LAB — C-REACTIVE PROTEIN: CRP: 1.8 mg/dL — ABNORMAL HIGH (ref <1.0)

## 2021-12-17 LAB — PHOSPHORUS: Phosphorus: 3.8 mg/dL (ref 2.5–4.6)

## 2021-12-17 MED ORDER — POTASSIUM CHLORIDE CRYS ER 20 MEQ PO TBCR
40.0000 meq | EXTENDED_RELEASE_TABLET | Freq: Every day | ORAL | Status: DC
  Administered 2021-12-17: 11:00:00 40 meq via ORAL
  Filled 2021-12-17: qty 2

## 2021-12-17 MED ORDER — FUROSEMIDE 10 MG/ML IJ SOLN
20.0000 mg | Freq: Two times a day (BID) | INTRAMUSCULAR | Status: DC
Start: 2021-12-17 — End: 2021-12-17
  Administered 2021-12-17: 07:00:00 20 mg via INTRAVENOUS
  Filled 2021-12-17: qty 2

## 2021-12-17 NOTE — Progress Notes (Signed)
PROGRESS NOTE  [name]:[id number] DOB: [birth year] DOA: 12/14/2021 PCP: [name], MD  HPI/Recap of past 24 hours: [name] is a 87 y.o. female with medical history significant for chronic diastolic CHF, hypertension, hyperlipidemia, acquired hypothyroidism, allergic rhinitis, who presented to [facility] ED from SNF due to progressively worsening shortness of breath of 2 to 3 days duration.  Associated with a nonproductive cough.  Work-up revealed COVID-19 viral infection, acute hypoxic respiratory failure with O2 saturation in the 80s on room air at presentation, volume overload with a BNP of greater than 400.  She was started on antiviral, IV remdesivir, and IV Solu-Medrol.  Her home oral diuretic Lasix at 10 mg twice daily was resumed.  She received an additional dose of IV Lasix 20 mg on 12/15/2021.  She was seen by cardiology, recommendation to continue to diurese.    Passed her home oxygen evaluation on 12/16/2021, no longer requiring oxygen supplementation.  Seen by PT OT with recommendation for home health PT OT.  12/17/2021: Seen at her bedside.  States she feels better.  Cough has improved.  Denies any chest pain.  Seen by cardiology, signed off, will arrange follow-up in approximately 3 months in the office.    Assessment/Plan: Principal Problem:   COVID-19 virus infection Active Problems:   Hypothyroidism   Hyperlipidemia   Hypertension   Chronic diastolic CHF (congestive heart failure) (HCC)   SOB (shortness of breath)   Hypokalemia   Hypomagnesemia   Allergic rhinitis  COVID-19 viral infection Personally reviewed chest x-ray done on admission which showed cardiomegaly with mild increase in pulmonary vascularity suggestive of mild pulm edema, no focal infiltrates. Continue antiviral, IV Remdesivir day#4/5 Completed IV Solu-Medrol started in the ED. Continue prednisone, weaning off. Continue vitamin C, D3 and zinc. Continue Combivent inhaler every 6  hours Continue antitussives, Tessalon Perles Continue incentive spirometer Continue flutter valve Continue to mobilize as tolerated  Resolved acute hypoxic respiratory failure secondary to COVID-19 viral infection versus mild pulmonary edema Not on oxygen supplementation at baseline Passed her home oxygen evaluation on 12/16/2021, no longer requiring oxygen supplementation.  Presented with oxygen saturation 80s on room air. Continue to treat underlying conditions Continue IV remdesivir Continue to diurese as tolerated Continue bronchodilators Continue pulmonary toilet  Improving, acute on chronic diastolic CHF Presented with gradually worsening dyspnea Mild pulmonary edema seen on chest x-ray 2+ pitting edema in lower extremities bilaterally BNP greater than 400 No longer volume overload on exam. Last 2D echo done on 09/10/2019 showed LVEF 60 to 65%  Limited 2D echo, completed on 12/16/2021 LVEF 60 to 65% no regional wall motion abnormality, mild RV strain by echocardiogram. She received diuresing.  Now euvolemic, cardiology held Lasix.  Recommended to restart Lasix at discharge. Appreciate cardiology's assistance. Continue strict I's and O's and daily weight. Net I&O -2.7 L  Essential hypertension, BP not at goal She is currently on p.o. Lasix 10 mg twice daily, p.o. hydralazine 25 mg twice daily, Toprol-XL 25 mg daily and verapamil 240 mg daily, lisinopril 20 mg daily. Closely monitor vital signs and avoid hypotension  Resolved anemia of chronic disease Hemoglobin 11.0> 12.4 on 12/16/2021. Hemoglobin stable 12.2 on 12/17/2021.  CKD 3B Baseline creatinine appears to be 1.1 with GFR of 45  She is currently back to her baseline creatinine.   Continue to monitor urine output with strict I's and O's Continue to avoid nephrotoxic agents and hypotension  Hypothyroidism Stable Continue home levothyroxine  GERD Continue home Protonix  and Pepcid  Hyperlipidemia Home  pravastatin  Chronic anxiety/depression Stable Continue home Prozac  Physical debility/ambulatory dysfunction Assessed by OT recommendation for home health OT Continue PT OT with assistance and fall precautions. TOC assisting with home health services arrangements.  Anticipate discharge date 12/18/2021.  Chronic constipation Continue Senokot 2 tablets twice daily MiraLAX 17 g daily as needed   Code Status: Full code  Family Communication: None at bedside  Disposition Plan: Likely will discharge back to SNF on 12/18/2021.   Consultants: Cardiology  Procedures: Limited 2D echo 12/15/2021  Antimicrobials: Antiviral IV remdesivir.  DVT prophylaxis: Subcu Lovenox daily  Status is: Inpatient  Patient requires at least 2 midnights for further evaluation and treatment of present condition.      Objective: Vitals:   12/16/21 2109 12/17/21 0500 12/17/21 0514 12/17/21 1249  BP: 112/73  (!) 176/88 (!) 171/80  Pulse: (!) 59  78 87  Resp: 20  15 16   Temp: 97.6 F (36.4 C)  98.9 F (37.2 C) 97.8 F (36.6 C)  TempSrc: Oral  Oral Oral  SpO2: 98%  93% 98%  Weight:  98.2 kg    Height:        Intake/Output Summary (Last 24 hours) at 12/17/2021 1447 Last data filed at 12/16/2021 2249 Gross per 24 hour  Intake 220 ml  Output 2 ml  Net 218 ml   Filed Weights   12/15/21 1055 12/15/21 2059 12/17/21 0500  Weight: 93 kg 97.5 kg 98.2 kg    Exam:  General: 87 y.o. year-old female well-developed well-nourished in no acute distress.  She is alert and interactive.   Cardiovascular: Regular rate and rhythm no rubs or gallops. Respiratory: Clear to auscultation with no wheezes or rales. Abdomen: Soft nontender normal bowel sounds present.   Musculoskeletal: Trace lower extremity edema bilaterally. Skin: No ulcerative lesions noted.  Mild hyperpigmentation involving bilateral lower extremities.  Psychiatry: Mood is appropriate for condition and setting.   Data  Reviewed: CBC: Recent Labs  Lab 12/14/21 1703 12/15/21 0240 12/16/21 0652 12/17/21 0617  WBC 8.8 6.6 11.4* 8.8  NEUTROABS 6.2 6.2  --   --   HGB 11.8* 11.0* 12.4 12.2  HCT 38.3 36.2 40.2 39.0  MCV 93.0 93.1 91.8 91.5  PLT 178 163 188 128   Basic Metabolic Panel: Recent Labs  Lab 12/14/21 1703 12/14/21 1845 12/15/21 0240 12/15/21 1627 12/16/21 0652 12/17/21 0617  NA 135  --  137 136 136 137  K 3.4*  --  4.3 4.2 4.3 4.3  CL 104  --  102 103 101 101  CO2 22  --  26 25 28 27   GLUCOSE 137*  --  156* 104* 127* 132*  BUN 13  --  15 20 27* 34*  CREATININE 1.14*  --  1.28* 1.17* 1.23* 1.16*  CALCIUM 9.7  --  9.6 9.5 9.4 9.8  MG  --  1.6* 2.2  --  2.1 1.9  PHOS  --   --  3.4  --  3.9 3.8   GFR: Estimated Creatinine Clearance: 37.4 mL/min (A) (by C-G formula based on SCr of 1.16 mg/dL (H)). Liver Function Tests: Recent Labs  Lab 12/14/21 1703 12/15/21 0240 12/16/21 0652  AST 16 15 25   ALT 11 10 13   ALKPHOS 30* 28* 32*  BILITOT 0.7 0.5 0.5  PROT 7.1 6.9 7.2  ALBUMIN 3.8 3.5 3.7   No results for input(s): LIPASE, AMYLASE in the last 168 hours. No results for input(s): AMMONIA in the  last 168 hours. Coagulation Profile: No results for input(s): INR, PROTIME in the last 168 hours. Cardiac Enzymes: No results for input(s): CKTOTAL, CKMB, CKMBINDEX, TROPONINI in the last 168 hours. BNP (last 3 results) Recent Labs    08/17/21 1249  PROBNP 260.0*   HbA1C: No results for input(s): HGBA1C in the last 72 hours. CBG: No results for input(s): GLUCAP in the last 168 hours. Lipid Profile: Recent Labs    12/14/21 1703  TRIG 82   Thyroid Function Tests: No results for input(s): TSH, T4TOTAL, FREET4, T3FREE, THYROIDAB in the last 72 hours. Anemia Panel: Recent Labs    12/14/21 1703  FERRITIN 81   Urine analysis:    Component Value Date/Time   COLORURINE YELLOW 07/13/2009 1040   APPEARANCEUR CLOUDY (A) 07/13/2009 1040   LABSPEC 1.026 07/13/2009 1040   PHURINE  5.5 07/13/2009 1040   GLUCOSEU NEGATIVE 07/13/2009 1040   HGBUR NEGATIVE 07/13/2009 1040   BILIRUBINUR negative 09/01/2019 1317   KETONESUR NEGATIVE 07/13/2009 1040   PROTEINUR Negative 09/01/2019 1317   PROTEINUR NEGATIVE 07/13/2009 1040   UROBILINOGEN 0.2 09/01/2019 1317   UROBILINOGEN 0.2 07/13/2009 1040   NITRITE negative 09/01/2019 1317   NITRITE NEGATIVE 07/13/2009 1040   LEUKOCYTESUR Small (1+) (A) 09/01/2019 1317   Sepsis Labs: @LABRCNTIP (procalcitonin:4,lacticidven:4)  ) Recent Results (from the past 240 hour(s))  Blood Culture (routine x 2)     Status: None (Preliminary result)   Collection Time: 12/14/21  4:50 PM   Specimen: BLOOD  Result Value Ref Range Status   Specimen Description   Final    BLOOD LEFT ANTECUBITAL Performed at [facility] [place] [address] [place] [address], [place], [place] [address]    Special Requests   Final    BOTTLES DRAWN AEROBIC AND ANAEROBIC Blood Culture results may not be optimal due to an inadequate volume of blood received in culture bottles Performed at [place] [address] [place], [place] [address]    Culture   Final    NO GROWTH 3 DAYS Performed at [facility], [place] [address] [place], [place] [address]    Report Status PENDING  Incomplete  Blood Culture (routine x 2)     Status: None (Preliminary result)   Collection Time: 12/14/21  4:54 PM   Specimen: BLOOD  Result Value Ref Range Status   Specimen Description   Final    BLOOD RIGHT ANTECUBITAL Performed at [place] [address] [place], [place] [address]    Special Requests   Final    BOTTLES DRAWN AEROBIC AND ANAEROBIC Blood Culture adequate volume Performed at [place] [address] [place], [place] [address]    Culture   Final    NO GROWTH 3 DAYS Performed at [facility], [place] [address], [place],  [address]    Report Status PENDING  Incomplete  Resp Panel by  RT-PCR (Flu A&B, Covid) Nasopharyngeal Swab     Status: Abnormal   Collection Time: 12/14/21  5:03 PM   Specimen: Nasopharyngeal Swab; Nasopharyngeal(NP) swabs in vial transport medium  Result Value Ref Range Status   SARS Coronavirus 2 by RT PCR POSITIVE (A) NEGATIVE Final    Comment: RESULT CALLED TO, READ BACK BY AND VERIFIED WITH: ELLWANGER,A. 12/14/21 @2154  BY SEEL,M. (NOTE) SARS-CoV-2 target nucleic acids are DETECTED.  The SARS-CoV-2 RNA is generally detectable in upper respiratory specimens during the acute phase of infection. Positive results are indicative of the presence of  the identified virus, but do not rule out bacterial infection or co-infection with other pathogens not detected by the test. Clinical correlation with patient history and other diagnostic information is necessary to determine patient infection status. The expected result is Negative.  Fact Sheet for Patients: EntrepreneurPulse.com.au  Fact Sheet for Healthcare Providers: IncredibleEmployment.be  This test is not yet approved or cleared by the [place] FDA and  has been authorized for detection and/or diagnosis of SARS-CoV-2 by FDA under an Emergency Use Authorization (EUA).  This EUA will remain in effect (meaning this test c an be used) for the duration of  the COVID-19 declaration under Section 564(b)(1) of the Act, 21 U.S.C. section 360bbb-3(b)(1), unless the authorization is terminated or revoked sooner.     Influenza A by PCR NEGATIVE NEGATIVE Final   Influenza B by PCR NEGATIVE NEGATIVE Final    Comment: (NOTE) The Xpert Xpress SARS-CoV-2/FLU/RSV plus assay is intended as an aid in the diagnosis of influenza from Nasopharyngeal swab specimens and should not be used as a sole basis for treatment. Nasal washings and aspirates are unacceptable for Xpert Xpress SARS-CoV-2/FLU/RSV testing.  Fact Sheet for  Patients: EntrepreneurPulse.com.au  Fact Sheet for Healthcare Providers: IncredibleEmployment.be  This test is not yet approved or cleared by the [place] FDA and has been authorized for detection and/or diagnosis of SARS-CoV-2 by FDA under an Emergency Use Authorization (EUA). This EUA will remain in effect (meaning this test can be used) for the duration of the COVID-19 declaration under Section 564(b)(1) of the Act, 21 U.S.C. section 360bbb-3(b)(1), unless the authorization is terminated or revoked.  Performed at [facility], [place] [address] [place], [place] [address]       Studies: No results found.  Scheduled Meds:  vitamin C  500 mg Oral Daily   benzonatate  200 mg Oral TID   cholecalciferol  1,000 Units Oral Daily   enoxaparin (LOVENOX) injection  40 mg Subcutaneous Daily   famotidine  20 mg Oral Daily   FLUoxetine  40 mg Oral Daily   hydrALAZINE  25 mg Oral BID   Ipratropium-Albuterol  1 puff Inhalation Q6H   levothyroxine  88 mcg Oral Q0600   lisinopril  20 mg Oral Daily   loratadine  10 mg Oral Daily   metoprolol succinate  25 mg Oral Daily   pantoprazole  40 mg Oral Daily   pravastatin  40 mg Oral Daily   predniSONE  50 mg Oral QHS   senna-docusate  2 tablet Oral BID   verapamil  240 mg Oral Daily   zinc sulfate  220 mg Oral Daily    Continuous Infusions:  remdesivir 100 mg in NS 100 mL 100 mg (12/17/21 1108)     LOS: 3 days     Kayleen Memos, MD [facility] Pager [phone]  If 7PM-7AM, please contact night-coverage [email or web address] Password [facility] 12/17/2021, 2:47 PM

## 2021-12-17 NOTE — Plan of Care (Signed)
  Problem: Education: Goal: Knowledge of risk factors and measures for prevention of condition will improve Outcome: Progressing   Problem: Coping: Goal: Psychosocial and spiritual needs will be supported Outcome: Progressing   

## 2021-12-17 NOTE — Plan of Care (Signed)
  Problem: Education: Goal: Knowledge of risk factors and measures for prevention of condition will improve Outcome: Progressing   Problem: Coping: Goal: Psychosocial and spiritual needs will be supported Outcome: Progressing   Problem: Respiratory: Goal: Will maintain a patent airway Outcome: Progressing Goal: Complications related to the disease process, condition or treatment will be avoided or minimized Outcome: Progressing   

## 2021-12-17 NOTE — Progress Notes (Signed)
Occupational Therapy Treatment Patient Details Name: [name] MRN: [id number] DOB: Aug 12, 1930 Today's Date: 12/17/2021   History of present illness Pt is an 87 year old woman admitted from [facility] with COVID 19 infection and acute hypoxic respiratory failure. PMH: CHF, HTN, HLD, hypothyroidism, allergic rhinitis.   OT comments  Patient is a pleasant 87 year old female who completed toileting tasks with SUP on this date at [place] level. Patient was educated on importance of keeping skin dry to prevent breakdown. Patient verbalized understanding. Patient is motivated to get back home asking multiple times during session if she can go home today. Patient's discharge plan remains appropriate at this time. OT will continue to follow acutely.     Recommendations for follow up therapy are one component of a multi-disciplinary discharge planning process, led by the attending physician.  Recommendations may be updated based on patient status, additional functional criteria and insurance authorization.    Follow Up Recommendations  Home health OT    Assistance Recommended at Discharge Frequent or constant Supervision/Assistance  Equipment Recommendations  None recommended by OT    Recommendations for Other Services      Precautions / Restrictions Precautions Precautions: Fall Restrictions Weight Bearing Restrictions: No       Mobility Bed Mobility Overal bed mobility: Modified Independent                  Transfers Overall transfer level: Needs assistance Equipment used: Rolling walker (2 wheels) Transfers: Sit to/from Stand Sit to Stand: Supervision                 Balance Overall balance assessment: Needs assistance;History of Falls   Sitting balance-Leahy Scale: Good     Standing balance support: During functional activity Standing balance-Leahy Scale: Fair Standing balance comment: standing at sink to wash hands                            ADL either performed or assessed with clinical judgement   ADL Overall ADL's : Needs assistance/impaired     Grooming: Wash/dry hands;Standing;Supervision/safety Grooming Details (indicate cue type and reason): at sink         Upper Body Dressing : Sitting;Set up       Toilet Transfer: Ambulation;Rolling walker (2 wheels);Grab bars;Supervision/safety Toilet Transfer Details (indicate cue type and reason): with increased time Toileting- Clothing Manipulation and Hygiene: Sitting/lateral lean Toileting - Clothing Manipulation Details (indicate cue type and reason): independent with pericare leaning to side. patient reproted she sits to complete hygiene tasks. was able to complete MI.     Functional mobility during ADLs: Supervision/safety;Rolling walker (2 wheels) General ADL Comments: at start of session patient was in bed reporting need to use bathroom. patients call light was not on upon entrance to room. patient was noted to be saturated as well. patient was educated on importance of using call light to let staff know she was wet as soon as she  knows to preseve skin inegrity and prevent UTI. patient verbalized understanding. nurse made aware.    Extremity/Trunk Assessment              Vision       Perception     Praxis      Cognition Arousal/Alertness: Awake/alert Behavior During Therapy: WFL for tasks assessed/performed Overall Cognitive Status: Within Functional Limits for tasks assessed  Exercises     Shoulder Instructions       General Comments      Pertinent Vitals/ Pain       Pain Assessment: No/denies pain  Home Living                                          Prior Functioning/Environment              Frequency  Min 2X/week        Progress Toward Goals  OT Goals(current goals can now be found in the care plan section)  Progress towards OT goals:  Progressing toward goals     Plan Discharge plan remains appropriate    Co-evaluation                 AM-PAC OT "6 Clicks" Daily Activity     Outcome Measure   Help from another person eating meals?: None Help from another person taking care of personal grooming?: None Help from another person toileting, which includes using toliet, bedpan, or urinal?: A Little Help from another person bathing (including washing, rinsing, drying)?: A Little Help from another person to put on and taking off regular upper body clothing?: None Help from another person to put on and taking off regular lower body clothing?: A Little 6 Click Score: 21    End of Session Equipment Utilized During Treatment: Gait belt;Rolling walker (2 wheels)  OT Visit Diagnosis: Unsteadiness on feet (R26.81);Other abnormalities of gait and mobility (R26.89);Muscle weakness (generalized) (M62.81)   Activity Tolerance Patient tolerated treatment well   Patient Left in chair;with call bell/phone within reach;with chair alarm set   Nurse Communication Mobility status        Time: 9407-6808 OT Time Calculation (min): 24 min  Charges: OT General Charges $OT Visit: 1 Visit OT Treatments $Self Care/Home Management : 23-37 mins  Jackelyn Poling OTR/L, MS Acute Rehabilitation Department Office# [phone] Pager# [phone]   Marcellina Millin 12/17/2021, 1:23 PM

## 2021-12-17 NOTE — Progress Notes (Addendum)
Progress Note  Patient Name: [name] Date of Encounter: 12/17/2021  [facility] Cardiologist: Quay Burow, MD   Subjective   Breathing is much improved.  No chest pain.  Inpatient Medications    Scheduled Meds:  vitamin C  500 mg Oral Daily   benzonatate  200 mg Oral TID   cholecalciferol  1,000 Units Oral Daily   enoxaparin (LOVENOX) injection  40 mg Subcutaneous Daily   famotidine  20 mg Oral Daily   FLUoxetine  40 mg Oral Daily   furosemide  20 mg Intravenous BID   furosemide  10 mg Oral BID   hydrALAZINE  25 mg Oral BID   Ipratropium-Albuterol  1 puff Inhalation Q6H   levothyroxine  88 mcg Oral Q0600   lisinopril  20 mg Oral Daily   loratadine  10 mg Oral Daily   metoprolol succinate  25 mg Oral Daily   pantoprazole  40 mg Oral Daily   potassium chloride  40 mEq Oral Daily   pravastatin  40 mg Oral Daily   predniSONE  50 mg Oral QHS   senna-docusate  2 tablet Oral BID   verapamil  240 mg Oral Daily   zinc sulfate  220 mg Oral Daily   Continuous Infusions:  remdesivir 100 mg in NS 100 mL 100 mg (12/16/21 0833)   PRN Meds: acetaminophen **OR** acetaminophen, albuterol, polyethylene glycol   Vital Signs    Vitals:   12/16/21 1747 12/16/21 2109 12/17/21 0500 12/17/21 0514  BP: 140/65 112/73  (!) 176/88  Pulse: 68 (!) 59  78  Resp:  20  15  Temp:  97.6 F (36.4 C)  98.9 F (37.2 C)  TempSrc:  Oral  Oral  SpO2:  98%  93%  Weight:   98.2 kg   Height:        Intake/Output Summary (Last 24 hours) at 12/17/2021 1009 Last data filed at 12/16/2021 2249 Gross per 24 hour  Intake 220 ml  Output 2 ml  Net 218 ml   Last 3 Weights 12/17/2021 12/15/2021 12/15/2021  Weight (lbs) 216 lb 7.9 oz 214 lb 15.2 oz 205 lb  Weight (kg) 98.2 kg 97.5 kg 92.987 kg      Telemetry    Normal sinus rhythm- Personally Reviewed  ECG    N/A  Physical Exam   GEN: No acute distress.   Neck: No JVD Cardiac: RRR, no murmurs, rubs, or gallops.   Respiratory: Clear to auscultation bilaterally. GI: Soft, nontender, non-distended  MS: No edema; No deformity. Neuro:  Nonfocal  Psych: Normal affect   Labs    High Sensitivity Troponin:   Recent Labs  Lab 12/14/21 1703 12/14/21 1845  TROPONINIHS 24* 23*     Chemistry Recent Labs  Lab 12/14/21 1703 12/14/21 1845 12/15/21 0240 12/15/21 1627 12/16/21 0652 12/17/21 0617  NA 135  --  137 136 136 137  K 3.4*  --  4.3 4.2 4.3 4.3  CL 104  --  102 103 101 101  CO2 22  --  26 25 28 27   GLUCOSE 137*  --  156* 104* 127* 132*  BUN 13  --  15 20 27* 34*  CREATININE 1.14*  --  1.28* 1.17* 1.23* 1.16*  CALCIUM 9.7  --  9.6 9.5 9.4 9.8  MG  --    < > 2.2  --  2.1 1.9  PROT 7.1  --  6.9  --  7.2  --   ALBUMIN 3.8  --  3.5  --  3.7  --   AST 16  --  15  --  25  --   ALT 11  --  10  --  13  --   ALKPHOS 30*  --  28*  --  32*  --   BILITOT 0.7  --  0.5  --  0.5  --   GFRNONAA 46*  --  40* 45* 42* 45*  ANIONGAP 9  --  9 8 7 9    < > = values in this interval not displayed.    Lipids  Recent Labs  Lab 12/14/21 1703  TRIG 82    Hematology Recent Labs  Lab 12/15/21 0240 12/16/21 0652 12/17/21 0617  WBC 6.6 11.4* 8.8  RBC 3.89 4.38 4.26  HGB 11.0* 12.4 12.2  HCT 36.2 40.2 39.0  MCV 93.1 91.8 91.5  MCH 28.3 28.3 28.6  MCHC 30.4 30.8 31.3  RDW 13.7 13.2 13.3  PLT 163 188 193   Thyroid No results for input(s): TSH, FREET4 in the last 168 hours.  BNP Recent Labs  Lab 12/14/21 1703  BNP 441.7*    DDimer  Recent Labs  Lab 12/15/21 0240 12/16/21 0652 12/17/21 0617  DDIMER 0.97* 0.98* 0.76*     Radiology    ECHOCARDIOGRAM LIMITED  Result Date: 12/16/2021    ECHOCARDIOGRAM LIMITED REPORT   Patient Name:   [name] Date of Exam: 12/16/2021 Medical Rec #:  [id number]      Height:       64.0 in Accession #:    [id number]     Weight:       214.9 lb Date of Birth:  1930-08-25       BSA:          2.017 m Patient Age:    87 years       BP:           179/110 mmHg  Patient Gender: F              HR:           98 bpm. Exam Location:  Inpatient Procedure: Limited Echo, Color Doppler and Cardiac Doppler Indications:    I96.78 Acute diastolic (congestive) heart failure  History:        Patient has prior history of Echocardiogram examinations, most                 recent 09/10/2019. Risk Factors:Hypertension and Dyslipidemia.  Sonographer:    Raquel Sarna Senior RDCS Referring Phys: [id number] [place]  Sonographer Comments: COVID+ at time of study IMPRESSIONS  1. Right ventricular systolic function basal function is normal. There appears to be mild mid wall hypokinesis suggestive of RV strain.  2. Left ventricular ejection fraction, by estimation, is 60 to 65%. The left ventricle has normal function. The left ventricle has no regional wall motion abnormalities.  3. The mitral valve is grossly normal. Mild mitral valve regurgitation.  4. The aortic valve was not well visualized. Aortic valve regurgitation is not visualized. Aortic valve sclerosis is present, with no evidence of aortic valve stenosis.  5. The inferior vena cava is dilated in size with <50% respiratory variability, suggesting right atrial pressure of 15 mmHg. Comparison(s): A prior study was performed on 09/09/21. RV strain findings new from prior. FINDINGS  Left Ventricle: Left ventricular ejection fraction, by estimation, is 60 to 65%. The left ventricle has normal function. The left ventricle has no regional wall motion abnormalities. Right  Ventricle: Right ventricular systolic function is normal. Pericardium: Trivial pericardial effusion is present. Presence of epicardial fat layer. Mitral Valve: The mitral valve is grossly normal. Mild mitral valve regurgitation. Tricuspid Valve: The tricuspid valve is normal in structure. Tricuspid valve regurgitation is not demonstrated. No evidence of tricuspid stenosis. Aortic Valve: The aortic valve was not well visualized. Aortic valve regurgitation is not visualized. Aortic  valve sclerosis is present, with no evidence of aortic valve stenosis. Venous: The inferior vena cava is dilated in size with less than 50% respiratory variability, suggesting right atrial pressure of 15 mmHg. RIGHT VENTRICLE RV S prime:     18.30 cm/s TAPSE (M-mode): 3.0 cm AORTIC VALVE LVOT Vmax:   89.40 cm/s LVOT Vmean:  60.100 cm/s LVOT VTI:    0.199 m  SHUNTS Systemic VTI: 0.20 m Rudean Haskell MD Electronically signed by Rudean Haskell MD Signature Date/Time: 12/16/2021/1:51:06 PM    Final     Cardiac Studies   Echo 12/16/2021 1. Right ventricular systolic function basal function is normal. There  appears to be mild mid wall hypokinesis suggestive of RV strain.   2. Left ventricular ejection fraction, by estimation, is 60 to 65%. The  left ventricle has normal function. The left ventricle has no regional  wall motion abnormalities.   3. The mitral valve is grossly normal. Mild mitral valve regurgitation.   4. The aortic valve was not well visualized. Aortic valve regurgitation  is not visualized. Aortic valve sclerosis is present, with no evidence of  aortic valve stenosis.   5. The inferior vena cava is dilated in size with <50% respiratory  variability, suggesting right atrial pressure of 15 mmHg.   Comparison(s): A prior study was performed on 09/09/21. RV strain findings  new from prior.   Patient Profile     87 y.o. female chronic diastolic heart failure, hypertension, hyperlipidemia, hypothyroidism who is admitted for acute hypoxic respiratory failure secondary to COVID-19 infection and possible heart failure exacerbation  Assessment & Plan    1.  Acute on chronic diastolic heart failure 2.  Acute hypoxic respiratory failure -Her dyspnea felt more likely related to COVID-19 infection rather than heart failure exacerbation by Dr. Sallyanne Kuster on consult note yesterday. -Echocardiogram this admission showed LV function of 60 to 65%.  No regional wall motion abnormality.   There was mild RV strain by echocardiogram. -Patient diuresed -2.7 L so far -Weight 216 today (was 205 on admit 2 days ago).  Doubt accurate -Unfortunately patient is on IV Lasix 20 mg twice daily as well as p.o. Lasix 10 mg twice daily.  She has got IV Lasix 20 mg this morning.  On exam patient appears euvolemic. -Will stop or adjust diuretics after evaluation by Dr. Stanford Breed -Continue Toprol-XL 25 mg daily, lisinopril 20 mg daily and hydralazine 25 mg twice daily  3.  Hypertension -Blood pressure elevated -Titrate medication as needed  For questions or updates, please contact [place] Please consult [email or web address] for contact info under   Signed, Leanor Kail, PA  12/17/2021, 10:09 AM     As above, patient seen and examined.  Her dyspnea has improved.  She denies chest pain.  She is not volume overloaded on examination.  Presentation likely due to COVID and diastolic congestive heart failure has improved.  We will hold Lasix and potassium today.  Note LV function is normal on her echocardiogram.  Continue therapy for COVID per primary care.  At discharge would resume home dose of Lasix.  Continue preadmission medications  from cardiac standpoint.  We will sign off.  Please call with questions.  We will arrange follow-up in approximately 3 months in our office. Kirk Ruths, MD

## 2021-12-18 ENCOUNTER — Other Ambulatory Visit (HOSPITAL_COMMUNITY): Payer: Self-pay

## 2021-12-18 LAB — BASIC METABOLIC PANEL
Anion gap: 8 (ref 5–15)
BUN: 36 mg/dL — ABNORMAL HIGH (ref 8–23)
CO2: 28 mmol/L (ref 22–32)
Calcium: 9.5 mg/dL (ref 8.9–10.3)
Chloride: 101 mmol/L (ref 98–111)
Creatinine, Ser: 1.3 mg/dL — ABNORMAL HIGH (ref 0.44–1.00)
GFR, Estimated: 39 mL/min — ABNORMAL LOW (ref >60)
Glucose, Bld: 133 mg/dL — ABNORMAL HIGH (ref 70–99)
Potassium: 4.2 mmol/L (ref 3.5–5.1)
Sodium: 137 mmol/L (ref 135–145)

## 2021-12-18 LAB — CBC
HCT: 38.8 % (ref 36.0–46.0)
Hemoglobin: 12 g/dL (ref 12.0–15.0)
MCH: 28.5 pg (ref 26.0–34.0)
MCHC: 30.9 g/dL (ref 30.0–36.0)
MCV: 92.2 fL (ref 80.0–100.0)
Platelets: 180 10*3/uL (ref 150–400)
RBC: 4.21 MIL/uL (ref 3.87–5.11)
RDW: 13.2 % (ref 11.5–15.5)
WBC: 7.6 10*3/uL (ref 4.0–10.5)
nRBC: 0 % (ref 0.0–0.2)

## 2021-12-18 MED ORDER — BENZONATATE 200 MG PO CAPS
200.0000 mg | ORAL_CAPSULE | Freq: Three times a day (TID) | ORAL | 0 refills | Status: DC
  Filled 2021-12-18: qty 20, 7d supply, fill #0

## 2021-12-18 MED ORDER — ASCORBIC ACID 500 MG PO TABS
500.0000 mg | ORAL_TABLET | Freq: Every day | ORAL | 0 refills | Status: AC
  Filled 2021-12-18: qty 90, 90d supply, fill #0

## 2021-12-18 MED ORDER — POLYETHYLENE GLYCOL 3350 17 G PO PACK
17.0000 g | PACK | Freq: Every day | ORAL | 0 refills | Status: DC | PRN
  Filled 2021-12-18: qty 14, 14d supply, fill #0

## 2021-12-18 MED ORDER — ZINC SULFATE 220 (50 ZN) MG PO CAPS
220.0000 mg | ORAL_CAPSULE | Freq: Every day | ORAL | 0 refills | Status: AC
  Filled 2021-12-18: qty 90, 90d supply, fill #0

## 2021-12-18 MED ORDER — ALBUTEROL SULFATE HFA 108 (90 BASE) MCG/ACT IN AERS
1.0000 | INHALATION_SPRAY | Freq: Four times a day (QID) | RESPIRATORY_TRACT | 0 refills | Status: DC | PRN
  Filled 2021-12-18: qty 8.5, 30d supply, fill #0

## 2021-12-18 MED ORDER — SODIUM CHLORIDE 0.9 % IV SOLN
100.0000 mg | INTRAVENOUS | Status: AC
  Administered 2021-12-18: 11:00:00 100 mg via INTRAVENOUS
  Filled 2021-12-18: qty 20

## 2021-12-18 NOTE — TOC Progression Note (Signed)
Transition of Care [facility]) - Progression Note    Patient Details  Name: [name] MRN: [id number] Date of Birth: 1930/07/04  Transition of Care [facility] CM/SW Contact  Purcell Mouton, RN Phone Number: 12/18/2021, 12:25 PM  Clinical Narrative:    Spoke with pt's granddaughter Nonnie Done was selected for HHPT/OT. Referral given to in house rep.    Expected Discharge Plan: [place] Barriers to Discharge: No Barriers Identified  Expected Discharge Plan and Services Expected Discharge Plan: [place] arrangements for the past 2 months: Apartment Expected Discharge Date: 12/18/21                                     Social Determinants of Health (SDOH) Interventions    Readmission Risk Interventions No flowsheet data found.

## 2021-12-18 NOTE — Discharge Summary (Addendum)
Discharge Summary  [name] JAS:[id number] DOB: 1930/06/06  PCP: [name], MD  Admit date: 12/14/2021 Discharge date: 12/18/2021  Time spent: 35 minutes.  Recommendations for Outpatient Follow-up:  Follow-up with your primary care provider Follow-up with your cardiologist Take your medication as prescribed Continue PT OT with assistance and fall precautions  Discharge Diagnoses:  Active Hospital Problems   Diagnosis Date Noted   COVID-19 virus infection 12/14/2021   SOB (shortness of breath) 12/15/2021   Hypokalemia 12/15/2021   Hypomagnesemia 12/15/2021   Allergic rhinitis 12/15/2021   Chronic diastolic CHF (congestive heart failure) ([facility]) 03/12/2020   Hypertension 03/11/2012   Hyperlipidemia 03/11/2012   Hypothyroidism 03/11/2012    Resolved Hospital Problems  No resolved problems to display.    Discharge Condition: Stable  Diet recommendation: Resume previous diet.  Vitals:   12/17/21 2106 12/18/21 0639  BP: (!) 162/76 (!) 182/78  Pulse: 68 69  Resp: 17 16  Temp: 97.9 F (36.6 C) 98.5 F (36.9 C)  SpO2: 98% 95%    History of present illness:  [name] is a 87 y.o. female with medical history significant for chronic diastolic CHF, hypertension, hyperlipidemia, acquired hypothyroidism, allergic rhinitis, who presented to [facility] ED from home due to progressively worsening shortness of breath of 3 days duration.  Associated with a nonproductive cough.  Work-up revealed COVID-19 viral infection, acute hypoxic respiratory failure with O2 saturation in the 80s on room air at presentation, volume overload with a BNP of greater than 400.  She was started on antiviral, IV remdesivir, and IV Solu-Medrol.  Her home oral diuretic Lasix at 10 mg twice daily was resumed.  She received an additional dose of IV Lasix 20 mg on 12/15/2021.  She was seen by cardiology, will arrange follow-up in approximately 3 months in the office.     She passed her home oxygen  evaluation on 12/16/2021, no longer requiring oxygen supplementation.  Seen by PT OT with recommendation for home health PT OT.   12/18/2021: Seen at her bedside.  No acute events overnight.  She has no new complaints.  She is eager to go home.  Hospital Course:  Principal Problem:   COVID-19 virus infection Active Problems:   Hypothyroidism   Hyperlipidemia   Hypertension   Chronic diastolic CHF (congestive heart failure) ([facility])   SOB (shortness of breath)   Hypokalemia   Hypomagnesemia   Allergic rhinitis  COVID-19 viral infection, post completion of antiviral therapy. Personally reviewed chest x-ray done on admission which showed cardiomegaly with mild increase in pulmonary vascularity suggestive of mild pulm edema, no focal infiltrates. Completed course of antiviral, IV Remdesivir Completed IV Solu-Medrol started in the ED. Continue vitamin C, D3 and zinc. Continue bronchodilators as needed Continue antitussives, Tessalon Perles Continue incentive spirometer Continue flutter valve Continue to mobilize as tolerated   Resolved acute hypoxic respiratory failure secondary to COVID-19 viral infection versus mild pulmonary edema Not on oxygen supplementation at baseline Passed her home oxygen evaluation on 12/16/2021, no longer requiring oxygen supplementation.  Presented with oxygen saturation 80s on room air. Completed course of IV remdesivir Continue bronchodilators as needed Continue pulmonary toilet as needed   Resolved acute on chronic diastolic CHF Presented with gradually worsening dyspnea Mild pulmonary edema seen on chest x-ray 2+ pitting edema in lower extremities bilaterally BNP greater than 400 No longer volume overload on exam. Last 2D echo done on 09/10/2019 showed LVEF 60 to 65%  Limited 2D echo, completed on 12/16/2021 LVEF 60 to  65% no regional wall motion abnormality, mild RV strain by echocardiogram. She received diuresing.  Now euvolemic, cardiology held  Lasix.  Recommended to restart Lasix at discharge. Continue strict I's and O's and daily weight. Net I&O -3.5 L   Essential hypertension She is currently on p.o. Lasix 10 mg twice daily, p.o. hydralazine 25 mg twice daily, Toprol-XL 25 mg daily and verapamil 240 mg daily, lisinopril 40 mg daily. Follow-up with cardiology.   Resolved anemia of chronic disease Hemoglobin 11.0> 12.4 on 12/16/2021. Hemoglobin stable 12.2 on 12/17/2021.   CKD 3B Baseline creatinine appears to be 1.1 with GFR of 45  Continue to avoid nephrotoxic agents and hypotension Follow-up with your PCP. Repeat BMP in 1 week.   Hypothyroidism Stable Continue home levothyroxine   GERD Continue home Protonix and Pepcid   Hyperlipidemia Home pravastatin   Chronic anxiety/depression Stable Continue home Prozac   Physical debility/ambulatory dysfunction Assessed by OT recommendation for home health OT Continue PT OT with assistance and fall precautions. TOC assisting with home health services arrangements.   Chronic constipation MiraLAX 17 g daily as needed     Code Status: Full code   Family Communication: Updated her granddaughter [name] via phone on 12/18/2021.    Consultants: Cardiology   Procedures: Limited 2D echo 12/15/2021   Antimicrobials: Antiviral IV remdesivir.    Discharge Exam: BP (!) 182/78 (BP Location: Left Arm)    Pulse 69    Temp 98.5 F (36.9 C) (Oral)    Resp 16    Ht 5\' 4"  (1.626 m)    Wt 101.7 kg    SpO2 95%    BMI 38.49 kg/m  General: 87 y.o. year-old female well developed well nourished in no acute distress.  Alert and oriented x3. Cardiovascular: Regular rate and rhythm with no rubs or gallops.  No thyromegaly or JVD noted.   Respiratory: Clear to auscultation with no wheezes or rales. Good inspiratory effort. Abdomen: Soft nontender nondistended with normal bowel sounds x4 quadrants. Musculoskeletal: No lower extremity edema. 2/4 pulses in all 4 extremities. Skin:  No ulcerative lesions noted or rashes, Psychiatry: Mood is appropriate for condition and setting  Discharge Instructions You were cared for by a hospitalist during your hospital stay. If you have any questions about your discharge medications or the care you received while you were in the hospital after you are discharged, you can call the unit and asked to speak with the hospitalist on call if the hospitalist that took care of you is not available. Once you are discharged, your primary care physician will handle any further medical issues. Please note that NO REFILLS for any discharge medications will be authorized once you are discharged, as it is imperative that you return to your primary care physician (or establish a relationship with a primary care physician if you do not have one) for your aftercare needs so that they can reassess your need for medications and monitor your lab values.   Allergies as of 12/18/2021   No Known Allergies      Medication List     TAKE these medications    acetaminophen 500 MG tablet Commonly known as: TYLENOL Take 1,000 mg by mouth daily.   albuterol 108 (90 Base) MCG/ACT inhaler Commonly known as: VENTOLIN HFA Inhale 1 - 2 puffs into the lungs every 6 hours as needed for wheezing or shortness of breath.   ascorbic acid 500 MG tablet Commonly known as: VITAMIN C Take 1 tablet by mouth daily.  benzonatate 200 MG capsule Commonly known as: TESSALON Take 1 capsule by mouth 3 times daily.   calcium gluconate 500 MG tablet Take 1 tablet (500 mg total) by mouth daily.   cholecalciferol 1000 units tablet Commonly known as: VITAMIN D Take 1,000 Units by mouth daily.   denosumab 60 MG/ML Sosy injection Commonly known as: PROLIA Inject 60 mg into the skin every 6 (six) months.   diphenhydramine-acetaminophen 25-500 MG Tabs tablet Commonly known as: TYLENOL PM Take 2 tablets by mouth at bedtime as needed (sleep).   docusate sodium 100 MG  capsule Commonly known as: COLACE Take 100 mg by mouth 2 (two) times daily.   famotidine 20 MG tablet Commonly known as: PEPCID Take 20 mg by mouth daily.   FLUoxetine 40 MG capsule Commonly known as: PROZAC TAKE 1 CAPSULE EVERY DAY  (REPLACES  TABLETS) What changed: See the new instructions.   furosemide 20 MG tablet Commonly known as: LASIX Take 0.5 tablets (10 mg total) by mouth 2 (two) times daily.   hydrALAZINE 25 MG tablet Commonly known as: APRESOLINE Take 1 tablet (25 mg total) by mouth in the morning and at bedtime.   hydrOXYzine 10 MG tablet Commonly known as: ATARAX Take 0.5-1 tablets (5-10 mg total) by mouth 2 (two) times daily as needed for anxiety.   levothyroxine 88 MCG tablet Commonly known as: SYNTHROID TAKE 1 TABLET EVERY DAY BEFORE BREAKFAST What changed: See the new instructions.   lisinopril 40 MG tablet Commonly known as: ZESTRIL TAKE 1 TABLET EVERY DAY (NEW DOSE) What changed: See the new instructions.   loratadine 10 MG tablet Commonly known as: CLARITIN TAKE 1 TABLET EVERY EVENING What changed: when to take this   metoprolol succinate 25 MG 24 hr tablet Commonly known as: TOPROL-XL Take 1 tablet (25 mg total) by mouth daily.   Myrbetriq 25 MG Tb24 tablet Generic drug: mirabegron ER TAKE 1 TABLET EVERY DAY What changed:  how much to take how to take this   omeprazole 40 MG capsule Commonly known as: PRILOSEC TAKE 1 CAPSULE EVERY DAY What changed: how to take this   polyethylene glycol 17 g packet Commonly known as: MIRALAX / GLYCOLAX Take 17 g by mouth daily as needed for mild constipation.   pravastatin 40 MG tablet Commonly known as: PRAVACHOL TAKE 1 TABLET EVERY DAY   SYSTANE BALANCE OP Place 1 drop into both eyes daily as needed (dry eyes).   verapamil 240 MG CR tablet Commonly known as: CALAN-SR Take 1 tablet (240 mg total) by mouth daily.   vitamin B-12 500 MCG tablet Commonly known as: CYANOCOBALAMIN Take 1 tablet  (500 mcg total) by mouth once a week. What changed: additional instructions   zinc sulfate 220 (50 Zn) MG capsule Take 1 capsule (220 mg total) by mouth daily.       No Known Allergies  Follow-up Information     Lorretta Harp, MD Follow up on 03/13/2022.   Specialties: Cardiology, Radiology Why: @10 :30am for hospital follow up Contact information: [address] [place] [address] [phone]         [name], MD. Call today.   Specialty: Family Medicine Why: Please call for a posthospital follow-up appointment. Contact information: [place] [address] [phone]         Lorretta Harp, MD .   Specialties: Cardiology, Radiology Contact information: [address] [place] [address] [phone]  [facility] Follow up.   Specialty: Home Health Services Why: Will follow you at home with Home Health. Please call Alvis Lemmings if you have any questions or concerns. Contact information: [place] [address] [phone]                  The results of significant diagnostics from this hospitalization (including imaging, microbiology, ancillary and laboratory) are listed below for reference.    Significant Diagnostic Studies: DG Chest Port 1 View  Result Date: 12/14/2021 CLINICAL DATA:  Shortness of breath EXAM: PORTABLE CHEST 1 VIEW COMPARISON:  03/12/2020 FINDINGS: Transverse diameter of heart is increased. There are no signs of alveolar pulmonary edema. Central pulmonary arteries are prominent, possibly suggesting pulmonary arterial hypertension. There are no signs of focal pulmonary consolidation. Left hemidiaphragm is elevated. There is no significant pleural effusion or pneumothorax. IMPRESSION: Cardiomegaly. Central pulmonary vessels are prominent without signs of alveolar pulmonary edema. There is no focal pulmonary consolidation.  Electronically Signed   By: Elmer Picker M.D.   On: 12/14/2021 17:36   ECHOCARDIOGRAM LIMITED  Result Date: 12/16/2021    ECHOCARDIOGRAM LIMITED REPORT   Patient Name:   [name] Date of Exam: 12/16/2021 Medical Rec #:  [id number]      Height:       64.0 in Accession #:    [id number]     Weight:       214.9 lb Date of Birth:  December 01, 1930       BSA:          2.017 m Patient Age:    87 years       BP:           179/110 mmHg Patient Gender: F              HR:           98 bpm. Exam Location:  Inpatient Procedure: Limited Echo, Color Doppler and Cardiac Doppler Indications:    G64.40 Acute diastolic (congestive) heart failure  History:        Patient has prior history of Echocardiogram examinations, most                 recent 09/10/2019. Risk Factors:Hypertension and Dyslipidemia.  Sonographer:    Raquel Sarna Senior RDCS Referring Phys: [id number] [place]  Sonographer Comments: COVID+ at time of study IMPRESSIONS  1. Right ventricular systolic function basal function is normal. There appears to be mild mid wall hypokinesis suggestive of RV strain.  2. Left ventricular ejection fraction, by estimation, is 60 to 65%. The left ventricle has normal function. The left ventricle has no regional wall motion abnormalities.  3. The mitral valve is grossly normal. Mild mitral valve regurgitation.  4. The aortic valve was not well visualized. Aortic valve regurgitation is not visualized. Aortic valve sclerosis is present, with no evidence of aortic valve stenosis.  5. The inferior vena cava is dilated in size with <50% respiratory variability, suggesting right atrial pressure of 15 mmHg. Comparison(s): A prior study was performed on 09/09/21. RV strain findings new from prior. FINDINGS  Left Ventricle: Left ventricular ejection fraction, by estimation, is 60 to 65%. The left ventricle has normal function. The left ventricle has no regional wall motion abnormalities. Right Ventricle: Right ventricular systolic function  is normal. Pericardium: Trivial pericardial effusion is present. Presence of epicardial fat layer. Mitral Valve: The mitral valve is grossly normal. Mild mitral valve regurgitation. Tricuspid Valve: The  tricuspid valve is normal in structure. Tricuspid valve regurgitation is not demonstrated. No evidence of tricuspid stenosis. Aortic Valve: The aortic valve was not well visualized. Aortic valve regurgitation is not visualized. Aortic valve sclerosis is present, with no evidence of aortic valve stenosis. Venous: The inferior vena cava is dilated in size with less than 50% respiratory variability, suggesting right atrial pressure of 15 mmHg. RIGHT VENTRICLE RV S prime:     18.30 cm/s TAPSE (M-mode): 3.0 cm AORTIC VALVE LVOT Vmax:   89.40 cm/s LVOT Vmean:  60.100 cm/s LVOT VTI:    0.199 m  SHUNTS Systemic VTI: 0.20 m Rudean Haskell MD Electronically signed by Rudean Haskell MD Signature Date/Time: 12/16/2021/1:51:06 PM    Final     Microbiology: Recent Results (from the past 240 hour(s))  Blood Culture (routine x 2)     Status: None (Preliminary result)   Collection Time: 12/14/21  4:50 PM   Specimen: BLOOD  Result Value Ref Range Status   Specimen Description   Final    BLOOD LEFT ANTECUBITAL Performed at [facility] [address], [place], [place] [address]    Special Requests   Final    BOTTLES DRAWN AEROBIC AND ANAEROBIC Blood Culture results may not be optimal due to an inadequate volume of blood received in culture bottles Performed at [facility] [address], [place], [place] [address]    Culture   Final    NO GROWTH 4 DAYS Performed at [facility], [place] [address], [place], [place] [address]    Report Status PENDING  Incomplete  Blood Culture (routine x 2)     Status: None (Preliminary result)   Collection Time: 12/14/21  4:54 PM   Specimen: BLOOD  Result Value Ref Range Status   Specimen Description   Final    BLOOD RIGHT  ANTECUBITAL Performed at [facility] [address] [place], [place] [address]    Special Requests   Final    BOTTLES DRAWN AEROBIC AND ANAEROBIC Blood Culture adequate volume Performed at [facility] [address] [place], [place] [address]    Culture   Final    NO GROWTH 4 DAYS Performed at [facility], [place] [address], [place], [place] [address]    Report Status PENDING  Incomplete  Resp Panel by RT-PCR (Flu A&B, Covid) Nasopharyngeal Swab     Status: Abnormal   Collection Time: 12/14/21  5:03 PM   Specimen: Nasopharyngeal Swab; Nasopharyngeal(NP) swabs in vial transport medium  Result Value Ref Range Status   SARS Coronavirus 2 by RT PCR POSITIVE (A) NEGATIVE Final    Comment: RESULT CALLED TO, READ BACK BY AND VERIFIED WITH: ELLWANGER,A. 12/14/21 @2154  BY SEEL,M. (NOTE) SARS-CoV-2 target nucleic acids are DETECTED.  The SARS-CoV-2 RNA is generally detectable in upper respiratory specimens during the acute phase of infection. Positive results are indicative of the presence of the identified virus, but do not rule out bacterial infection or co-infection with other pathogens not detected by the test. Clinical correlation with patient history and other diagnostic information is necessary to determine patient infection status. The expected result is Negative.  Fact Sheet for Patients: EntrepreneurPulse.com.au  Fact Sheet for Healthcare Providers: IncredibleEmployment.be  This test is not yet approved or cleared by the [place] FDA and  has been authorized for detection and/or diagnosis of SARS-CoV-2 by FDA under an Emergency Use Authorization (EUA).  This EUA will remain in effect (  meaning this test c an be used) for the duration of  the COVID-19 declaration under Section 564(b)(1) of the Act, 21 U.S.C. section 360bbb-3(b)(1), unless the authorization is terminated or revoked  sooner.     Influenza A by PCR NEGATIVE NEGATIVE Final   Influenza B by PCR NEGATIVE NEGATIVE Final    Comment: (NOTE) The Xpert Xpress SARS-CoV-2/FLU/RSV plus assay is intended as an aid in the diagnosis of influenza from Nasopharyngeal swab specimens and should not be used as a sole basis for treatment. Nasal washings and aspirates are unacceptable for Xpert Xpress SARS-CoV-2/FLU/RSV testing.  Fact Sheet for Patients: EntrepreneurPulse.com.au  Fact Sheet for Healthcare Providers: IncredibleEmployment.be  This test is not yet approved or cleared by the [place] FDA and has been authorized for detection and/or diagnosis of SARS-CoV-2 by FDA under an Emergency Use Authorization (EUA). This EUA will remain in effect (meaning this test can be used) for the duration of the COVID-19 declaration under Section 564(b)(1) of the Act, 21 U.S.C. section 360bbb-3(b)(1), unless the authorization is terminated or revoked.  Performed at [facility], [place] [address], [place], [place] [address]      Labs: Basic Metabolic Panel: Recent Labs  Lab 12/14/21 1845 12/15/21 0240 12/15/21 1627 12/16/21 0652 12/17/21 0617 12/18/21 0416  NA  --  137 136 136 137 137  K  --  4.3 4.2 4.3 4.3 4.2  CL  --  102 103 101 101 101  CO2  --  26 25 28 27 28   GLUCOSE  --  156* 104* 127* 132* 133*  BUN  --  15 20 27* 34* 36*  CREATININE  --  1.28* 1.17* 1.23* 1.16* 1.30*  CALCIUM  --  9.6 9.5 9.4 9.8 9.5  MG 1.6* 2.2  --  2.1 1.9  --   PHOS  --  3.4  --  3.9 3.8  --    Liver Function Tests: Recent Labs  Lab 12/14/21 1703 12/15/21 0240 12/16/21 0652  AST 16 15 25   ALT 11 10 13   ALKPHOS 30* 28* 32*  BILITOT 0.7 0.5 0.5  PROT 7.1 6.9 7.2  ALBUMIN 3.8 3.5 3.7   No results for input(s): LIPASE, AMYLASE in the last 168 hours. No results for input(s): AMMONIA in the last 168 hours. CBC: Recent Labs  Lab 12/14/21 1703 12/15/21 0240  12/16/21 0652 12/17/21 0617 12/18/21 0416  WBC 8.8 6.6 11.4* 8.8 7.6  NEUTROABS 6.2 6.2  --   --   --   HGB 11.8* 11.0* 12.4 12.2 12.0  HCT 38.3 36.2 40.2 39.0 38.8  MCV 93.0 93.1 91.8 91.5 92.2  PLT 178 163 188 193 180   Cardiac Enzymes: No results for input(s): CKTOTAL, CKMB, CKMBINDEX, TROPONINI in the last 168 hours. BNP: BNP (last 3 results) Recent Labs    12/14/21 1703  BNP 441.7*    ProBNP (last 3 results) Recent Labs    08/17/21 1249  PROBNP 260.0*    CBG: No results for input(s): GLUCAP in the last 168 hours.     Signed:  Kayleen Memos, MD [facility] 12/18/2021, 1:23 PM

## 2021-12-18 NOTE — Plan of Care (Signed)
  Problem: Education: Goal: Knowledge of risk factors and measures for prevention of condition will improve Outcome: Progressing   

## 2021-12-18 NOTE — Progress Notes (Signed)
Pt discharge to home, instructions reviewed with pt and granddaughter acknowledged understanding, Covid education complete. SRP,RN

## 2021-12-18 NOTE — Plan of Care (Signed)
°  Problem: Education: Goal: Knowledge of risk factors and measures for prevention of condition will improve 12/18/2021 1400 by Zadie Rhine, RN Outcome: Adequate for Discharge 12/18/2021 1400 by Zadie Rhine, RN Outcome: Progressing   Problem: Coping: Goal: Psychosocial and spiritual needs will be supported Outcome: Adequate for Discharge   Problem: Respiratory: Goal: Will maintain a patent airway Outcome: Adequate for Discharge Goal: Complications related to the disease process, condition or treatment will be avoided or minimized Outcome: Adequate for Discharge   Problem: Acute Rehab OT Goals (only OT should resolve) Goal: Pt. Will Perform Grooming Outcome: Adequate for Discharge Goal: Pt. Will Perform Lower Body Bathing Outcome: Adequate for Discharge Goal: Pt. Will Perform Lower Body Dressing Outcome: Adequate for Discharge Goal: Pt. Will Transfer To Toilet Outcome: Adequate for Discharge Goal: Pt. Will Perform Toileting-Clothing Manipulation Outcome: Adequate for Discharge Goal: OT Additional ADL Goal #1 Outcome: Adequate for Discharge   Problem: Acute Rehab PT Goals(only PT should resolve) Goal: Pt Will Go Supine/Side To Sit Outcome: Adequate for Discharge Goal: Pt Will Go Sit To Supine/Side Outcome: Adequate for Discharge Goal: Patient Will Transfer Sit To/From Stand Outcome: Adequate for Discharge Goal: Pt Will Ambulate Outcome: Adequate for Discharge   Problem: Education: Goal: Knowledge of General Education information will improve Description: Including pain rating scale, medication(s)/side effects and non-pharmacologic comfort measures Outcome: Adequate for Discharge   Problem: Health Behavior/Discharge Planning: Goal: Ability to manage health-related needs will improve Outcome: Adequate for Discharge   Problem: Clinical Measurements: Goal: Ability to maintain clinical measurements within normal limits will improve Outcome: Adequate for  Discharge Goal: Will remain free from infection Outcome: Adequate for Discharge Goal: Diagnostic test results will improve Outcome: Adequate for Discharge Goal: Respiratory complications will improve Outcome: Adequate for Discharge Goal: Cardiovascular complication will be avoided Outcome: Adequate for Discharge   Problem: Activity: Goal: Risk for activity intolerance will decrease Outcome: Adequate for Discharge   Problem: Nutrition: Goal: Adequate nutrition will be maintained Outcome: Adequate for Discharge   Problem: Coping: Goal: Level of anxiety will decrease Outcome: Adequate for Discharge   Problem: Elimination: Goal: Will not experience complications related to bowel motility Outcome: Adequate for Discharge Goal: Will not experience complications related to urinary retention Outcome: Adequate for Discharge   Problem: Pain Managment: Goal: General experience of comfort will improve Outcome: Adequate for Discharge   Problem: Safety: Goal: Ability to remain free from injury will improve Outcome: Adequate for Discharge   Problem: Skin Integrity: Goal: Risk for impaired skin integrity will decrease Outcome: Adequate for Discharge

## 2021-12-19 LAB — CULTURE, BLOOD (ROUTINE X 2)
Culture: NO GROWTH
Culture: NO GROWTH
Special Requests: ADEQUATE

## 2021-12-20 ENCOUNTER — Telehealth: Payer: Self-pay

## 2021-12-20 NOTE — Telephone Encounter (Signed)
Transition Care Management Follow-up Telephone Call Date of discharge and from where: 12/18/21 from [place] How have you been since you were released from the hospital? Patient states doing ok since discharge Any questions or concerns? No  Items Reviewed: Did the pt receive and understand the discharge instructions provided? Yes  Medications obtained and verified? Yes  Other? No  Any new allergies since your discharge? No  Dietary orders reviewed? Yes Do you have support at home? Yes   Home Care and Equipment/Supplies: Were home health services ordered? yes If so, what is the name of the agency? [facility]  Has the agency set up a time to come to the patient's home? no Were any new equipment or medical supplies ordered?  No What is the name of the medical supply agency? N/A Were you able to get the supplies/equipment? not applicable Do you have any questions related to the use of the equipment or supplies? No  Functional Questionnaire: (I = Independent and D = Dependent) ADLs: I  Bathing/Dressing- I  Meal Prep- I with assistance  Eating- I  Maintaining continence- I  Transferring/Ambulation- I with assistance of walker  Managing Meds- I with assistance  Follow up appointments reviewed:  PCP Hospital f/u appt confirmed? Yes  Scheduled to see Dr. Danise Mina -PCP on 12/28/21 @ 3:30pm. [facility] f/u appt confirmed? Yes  Scheduled to see Dr Gwenlyn Found on 03/13/22 @ 10:30am. Are transportation arrangements needed? No  If their condition worsens, is the pt aware to call PCP or go to the Emergency Dept.? Yes Was the patient provided with contact information for the PCP's office or ED? Yes Was to pt encouraged to call back with questions or concerns? Yes

## 2021-12-26 ENCOUNTER — Telehealth: Payer: Self-pay | Admitting: Family Medicine

## 2021-12-26 NOTE — Telephone Encounter (Signed)
Britany from [facility] called to make Dr Danise Mina aware that pt refused her OT evaluation  # [phone]

## 2021-12-27 NOTE — Telephone Encounter (Signed)
Noted.  Upcoming appt tomorrow.

## 2021-12-28 ENCOUNTER — Other Ambulatory Visit: Payer: Self-pay

## 2021-12-28 ENCOUNTER — Ambulatory Visit (INDEPENDENT_AMBULATORY_CARE_PROVIDER_SITE_OTHER): Payer: Medicare HMO | Admitting: Family Medicine

## 2021-12-28 ENCOUNTER — Encounter: Payer: Self-pay | Admitting: Family Medicine

## 2021-12-28 VITALS — BP 126/62 | HR 76 | Temp 98.3°F | Ht 64.0 in | Wt 221.2 lb

## 2021-12-28 DIAGNOSIS — I5032 Chronic diastolic (congestive) heart failure: Secondary | ICD-10-CM

## 2021-12-28 DIAGNOSIS — I10 Essential (primary) hypertension: Secondary | ICD-10-CM | POA: Diagnosis not present

## 2021-12-28 DIAGNOSIS — R0609 Other forms of dyspnea: Secondary | ICD-10-CM | POA: Diagnosis not present

## 2021-12-28 DIAGNOSIS — N1832 Chronic kidney disease, stage 3b: Secondary | ICD-10-CM | POA: Diagnosis not present

## 2021-12-28 DIAGNOSIS — U071 COVID-19: Secondary | ICD-10-CM | POA: Diagnosis not present

## 2021-12-28 MED ORDER — ALBUTEROL SULFATE HFA 108 (90 BASE) MCG/ACT IN AERS: 1.0000 | INHALATION_SPRAY | Freq: Four times a day (QID) | RESPIRATORY_TRACT | 1 refills | Status: DC | PRN

## 2021-12-28 NOTE — Progress Notes (Signed)
Patient ID: [name], female    DOB: Oct 05, 1930, 87 y.o.   MRN: [id number]  This visit was conducted in person.  BP 126/62    Pulse 76    Temp 98.3 F (36.8 C) (Temporal)    Ht 5\' 4"  (1.626 m)    Wt 221 lb 4 oz (100.4 kg)    SpO2 97%    BMI 37.98 kg/m    CC: hospitalization f/u visit  Subjective:   HPI: [name] is a 87 y.o. female presenting on 12/28/2021 for Hospitalization Follow-up (Admitted on 12/14/21 at [facility], dx hypokalemia. Pt accompanied by granddaughter, [name]. ) Great granddaughter   Recent hospitalization for progressive dyspnea with cough - found to be in acute hypoxic respiratory failure due to COVID-19 infection. Treated with IV remdesivir and IV solumedrol.  Hospital records reviewed. Med rec performed.  Discharged off oxygen.   Stayed with grand daughter through Christmas   Since home, no significant cough but stays dyspneic - needing albuterol twice daily.  She is using flutter valve and incentive spirometer well.  Great grand daughter helps her with pill boxes 3 weeks at a time.   Home health [facility] PT/OT set up.  Other follow up appointments scheduled: 03/13/2022 Gwenlyn Found) ______________________________________________________________________ Hospital admission: 12/14/2021 Hospital discharge: 12/18/2021 TCM f/u phone call: performed on 12/20/2021  Recommendations for Outpatient Follow-up:  1. Follow-up with your primary care provider 2. Follow-up with your cardiologist 3. Take your medication as prescribed 4. Continue PT OT with assistance and fall precautions   Discharge Diagnoses:  Active Hospital Problems   Diagnosis Date Noted  COVID-19 virus infection 12/14/2021  SOB (shortness of breath) 12/15/2021  Hypokalemia 12/15/2021  Hypomagnesemia 12/15/2021  Allergic rhinitis 83/41/9622  Chronic diastolic CHF (congestive heart failure) ([place]) 03/12/2020  Hypertension 03/11/2012  Hyperlipidemia 03/11/2012  Hypothyroidism 03/11/2012     Relevant  past medical, surgical, family and social history reviewed and updated as indicated. Interim medical history since our last visit reviewed. Allergies and medications reviewed and updated. Outpatient Medications Prior to Visit  Medication Sig Dispense Refill   acetaminophen (TYLENOL) 500 MG tablet Take 1,000 mg by mouth daily.     ascorbic acid (VITAMIN C) 500 MG tablet Take 1 tablet by mouth daily. 90 tablet 0   calcium gluconate 500 MG tablet Take 1 tablet (500 mg total) by mouth daily. (Patient taking differently: Take 500 mg by mouth daily.)     cholecalciferol (VITAMIN D) 1000 UNITS tablet Take 1,000 Units by mouth daily.     denosumab (PROLIA) 60 MG/ML SOSY injection Inject 60 mg into the skin every 6 (six) months.     diphenhydramine-acetaminophen (TYLENOL PM) 25-500 MG TABS Take 2 tablets by mouth at bedtime as needed (sleep).      docusate sodium (COLACE) 100 MG capsule Take 100 mg by mouth 2 (two) times daily.     famotidine (PEPCID) 20 MG tablet Take 20 mg by mouth daily.     FLUoxetine (PROZAC) 40 MG capsule TAKE 1 CAPSULE EVERY DAY  (REPLACES  TABLETS) (Patient taking differently: Take 40 mg by mouth daily.) 90 capsule 0   furosemide (LASIX) 20 MG tablet Take 0.5 tablets (10 mg total) by mouth 2 (two) times daily. 90 tablet 1   hydrALAZINE (APRESOLINE) 25 MG tablet Take 1 tablet (25 mg total) by mouth in the morning and at bedtime. 60 tablet 6   hydrOXYzine (ATARAX/VISTARIL) 10 MG tablet Take 0.5-1 tablets (5-10 mg total) by mouth 2 (two) times  daily as needed for anxiety. 30 tablet 0   levothyroxine (SYNTHROID) 88 MCG tablet TAKE 1 TABLET EVERY DAY BEFORE BREAKFAST (Patient taking differently: Take 88 mcg by mouth daily before breakfast.) 90 tablet 0   lisinopril (ZESTRIL) 40 MG tablet TAKE 1 TABLET EVERY DAY (NEW DOSE) (Patient taking differently: Take 40 mg by mouth daily.) 90 tablet 0   loratadine (CLARITIN) 10 MG tablet TAKE 1 TABLET EVERY EVENING (Patient taking differently: Take  10 mg by mouth daily.) 90 tablet 1   metoprolol succinate (TOPROL-XL) 25 MG 24 hr tablet Take 1 tablet (25 mg total) by mouth daily. 90 tablet 2   mirabegron ER (MYRBETRIQ) 25 MG TB24 tablet TAKE 1 TABLET EVERY DAY (Patient taking differently: 25 mg daily.) 90 tablet 0   omeprazole (PRILOSEC) 40 MG capsule TAKE 1 CAPSULE EVERY DAY (Patient taking differently: 40 mg daily.) 90 capsule 0   polyethylene glycol (MIRALAX / GLYCOLAX) 17 g packet Take 17 g by mouth daily as needed for mild constipation. 14 each 0   pravastatin (PRAVACHOL) 40 MG tablet TAKE 1 TABLET EVERY DAY (Patient taking differently: Take 40 mg by mouth daily.) 90 tablet 1   Propylene Glycol (SYSTANE BALANCE OP) Place 1 drop into both eyes daily as needed (dry eyes).     verapamil (CALAN-SR) 240 MG CR tablet Take 1 tablet (240 mg total) by mouth daily.     vitamin B-12 (CYANOCOBALAMIN) 500 MCG tablet Take 1 tablet (500 mcg total) by mouth once a week. (Patient taking differently: Take 500 mcg by mouth once a week. Thursdays)     zinc sulfate 220 (50 Zn) MG capsule Take 1 capsule (220 mg total) by mouth daily. 90 capsule 0   albuterol (VENTOLIN HFA) 108 (90 Base) MCG/ACT inhaler Inhale 1 - 2 puffs into the lungs every 6 hours as needed for wheezing or shortness of breath. 8.5 g 0   benzonatate (TESSALON) 200 MG capsule Take 1 capsule by mouth 3 times daily. 20 capsule 0   No facility-administered medications prior to visit.     Per HPI unless specifically indicated in ROS section below Review of Systems  Objective:  BP 126/62    Pulse 76    Temp 98.3 F (36.8 C) (Temporal)    Ht 5\' 4"  (1.626 m)    Wt 221 lb 4 oz (100.4 kg)    SpO2 97%    BMI 37.98 kg/m   Wt Readings from Last 3 Encounters:  12/28/21 221 lb 4 oz (100.4 kg)  12/18/21 224 lb 3.3 oz (101.7 kg)  09/14/21 227 lb 1 oz (103 kg)      Physical Exam Vitals and nursing note reviewed.  Constitutional:      Appearance: Normal appearance. She is obese. She is not  ill-appearing.     Comments: Ambulates with walker  Cardiovascular:     Rate and Rhythm: Normal rate and regular rhythm.     Pulses: Normal pulses.     Heart sounds: Normal heart sounds. No murmur heard. Pulmonary:     Effort: Pulmonary effort is normal. No respiratory distress.     Breath sounds: No wheezing, rhonchi or rales.     Comments: Slightly coarse breath sounds Musculoskeletal:     Right lower leg: Edema (nonpitting) present.     Left lower leg: Edema (nonpitting) present.     Comments:  Mild discomfort to palpation at left popliteal region without palpable cords  Compression stockings in place Chronic R>L pedal edema  R calf circ 42cm L calf circ 40cm  Skin:    General: Skin is warm and dry.     Findings: No rash.  Neurological:     Mental Status: She is alert.  Psychiatric:        Mood and Affect: Mood normal.        Behavior: Behavior normal.       Assessment & Plan:  This visit occurred during the SARS-CoV-2 public health emergency.  Safety protocols were in place, including screening questions prior to the visit, additional usage of staff PPE, and extensive cleaning of exam room while observing appropriate contact time as indicated for disinfecting solutions.   Problem List Items Addressed This Visit     Hypertension    Chronic, stable on current regimen of lasix 10mg  bid, hydralazine 25mg  bid, lisinopril 40mg  daily, toprol XL 25mg  daily, and verapamil 240mg  daily. Continue.       Exertional dyspnea    Recovering after recent COVID infection, continue albuterol inhaler PRN. Update if not improving over time.       CKD (chronic kidney disease) stage 3, GFR 30-59 ml/min (HCC)    Latest GFR 30s while hospitalized. Will not recheck at this time.       Chronic diastolic CHF (congestive heart failure) ([place])    Presumed CHF exacerbation treated with extra IV lasix dose.  Currently seems euvolemic. Continue lasix 10mg  BID.       COVID-19 virus infection -  Primary    COVID infection 12/14/2021, s/p hospitalization with IV remdesivir and solumedrol treatment. Now back at home, [facility] PT/OT involved. Seems to be recovering well. Will refill albuterol inh to continue PRN use.        Meds ordered this encounter  Medications   albuterol (VENTOLIN HFA) 108 (90 Base) MCG/ACT inhaler    Sig: Inhale 1 - 2 puffs into the lungs every 6 hours as needed for wheezing or shortness of breath.    Dispense:  18 g    Refill:  1   No orders of the defined types were placed in this encounter.    Patient Instructions  Good to see you today! You are doing well Continue current medicines.  Albuterol refilled. Let us know if ongoing left leg pain for further evaluation.   Follow up plan: Return if symptoms worsen or fail to improve.  [name], MD

## 2021-12-28 NOTE — Assessment & Plan Note (Signed)
Chronic, stable on current regimen of lasix 10mg  bid, hydralazine 25mg  bid, lisinopril 40mg  daily, toprol XL 25mg  daily, and verapamil 240mg  daily. Continue.

## 2021-12-28 NOTE — Assessment & Plan Note (Addendum)
Presumed CHF exacerbation treated with extra IV lasix dose.  Currently seems euvolemic. Continue lasix 10mg  BID.

## 2021-12-28 NOTE — Patient Instructions (Addendum)
Good to see you today! You are doing well Continue current medicines.  Albuterol refilled. Let us know if ongoing left leg pain for further evaluation.

## 2021-12-28 NOTE — Assessment & Plan Note (Signed)
COVID infection 12/14/2021, s/p hospitalization with IV remdesivir and solumedrol treatment. Now back at home, [facility] PT/OT involved. Seems to be recovering well. Will refill albuterol inh to continue PRN use.

## 2021-12-28 NOTE — Assessment & Plan Note (Signed)
Latest GFR 30s while hospitalized. Will not recheck at this time.

## 2021-12-28 NOTE — Assessment & Plan Note (Addendum)
Recovering after recent COVID infection, continue albuterol inhaler PRN. Update if not improving over time.

## 2022-01-02 ENCOUNTER — Telehealth: Payer: Self-pay | Admitting: Family Medicine

## 2022-01-02 NOTE — Telephone Encounter (Signed)
Noted. Given first mildly elevated reading, rec continued monitoring for now and let us know if BP elevation persists.  Current BP meds are lasix 10mg  bid, hydralazine 25mg  bid, lisinopril 40mg  daily, toprol XL 25mg  daily, and verapamil 240mg  daily.

## 2022-01-02 NOTE — Telephone Encounter (Signed)
The PT nurse came out to the house since they started on 12/28 and this is the first time her Bp has ben elevated to 148/72. And they wanted to notify Dr. Darnell Level

## 2022-01-04 DIAGNOSIS — M810 Age-related osteoporosis without current pathological fracture: Secondary | ICD-10-CM | POA: Diagnosis not present

## 2022-01-04 DIAGNOSIS — K5909 Other constipation: Secondary | ICD-10-CM

## 2022-01-04 DIAGNOSIS — Z87891 Personal history of nicotine dependence: Secondary | ICD-10-CM

## 2022-01-04 DIAGNOSIS — U071 COVID-19: Secondary | ICD-10-CM | POA: Diagnosis not present

## 2022-01-04 DIAGNOSIS — J309 Allergic rhinitis, unspecified: Secondary | ICD-10-CM | POA: Diagnosis not present

## 2022-01-04 DIAGNOSIS — M1990 Unspecified osteoarthritis, unspecified site: Secondary | ICD-10-CM | POA: Diagnosis not present

## 2022-01-04 DIAGNOSIS — F32A Depression, unspecified: Secondary | ICD-10-CM

## 2022-01-04 DIAGNOSIS — E785 Hyperlipidemia, unspecified: Secondary | ICD-10-CM | POA: Diagnosis not present

## 2022-01-04 DIAGNOSIS — K219 Gastro-esophageal reflux disease without esophagitis: Secondary | ICD-10-CM

## 2022-01-04 DIAGNOSIS — N1832 Chronic kidney disease, stage 3b: Secondary | ICD-10-CM | POA: Diagnosis not present

## 2022-01-04 DIAGNOSIS — Z9181 History of falling: Secondary | ICD-10-CM

## 2022-01-04 DIAGNOSIS — E876 Hypokalemia: Secondary | ICD-10-CM

## 2022-01-04 DIAGNOSIS — E039 Hypothyroidism, unspecified: Secondary | ICD-10-CM | POA: Diagnosis not present

## 2022-01-04 DIAGNOSIS — I5022 Chronic systolic (congestive) heart failure: Secondary | ICD-10-CM | POA: Diagnosis not present

## 2022-01-04 DIAGNOSIS — I080 Rheumatic disorders of both mitral and aortic valves: Secondary | ICD-10-CM

## 2022-01-04 DIAGNOSIS — I130 Hypertensive heart and chronic kidney disease with heart failure and stage 1 through stage 4 chronic kidney disease, or unspecified chronic kidney disease: Secondary | ICD-10-CM | POA: Diagnosis not present

## 2022-01-04 NOTE — Telephone Encounter (Signed)
[name]  PT notified as instructed by telephone and verbalized understanding. [name] stated that she will continue to monitor patient's blood pressure and will report any elevated readings because they are advised to report any readings above 140.

## 2022-01-07 ENCOUNTER — Telehealth: Payer: Self-pay

## 2022-01-07 ENCOUNTER — Telehealth: Payer: Self-pay | Admitting: Family Medicine

## 2022-01-07 MED ORDER — ALBUTEROL SULFATE HFA 108 (90 BASE) MCG/ACT IN AERS: 1.0000 | INHALATION_SPRAY | Freq: Four times a day (QID) | RESPIRATORY_TRACT | 0 refills | Status: DC | PRN

## 2022-01-07 NOTE — Telephone Encounter (Signed)
Have sent in #54g however with 0 refills as this med should be PRN only so expect #54 to last >3 mo

## 2022-01-07 NOTE — Telephone Encounter (Signed)
Pt daughter called stating that she needs pt medical list before November and then after December 23. Pt daughter states that you can email it to her at [email or web address]. Please advise.

## 2022-01-07 NOTE — Telephone Encounter (Signed)
Received request from pharmacy for 90 day supply for albuterol inhaler. RX was sent in for #18 and asking for #54. Is this ok?

## 2022-01-08 NOTE — Telephone Encounter (Signed)
Patient's daughter was advised that we can not e-mail her the list but will be glad to mail it to her. Patient's daughter stated that her daughter has access to [name]'s mychart and they will go into her account and confirm her medications. [name] stated when her mom was in the hospital she was asked to confirm her mom's medications and she just wanted a list to review. Advised patient's daughter to call back if she needs any more help with this and she verbalized understanding.

## 2022-01-10 ENCOUNTER — Other Ambulatory Visit: Payer: Self-pay | Admitting: Family Medicine

## 2022-01-18 ENCOUNTER — Other Ambulatory Visit: Payer: Self-pay | Admitting: Family Medicine

## 2022-01-18 NOTE — Telephone Encounter (Signed)
E-scribed refill.  Plz schedule lab and MCR cpe visits.

## 2022-01-21 ENCOUNTER — Telehealth: Payer: Self-pay | Admitting: Family Medicine

## 2022-01-21 NOTE — Telephone Encounter (Addendum)
Noted.  Fyi to Dr. G.  

## 2022-01-21 NOTE — Telephone Encounter (Signed)
Pt stated she will call us when she is ready to schedule  cpe/lab and also sent my chart letter

## 2022-01-31 ENCOUNTER — Telehealth: Payer: Self-pay | Admitting: Family Medicine

## 2022-01-31 ENCOUNTER — Other Ambulatory Visit (HOSPITAL_COMMUNITY): Payer: Self-pay

## 2022-01-31 MED ORDER — VERAPAMIL HCL ER 240 MG PO TBCR: 240.0000 mg | EXTENDED_RELEASE_TABLET | Freq: Every day | ORAL | 0 refills | Status: DC

## 2022-01-31 NOTE — Addendum Note (Signed)
Addended by: Brenton Grills on: 08/05/1958 74:71 PM   Modules accepted: Orders

## 2022-01-31 NOTE — Telephone Encounter (Signed)
E-scribed refill.  Plz schedule lab and cpe visits.  

## 2022-01-31 NOTE — Telephone Encounter (Signed)
°  Encourage patient to contact the pharmacy for refills or they can request refills through [facility]:  Please schedule appointment if longer than 1 year  NEXT APPOINTMENT DATE:  MEDICATION:verapamil (CALAN-SR) 240 MG CR tablet,hydrALAZINE (APRESOLINE) 25 MG tablet  Is the patient out of medication?   [place], [place]  Let patient know to contact pharmacy at the end of the day to make sure medication is ready.  Please notify patient to allow 48-72 hours to process  CLINICAL FILLS OUT ALL BELOW:   LAST REFILL:  QTY:  REFILL DATE:    OTHER COMMENTS:    Okay for refill?  Please advise

## 2022-02-04 NOTE — Telephone Encounter (Signed)
Called pt she said thst she would have her daughter call us back to schedule

## 2022-02-07 ENCOUNTER — Other Ambulatory Visit: Payer: Self-pay | Admitting: Family Medicine

## 2022-02-28 ENCOUNTER — Telehealth: Payer: Self-pay

## 2022-02-28 NOTE — Telephone Encounter (Signed)
Nenita, calling with [facility] needing a verbal order for patient just to re-certification for physical therapy one day for 8 weeks. CB [phone]

## 2022-02-28 NOTE — Telephone Encounter (Signed)
Agree with this. Thanks.  

## 2022-02-28 NOTE — Telephone Encounter (Signed)
Lvm asking Nenita to call back.  Need to inform her Dr. Darnell Level is giving verbal orders for services requested for pt.  ?

## 2022-03-01 NOTE — Telephone Encounter (Signed)
Lvm asking [name] to call back.  Need to inform her Dr. Darnell Level is giving verbal orders for services requested for pt.  ?

## 2022-03-04 NOTE — Telephone Encounter (Signed)
Left another message on voicemail for Nenita to call the office back.  Will wait for a call back. ?

## 2022-03-04 NOTE — Telephone Encounter (Signed)
Gave verbal orders to [name]. ?

## 2022-03-06 DIAGNOSIS — E876 Hypokalemia: Secondary | ICD-10-CM | POA: Diagnosis not present

## 2022-03-06 DIAGNOSIS — N1832 Chronic kidney disease, stage 3b: Secondary | ICD-10-CM | POA: Diagnosis not present

## 2022-03-06 DIAGNOSIS — Z87891 Personal history of nicotine dependence: Secondary | ICD-10-CM

## 2022-03-06 DIAGNOSIS — Z8616 Personal history of COVID-19: Secondary | ICD-10-CM

## 2022-03-06 DIAGNOSIS — Z9181 History of falling: Secondary | ICD-10-CM

## 2022-03-06 DIAGNOSIS — I5022 Chronic systolic (congestive) heart failure: Secondary | ICD-10-CM | POA: Diagnosis not present

## 2022-03-06 DIAGNOSIS — I130 Hypertensive heart and chronic kidney disease with heart failure and stage 1 through stage 4 chronic kidney disease, or unspecified chronic kidney disease: Secondary | ICD-10-CM | POA: Diagnosis not present

## 2022-03-06 DIAGNOSIS — M810 Age-related osteoporosis without current pathological fracture: Secondary | ICD-10-CM | POA: Diagnosis not present

## 2022-03-06 DIAGNOSIS — F419 Anxiety disorder, unspecified: Secondary | ICD-10-CM

## 2022-03-06 DIAGNOSIS — F32A Depression, unspecified: Secondary | ICD-10-CM

## 2022-03-06 DIAGNOSIS — I080 Rheumatic disorders of both mitral and aortic valves: Secondary | ICD-10-CM

## 2022-03-06 DIAGNOSIS — J309 Allergic rhinitis, unspecified: Secondary | ICD-10-CM | POA: Diagnosis not present

## 2022-03-06 DIAGNOSIS — K219 Gastro-esophageal reflux disease without esophagitis: Secondary | ICD-10-CM

## 2022-03-06 DIAGNOSIS — K5909 Other constipation: Secondary | ICD-10-CM

## 2022-03-06 DIAGNOSIS — E039 Hypothyroidism, unspecified: Secondary | ICD-10-CM | POA: Diagnosis not present

## 2022-03-06 DIAGNOSIS — E785 Hyperlipidemia, unspecified: Secondary | ICD-10-CM | POA: Diagnosis not present

## 2022-03-06 DIAGNOSIS — M1990 Unspecified osteoarthritis, unspecified site: Secondary | ICD-10-CM | POA: Diagnosis not present

## 2022-03-13 ENCOUNTER — Ambulatory Visit: Payer: Medicare HMO | Admitting: Cardiovascular Disease

## 2022-03-13 ENCOUNTER — Encounter: Payer: Self-pay | Admitting: Cardiovascular Disease

## 2022-03-13 ENCOUNTER — Other Ambulatory Visit: Payer: Self-pay

## 2022-03-13 VITALS — BP 134/68 | HR 74 | Ht 64.0 in | Wt 217.4 lb

## 2022-03-13 DIAGNOSIS — R600 Localized edema: Secondary | ICD-10-CM | POA: Diagnosis not present

## 2022-03-13 DIAGNOSIS — E782 Mixed hyperlipidemia: Secondary | ICD-10-CM | POA: Diagnosis not present

## 2022-03-13 DIAGNOSIS — I10 Essential (primary) hypertension: Secondary | ICD-10-CM | POA: Diagnosis not present

## 2022-03-13 NOTE — Patient Instructions (Signed)
Medication Instructions:  ?Your physician recommends that you continue on your current medications as directed. Please refer to the Current Medication list given to you today. ? ?*If you need a refill on your cardiac medications before your next appointment, please call your pharmacy* ? ? ?Follow-Up: ?At [facility], you and your health needs are our priority.  As part of our continuing mission to provide you with exceptional heart care, we have created designated Provider Care Teams.  These Care Teams include your primary Cardiologist (physician) and Advanced Practice Providers (APPs -  Physician Assistants and Nurse Practitioners) who all work together to provide you with the care you need, when you need it. ? ?We recommend signing up for the patient portal called "MyChart".  Sign up information is provided on this After Visit Summary.  MyChart is used to connect with patients for Virtual Visits (Telemedicine).  Patients are able to view lab/test results, encounter notes, upcoming appointments, etc.  Non-urgent messages can be sent to your provider as well.   ?To learn more about what you can do with MyChart, go to [email or web address].   ? ?Your next appointment:   ?6 month(s) ? ?The format for your next appointment:   ?In Person ? ?Provider:   ?Jesse Cleaver, FNP, Angela Duke, PA-C, Callie Goodrich, PA-C, Jennifer, Lambert, PA-C, Kathryn Lawrence, DNP, ANP, Hao Meng, PA-C, or Emily Monge, NP     ? ? ?Then, Jonathan Berry, MD will plan to see you again in 12 month(s). ?

## 2022-03-13 NOTE — Progress Notes (Signed)
? ? ? ?03/13/2022 ?[name]   ?24-Jul-1930  ?[id number] ? ?Primary Physician Brittany Bush, MD ?Primary Cardiologist: Brittany Harp MD [name], [place] ? ?HPI:  [name] is a 88 y.o.  moderately overweight widowed Caucasian female mother of 2 living children (2 deceased), grandmother of 5 grandchildren who was referred by Dr. Danise Wu for evaluation of dyspnea.  She is very hard of hearing.    I last saw her in the office 12/17/2019.  She is accompanied by her granddaughter [name] today.  She lives in an assisted care facility but does her own cooking and driving.  Her risk factors include treated hypertension hyperlipidemia.  She is not diabetic.  She is never smoked.  There is no family history for heart disease.  She is never had a heart attack or stroke.  She does not denies chest pain.  She is complained of some dyspnea since being quarantined during COVID-19.  Recent 2D echo performed 09/10/2019 revealed normal LV systolic function with severe asymmetric left ventricular hypertrophy of the basal septal wall without S.A.M.  Is possible that her dyspnea is related to this.  I am going to start her on low-dose verapamil and will reassess ?  ?Since I saw her over 2 years ago she was admitted to the hospital in December with COVID-pneumonia and diastolic heart failure and was diuresed.  There is a question of basal septal hypertrophy on verapamil.  She does have a lower extremity edema on oral diuretics.  She does live in a assisted care facility with independent living and is fairly self-sufficient.  Since being in the hospital she still is short of breath and is on inhaled bronchodilator. ? ? ?Current Meds  ?Medication Sig  ? acetaminophen (TYLENOL) 500 MG tablet Take 1,000 mg by mouth daily.  ? albuterol (VENTOLIN HFA) 108 (90 Base) MCG/ACT inhaler Inhale 1 - 2 puffs into the lungs every 6 hours as needed for wheezing or shortness of breath.  ? ascorbic acid (VITAMIN C) 500 MG tablet Take 1  tablet by mouth daily.  ? calcium gluconate 500 MG tablet Take 1 tablet (500 mg total) by mouth daily. (Patient taking differently: Take 500 mg by mouth daily.)  ? cholecalciferol (VITAMIN D) 1000 UNITS tablet Take 1,000 Units by mouth daily.  ? denosumab (PROLIA) 60 MG/ML SOSY injection Inject 60 mg into the skin every 6 (six) months.  ? diphenhydramine-acetaminophen (TYLENOL PM) 25-500 MG TABS Take 2 tablets by mouth at bedtime as needed (sleep).   ? docusate sodium (COLACE) 100 MG capsule Take 100 mg by mouth 2 (two) times daily.  ? famotidine (PEPCID) 20 MG tablet Take 20 mg by mouth daily.  ? FLUoxetine (PROZAC) 40 MG capsule TAKE 1 CAPSULE EVERY DAY  (REPLACES  TABLETS)  ? furosemide (LASIX) 20 MG tablet TAKE 1/2 TABLET (10 MG TOTAL) BY MOUTH 2 (TWO) TIMES DAILY.  ? hydrALAZINE (APRESOLINE) 25 MG tablet Take 1 tablet (25 mg total) by mouth in the morning and at bedtime.  ? hydrOXYzine (ATARAX/VISTARIL) 10 MG tablet Take 0.5-1 tablets (5-10 mg total) by mouth 2 (two) times daily as needed for anxiety.  ? levothyroxine (SYNTHROID) 88 MCG tablet TAKE 1 TABLET EVERY DAY BEFORE BREAKFAST  ? lisinopril (ZESTRIL) 40 MG tablet TAKE 1 TABLET EVERY DAY (NEW DOSE) (Patient taking differently: Take 40 mg by mouth daily.)  ? loratadine (CLARITIN) 10 MG tablet TAKE 1 TABLET EVERY EVENING  ? metoprolol succinate (TOPROL-XL) 25 MG 24 hr  tablet Take 1 tablet (25 mg total) by mouth daily.  ? MYRBETRIQ 25 MG TB24 tablet TAKE 1 TABLET EVERY DAY  ? omeprazole (PRILOSEC) 40 MG capsule TAKE 1 CAPSULE EVERY DAY  ? polyethylene glycol (MIRALAX / GLYCOLAX) 17 g packet Take 17 g by mouth daily as needed for mild constipation.  ? pravastatin (PRAVACHOL) 40 MG tablet TAKE 1 TABLET EVERY DAY (Patient taking differently: Take 40 mg by mouth daily.)  ? Propylene Glycol (SYSTANE BALANCE OP) Place 1 drop into both eyes daily as needed (dry eyes).  ? verapamil (CALAN-SR) 240 MG CR tablet Take 1 tablet (240 mg total) by mouth daily.  ? vitamin  B-12 (CYANOCOBALAMIN) 500 MCG tablet Take 1 tablet (500 mcg total) by mouth once a week. (Patient taking differently: Take 500 mcg by mouth once a week. Thursdays)  ? zinc sulfate 220 (50 Zn) MG capsule Take 1 capsule (220 mg total) by mouth daily.  ?  ? ?No Known Allergies ? ?Social History  ? ?Socioeconomic History  ? Marital status: Widowed  ?  Spouse name: Not on file  ? Number of children: Not on file  ? Years of education: Not on file  ? Highest education level: Not on file  ?Occupational History  ? Not on file  ?Tobacco Use  ? Smoking status: Former  ?  Types: Cigarettes  ?  Quit date: 12/30/1992  ?  Years since quitting: 29.2  ? Smokeless tobacco: Never  ?Vaping Use  ? Vaping Use: Never used  ?Substance and Sexual Activity  ? Alcohol use: No  ? Drug use: No  ? Sexual activity: Never  ?Other Topics Concern  ? Not on file  ?Social History Narrative  ? Widower since 1999/03/18 - husband battled cancer for 6 yrs, died from MI  ? Daughter [name]) passed away 03-18-2015  ? Lies in retirement home - [name] on [address].  ? Occupation: retired  ? HS: 12th grade  ? Activity: no regular exercise  ? Diet: good water, fruits/vegetables daily  ? ?Social Determinants of Health  ? ?Financial Resource Strain: Not on file  ?Food Insecurity: Not on file  ?Transportation Needs: Not on file  ?Physical Activity: Not on file  ?Stress: Not on file  ?Social Connections: Not on file  ?Intimate Partner Violence: Not on file  ?  ? ?Review of Systems: ?General: negative for chills, fever, night sweats or weight changes.  ?Cardiovascular: negative for chest pain, dyspnea on exertion, edema, orthopnea, palpitations, paroxysmal nocturnal dyspnea or shortness of breath ?Dermatological: negative for rash ?Respiratory: negative for cough or wheezing ?Urologic: negative for hematuria ?Abdominal: negative for nausea, vomiting, diarrhea, bright red blood per rectum, melena, or hematemesis ?Neurologic: negative for visual changes, syncope, or  dizziness ?All other systems reviewed and are otherwise negative except as noted above. ? ? ? ?Blood pressure 134/68, pulse 74, height '5\' 4"'$  (1.626 m), weight 217 lb 6.4 oz (98.6 kg), SpO2 99 %.  ?General appearance: alert and no distress ?Neck: no adenopathy, no carotid bruit, no JVD, supple, symmetrical, trachea midline, and thyroid not enlarged, symmetric, no tenderness/mass/nodules ?Lungs: clear to auscultation bilaterally ?Heart: regular rate and rhythm, S1, S2 normal, no murmur, click, rub or gallop ?Extremities: 2+ pitting edema bilaterally ?Pulses: 2+ and symmetric ?Skin: Skin color, texture, turgor normal. No rashes or lesions ?Neurologic: Grossly normal ? ?EKG sinus rhythm at 74 with left anterior fascicular block and septal Q waves.  I personally reviewed this EKG. ? ?ASSESSMENT AND PLAN:  ? ?  Hyperlipidemia ?History of hyperlipidemia on statin therapy with lipid profile performed 10/04/2020 revealed a total cholesterol of 56, LDL of 71 and HDL 61. ? ?Hypertension ?History of essential hypertension blood pressure measured today 134/68.  She is on hydralazine, lisinopril, metoprolol and verapamil. ? ?Bilateral lower extremity edema ?On oral diuretics. ? ? ? ? ?Brittany Harp MD FACP,FACC,FAHA, FSCAI ?03/13/2022 ?11:21 AM ?

## 2022-03-13 NOTE — Assessment & Plan Note (Signed)
History of hyperlipidemia on statin therapy with lipid profile performed 10/04/2020 revealed a total cholesterol of 56, LDL of 71 and HDL 61. ?

## 2022-03-13 NOTE — Assessment & Plan Note (Signed)
On oral diuretics. ?

## 2022-03-13 NOTE — Assessment & Plan Note (Signed)
History of essential hypertension blood pressure measured today 134/68.  She is on hydralazine, lisinopril, metoprolol and verapamil. ?

## 2022-03-24 ENCOUNTER — Other Ambulatory Visit: Payer: Self-pay | Admitting: Family Medicine

## 2022-03-25 ENCOUNTER — Other Ambulatory Visit: Payer: Self-pay | Admitting: Family Medicine

## 2022-03-25 MED ORDER — ALBUTEROL SULFATE HFA 108 (90 BASE) MCG/ACT IN AERS: 1.0000 | INHALATION_SPRAY | Freq: Four times a day (QID) | RESPIRATORY_TRACT | 0 refills | Status: DC | PRN

## 2022-03-25 NOTE — Telephone Encounter (Signed)
?  Encourage patient to contact the pharmacy for refills or they can request refills through [facility] ? ?LAST APPOINTMENT DATE:  Please schedule appointment if longer than 1 year ? ?NEXT APPOINTMENT DATE: ? ?MEDICATION:albuterol (VENTOLIN HFA) 108 (90 Base) MCG/ACT inhaler ? ?Is the patient out of medication?  ? ?PHARMACY:[id number] - [place], [address] AT [address] ? ?Let patient know to contact pharmacy at the end of the day to make sure medication is ready. ? ?Please notify patient to allow 48-72 hours to process ? ?CLINICAL FILLS OUT ALL BELOW:  ? ?LAST REFILL: ? ?QTY: ? ?REFILL DATE: ? ? ? ?OTHER COMMENTS:  ? ? ?Okay for refill? ? ?Please advise ? ? ?  ?

## 2022-03-25 NOTE — Telephone Encounter (Signed)
Last refill 01/07/22   54 G ?Last office visit 12/28/21 ?Upcoming appointment 05/14/22 ?

## 2022-03-26 NOTE — Telephone Encounter (Signed)
Will forward to Dr Darnell Level as her last BUN Creatinine were elevated. One of the requests is for lisinopril ?

## 2022-03-27 NOTE — Telephone Encounter (Signed)
ERx 

## 2022-03-28 ENCOUNTER — Telehealth: Payer: Self-pay

## 2022-03-28 ENCOUNTER — Telehealth: Payer: Self-pay | Admitting: Family Medicine

## 2022-03-28 NOTE — Telephone Encounter (Signed)
Pt's dtr, [name], called in and said that a staff person had already called [facility] to notify us of pt's raised BP and was told Dr. Darnell Level wants to see pt asap. I don't see any phone note in reference to that.  ?Pt went to exercise at her living facility today and afterwards her BP was 150/80.  ?Pt's dtr scheduled appt for 04/12/22 at 12:30 as the earliest available appt unless notified of an earlier slot.  ?

## 2022-03-28 NOTE — Telephone Encounter (Signed)
See note below access nurse note. Pt already has appt with Dr Darnell Level on 04/12/22 at 12:30. [name] said pt is already on cancellation list for Dr Darnell Level. I spoke with [name] and pt has grandbaby so she has not gone to pts home to ck BP. Pt was supposed to ck BP after PT left. [name] called pt to ask what BP was. [name] said pt was not answering and she would continue to try to get pt or have someone else go takes pts BP and [name] will call value back to [facility].  [name] said pt did not have any symptoms no H/A, dizziness, CP SOB or  visions changes. Pt has not missed any BP meds. Pt s daughter said could be pt using inhaler wrong. UC & ED precautions given and pts daughter voiced understanding. Sending note to Dr Darnell Level who is out of office today, Romilda Garret NP who is in office today and LIsa CMA.  ?

## 2022-03-28 NOTE — Telephone Encounter (Signed)
Pt daughter called with pt BP readings. Pt daughter states that at 4:05 BP was 149/71. Please advise. ?

## 2022-03-28 NOTE — Telephone Encounter (Signed)
[facility] - Client ?TELEPHONE ADVICE RECORD ?AccessNurse? ?Patient ?Name: ?Brittany RUSS ?Wu ?Gender: Female ?DOB: 1930-01-11 ?Age: 88 Y 53 M 23 D ?Return ?Phone ?Number: ?[id number] ?(Primary) ?Address: ?City/ ?State/ ?Zip: ?[place] ? [address] ?Client [facility] - Client ?Client Site [facility] Day ?Provider [name] - MD ?Contact Type Call ?Who Is Calling Patient / Member / Family / Caregiver ?Call Type Triage / Clinical ?Caller Name Anderson Malta (physical therapist) ?Relationship To Patient Other ?Return Phone Number [phone] (Primary) ?Chief Complaint BLOOD PRESSURE LOW - Systolic (top ?number) 90 or less ?Reason for Call Symptomatic / Request for Health Information ?Initial Comment Caller states caregiver of a patient is dizzy and ?has a high blood pressure reading was 190/70 this ?morning and now its 150/70. ?Translation No ?Nurse Assessment ?Nurse: Hassell Done, RN, Melanie Date/Time Eilene Ghazi Time): 03/28/2022 2:09:04 PM ?Confirm and document reason for call. If ?symptomatic, describe symptoms. ?---Caller is physical therapist and pt is dizzy and has ?BP of 150/70 Takes lisinopril and hydralazine and ?furosemide , metoprolol are all meds she takes. Caller ?denies missing meds ?Does the patient have any new or worsening ?symptoms? ---Yes ?Will a triage be completed? ---Yes ?Related visit to physician within the last 2 weeks? ---No ?Does the PT have any chronic conditions? (i.e. ?diabetes, asthma, this includes High risk factors for ?pregnancy, etc.) ?---Yes ?List chronic conditions. ---high blood pressure ?Is this a behavioral health or substance abuse call? ---No ?Guidelines ?Guideline Title Affirmed Question Affirmed Notes Nurse Date/Time (Eastern ?Time) ?Blood Pressure - ?High ?[8] Systolic BP >= ?675 OR Diastolic >= ?80 AND [4] taking ?BP medications ?Hassell Done, RN, Melanie 03/28/2022 2:11:37 ?PM ?PLEASE NOTE: All timestamps  contained within this report are represented as [place] Standard Time. ?CONFIDENTIALTY NOTICE: This fax transmission is intended only for the addressee. It contains information that is legally privileged, confidential or ?otherwise protected from use or disclosure. If you are not the intended recipient, you are strictly prohibited from reviewing, disclosing, copying using ?or disseminating any of this information or taking any action in reliance on or regarding this information. If you have received this fax in error, please ?notify us immediately by telephone so that we can arrange for its return to [place]. Phone: [phone], Toll-Free: [phone], Fax: [phone] ?Page: 2 of 2 ?Call Id: [id number] ?Disp. Time (Eastern ?Time) Disposition Final User ?03/28/2022 2:06:52 PM Send to Urgent Queue Darkwa, Adjoa ?03/28/2022 2:15:54 PM SEE PCP WITHIN 3 DAYS Yes Hassell Done, RN, Threasa Beards ?Disposition Overriden: See PCP within 2 Weeks ?Override Reason: Patient?s symptoms need a higher level of care ?Caller Disagree/Comply Comply ?Caller Understands Yes ?PreDisposition Call Doctor ?Care Advice Given Per Guideline ?SEE PCP WITHIN 2 WEEKS: * You need to be seen for this ongoing problem within the next 2 weeks. CALL BACK IF: * Chest ?pain or difficulty breathing occurs * Difficulty walking, difficulty talking, or severe headache occurs * Your blood pressure is over ?160/100 * You become worse CARE ADVICE given per High Blood Pressure (Adult) guideline. ?Referrals ?REFERRED TO PCP OFFIC ?

## 2022-03-28 NOTE — Telephone Encounter (Signed)
We need someone to get in touch with patient to check in on her and make sure she is ok before waiting the 2 weeks for the appointment please ?

## 2022-03-29 NOTE — Telephone Encounter (Signed)
Agree with this - plz contact pt for update on how she's feeling.  ?I'd like her to keep daily BP log to bring in to her appt in April.  ?

## 2022-04-01 ENCOUNTER — Telehealth: Payer: Self-pay

## 2022-04-01 NOTE — Telephone Encounter (Signed)
Benefits submitted-pending ?Next injection due 04/03/22 or after ?

## 2022-04-02 MED ORDER — ALBUTEROL SULFATE HFA 108 (90 BASE) MCG/ACT IN AERS: 1.0000 | INHALATION_SPRAY | Freq: Four times a day (QID) | RESPIRATORY_TRACT | 0 refills | Status: DC | PRN

## 2022-04-02 NOTE — Telephone Encounter (Signed)
Patient's daughter notified as instructed by telephone and verbalized understanding. 

## 2022-04-02 NOTE — Telephone Encounter (Signed)
Left a message on voicemail for patient's daughter  to call the office back. 

## 2022-04-02 NOTE — Telephone Encounter (Signed)
Albuterol inh sent to [facility]. ?This is a rescue inhaler and should only be used PRN for shortness of breath or wheezing or cough. Don't use regularly as it can cause racing heart, jittery feeling, and the more she uses it the less effective it can become.  ?

## 2022-04-02 NOTE — Telephone Encounter (Signed)
Patient's daughter called back and was advised that her mom seems to be doing okay. [name] stated that they took her out to eat Friday night and she seemed to enjoy herself. [name] stated that her mom felt a little out of breathe but she walked further that night than she has in a while. [name] stated that her mom is out of the inhaler that she was given when she had covid. [name] stated that her mom was using the inhaler around the clock and probably used it more than needed. [name] stated that her mom wants a refill on her inhaler because it seems to help her breathing. [name] stated that they will keep a log of her mom's blood pressure and will bring it in when she comes for her appointment. [name] stated that she is not sure her machine is working like it is suppose to. Valla Leaver to bring the machine in when she comes so that we can compare her blood pressure to it being taken manually. [name] was advised to call the office if her mom has any more problems before here appointment and she verbalized understanding. ?Pharmacy [facility] ?

## 2022-04-02 NOTE — Addendum Note (Signed)
Addended by: [name] on: 04/02/2022 01:58 PM ? ? Modules accepted: Orders ? ?

## 2022-04-02 NOTE — Telephone Encounter (Signed)
Just getting this now - I think this has already been addressed.  ?

## 2022-04-03 ENCOUNTER — Other Ambulatory Visit: Payer: Self-pay | Admitting: Family Medicine

## 2022-04-12 ENCOUNTER — Ambulatory Visit (INDEPENDENT_AMBULATORY_CARE_PROVIDER_SITE_OTHER): Payer: Medicare HMO | Admitting: Family Medicine

## 2022-04-12 ENCOUNTER — Encounter: Payer: Self-pay | Admitting: Family Medicine

## 2022-04-12 VITALS — BP 132/70 | HR 68 | Temp 98.1°F | Ht 64.0 in | Wt 218.0 lb

## 2022-04-12 DIAGNOSIS — I5032 Chronic diastolic (congestive) heart failure: Secondary | ICD-10-CM | POA: Diagnosis not present

## 2022-04-12 DIAGNOSIS — F331 Major depressive disorder, recurrent, moderate: Secondary | ICD-10-CM

## 2022-04-12 DIAGNOSIS — I10 Essential (primary) hypertension: Secondary | ICD-10-CM | POA: Diagnosis not present

## 2022-04-12 MED ORDER — HYDROXYZINE HCL 10 MG PO TABS: 5.0000 mg | ORAL_TABLET | Freq: Two times a day (BID) | ORAL | 1 refills | Status: DC | PRN

## 2022-04-12 MED ORDER — DIPHENHYDRAMINE-APAP (SLEEP) 25-500 MG PO TABS: 1.0000 | ORAL_TABLET | Freq: Every evening | ORAL | Status: DC | PRN

## 2022-04-12 NOTE — Progress Notes (Signed)
? ? Patient ID: [name], female    DOB: 02-04-1930, 88 y.o.   MRN: [id number] ? ?This visit was conducted in person. ? ?BP 132/70   Pulse 68   Temp 98.1 ?F (36.7 ?C) (Temporal)   Ht '5\' 4"'$  (1.626 m)   Wt 218 lb (98.9 kg)   SpO2 96%   BMI 37.42 kg/m?   ? ?CC: HTN f/u visit  ?Subjective:  ? ?HPI: ?[name] is a 88 y.o. female presenting on 04/12/2022 for Hypertension (Here for f/u. Provided log of recent BP readings.  Pt brought in home BP monitor to compare.  Reading in office today, 146 66.  Pt accompanied by daughter, [name]. ) ? ? ?Recent high BP readings noted by PT - to 190/70.  ? ?HTN - Compliant with current antihypertensive regimen of lasix '20mg'$  1/2 tab BID, hydralazine '25mg'$  BID, lisinopril '40mg'$  daily, toprol XL '25mg'$  daily and verapamil CR '240mg'$  daily. Does check blood pressures at home and brings log: 127-170/70-80s, HR 60s ?No low blood pressure readings or symptoms of dizziness/syncope.  Denies HA, vision changes, CP/tightness, SOB, leg swelling.   ? ?Saw cardiology 02/2022.  ? ?Notes ongoing difficulty with depressed mood - this is despite prozac '40mg'$  daily. She has not been taking hydroxyzine for the past month. Notes both anxiety and depression.  ? ?This coming week is her last HHPT.  ?   ? ?Relevant past medical, surgical, family and social history reviewed and updated as indicated. Interim medical history since our last visit reviewed. ?Allergies and medications reviewed and updated. ?Outpatient Medications Prior to Visit  ?Medication Sig Dispense Refill  ? acetaminophen (TYLENOL) 500 MG tablet Take 1,000 mg by mouth daily.    ? albuterol (VENTOLIN HFA) 108 (90 Base) MCG/ACT inhaler Inhale 1 - 2 puffs into the lungs every 6 hours as needed for wheezing or shortness of breath. 18 g 0  ? calcium gluconate 500 MG tablet Take 1 tablet (500 mg total) by mouth daily. (Patient taking differently: Take 500 mg by mouth daily.)    ? cholecalciferol (VITAMIN D) 1000 UNITS tablet Take 1,000 Units by  mouth daily.    ? denosumab (PROLIA) 60 MG/ML SOSY injection Inject 60 mg into the skin every 6 (six) months.    ? docusate sodium (COLACE) 100 MG capsule Take 100 mg by mouth 2 (two) times daily.    ? famotidine (PEPCID) 20 MG tablet Take 20 mg by mouth daily.    ? FLUoxetine (PROZAC) 40 MG capsule TAKE 1 CAPSULE EVERY DAY  (REPLACES  TABLETS) 90 capsule 2  ? furosemide (LASIX) 20 MG tablet TAKE 1/2 TABLET (10 MG TOTAL) BY MOUTH 2 (TWO) TIMES DAILY. 90 tablet 0  ? hydrALAZINE (APRESOLINE) 25 MG tablet Take 1 tablet (25 mg total) by mouth in the morning and at bedtime. 60 tablet 6  ? levothyroxine (SYNTHROID) 88 MCG tablet TAKE 1 TABLET EVERY DAY BEFORE BREAKFAST 90 tablet 2  ? lisinopril (ZESTRIL) 40 MG tablet TAKE 1 TABLET EVERY DAY 90 tablet 0  ? loratadine (CLARITIN) 10 MG tablet TAKE 1 TABLET EVERY EVENING 90 tablet 0  ? metoprolol succinate (TOPROL-XL) 25 MG 24 hr tablet Take 1 tablet (25 mg total) by mouth daily. 90 tablet 2  ? MYRBETRIQ 25 MG TB24 tablet TAKE 1 TABLET EVERY DAY 90 tablet 2  ? omeprazole (PRILOSEC) 40 MG capsule TAKE 1 CAPSULE EVERY DAY 90 capsule 2  ? polyethylene glycol (MIRALAX / GLYCOLAX) 17 g packet Take  17 g by mouth daily as needed for mild constipation. 14 each 0  ? pravastatin (PRAVACHOL) 40 MG tablet TAKE 1 TABLET EVERY DAY 90 tablet 0  ? Propylene Glycol (SYSTANE BALANCE OP) Place 1 drop into both eyes daily as needed (dry eyes).    ? verapamil (CALAN-SR) 240 MG CR tablet Take 1 tablet (240 mg total) by mouth daily. 90 tablet 0  ? vitamin B-12 (CYANOCOBALAMIN) 500 MCG tablet Take 1 tablet (500 mcg total) by mouth once a week. (Patient taking differently: Take 500 mcg by mouth once a week. Thursdays)    ? diphenhydramine-acetaminophen (TYLENOL PM) 25-500 MG TABS Take 2 tablets by mouth at bedtime as needed (sleep).     ? hydrOXYzine (ATARAX/VISTARIL) 10 MG tablet Take 0.5-1 tablets (5-10 mg total) by mouth 2 (two) times daily as needed for anxiety. 30 tablet 0  ? ?No  facility-administered medications prior to visit.  ?  ? ?Per HPI unless specifically indicated in ROS section below ?Review of Systems ? ?Objective:  ?BP 132/70   Pulse 68   Temp 98.1 ?F (36.7 ?C) (Temporal)   Ht '5\' 4"'$  (1.626 m)   Wt 218 lb (98.9 kg)   SpO2 96%   BMI 37.42 kg/m?   ?Wt Readings from Last 3 Encounters:  ?04/12/22 218 lb (98.9 kg)  ?03/13/22 217 lb 6.4 oz (98.6 kg)  ?12/28/21 221 lb 4 oz (100.4 kg)  ?  ?  ?Physical Exam ?Vitals and nursing note reviewed.  ?Constitutional:   ?   Appearance: Normal appearance. She is obese. She is not ill-appearing.  ?   Comments: Ambulates with rollator  ?Cardiovascular:  ?   Rate and Rhythm: Normal rate and regular rhythm.  ?   Pulses: Normal pulses.  ?   Heart sounds: Normal heart sounds. No murmur heard. ?Pulmonary:  ?   Effort: Pulmonary effort is normal. No respiratory distress.  ?   Breath sounds: No wheezing, rhonchi or rales.  ?Musculoskeletal:  ?   Right lower leg: No edema.  ?   Left lower leg: No edema.  ?   Comments: Compression stockings in place  ?Neurological:  ?   Mental Status: She is alert.  ?Psychiatric:     ?   Mood and Affect: Mood normal.     ?   Behavior: Behavior normal.  ? ?   ?Lab Results  ?Component Value Date  ? CREATININE 1.30 (H) 12/18/2021  ? BUN 36 (H) 12/18/2021  ? NA 137 12/18/2021  ? K 4.2 12/18/2021  ? CL 101 12/18/2021  ? CO2 28 12/18/2021  ?  ?Lab Results  ?Component Value Date  ? WBC 7.6 12/18/2021  ? HGB 12.0 12/18/2021  ? HCT 38.8 12/18/2021  ? MCV 92.2 12/18/2021  ? PLT 180 12/18/2021  ?  ? ?Assessment & Plan:  ? ?Problem List Items Addressed This Visit   ? ? Hypertension - Primary  ?  Chronic, stable today. Home readings largely averaging well controlled as well. Reviewed optimal strategies to ensure accurate readings at home. Continue current regimen.  ?  ?  ? MDD (major depressive disorder), recurrent episode, moderate ([place])  ?  Chronic, not controlled well despite prozac '40mg'$  daily. Significant family stressors and  difficulty with loneliness. Support provided.  ?Will refill hydroxyzine '10mg'$  PRN which she finds beneficial.  ?Discussed options - increasing prozac dose vs adjuvant med vs counseling. She requests counseling referral which was placed today. ?I did recommend she drop tylenol PM dosing to  just one at night given anticholinergic concerns.  ?  ?  ? Relevant Medications  ? hydrOXYzine (ATARAX) 10 MG tablet  ? Other Relevant Orders  ? Ambulatory referral to Psychology  ? Chronic diastolic CHF (congestive heart failure) ([place])  ?  Appreciate cards eval.  ?Weight stable. Seems euvolemic.  ?  ?  ?  ? ?Meds ordered this encounter  ?Medications  ? hydrOXYzine (ATARAX) 10 MG tablet  ?  Sig: Take 0.5-1 tablets (5-10 mg total) by mouth 2 (two) times daily as needed for anxiety.  ?  Dispense:  30 tablet  ?  Refill:  1  ? diphenhydramine-acetaminophen (TYLENOL PM) 25-500 MG TABS tablet  ?  Sig: Take 1 tablet by mouth at bedtime as needed (sleep).  ? ?Orders Placed This Encounter  ?Procedures  ? Ambulatory referral to Psychology  ?  Referral Priority:   Routine  ?  Referral Type:   Psychiatric  ?  Referral Reason:   Specialty Services Required  ?  Requested Specialty:   Psychology  ?  Number of Visits Requested:   1  ? ? ? ?Patient Instructions  ?Hydroxyzine refilled to use as needed for anxiety. ?Drop tylenol PM to max 1 at night time.  ?Make sure you have been resting for 15-20 mins every time you check your blood pressure.  ?Keep follow up appointment.  ?We will refer you to counselor in [place] [phone] ? ?Follow up plan: ?No follow-ups on file. ? ?[name], MD   ?

## 2022-04-12 NOTE — Patient Instructions (Addendum)
Hydroxyzine refilled to use as needed for anxiety. ?Drop tylenol PM to max 1 at night time.  ?Make sure you have been resting for 15-20 mins every time you check your blood pressure.  ?Keep follow up appointment.  ?We will refer you to counselor in [place] [phone] ?

## 2022-04-12 NOTE — Assessment & Plan Note (Addendum)
Chronic, not controlled well despite prozac '40mg'$  daily. Significant family stressors and difficulty with loneliness. Support provided.  ?Will refill hydroxyzine '10mg'$  PRN which she finds beneficial.  ?Discussed options - increasing prozac dose vs adjuvant med vs counseling. She requests counseling referral which was placed today. ?I did recommend she drop tylenol PM dosing to just one at night given anticholinergic concerns.  ?

## 2022-04-12 NOTE — Assessment & Plan Note (Signed)
Chronic, stable today. Home readings largely averaging well controlled as well. Reviewed optimal strategies to ensure accurate readings at home. Continue current regimen.  ?

## 2022-04-12 NOTE — Assessment & Plan Note (Signed)
Appreciate cards eval.  ?Weight stable. Seems euvolemic.  ?

## 2022-04-15 ENCOUNTER — Other Ambulatory Visit: Payer: Self-pay | Admitting: Family Medicine

## 2022-04-17 ENCOUNTER — Other Ambulatory Visit: Payer: Self-pay | Admitting: Family Medicine

## 2022-04-19 ENCOUNTER — Ambulatory Visit (INDEPENDENT_AMBULATORY_CARE_PROVIDER_SITE_OTHER): Payer: Medicare HMO

## 2022-04-19 VITALS — BP 165/80 | Ht 64.0 in | Wt 205.0 lb

## 2022-04-19 DIAGNOSIS — Z0000 Encounter for general adult medical examination without abnormal findings: Secondary | ICD-10-CM | POA: Diagnosis not present

## 2022-04-19 NOTE — Patient Instructions (Signed)
[name] , ?Thank you for taking time to come for your Medicare Wellness Visit. I appreciate your ongoing commitment to your health goals. Please review the following plan we discussed and let me know if I can assist you in the future.  ? ?Screening recommendations/referrals: ?Colonoscopy: not required ?Mammogram: not required ?Bone Density: completed 11/23/2019 ?Recommended yearly ophthalmology/optometry visit for glaucoma screening and checkup ?Recommended yearly dental visit for hygiene and checkup ? ?Vaccinations: ?Influenza vaccine: due 07/30/2022 ?Pneumococcal vaccine: completed 03/10/2014 ?Tdap vaccine: due ?Shingles vaccine: discussed   ?Covid-19: 02/28/2020, 01/31/2020 ? ?Advanced directives: Please bring a copy of your POA (Power of Attorney) and/or Living Will to your next appointment.  ? ?Conditions/risks identified: none ? ?Next appointment: Follow up in one year for your annual wellness visit  ? ? ?Preventive Care 86 Years and Older, Female ?Preventive care refers to lifestyle choices and visits with your health care provider that can promote health and wellness. ?What does preventive care include? ?A yearly physical exam. This is also called an annual well check. ?Dental exams once or twice a year. ?Routine eye exams. Ask your health care provider how often you should have your eyes checked. ?Personal lifestyle choices, including: ?Daily care of your teeth and gums. ?Regular physical activity. ?Eating a healthy diet. ?Avoiding tobacco and drug use. ?Limiting alcohol use. ?Practicing safe sex. ?Taking low-dose aspirin every day. ?Taking vitamin and mineral supplements as recommended by your health care provider. ?What happens during an annual well check? ?The services and screenings done by your health care provider during your annual well check will depend on your age, overall health, lifestyle risk factors, and family history of disease. ?Counseling  ?Your health care provider may ask you questions about  your: ?Alcohol use. ?Tobacco use. ?Drug use. ?Emotional well-being. ?Home and relationship well-being. ?Sexual activity. ?Eating habits. ?History of falls. ?Memory and ability to understand (cognition). ?Work and work Statistician. ?Reproductive health. ?Screening  ?You may have the following tests or measurements: ?Height, weight, and BMI. ?Blood pressure. ?Lipid and cholesterol levels. These may be checked every 5 years, or more frequently if you are over 86 years old. ?Skin check. ?Lung cancer screening. You may have this screening every year starting at age 26 if you have a 30-pack-year history of smoking and currently smoke or have quit within the past 15 years. ?Fecal occult blood test (FOBT) of the stool. You may have this test every year starting at age 86. ?Flexible sigmoidoscopy or colonoscopy. You may have a sigmoidoscopy every 5 years or a colonoscopy every 10 years starting at age 86. ?Hepatitis C blood test. ?Hepatitis B blood test. ?Sexually transmitted disease (STD) testing. ?Diabetes screening. This is done by checking your blood sugar (glucose) after you have not eaten for a while (fasting). You may have this done every 1-3 years. ?Bone density scan. This is done to screen for osteoporosis. You may have this done starting at age 86. ?Mammogram. This may be done every 1-2 years. Talk to your health care provider about how often you should have regular mammograms. ?Talk with your health care provider about your test results, treatment options, and if necessary, the need for more tests. ?Vaccines  ?Your health care provider may recommend certain vaccines, such as: ?Influenza vaccine. This is recommended every year. ?Tetanus, diphtheria, and acellular pertussis (Tdap, Td) vaccine. You may need a Td booster every 10 years. ?Zoster vaccine. You may need this after age 58. ?Pneumococcal 13-valent conjugate (PCV13) vaccine. One dose is recommended after age  65. ?Pneumococcal polysaccharide (PPSV23) vaccine.  One dose is recommended after age 55. ?Talk to your health care provider about which screenings and vaccines you need and how often you need them. ?This information is not intended to replace advice given to you by your health care provider. Make sure you discuss any questions you have with your health care provider. ?Document Released: 01/12/2016 Document Revised: 09/04/2016 Document Reviewed: 10/17/2015 ?Elsevier Interactive Patient Education ? 2017 [place]. ? ?Fall Prevention in the Home ?Falls can cause injuries. They can happen to people of all ages. There are many things you can do to make your home safe and to help prevent falls. ?What can I do on the outside of my home? ?Regularly fix the edges of walkways and driveways and fix any cracks. ?Remove anything that might make you trip as you walk through a door, such as a raised step or threshold. ?Trim any bushes or trees on the path to your home. ?Use bright outdoor lighting. ?Clear any walking paths of anything that might make someone trip, such as rocks or tools. ?Regularly check to see if handrails are loose or broken. Make sure that both sides of any steps have handrails. ?Any raised decks and porches should have guardrails on the edges. ?Have any leaves, snow, or ice cleared regularly. ?Use sand or salt on walking paths during winter. ?Clean up any spills in your garage right away. This includes oil or grease spills. ?What can I do in the bathroom? ?Use night lights. ?Install grab bars by the toilet and in the tub and shower. Do not use towel bars as grab bars. ?Use non-skid mats or decals in the tub or shower. ?If you need to sit down in the shower, use a plastic, non-slip stool. ?Keep the floor dry. Clean up any water that spills on the floor as soon as it happens. ?Remove soap buildup in the tub or shower regularly. ?Attach bath mats securely with double-sided non-slip rug tape. ?Do not have throw rugs and other things on the floor that can make  you trip. ?What can I do in the bedroom? ?Use night lights. ?Make sure that you have a light by your bed that is easy to reach. ?Do not use any sheets or blankets that are too big for your bed. They should not hang down onto the floor. ?Have a firm chair that has side arms. You can use this for support while you get dressed. ?Do not have throw rugs and other things on the floor that can make you trip. ?What can I do in the kitchen? ?Clean up any spills right away. ?Avoid walking on wet floors. ?Keep items that you use a lot in easy-to-reach places. ?If you need to reach something above you, use a strong step stool that has a grab bar. ?Keep electrical cords out of the way. ?Do not use floor polish or wax that makes floors slippery. If you must use wax, use non-skid floor wax. ?Do not have throw rugs and other things on the floor that can make you trip. ?What can I do with my stairs? ?Do not leave any items on the stairs. ?Make sure that there are handrails on both sides of the stairs and use them. Fix handrails that are broken or loose. Make sure that handrails are as long as the stairways. ?Check any carpeting to make sure that it is firmly attached to the stairs. Fix any carpet that is loose or worn. ?Avoid having throw rugs at  the top or bottom of the stairs. If you do have throw rugs, attach them to the floor with carpet tape. ?Make sure that you have a light switch at the top of the stairs and the bottom of the stairs. If you do not have them, ask someone to add them for you. ?What else can I do to help prevent falls? ?Wear shoes that: ?Do not have high heels. ?Have rubber bottoms. ?Are comfortable and fit you well. ?Are closed at the toe. Do not wear sandals. ?If you use a stepladder: ?Make sure that it is fully opened. Do not climb a closed stepladder. ?Make sure that both sides of the stepladder are locked into place. ?Ask someone to hold it for you, if possible. ?Clearly mark and make sure that you can  see: ?Any grab bars or handrails. ?First and last steps. ?Where the edge of each step is. ?Use tools that help you move around (mobility aids) if they are needed. These include: ?Canes. ?Walkers. ?Scoote

## 2022-04-19 NOTE — Progress Notes (Signed)
?I connected with [name] today by telephone and verified that I am speaking with the correct person using two identifiers. ?Location patient: home ?Location provider: work ?Persons participating in the virtual visit: [place], [place] (daughter), Glenna Durand LPN. ?  ?I discussed the limitations, risks, security and privacy concerns of performing an evaluation and management service by telephone and the availability of in person appointments. I also discussed with the patient that there may be a patient responsible charge related to this service. The patient expressed understanding and verbally consented to this telephonic visit.  ?  ?Interactive audio and video telecommunications were attempted between this provider and patient, however failed, due to patient having technical difficulties OR patient did not have access to video capability.  We continued and completed visit with audio only. ? ?  ? ?Vital signs may be patient reported or missing. ? ?Subjective:  ? [name] is a 88 y.o. female who presents for Medicare Annual (Subsequent) preventive examination. ? ?Review of Systems    ? ?Cardiac Risk Factors include: advanced age (>28mn, >>26women);dyslipidemia;hypertension;obesity (BMI >30kg/m2) ? ?   ?Objective:  ?  ?Today's Vitals  ? 04/19/22 1043  ?BP: (!) 165/80  ?Weight: 205 lb (93 kg)  ?Height: '5\' 4"'$  (1.626 m)  ? ?Body mass index is 35.19 kg/m?. ? ? ?  04/19/2022  ? 10:56 AM 12/18/2021  ?  2:00 PM 12/15/2021  ?  9:00 PM 03/12/2020  ?  8:54 AM 01/22/2019  ?  9:15 AM 01/08/2019  ? 10:07 AM 03/31/2018  ? 12:20 PM  ?Advanced Directives  ?Does Patient Have a Medical Advance Directive? Yes Unable to assess, patient is non-responsive or altered mental status Unable to assess, patient is non-responsive or altered mental status No No No No  ?Type of Advance Directive Healthcare Power of Attorney        ?Copy of [place]? No - copy requested        ?Would patient like information on  creating a medical advance directive?  No - Patient declined  No - Patient declined  No - Patient declined No - Patient declined  ? ? ?Current Medications (verified) ?Outpatient Encounter Medications as of 04/19/2022  ?Medication Sig  ? acetaminophen (TYLENOL) 500 MG tablet Take 1,000 mg by mouth daily.  ? albuterol (VENTOLIN HFA) 108 (90 Base) MCG/ACT inhaler Inhale 1 - 2 puffs into the lungs every 6 hours as needed for wheezing or shortness of breath.  ? calcium gluconate 500 MG tablet Take 1 tablet (500 mg total) by mouth daily. (Patient taking differently: Take 500 mg by mouth daily.)  ? cholecalciferol (VITAMIN D) 1000 UNITS tablet Take 1,000 Units by mouth daily.  ? denosumab (PROLIA) 60 MG/ML SOSY injection Inject 60 mg into the skin every 6 (six) months.  ? diphenhydramine-acetaminophen (TYLENOL PM) 25-500 MG TABS tablet Take 1 tablet by mouth at bedtime as needed (sleep).  ? docusate sodium (COLACE) 100 MG capsule Take 100 mg by mouth 2 (two) times daily.  ? famotidine (PEPCID) 20 MG tablet Take 20 mg by mouth daily.  ? FLUoxetine (PROZAC) 40 MG capsule TAKE 1 CAPSULE EVERY DAY  (REPLACES  TABLETS)  ? furosemide (LASIX) 20 MG tablet TAKE 1/2 TABLET (10 MG TOTAL) BY MOUTH 2 (TWO) TIMES DAILY.  ? hydrALAZINE (APRESOLINE) 25 MG tablet Take 1 tablet (25 mg total) by mouth in the morning and at bedtime.  ? hydrOXYzine (ATARAX) 10 MG tablet Take 0.5-1 tablets (5-10  mg total) by mouth 2 (two) times daily as needed for anxiety.  ? levothyroxine (SYNTHROID) 88 MCG tablet TAKE 1 TABLET EVERY DAY BEFORE BREAKFAST  ? lisinopril (ZESTRIL) 40 MG tablet TAKE 1 TABLET EVERY DAY  ? loratadine (CLARITIN) 10 MG tablet TAKE 1 TABLET EVERY EVENING  ? metoprolol succinate (TOPROL-XL) 25 MG 24 hr tablet Take 1 tablet (25 mg total) by mouth daily.  ? MYRBETRIQ 25 MG TB24 tablet TAKE 1 TABLET EVERY DAY  ? omeprazole (PRILOSEC) 40 MG capsule TAKE 1 CAPSULE EVERY DAY  ? polyethylene glycol (MIRALAX / GLYCOLAX) 17 g packet Take 17 g by  mouth daily as needed for mild constipation.  ? pravastatin (PRAVACHOL) 40 MG tablet TAKE 1 TABLET EVERY DAY  ? Propylene Glycol (SYSTANE BALANCE OP) Place 1 drop into both eyes daily as needed (dry eyes).  ? verapamil (CALAN-SR) 240 MG CR tablet TAKE 1 TABLET EVERY DAY (DOSE CHANGE)  ? vitamin B-12 (CYANOCOBALAMIN) 500 MCG tablet Take 1 tablet (500 mcg total) by mouth once a week. (Patient taking differently: Take 500 mcg by mouth once a week. Thursdays)  ? ?No facility-administered encounter medications on file as of 04/19/2022.  ? ? ?Allergies (verified) ?Patient has no known allergies.  ? ?History: ?Past Medical History:  ?Diagnosis Date  ? Acute upper GI bleeding 03/12/2020  ? Arthritis   ? Depression   ? History of anemia   ? History of chicken pox   ? History of ulcer disease 1990s  ? HLD (hyperlipidemia)   ? HTN (hypertension)   ? Hypothyroidism   ? Osteoporosis 03/2016  ? at [facility] - T -3.1 hip, -1.0 spine  ? Uses hearing aid   ? ?Past Surgical History:  ?Procedure Laterality Date  ? APPENDECTOMY  1974  ? [place]  ? BIOPSY  03/13/2020  ? Procedure: BIOPSY;  Surgeon: Ladene Artist, MD;  Location: [facility] ENDOSCOPY;  Service: Endoscopy;;  ? CARDIOVASCULAR STRESS TEST  2010  ? WNL  ? CARPAL TUNNEL RELEASE Left 04/28/2017  ? CATARACT EXTRACTION Bilateral 2012  ? COLONOSCOPY WITH PROPOFOL N/A 03/14/2020  ? single bleeding colonic angiectasia treated with APC (Pyrtle, Lajuan Lines, MD)  ? DEXA  03/2016  ? at [facility] - T -3.1 hip, -1.0 spine  ? ESOPHAGOGASTRODUODENOSCOPY (EGD) WITH PROPOFOL N/A 03/13/2020  ? erosive gastropathy with stigmata of recent bleeding Fuller Plan, Pricilla Riffle, MD)  ? [place]  ? HOT HEMOSTASIS N/A 03/14/2020  ? Procedure: HOT HEMOSTASIS (ARGON PLASMA COAGULATION/BICAP);  Surgeon: Jerene Bears, MD;  Location: [facility] ENDOSCOPY;  Service: Gastroenterology;  Laterality: N/A;  ? [place]  ? right  ? KNEE SURGERY  1998  ? left  ? KNEE SURGERY  2008  ? POLYPECTOMY  03/14/2020  ?  Procedure: POLYPECTOMY;  Surgeon: Jerene Bears, MD;  Location: [facility] ENDOSCOPY;  Service: Gastroenterology;;  ? TONSILLECTOMY  1945  ? US ECHOCARDIOGRAPHY  2010  ? LVEF 65-75%, overall normal, mildly decreased LV diastolic compliance  ? ?Family History  ?Problem Relation Age of Onset  ? Cancer Mother 71  ?     lung, nonsmoker  ? Cancer Father   ?     lung, nonsmoker  ? Cancer Brother 7  ?     lung, smoker  ? Cancer Sister 84  ?     brain  ? Cancer Sister 34  ?     lung, smoker  ? COPD Sister   ?  smoker  ? Cancer Sister 84  ?     colon  ? Hypertension Daughter   ? Diabetes Daughter   ? CAD Neg Hx   ? Stroke Neg Hx   ? ?Social History  ? ?Socioeconomic History  ? Marital status: Widowed  ?  Spouse name: Not on file  ? Number of children: Not on file  ? Years of education: Not on file  ? Highest education level: Not on file  ?Occupational History  ? Not on file  ?Tobacco Use  ? Smoking status: Former  ?  Types: Cigarettes  ?  Quit date: 12/30/1992  ?  Years since quitting: 29.3  ? Smokeless tobacco: Never  ?Vaping Use  ? Vaping Use: Never used  ?Substance and Sexual Activity  ? Alcohol use: No  ? Drug use: No  ? Sexual activity: Not Currently  ?Other Topics Concern  ? Not on file  ?Social History Narrative  ? Widower since 04-08-1999 - husband battled cancer for 6 yrs, died from MI  ? Daughter Delbert Vu) passed away 2015-04-08  ? Lies in retirement home - [place] on [address].  ? Occupation: retired  ? HS: 12th grade  ? Activity: no regular exercise  ? Diet: good water, fruits/vegetables daily  ? ?Social Determinants of Health  ? ?Financial Resource Strain: Low Risk   ? Difficulty of Paying Living Expenses: Not hard at all  ?Food Insecurity: No Food Insecurity  ? Worried About Charity fundraiser in the Last Year: Never true  ? Ran Out of Food in the Last Year: Never true  ?Transportation Needs: No Transportation Needs  ? Lack of Transportation (Medical): No  ? Lack of Transportation (Non-Medical): No  ?Physical Activity:  Insufficiently Active  ? Days of Exercise per Week: 2 days  ? Minutes of Exercise per Session: 50 min  ?Stress: No Stress Concern Present  ? Feeling of Stress : Not at all  ?Social Connections: Not on file  ?

## 2022-04-24 NOTE — Telephone Encounter (Signed)
OOP cost is $300. Patient agrees to proceed. ?Patient would like to keep her appointment for lab and OV that is already scheduled to get this done. ?Lab 05/07/22 and OV 05/14/22 ?

## 2022-04-26 ENCOUNTER — Telehealth: Payer: Self-pay

## 2022-04-26 MED ORDER — HYDRALAZINE HCL 25 MG PO TABS: 25.0000 mg | ORAL_TABLET | Freq: Two times a day (BID) | ORAL | 0 refills | Status: DC

## 2022-04-26 NOTE — Telephone Encounter (Signed)
E-scribed refill 

## 2022-05-06 ENCOUNTER — Other Ambulatory Visit: Payer: Self-pay | Admitting: Family Medicine

## 2022-05-06 DIAGNOSIS — D500 Iron deficiency anemia secondary to blood loss (chronic): Secondary | ICD-10-CM

## 2022-05-06 DIAGNOSIS — E782 Mixed hyperlipidemia: Secondary | ICD-10-CM

## 2022-05-06 DIAGNOSIS — N2581 Secondary hyperparathyroidism of renal origin: Secondary | ICD-10-CM

## 2022-05-06 DIAGNOSIS — M810 Age-related osteoporosis without current pathological fracture: Secondary | ICD-10-CM

## 2022-05-06 DIAGNOSIS — E039 Hypothyroidism, unspecified: Secondary | ICD-10-CM

## 2022-05-06 DIAGNOSIS — N1832 Chronic kidney disease, stage 3b: Secondary | ICD-10-CM

## 2022-05-07 ENCOUNTER — Other Ambulatory Visit (INDEPENDENT_AMBULATORY_CARE_PROVIDER_SITE_OTHER): Payer: Medicare HMO

## 2022-05-07 DIAGNOSIS — M810 Age-related osteoporosis without current pathological fracture: Secondary | ICD-10-CM | POA: Diagnosis not present

## 2022-05-07 DIAGNOSIS — D500 Iron deficiency anemia secondary to blood loss (chronic): Secondary | ICD-10-CM

## 2022-05-07 DIAGNOSIS — N1832 Chronic kidney disease, stage 3b: Secondary | ICD-10-CM

## 2022-05-07 DIAGNOSIS — E782 Mixed hyperlipidemia: Secondary | ICD-10-CM

## 2022-05-07 DIAGNOSIS — N2581 Secondary hyperparathyroidism of renal origin: Secondary | ICD-10-CM | POA: Diagnosis not present

## 2022-05-07 DIAGNOSIS — E039 Hypothyroidism, unspecified: Secondary | ICD-10-CM

## 2022-05-07 LAB — CBC WITH DIFFERENTIAL/PLATELET
Basophils Absolute: 0.1 10*3/uL (ref 0.0–0.1)
Basophils Relative: 1.2 % (ref 0.0–3.0)
Eosinophils Absolute: 0.1 10*3/uL (ref 0.0–0.7)
Eosinophils Relative: 2.6 % (ref 0.0–5.0)
HCT: 33.9 % — ABNORMAL LOW (ref 36.0–46.0)
Hemoglobin: 11 g/dL — ABNORMAL LOW (ref 12.0–15.0)
Lymphocytes Relative: 25.1 % (ref 12.0–46.0)
Lymphs Abs: 1.3 10*3/uL (ref 0.7–4.0)
MCHC: 32.3 g/dL (ref 30.0–36.0)
MCV: 87.1 fl (ref 78.0–100.0)
Monocytes Absolute: 0.6 10*3/uL (ref 0.1–1.0)
Monocytes Relative: 11.9 % (ref 3.0–12.0)
Neutro Abs: 3 10*3/uL (ref 1.4–7.7)
Neutrophils Relative %: 59.2 % (ref 43.0–77.0)
Platelets: 185 10*3/uL (ref 150.0–400.0)
RBC: 3.89 Mil/uL (ref 3.87–5.11)
RDW: 14.1 % (ref 11.5–15.5)
WBC: 5.1 10*3/uL (ref 4.0–10.5)

## 2022-05-07 LAB — FERRITIN: Ferritin: 56.5 ng/mL (ref 10.0–291.0)

## 2022-05-07 LAB — COMPREHENSIVE METABOLIC PANEL
ALT: 10 U/L (ref 0–35)
AST: 13 U/L (ref 0–37)
Albumin: 3.8 g/dL (ref 3.5–5.2)
Alkaline Phosphatase: 47 U/L (ref 39–117)
BUN: 17 mg/dL (ref 6–23)
CO2: 28 mEq/L (ref 19–32)
Calcium: 9.7 mg/dL (ref 8.4–10.5)
Chloride: 107 mEq/L (ref 96–112)
Creatinine, Ser: 1.35 mg/dL — ABNORMAL HIGH (ref 0.40–1.20)
GFR: 34.74 mL/min — ABNORMAL LOW (ref >60.00)
Glucose, Bld: 77 mg/dL (ref 70–99)
Potassium: 4.2 mEq/L (ref 3.5–5.1)
Sodium: 143 mEq/L (ref 135–145)
Total Bilirubin: 0.4 mg/dL (ref 0.2–1.2)
Total Protein: 6.4 g/dL (ref 6.0–8.3)

## 2022-05-07 LAB — LIPID PANEL
Cholesterol: 134 mg/dL (ref 0–200)
HDL: 55.4 mg/dL (ref >39.00)
LDL Cholesterol: 51 mg/dL (ref 0–99)
NonHDL: 78.65
Total CHOL/HDL Ratio: 2
Triglycerides: 138 mg/dL (ref 0.0–149.0)
VLDL: 27.6 mg/dL (ref 0.0–40.0)

## 2022-05-07 LAB — MICROALBUMIN / CREATININE URINE RATIO
Creatinine,U: 39 mg/dL
Microalb Creat Ratio: 1.8 mg/g (ref 0.0–30.0)
Microalb, Ur: <0.7 mg/dL (ref 0.0–1.9)

## 2022-05-07 LAB — IBC PANEL
Iron: 43 ug/dL (ref 42–145)
Saturation Ratios: 15.8 % — ABNORMAL LOW (ref 20.0–50.0)
TIBC: 271.6 ug/dL (ref 250.0–450.0)
Transferrin: 194 mg/dL — ABNORMAL LOW (ref 212.0–360.0)

## 2022-05-07 LAB — VITAMIN D 25 HYDROXY (VIT D DEFICIENCY, FRACTURES): VITD: 43.03 ng/mL (ref 30.00–100.00)

## 2022-05-07 LAB — TSH: TSH: 1.99 u[IU]/mL (ref 0.35–5.50)

## 2022-05-08 LAB — PARATHYROID HORMONE, INTACT (NO CA): PTH: 41 pg/mL (ref 16–77)

## 2022-05-14 ENCOUNTER — Encounter: Payer: Self-pay | Admitting: Family Medicine

## 2022-05-14 ENCOUNTER — Ambulatory Visit (INDEPENDENT_AMBULATORY_CARE_PROVIDER_SITE_OTHER): Payer: Medicare HMO | Admitting: Family Medicine

## 2022-05-14 VITALS — BP 136/84 | HR 62 | Temp 98.0°F | Ht 62.5 in | Wt 213.5 lb

## 2022-05-14 DIAGNOSIS — N1832 Chronic kidney disease, stage 3b: Secondary | ICD-10-CM | POA: Diagnosis not present

## 2022-05-14 DIAGNOSIS — M810 Age-related osteoporosis without current pathological fracture: Secondary | ICD-10-CM | POA: Diagnosis not present

## 2022-05-14 DIAGNOSIS — I10 Essential (primary) hypertension: Secondary | ICD-10-CM

## 2022-05-14 DIAGNOSIS — I5032 Chronic diastolic (congestive) heart failure: Secondary | ICD-10-CM

## 2022-05-14 DIAGNOSIS — N2581 Secondary hyperparathyroidism of renal origin: Secondary | ICD-10-CM

## 2022-05-14 DIAGNOSIS — D500 Iron deficiency anemia secondary to blood loss (chronic): Secondary | ICD-10-CM | POA: Diagnosis not present

## 2022-05-14 DIAGNOSIS — F331 Major depressive disorder, recurrent, moderate: Secondary | ICD-10-CM | POA: Diagnosis not present

## 2022-05-14 DIAGNOSIS — G3184 Mild cognitive impairment, so stated: Secondary | ICD-10-CM | POA: Insufficient documentation

## 2022-05-14 DIAGNOSIS — E039 Hypothyroidism, unspecified: Secondary | ICD-10-CM

## 2022-05-14 DIAGNOSIS — Z7189 Other specified counseling: Secondary | ICD-10-CM

## 2022-05-14 DIAGNOSIS — R413 Other amnesia: Secondary | ICD-10-CM | POA: Insufficient documentation

## 2022-05-14 DIAGNOSIS — L602 Onychogryphosis: Secondary | ICD-10-CM | POA: Insufficient documentation

## 2022-05-14 DIAGNOSIS — Z0000 Encounter for general adult medical examination without abnormal findings: Secondary | ICD-10-CM | POA: Diagnosis not present

## 2022-05-14 DIAGNOSIS — E782 Mixed hyperlipidemia: Secondary | ICD-10-CM

## 2022-05-14 MED ORDER — HYDRALAZINE HCL 25 MG PO TABS: 25.0000 mg | ORAL_TABLET | Freq: Two times a day (BID) | ORAL | 3 refills | Status: DC

## 2022-05-14 MED ORDER — DENOSUMAB 60 MG/ML ~~LOC~~ SOSY
60.0000 mg | PREFILLED_SYRINGE | Freq: Once | SUBCUTANEOUS | Status: AC
  Administered 2022-05-14: 60 mg via SUBCUTANEOUS

## 2022-05-14 NOTE — Assessment & Plan Note (Signed)
Failed 6CIT screen with health advisor.  ?CTD 3/4 today, MMSE 26/30. Overall stable period.  ?No need for medication at this time. Will continue to monitor.  ?

## 2022-05-14 NOTE — Assessment & Plan Note (Signed)
Chronic, stable on low potency pravastatin '40mg'$  daily.  ?The ASCVD Risk score (Arnett DK, et al., 2019) failed to calculate for the following reasons: ?  The 2019 ASCVD risk score is only valid for ages 60 to 27  ?

## 2022-05-14 NOTE — Patient Instructions (Addendum)
Prolia shot today.  ?Call counselor to schedule appointment at [facility] [phone].  ?Call to schedule mammogram at your convenience anytime over the next year: [place] in [place] [phone] ?Send me dates of COVID booster.  ?If interested, check with pharmacy about new 2 shot shingles series (shingrix).  ?Bring [place] a copy of your living will/advanced directive.  ?Return as needed or in 4 months for follow up visit.  ?Hope you have a great birthday next month!  ? ?Health Maintenance After Age 38 ?After age 49, you are at a higher risk for certain long-term diseases and infections as well as injuries from falls. Falls are a major cause of broken bones and head injuries in people who are older than age 9. Getting regular preventive care can help to keep you healthy and well. Preventive care includes getting regular testing and making lifestyle changes as recommended by your health care provider. Talk with your health care provider about: ?Which screenings and tests you should have. A screening is a test that checks for a disease when you have no symptoms. ?A diet and exercise plan that is right for you. ?What should I know about screenings and tests to prevent falls? ?Screening and testing are the best ways to find a health problem early. Early diagnosis and treatment give you the best chance of managing medical conditions that are common after age 72. Certain conditions and lifestyle choices may make you more likely to have a fall. Your health care provider may recommend: ?Regular vision checks. Poor vision and conditions such as cataracts can make you more likely to have a fall. If you wear glasses, make sure to get your prescription updated if your vision changes. ?Medicine review. Work with your health care provider to regularly review all of the medicines you are taking, including over-the-counter medicines. Ask your health care provider about any side effects that may make you more likely  to have a fall. Tell your health care provider if any medicines that you take make you feel dizzy or sleepy. ?Strength and balance checks. Your health care provider may recommend certain tests to check your strength and balance while standing, walking, or changing positions. ?Foot health exam. Foot pain and numbness, as well as not wearing proper footwear, can make you more likely to have a fall. ?Screenings, including: ?Osteoporosis screening. Osteoporosis is a condition that causes the bones to get weaker and break more easily. ?Blood pressure screening. Blood pressure changes and medicines to control blood pressure can make you feel dizzy. ?Depression screening. You may be more likely to have a fall if you have a fear of falling, feel depressed, or feel unable to do activities that you used to do. ?Alcohol use screening. Using too much alcohol can affect your balance and may make you more likely to have a fall. ?Follow these instructions at home: ?Lifestyle ?Do not drink alcohol if: ?Your health care provider tells you not to drink. ?If you drink alcohol: ?Limit how much you have to: ?0-1 drink a day for women. ?0-2 drinks a day for men. ?Know how much alcohol is in your drink. In the [place] one drink equals one 12 oz bottle of beer (355 mL), one 5 oz glass of wine (148 mL), or one 1? oz glass of hard liquor (44 mL). ?Do not use any products that contain nicotine or tobacco. These products include cigarettes, chewing tobacco, and vaping devices, such as e-cigarettes. If you need help quitting, ask your health care  provider. ?Activity ? ?Follow a regular exercise program to stay fit. This will help you maintain your balance. Ask your health care provider what types of exercise are appropriate for you. ?If you need a cane or walker, use it as recommended by your health care provider. ?Wear supportive shoes that have nonskid soles. ?Safety ? ?Remove any tripping hazards, such as rugs, cords, and clutter. ?Install  safety equipment such as grab bars in bathrooms and safety rails on stairs. ?Keep rooms and walkways well-lit. ?General instructions ?Talk with your health care provider about your risks for falling. Tell your health care provider if: ?You fall. Be sure to tell your health care provider about all falls, even ones that seem minor. ?You feel dizzy, tiredness (fatigue), or off-balance. ?Take over-the-counter and prescription medicines only as told by your health care provider. These include supplements. ?Eat a healthy diet and maintain a healthy weight. A healthy diet includes low-fat dairy products, low-fat (lean) meats, and fiber from whole grains, beans, and lots of fruits and vegetables. ?Stay current with your vaccines. ?Schedule regular health, dental, and eye exams. ?Summary ?Having a healthy lifestyle and getting preventive care can help to protect your health and wellness after age 3. ?Screening and testing are the best way to find a health problem early and help you avoid having a fall. Early diagnosis and treatment give you the best chance for managing medical conditions that are more common for people who are older than age 72. ?Falls are a major cause of broken bones and head injuries in people who are older than age 43. Take precautions to prevent a fall at home. ?Work with your health care provider to learn what changes you can make to improve your health and wellness and to prevent falls. ?This information is not intended to replace advice given to you by your health care provider. Make sure you discuss any questions you have with your health care provider. ?Document Revised: 05/07/2021 Document Reviewed: 05/07/2021 ?Elsevier Patient Education ? [place]. ? ?

## 2022-05-14 NOTE — Assessment & Plan Note (Signed)
Calcium now normal off supplementation.  ?

## 2022-05-14 NOTE — Assessment & Plan Note (Addendum)
Advanced directives: has not set up. Would want daughter [name] then granddaughter Bjorn Loser to be HCPOA. Packet previously provided - advised to bring [place] a copy. Doesn't want prolonged life support.  ?

## 2022-05-14 NOTE — Progress Notes (Signed)
? ? Patient ID: [name], female    DOB: 1930/11/22, 88 y.o.   MRN: [id number] ? ?This visit was conducted in person. ? ?BP 136/84   Pulse 62   Temp 98 ?F (36.7 ?C) (Temporal)   Ht 5' 2.5" (1.588 m)   Wt 213 lb 8 oz (96.8 kg)   SpO2 97%   BMI 38.43 kg/m?   ? ?CC: CPE ?Subjective:  ? ?HPI: ?[name] is a 88 y.o. female presenting on 05/14/2022 for Annual Exam [facility] prt 2. Pt accompanied by daughter, [name]. ) ? ? ?Saw health advisor last week for medicare wellness visit. Note reviewed. Failed cognitive assessment (6CIT score of 14). She notes occasional forgetfulness. See below.  ? ?Granddaughter just graduated with doctorate from nursing school.  ? ?No results found.  ?Flowsheet Row Clinical Support from 04/19/2022 in [place] at [facility]  ?PHQ-2 Total Score 0  ? ?  ?  ? ?  04/19/2022  ? 10:57 AM 10/11/2020  ?  2:18 PM 09/01/2019  ? 11:33 AM 03/31/2018  ? 12:05 PM 03/19/2017  ? 10:39 AM  ?Fall Risk   ?Falls in the past year? 0 0 0 Yes No  ?Comment    fell on birthday in 2018 after stepping up on curb; bruising only with no medical treatment   ?Number falls in past yr: 0  0 1   ?Injury with Fall? 0  1 Yes   ?Risk for fall due to : Impaired balance/gait;Impaired mobility;Medication side effect   Impaired balance/gait;Impaired mobility   ?Follow up Falls evaluation completed;Education provided;Falls prevention discussed      ? ?Last visit we added hydroxyzine '10mg'$  PRN to her regular prozac '40mg'$  due to worsening depressed mood. We also referred to counseling - they were never contacted, # provided again today.  ? ?IDA saw GI s/p EGD and colonoscopy - single bleeding colonic angiectasia s/p APC. Also found erosive gastropathy with stigmata of bleeding. She received IV iron early 2021 as well as 1u blood transfusion. rec PCP continue to monitor this, refer back to GI if needed. Continues pepcid and omeprazole daily. No blood in stool or black tarry stools.  ? ?HTN - brings log of home readings  115-165/65-80s.  ? ?MMSE today: 26/30 ?CDT = 3/4 ? ?By the way .. requests podiatry referral for L great toe thickening and pain  ? ?Preventative: ?Colonoscopy - had normal colonoscopy per pt years ago.  Normal iFOB 2013.  Has aged out. ?Mammogram - 11/2020, Birads1 @ [place]. Does do breast exams at home.  ?Well woman - aged out. Declines pelvic exam.  ?DEXA 03/2016 at [facility] - T -3.1 hip, -1.0 spine. DEXA 10/2019 - T -3.1 femur, -0.9 spine. Last prolia injection 09/2021  ?Lung cancer screening - not eligible  ?Flu - yearly ?Pneumovax - 09/2012.  Prevnar-13 - 02/2014 ?COVID vaccine - Moderna 01/2020, 02/2020, 1 booster ?Tetanus - 2010  ?zostavax - 12/2011  ?shingrix - discussed, to check at pharmacy ?Advanced directives: has not set up. Would want daughter [name] then granddaughter Bjorn Loser to be HCPOA. Packet previously provided - advised to bring [place] a copy. Doesn't want prolonged life support.  ?Seat belt use discussed  ?Sunscreen use discussed. No changing moles on skin.  ?Non smoker ?Alcohol - none  ?Dentist - does not see - has full dentures  ?Eye exam - yearly  ?Bowels - no constipation ?Bladder - some urge incontinence especially with caffeine, she wears pads  ? ?Widower  since 03-25-99 - husband battled cancer for 6 yrs, died from MI. Daughter Niah Heinle) passed away 03-25-2015. Other daughter [name] nearby.  ?Lives in retirement home - [facility] on [address].  ?Occupation: retired  ?HS: 12th grade  ?Activity: no regular exercise  ?Diet: good water, fruits/vegetables daily  ?   ? ?Relevant past medical, surgical, family and social history reviewed and updated as indicated. Interim medical history since our last visit reviewed. ?Allergies and medications reviewed and updated. ?Outpatient Medications Prior to Visit  ?Medication Sig Dispense Refill  ? acetaminophen (TYLENOL) 500 MG tablet Take 1,000 mg by mouth daily.    ? albuterol (VENTOLIN HFA) 108 (90 Base) MCG/ACT inhaler Inhale 1 - 2 puffs into the lungs every  6 hours as needed for wheezing or shortness of breath. 18 g 0  ? calcium gluconate 500 MG tablet Take 1 tablet (500 mg total) by mouth daily. (Patient taking differently: Take 500 mg by mouth daily.)    ? cholecalciferol (VITAMIN D) 1000 UNITS tablet Take 1,000 Units by mouth daily.    ? denosumab (PROLIA) 60 MG/ML SOSY injection Inject 60 mg into the skin every 6 (six) months.    ? diphenhydramine-acetaminophen (TYLENOL PM) 25-500 MG TABS tablet Take 1 tablet by mouth at bedtime as needed (sleep).    ? docusate sodium (COLACE) 100 MG capsule Take 100 mg by mouth 2 (two) times daily.    ? famotidine (PEPCID) 20 MG tablet Take 20 mg by mouth daily.    ? FLUoxetine (PROZAC) 40 MG capsule TAKE 1 CAPSULE EVERY DAY  (REPLACES  TABLETS) 90 capsule 2  ? furosemide (LASIX) 20 MG tablet TAKE 1/2 TABLET (10 MG TOTAL) BY MOUTH 2 (TWO) TIMES DAILY. 90 tablet 0  ? hydrOXYzine (ATARAX) 10 MG tablet Take 0.5-1 tablets (5-10 mg total) by mouth 2 (two) times daily as needed for anxiety. 30 tablet 1  ? levothyroxine (SYNTHROID) 88 MCG tablet TAKE 1 TABLET EVERY DAY BEFORE BREAKFAST 90 tablet 2  ? lisinopril (ZESTRIL) 40 MG tablet TAKE 1 TABLET EVERY DAY 90 tablet 0  ? loratadine (CLARITIN) 10 MG tablet TAKE 1 TABLET EVERY EVENING 90 tablet 0  ? metoprolol succinate (TOPROL-XL) 25 MG 24 hr tablet Take 1 tablet (25 mg total) by mouth daily. 90 tablet 2  ? MYRBETRIQ 25 MG TB24 tablet TAKE 1 TABLET EVERY DAY 90 tablet 2  ? omeprazole (PRILOSEC) 40 MG capsule TAKE 1 CAPSULE EVERY DAY 90 capsule 2  ? polyethylene glycol (MIRALAX / GLYCOLAX) 17 g packet Take 17 g by mouth daily as needed for mild constipation. 14 each 0  ? pravastatin (PRAVACHOL) 40 MG tablet TAKE 1 TABLET EVERY DAY 90 tablet 0  ? Propylene Glycol (SYSTANE BALANCE OP) Place 1 drop into both eyes daily as needed (dry eyes).    ? verapamil (CALAN-SR) 240 MG CR tablet TAKE 1 TABLET EVERY DAY (DOSE CHANGE) 90 tablet 0  ? vitamin B-12 (CYANOCOBALAMIN) 500 MCG tablet Take 1  tablet (500 mcg total) by mouth once a week. (Patient taking differently: Take 500 mcg by mouth once a week. Thursdays)    ? hydrALAZINE (APRESOLINE) 25 MG tablet Take 1 tablet (25 mg total) by mouth in the morning and at bedtime. 60 tablet 0  ? ?No facility-administered medications prior to visit.  ?  ? ?Per HPI unless specifically indicated in ROS section below ?Review of Systems  ?Constitutional:  Negative for activity change, appetite change, chills, fatigue, fever and unexpected weight change.  ?HENT:  Negative for hearing loss.   ?Eyes:  Negative for visual disturbance.  ?Respiratory:  Negative for cough, chest tightness, shortness of breath and wheezing.   ?Cardiovascular:  Negative for chest pain, palpitations and leg swelling.  ?Gastrointestinal:  Negative for abdominal distention, abdominal pain, blood in stool, constipation, diarrhea, nausea and vomiting.  ?Genitourinary:  Negative for difficulty urinating and hematuria.  ?Musculoskeletal:  Negative for arthralgias, myalgias and neck pain.  ?Skin:  Negative for rash.  ?Neurological:  Negative for dizziness, seizures, syncope and headaches.  ?Hematological:  Negative for adenopathy. Does not bruise/bleed easily.  ?Psychiatric/Behavioral:  Negative for dysphoric mood. The patient is not nervous/anxious.   ? ?Objective:  ?BP 136/84   Pulse 62   Temp 98 ?F (36.7 ?C) (Temporal)   Ht 5' 2.5" (1.588 m)   Wt 213 lb 8 oz (96.8 kg)   SpO2 97%   BMI 38.43 kg/m?   ?Wt Readings from Last 3 Encounters:  ?05/14/22 213 lb 8 oz (96.8 kg)  ?04/19/22 205 lb (93 kg)  ?04/12/22 218 lb (98.9 kg)  ?  ?  ?Physical Exam ?Vitals and nursing note reviewed.  ?Constitutional:   ?   Appearance: Normal appearance. She is not ill-appearing.  ?HENT:  ?   Head: Normocephalic and atraumatic.  ?   Right Ear: Tympanic membrane, ear canal and external ear normal. There is no impacted cerumen.  ?   Left Ear: Tympanic membrane, ear canal and external ear normal. There is no impacted  cerumen.  ?Eyes:  ?   General:     ?   Right eye: No discharge.     ?   Left eye: No discharge.  ?   Extraocular Movements: Extraocular movements intact.  ?   Conjunctiva/sclera: Conjunctivae normal.  ?   Pupils: Pupils are equal, r

## 2022-05-14 NOTE — Assessment & Plan Note (Signed)
GFR stable low 30s.  Encourage weaning efforts towards good hydration status and tight blood pressure control. ?

## 2022-05-14 NOTE — Assessment & Plan Note (Signed)
Chronic, stable period. Continue lasix.  ?

## 2022-05-14 NOTE — Assessment & Plan Note (Signed)
She has difficulty cutting her nails. Will refer to podiatry per pt request.  ?

## 2022-05-14 NOTE — Telephone Encounter (Signed)
Pt received Prolia inj at today's OV.  Fyi to Anastasiya. ?

## 2022-05-14 NOTE — Assessment & Plan Note (Signed)
Chronic, overall stable period. Hydroxyzine addition seems to have helped. Continue prozac '40mg'$ , hydroxyzine '10mg'$  PRN. # provided to call to schedule counselor.  ?

## 2022-05-14 NOTE — Assessment & Plan Note (Signed)
Chronic, stable on levothyroxine 45mg daily.  ?

## 2022-05-14 NOTE — Assessment & Plan Note (Addendum)
Chronic difficult to control hypertension, managed with 5 drug regimen including lasix. Continue this.  ?

## 2022-05-14 NOTE — Assessment & Plan Note (Addendum)
Mild anemia stable, ferritin levels stable off oral iron replacement. Low TIBC points to anemia of chronic disease.  ?

## 2022-05-14 NOTE — Assessment & Plan Note (Signed)
Preventative protocols reviewed and updated unless pt declined. Discussed healthy diet and lifestyle.  

## 2022-05-14 NOTE — Assessment & Plan Note (Addendum)
CrCl = 31 (adjusted weight) ?Prolia shot today.  ?

## 2022-05-14 NOTE — Assessment & Plan Note (Signed)
Levels have normalized. Will continue to monitor.  ?Consider phosphate restriction and calcitriol if PTH remains persistently elevated.  ?

## 2022-05-15 ENCOUNTER — Telehealth: Payer: Self-pay | Admitting: Family Medicine

## 2022-05-15 NOTE — Telephone Encounter (Signed)
[place] counselor she can try:  ?[facility] [phone].  ?

## 2022-05-15 NOTE — Telephone Encounter (Signed)
[name] (Daughter) called and stated her mom was seen by Dr. Danise Mina on 05/14/2022 for a physical. She said he referred her to a counselor in [place] but put her number in the note instead of the counselor's. Daughter would like to have the right number to call and schedule.  ? ?Callback Number: [phone] ? ? ?

## 2022-05-16 NOTE — Telephone Encounter (Signed)
Spoke with pt's daughter, [name] (on dpr), relaying Dr. Synthia Innocent message.  Expresses her thanks.

## 2022-05-20 ENCOUNTER — Other Ambulatory Visit: Payer: Self-pay | Admitting: Family Medicine

## 2022-05-24 ENCOUNTER — Ambulatory Visit: Payer: Medicare HMO | Admitting: Podiatry

## 2022-05-24 DIAGNOSIS — M79675 Pain in left toe(s): Secondary | ICD-10-CM

## 2022-05-24 DIAGNOSIS — B351 Tinea unguium: Secondary | ICD-10-CM

## 2022-05-24 DIAGNOSIS — M79674 Pain in right toe(s): Secondary | ICD-10-CM

## 2022-05-27 NOTE — Progress Notes (Signed)
Subjective:   Patient ID: [name], female   DOB: 88 y.o.   MRN: [id number]   HPI 88 year old female presents the office today with concerns of her nails becoming thickened discolored particular right big toenail looks like it is coming loose as well.  No swelling redness or any drainage.  She denies any open lesions.  No recent treatment.  No other concerns.   Review of Systems  All other systems reviewed and are negative.  Past Medical History:  Diagnosis Date   Acute upper GI bleeding 03/12/2020   Arthritis    Depression    History of anemia    History of chicken pox    History of ulcer disease 1990s   HLD (hyperlipidemia)    HTN (hypertension)    Hypothyroidism    Osteoporosis 03/2016   at [place] - T -3.1 hip, -1.0 spine   Uses hearing aid     Past Surgical History:  Procedure Laterality Date   [place]   BIOPSY  03/13/2020   Procedure: BIOPSY;  Surgeon: Ladene Artist, MD;  Location: [facility] ENDOSCOPY;  Service: Endoscopy;;   CARDIOVASCULAR STRESS TEST  2010   WNL   CARPAL TUNNEL RELEASE Left 04/28/2017   CATARACT EXTRACTION Bilateral 2012   COLONOSCOPY WITH PROPOFOL N/A 03/14/2020   single bleeding colonic angiectasia treated with APC Hilarie Fredrickson, Lajuan Lines, MD)   DEXA  03/2016   at [place] - T -3.1 hip, -1.0 spine   ESOPHAGOGASTRODUODENOSCOPY (EGD) WITH PROPOFOL N/A 03/13/2020   erosive gastropathy with stigmata of recent bleeding Ladene Artist, MD)   [place] N/A 03/14/2020   Procedure: HOT HEMOSTASIS (ARGON PLASMA COAGULATION/BICAP);  Surgeon: Jerene Bears, MD;  Location[facility] ENDOSCOPY;  Service: Gastroenterology;  Laterality: N/A;   KNEE SURGERY  1987   right   KNEE SURGERY  1998   left   KNEE SURGERY  2008   POLYPECTOMY  03/14/2020   Procedure: POLYPECTOMY;  Surgeon: Jerene Bears, MD;  Location: [facility] ENDOSCOPY;  Service: Gastroenterology;;   TONSILLECTOMY  1945   US ECHOCARDIOGRAPHY  2010   LVEF  65-75%, overall normal, mildly decreased LV diastolic compliance     Current Outpatient Medications:    acetaminophen (TYLENOL) 500 MG tablet, Take 1,000 mg by mouth daily., Disp: , Rfl:    albuterol (VENTOLIN HFA) 108 (90 Base) MCG/ACT inhaler, Inhale 1 - 2 puffs into the lungs every 6 hours as needed for wheezing or shortness of breath., Disp: 18 g, Rfl: 0   calcium gluconate 500 MG tablet, Take 1 tablet (500 mg total) by mouth daily. (Patient taking differently: Take 500 mg by mouth daily.), Disp: , Rfl:    cholecalciferol (VITAMIN D) 1000 UNITS tablet, Take 1,000 Units by mouth daily., Disp: , Rfl:    denosumab (PROLIA) 60 MG/ML SOSY injection, Inject 60 mg into the skin every 6 (six) months., Disp: , Rfl:    diphenhydramine-acetaminophen (TYLENOL PM) 25-500 MG TABS tablet, Take 1 tablet by mouth at bedtime as needed (sleep)., Disp: , Rfl:    docusate sodium (COLACE) 100 MG capsule, Take 100 mg by mouth 2 (two) times daily., Disp: , Rfl:    famotidine (PEPCID) 20 MG tablet, Take 20 mg by mouth daily., Disp: , Rfl:    FLUoxetine (PROZAC) 40 MG capsule, TAKE 1 CAPSULE EVERY DAY  (REPLACES  TABLETS), Disp: 90 capsule, Rfl: 2   furosemide (LASIX) 20 MG  tablet, TAKE 1/2 TABLET (10 MG TOTAL) BY MOUTH 2 (TWO) TIMES DAILY., Disp: 90 tablet, Rfl: 0   hydrALAZINE (APRESOLINE) 25 MG tablet, Take 1 tablet (25 mg total) by mouth in the morning and at bedtime., Disp: 180 tablet, Rfl: 3   hydrOXYzine (ATARAX) 10 MG tablet, Take 0.5-1 tablets (5-10 mg total) by mouth 2 (two) times daily as needed for anxiety., Disp: 30 tablet, Rfl: 1   levothyroxine (SYNTHROID) 88 MCG tablet, TAKE 1 TABLET EVERY DAY BEFORE BREAKFAST, Disp: 90 tablet, Rfl: 2   lisinopril (ZESTRIL) 40 MG tablet, TAKE 1 TABLET EVERY DAY, Disp: 90 tablet, Rfl: 0   loratadine (CLARITIN) 10 MG tablet, TAKE 1 TABLET EVERY EVENING, Disp: 90 tablet, Rfl: 0   metoprolol succinate (TOPROL-XL) 25 MG 24 hr tablet, TAKE 1 TABLET EVERY DAY, Disp: 90  tablet, Rfl: 3   MYRBETRIQ 25 MG TB24 tablet, TAKE 1 TABLET EVERY DAY, Disp: 90 tablet, Rfl: 2   omeprazole (PRILOSEC) 40 MG capsule, TAKE 1 CAPSULE EVERY DAY, Disp: 90 capsule, Rfl: 2   polyethylene glycol (MIRALAX / GLYCOLAX) 17 g packet, Take 17 g by mouth daily as needed for mild constipation., Disp: 14 each, Rfl: 0   pravastatin (PRAVACHOL) 40 MG tablet, TAKE 1 TABLET EVERY DAY, Disp: 90 tablet, Rfl: 0   Propylene Glycol (SYSTANE BALANCE OP), Place 1 drop into both eyes daily as needed (dry eyes)., Disp: , Rfl:    verapamil (CALAN-SR) 240 MG CR tablet, TAKE 1 TABLET EVERY DAY (DOSE CHANGE), Disp: 90 tablet, Rfl: 0   vitamin B-12 (CYANOCOBALAMIN) 500 MCG tablet, Take 1 tablet (500 mcg total) by mouth once a week. (Patient taking differently: Take 500 mcg by mouth once a week. Thursdays), Disp: , Rfl:   No Known Allergies        Objective:  Physical Exam  General: NAD- HARD OF HEARING  Dermatological: Nails are hypertrophic, dystrophic, brittle, yellow-brown discoloration, elongated 10.  Most of the right hallux toenail skin is hypertrophic.  Somewhat loose distally but the nail overall is adhered to the nailbed.  No surrounding redness or drainage. Tenderness nails 1-5 bilaterally. No open lesions or pre-ulcerative lesions are identified today.  Vascular: Dorsalis Pedis artery and Posterior Tibial artery pedal pulses are palpable bilateral with immedate capillary fill time.  There is no pain with calf compression, swelling, warmth, erythema.   Neruologic: Grossly intact via light touch bilateral.   Musculoskeletal: Hammertoes present.  No other areas of discomfort.      Assessment:   Symptomatic onychomycosis     Plan:  -Treatment options discussed including all alternatives, risks, and complications -Etiology of symptoms were discussed -Nails debrided 10 without complications or bleeding except on the right third toe there is minimal bleeding the distal portion of the toe  from where there was skin under the toenail.  No laceration is noted.  Areas cleaned and antibiotic ointment and bandage applied.  Monitor for any signs or symptoms of infection or if not healed the next 1 to 2 weeks to let me know. -Daily foot inspection -Follow-up in 3 months or sooner if any problems arise. In the meantime, encouraged to call the office with any questions, concerns, change in symptoms.   Celesta Gentile, DPM

## 2022-06-24 ENCOUNTER — Other Ambulatory Visit: Payer: Self-pay | Admitting: Family Medicine

## 2022-07-12 NOTE — Telephone Encounter (Signed)
error 

## 2022-07-15 ENCOUNTER — Other Ambulatory Visit: Payer: Self-pay | Admitting: Family Medicine

## 2022-07-24 ENCOUNTER — Ambulatory Visit (INDEPENDENT_AMBULATORY_CARE_PROVIDER_SITE_OTHER)
Admission: RE | Admit: 2022-07-24 | Discharge: 2022-07-24 | Disposition: A | Discharge status: Home / Self Care | Payer: Medicare HMO | Source: Ambulatory Visit | Attending: Family Medicine | Admitting: Family Medicine

## 2022-07-24 ENCOUNTER — Encounter: Payer: Self-pay | Admitting: Family Medicine

## 2022-07-24 ENCOUNTER — Telehealth: Payer: Self-pay | Admitting: Family Medicine

## 2022-07-24 ENCOUNTER — Ambulatory Visit (INDEPENDENT_AMBULATORY_CARE_PROVIDER_SITE_OTHER): Payer: Medicare HMO | Admitting: Family Medicine

## 2022-07-24 VITALS — BP 160/70 | HR 79 | Temp 98.4°F | Ht 62.5 in | Wt 220.0 lb

## 2022-07-24 DIAGNOSIS — M79644 Pain in right finger(s): Secondary | ICD-10-CM

## 2022-07-24 DIAGNOSIS — Z96651 Presence of right artificial knee joint: Secondary | ICD-10-CM

## 2022-07-24 DIAGNOSIS — M25561 Pain in right knee: Secondary | ICD-10-CM

## 2022-07-24 DIAGNOSIS — M19041 Primary osteoarthritis, right hand: Secondary | ICD-10-CM | POA: Diagnosis not present

## 2022-07-24 DIAGNOSIS — S56419A Strain of extensor muscle, fascia and tendon of finger, unspecified finger at forearm level, initial encounter: Secondary | ICD-10-CM | POA: Diagnosis not present

## 2022-07-24 DIAGNOSIS — M11241 Other chondrocalcinosis, right hand: Secondary | ICD-10-CM | POA: Diagnosis not present

## 2022-07-24 MED ORDER — HYDROXYZINE HCL 10 MG PO TABS: 5.0000 mg | ORAL_TABLET | Freq: Two times a day (BID) | ORAL | 0 refills | Status: DC | PRN

## 2022-07-24 NOTE — Telephone Encounter (Signed)
Placed in Dr. G's box.  

## 2022-07-24 NOTE — Progress Notes (Signed)
Brittany Tagliaferro T. Dylynn Ketner, MD, [place] at [facility] [place], [address]  Phone: [phone]  FAX: [phone]  REIS Wu - 88 y.o. female  MRN [id number]  Date of Birth: February 06, 1930  Date: 07/24/2022  PCP: [name], MD  Referral: [name], MD  Chief Complaint  Patient presents with  . Finger Injury    [place] [address] out of bed on 07/14/22-Right Ring Finger  . Bruise on Right Knee   Subjective:   [name] is a 88 y.o. very pleasant female patient with Body mass index is 39.6 kg/m. who presents with the following:  Pleasant young lady who presents with a right-sided fourth digit injury after she fell on and out of the bed.  Date of injury July 14, 2022.  The exact mechanism is unclear, but she did fall out of bed and since then she has had some significant pain at the fourth digit.  She also has a flexion deformity at the PIP joint on the fourth digit on the right.  There is also some adjacent swelling.  No bruising.  She is unable to extend the finger at all.  She also fell and landed on her right knee, this is a history of having 2 prior knee arthroplasties.  She predominantly has pain anteriorly and she also has some bruising anteriorly.  She is having trouble getting up and rising from a seated position, but she has some issues and has had some issues with this for some time according to her daughter.  Daughter does provide additional history.  [name] [phone]  Review of Systems is noted in the HPI, as appropriate  Objective:   BP (!) 160/70   Pulse 79   Temp 98.4 F (36.9 C) (Oral)   Ht 5' 2.5" (1.588 m)   Wt 220 lb (99.8 kg)   SpO2 97%   BMI 39.60 kg/m   GEN: No acute distress; alert,appropriate. PULM: Breathing comfortably in no respiratory distress PSYCH: Normally interactive.   Right hand is notable for swelling about the fourth digit.  There is a lot of swelling at the PIP  joint on the fourth digit.  The fourth digit is flexed to 90 degrees at the PIP joint.  She is able to manually open this, however quite a bit of difficulty extending at the finger.  Extension is predominantly limited at the PIP joint.  She is able to extend somewhat at the DIP joint, but less compared to contralateral side.  She also has some decreased flexor strength at the fourth digit at the PIP and DIP joints as well.  Patient was examined seated.  She does have tenderness in the anterior aspect of the knee into a lesser extent at the joint line.  No significant tenderness to the proximal tibia or fibula.  Stable to varus and valgus stress.  There is some swelling overall.  Prior knee arthroplasty scar.  Laboratory and Imaging Data: DG Hand Complete Right  Result Date: 07/24/2022 CLINICAL DATA:  Fourth digit pain EXAM: RIGHT HAND - COMPLETE 4 VIEW COMPARISON:  None Available. FINDINGS: No evidence of fracture or dislocation. Moderate degenerative changes of the distal IP joints. Mild degenerative changes of the proximal IP joints and MCP joints. Chondrocalcinosis of the MCP joints and area of the triangular fibrocartilage complex. Diffuse demineralization. Well corticated ossicle adjacent to the distal ulna, likely sequela of remote prior injury. Soft tissues are unremarkable. IMPRESSION: 1. No acute osseous  abnormality. 2. Chondrocalcinosis, findings can be seen in the setting of CPPD arthropathy. Electronically Signed   By: Yetta Glassman M.D.   On: 07/24/2022 12:15   DG Knee Complete 4 Views Right  Result Date: 07/24/2022 CLINICAL DATA:  Right knee pain since falling from bed 2 weeks ago. Previous right total knee arthroplasty. EXAM: RIGHT KNEE - COMPLETE 4+ VIEW COMPARISON:  None Available. FINDINGS: Status post right total knee arthroplasty. The hardware appears intact without loosening. The bones are demineralized. There is no evidence of acute fracture, dislocation or significant knee  joint effusion. Femoral arterial atherosclerosis noted. IMPRESSION: No evidence of acute fracture or dislocation. Intact total knee arthroplasty. Electronically Signed   By: Richardean Sale M.D.   On: 07/24/2022 12:10     Assessment and Plan:     ICD-10-CM   1. Rupture of extensor tendon of finger  S56.419A Ambulatory referral to Hand Surgery    CANCELED: Ambulatory referral to Hand Surgery    CANCELED: Ambulatory referral to Hand Surgery    2. Acute pain of right knee  M25.561 DG Knee Complete 4 Views Right    3. Pain in finger of right hand  M79.644 DG Hand Complete Right    Ambulatory referral to Hand Surgery    CANCELED: Ambulatory referral to Hand Surgery    CANCELED: Ambulatory referral to Hand Surgery    4. History of knee replacement procedure of right knee  Z96.651 DG Knee Complete 4 Views Right     No fracture, but I think that the patient's extensor mechanism on the fourth digit is compromised, and I suspect that she ruptured this while having her traumatic fall out of the bed.  I placed her in an extension splint, but I think that this needs to have more definitive evaluation and care.  I will consult hand surgery.  She also has a traumatic fall to the knee.  No fracture.  Arthroplasty hardware is stable.  Rest and time.  Medication Management during today's office visit: Meds ordered this encounter  Medications  . hydrOXYzine (ATARAX) 10 MG tablet    Sig: Take 0.5-1 tablets (5-10 mg total) by mouth 2 (two) times daily as needed for anxiety.    Dispense:  90 tablet    Refill:  0   Medications Discontinued During This Encounter  Medication Reason  . hydrOXYzine (ATARAX) 10 MG tablet Reorder    Orders placed today for conditions managed today: Orders Placed This Encounter  Procedures  . DG Hand Complete Right  . DG Knee Complete 4 Views Right  . Ambulatory referral to Hand Surgery    Follow-up if needed: No follow-ups on file.  Dragon [employer] One speech-to-text  software was used for transcription in this dictation.  Possible transcriptional errors can occur using Editor, commissioning.   Signed,  Maud Deed. Angi Goodell, MD   Outpatient Encounter Medications as of 07/24/2022  Medication Sig  . acetaminophen (TYLENOL) 500 MG tablet Take 1,000 mg by mouth daily.  Marland Kitchen albuterol (VENTOLIN HFA) 108 (90 Base) MCG/ACT inhaler Inhale 1 - 2 puffs into the lungs every 6 hours as needed for wheezing or shortness of breath.  . calcium gluconate 500 MG tablet Take 1 tablet (500 mg total) by mouth daily.  . cholecalciferol (VITAMIN D) 1000 UNITS tablet Take 1,000 Units by mouth daily.  Marland Kitchen denosumab (PROLIA) 60 MG/ML SOSY injection Inject 60 mg into the skin every 6 (six) months.  . diphenhydramine-acetaminophen (TYLENOL PM) 25-500 MG TABS tablet Take 1  tablet by mouth at bedtime as needed (sleep).  . docusate sodium (COLACE) 100 MG capsule Take 100 mg by mouth 2 (two) times daily.  . famotidine (PEPCID) 20 MG tablet Take 20 mg by mouth daily.  Marland Kitchen FLUoxetine (PROZAC) 40 MG capsule TAKE 1 CAPSULE EVERY DAY  (REPLACES  TABLETS)  . furosemide (LASIX) 20 MG tablet TAKE 1/2 TABLET (10 MG TOTAL) BY MOUTH 2 (TWO) TIMES DAILY.  . hydrALAZINE (APRESOLINE) 25 MG tablet Take 1 tablet (25 mg total) by mouth in the morning and at bedtime.  Marland Kitchen levothyroxine (SYNTHROID) 88 MCG tablet TAKE 1 TABLET EVERY DAY BEFORE BREAKFAST  . lisinopril (ZESTRIL) 40 MG tablet TAKE 1 TABLET EVERY DAY  . loratadine (CLARITIN) 10 MG tablet TAKE 1 TABLET EVERY EVENING  . metoprolol succinate (TOPROL-XL) 25 MG 24 hr tablet TAKE 1 TABLET EVERY DAY  . MYRBETRIQ 25 MG TB24 tablet TAKE 1 TABLET EVERY DAY  . omeprazole (PRILOSEC) 40 MG capsule TAKE 1 CAPSULE EVERY DAY  . polyethylene glycol (MIRALAX / GLYCOLAX) 17 g packet Take 17 g by mouth daily as needed for mild constipation.  . pravastatin (PRAVACHOL) 40 MG tablet TAKE 1 TABLET EVERY DAY  . Propylene Glycol (SYSTANE BALANCE OP) Place 1 drop into both eyes daily  as needed (dry eyes).  . verapamil (CALAN-SR) 240 MG CR tablet TAKE 1 TABLET EVERY DAY (DOSE CHANGE)  . vitamin B-12 (CYANOCOBALAMIN) 500 MCG tablet Take 1 tablet (500 mcg total) by mouth once a week.  . [DISCONTINUED] hydrOXYzine (ATARAX) 10 MG tablet Take 0.5-1 tablets (5-10 mg total) by mouth 2 (two) times daily as needed for anxiety.  . hydrOXYzine (ATARAX) 10 MG tablet Take 0.5-1 tablets (5-10 mg total) by mouth 2 (two) times daily as needed for anxiety.   No facility-administered encounter medications on file as of 07/24/2022.

## 2022-07-24 NOTE — Telephone Encounter (Signed)
Patient was seen today and her daughter brought her Living Will into the office. Living Will was placed in your box. Thank you!

## 2022-07-29 DIAGNOSIS — G5602 Carpal tunnel syndrome, left upper limb: Secondary | ICD-10-CM | POA: Diagnosis not present

## 2022-07-29 DIAGNOSIS — M25641 Stiffness of right hand, not elsewhere classified: Secondary | ICD-10-CM | POA: Diagnosis not present

## 2022-07-29 DIAGNOSIS — M20021 Boutonniere deformity of right finger(s): Secondary | ICD-10-CM | POA: Diagnosis not present

## 2022-08-13 DIAGNOSIS — M25641 Stiffness of right hand, not elsewhere classified: Secondary | ICD-10-CM | POA: Diagnosis not present

## 2022-08-13 DIAGNOSIS — M20021 Boutonniere deformity of right finger(s): Secondary | ICD-10-CM | POA: Diagnosis not present

## 2022-08-13 DIAGNOSIS — G5602 Carpal tunnel syndrome, left upper limb: Secondary | ICD-10-CM | POA: Diagnosis not present

## 2022-08-19 DIAGNOSIS — M20021 Boutonniere deformity of right finger(s): Secondary | ICD-10-CM | POA: Diagnosis not present

## 2022-08-19 DIAGNOSIS — G5603 Carpal tunnel syndrome, bilateral upper limbs: Secondary | ICD-10-CM | POA: Diagnosis not present

## 2022-08-21 ENCOUNTER — Other Ambulatory Visit: Payer: Self-pay | Admitting: Family Medicine

## 2022-08-27 ENCOUNTER — Other Ambulatory Visit: Payer: Self-pay

## 2022-08-27 NOTE — Patient Outreach (Signed)
[facility]) Care Management  08/27/2022  ARACELLY TENCZA 11-28-1930 [id number]   Telephone Screen    Outreach call to patient to introduce [facility] services and assess care needs as part of benefit of PCP office and insurance plan.Spoke with patient. She voices that she is doing well for her age. She denies any RN CM needs or need for follow up.   Main healthcare issue/concern today: Patient reports she has a dislocated finger that is currently in splint. She sees ortho MD and finger is healing. Patient denies any issues with meds-uses mail order. Her daughter or granddaughter takes her to MD appts. She remains fairly independent with ADLs/IADLs.   Health Maintenance/Care Gaps Addressed & Education Provided:   -Last AWV: completed on 04/19/22-per EMR and KPN      [facility]etty Blend [place] Management Telephonic Care Management Coordinator Direct Phone: [phone] Toll Free: [phone] Fax: [phone]

## 2022-08-28 ENCOUNTER — Encounter: Payer: Self-pay | Admitting: Podiatry

## 2022-08-28 ENCOUNTER — Ambulatory Visit: Payer: Medicare HMO | Admitting: Podiatry

## 2022-08-28 DIAGNOSIS — M79674 Pain in right toe(s): Secondary | ICD-10-CM

## 2022-08-28 DIAGNOSIS — N1832 Chronic kidney disease, stage 3b: Secondary | ICD-10-CM

## 2022-08-28 DIAGNOSIS — B351 Tinea unguium: Secondary | ICD-10-CM | POA: Insufficient documentation

## 2022-08-28 DIAGNOSIS — M79675 Pain in left toe(s): Secondary | ICD-10-CM | POA: Diagnosis not present

## 2022-08-28 DIAGNOSIS — M25641 Stiffness of right hand, not elsewhere classified: Secondary | ICD-10-CM | POA: Diagnosis not present

## 2022-08-28 DIAGNOSIS — M20021 Boutonniere deformity of right finger(s): Secondary | ICD-10-CM | POA: Diagnosis not present

## 2022-08-28 NOTE — Progress Notes (Signed)
This patient returns to my office for at risk foot care.  This patient requires this care by a professional since this patient will be at risk due to having  CKD.  Patient presents to the office with her daughter.  This patient is unable to cut nails herself since the patient cannot reach hiernails.These nails are painful walking and wearing shoes.  This patient presents for at risk foot care today.  General Appearance  Alert, conversant and in no acute stress.  Vascular  Dorsalis pedis and posterior tibial  pulses are weakly  palpable  bilaterally.  Capillary return is within normal limits  bilaterally. Temperature is within normal limits  bilaterally.  Neurologic  Senn-Weinstein monofilament wire test within normal limits  bilaterally. Muscle power within normal limits bilaterally.  Nails Thick disfigured discolored nails with subungual debris  from hallux to fifth toes bilaterally. No evidence of bacterial infection or drainage bilaterally.  Orthopedic  No limitations of motion  feet .  No crepitus or effusions noted.  No bony pathology or digital deformities noted.  Skin  normotropic skin with no porokeratosis noted bilaterally.  No signs of infections or ulcers noted.     Onychomycosis  Pain in right toes  Pain in left toes  Consent was obtained for treatment procedures.   Mechanical debridement of nails 1-5  bilaterally performed with a nail nipper.  Filed with dremel without incident.    Return office visit    3 months                  Told patient to return for periodic foot care and evaluation due to potential at risk complications.   Joette Schmoker DPM   

## 2022-09-03 ENCOUNTER — Other Ambulatory Visit: Payer: Self-pay | Admitting: Family Medicine

## 2022-09-04 DIAGNOSIS — G5622 Lesion of ulnar nerve, left upper limb: Secondary | ICD-10-CM | POA: Diagnosis not present

## 2022-09-09 ENCOUNTER — Other Ambulatory Visit: Payer: Self-pay | Admitting: Family Medicine

## 2022-09-12 DIAGNOSIS — G5602 Carpal tunnel syndrome, left upper limb: Secondary | ICD-10-CM | POA: Diagnosis not present

## 2022-09-12 DIAGNOSIS — M25641 Stiffness of right hand, not elsewhere classified: Secondary | ICD-10-CM | POA: Diagnosis not present

## 2022-09-12 DIAGNOSIS — M20021 Boutonniere deformity of right finger(s): Secondary | ICD-10-CM | POA: Diagnosis not present

## 2022-09-13 ENCOUNTER — Telehealth: Payer: Self-pay

## 2022-09-13 NOTE — Telephone Encounter (Signed)
[facility] Night - Client Nonclinical Telephone Record  [facility] Client [facility] Night - Client Client Site [facility] Provider [name] - MD Contact Type Call Who Is Calling Patient / Member / Family / Caregiver Caller Name [name] Phone Number [phone] Patient Name [name] Patient DOB Aug 20, 1930 Call Type Message Only Information Provided Reason for Call Request to Reschedule Office Appointment Initial Comment Caller states calling for mother, wanted to reschedule appt. Patient request to speak to RN No Additional Comment Office hours provided. Disp. Time Disposition Final User 09/12/2022 5:04:46 PM General Information Provided Yes Karrie Doffing Call Closed By: Karrie Doffing Transaction Date/Time: 09/12/2022 5:00:42 PM (ET   Per appt notes appt has already been rescheduled this morning.

## 2022-09-16 ENCOUNTER — Ambulatory Visit: Payer: Medicare HMO | Admitting: Family Medicine

## 2022-09-18 ENCOUNTER — Ambulatory Visit (INDEPENDENT_AMBULATORY_CARE_PROVIDER_SITE_OTHER): Payer: Medicare HMO | Admitting: Family Medicine

## 2022-09-18 ENCOUNTER — Encounter: Payer: Self-pay | Admitting: Family Medicine

## 2022-09-18 VITALS — BP 134/72 | HR 74 | Temp 98.0°F | Ht 62.5 in | Wt 219.2 lb

## 2022-09-18 DIAGNOSIS — S41112A Laceration without foreign body of left upper arm, initial encounter: Secondary | ICD-10-CM | POA: Diagnosis not present

## 2022-09-18 DIAGNOSIS — G5602 Carpal tunnel syndrome, left upper limb: Secondary | ICD-10-CM | POA: Diagnosis not present

## 2022-09-18 DIAGNOSIS — Z23 Encounter for immunization: Secondary | ICD-10-CM

## 2022-09-18 DIAGNOSIS — K219 Gastro-esophageal reflux disease without esophagitis: Secondary | ICD-10-CM

## 2022-09-18 DIAGNOSIS — F331 Major depressive disorder, recurrent, moderate: Secondary | ICD-10-CM | POA: Diagnosis not present

## 2022-09-18 DIAGNOSIS — M810 Age-related osteoporosis without current pathological fracture: Secondary | ICD-10-CM

## 2022-09-18 DIAGNOSIS — I10 Essential (primary) hypertension: Secondary | ICD-10-CM

## 2022-09-18 NOTE — Assessment & Plan Note (Signed)
Chronic, stable period on current regimen. Would benefit from conseling but has had difficulty getting in. They will check with local pastor.  I have also placed a new referral to our [facility] counselor Santiago Glad at our office.

## 2022-09-18 NOTE — Assessment & Plan Note (Signed)
Skin tear dressed, home treatment reviewed.

## 2022-09-18 NOTE — Assessment & Plan Note (Signed)
Seeing hand surgeon for this.

## 2022-09-18 NOTE — Assessment & Plan Note (Signed)
Ongoing trouble despite regular PPI + pepcid. Will increase pepcid to bid, GERD handout provided. ok to use GasX prn.

## 2022-09-18 NOTE — Assessment & Plan Note (Signed)
Continues Q61moprolia injection - next due 11/14/2022.

## 2022-09-18 NOTE — Assessment & Plan Note (Signed)
Chronic, stable on current regimen - continue. 

## 2022-09-18 NOTE — Patient Instructions (Addendum)
Flu shot today I will try to have you see our new counselor Santiago Glad at our office.  Good to see you today.  Return as needed or in 8 months for physical/wellness visit.   Continue omeprazole '40mg'$  daily, pepcid '20mg'$  at night time, ok to try pepcid '20mg'$  twice daily if needed. Ok to use GasX.  Head of bed elevated. Avoidance of citrus, fatty foods, chocolate, peppermint, and excessive alcohol, along with sodas, orange juice (acidic drinks) At least a few hours between dinner and bed, minimize naps after eating.

## 2022-09-18 NOTE — Progress Notes (Signed)
Patient ID: [name], female    DOB: 01/14/1930, 88 y.o.   MRN: [id number]  This visit was conducted in person.  BP 134/72   Pulse 74   Temp 98 F (36.7 C) (Temporal)   Ht 5' 2.5" (1.588 m)   Wt 219 lb 4 oz (99.5 kg)   SpO2 96%   BMI 39.46 kg/m    CC: 4 mo f/u visit  Subjective:   HPI: [name] is a 88 y.o. female presenting on 09/18/2022 for Follow-up (Here for 4 mo f/u.  Pt accompanied by daughter, [name]. )   Seeing hand doctor for L carpal tunnel syndrome s/p NCS. Saw Dr Lorelei Pont after R 4th finger injury due to falling out of bed 06/2022. Using extension brace.   Awaiting new hearing aides.  She is staying active at her [facility] - going to exercises classes twice a week (30 min exercise per session).   MDD - continues prozac '40mg'$  daily with hydroxyzine '10mg'$  PRN.  Last visit referred to [facility] counselor - no luck getting in with counselor. They may try reaching out to local minister Josh Apple.   Known CKD with mild anemia. % sat low with low transferrin, iron stores normal.   OP - last prolia injection 05/14/2022.   Notes worsening GERD with certain foods - ie hamburgers. Notes increased belching.This is despite omeprazole '40mg'$  daily, pepcid '20mg'$  at night.      Relevant past medical, surgical, family and social history reviewed and updated as indicated. Interim medical history since our last visit reviewed. Allergies and medications reviewed and updated. Outpatient Medications Prior to Visit  Medication Sig Dispense Refill   acetaminophen (TYLENOL) 500 MG tablet Take 1,000 mg by mouth daily.     albuterol (VENTOLIN HFA) 108 (90 Base) MCG/ACT inhaler Inhale 1 - 2 puffs into the lungs every 6 hours as needed for wheezing or shortness of breath. 18 g 0   calcium gluconate 500 MG tablet Take 1 tablet (500 mg total) by mouth daily.     cholecalciferol (VITAMIN D) 1000 UNITS tablet Take 1,000 Units by mouth daily.     denosumab (PROLIA) 60 MG/ML SOSY injection Inject 60 mg  into the skin every 6 (six) months.     diphenhydramine-acetaminophen (TYLENOL PM) 25-500 MG TABS tablet Take 1 tablet by mouth at bedtime as needed (sleep).     docusate sodium (COLACE) 100 MG capsule Take 100 mg by mouth 2 (two) times daily.     famotidine (PEPCID) 20 MG tablet Take 20 mg by mouth daily.     FLUoxetine (PROZAC) 40 MG capsule TAKE 1 CAPSULE EVERY DAY  (REPLACES  TABLETS) 90 capsule 2   furosemide (LASIX) 20 MG tablet TAKE 1/2 TABLET (10 MG TOTAL) BY MOUTH 2 (TWO) TIMES DAILY. 90 tablet 0   hydrALAZINE (APRESOLINE) 25 MG tablet Take 1 tablet (25 mg total) by mouth in the morning and at bedtime. 180 tablet 3   hydrOXYzine (ATARAX) 10 MG tablet TAKE 1/2 TO 1 TABLET TWICE DAILY AS NEEDED FOR ANXIETY 90 tablet 0   levothyroxine (SYNTHROID) 88 MCG tablet TAKE 1 TABLET EVERY DAY BEFORE BREAKFAST 90 tablet 2   lisinopril (ZESTRIL) 40 MG tablet TAKE 1 TABLET EVERY DAY 90 tablet 1   loratadine (CLARITIN) 10 MG tablet TAKE 1 TABLET EVERY EVENING 90 tablet 1   metoprolol succinate (TOPROL-XL) 25 MG 24 hr tablet TAKE 1 TABLET EVERY DAY 90 tablet 3   MYRBETRIQ 25 MG  TB24 tablet TAKE 1 TABLET EVERY DAY 90 tablet 2   omeprazole (PRILOSEC) 40 MG capsule TAKE 1 CAPSULE EVERY DAY 90 capsule 2   polyethylene glycol (MIRALAX / GLYCOLAX) 17 g packet Take 17 g by mouth daily as needed for mild constipation. 14 each 0   pravastatin (PRAVACHOL) 40 MG tablet TAKE 1 TABLET EVERY DAY 90 tablet 1   Propylene Glycol (SYSTANE BALANCE OP) Place 1 drop into both eyes daily as needed (dry eyes).     verapamil (CALAN-SR) 240 MG CR tablet TAKE 1 TABLET EVERY DAY (DOSE CHANGE) 90 tablet 1   vitamin B-12 (CYANOCOBALAMIN) 500 MCG tablet Take 1 tablet (500 mcg total) by mouth once a week.     No facility-administered medications prior to visit.     Per HPI unless specifically indicated in ROS section below Review of Systems  Objective:  BP 134/72   Pulse 74   Temp 98 F (36.7 C) (Temporal)   Ht 5' 2.5"  (1.588 m)   Wt 219 lb 4 oz (99.5 kg)   SpO2 96%   BMI 39.46 kg/m   Wt Readings from Last 3 Encounters:  09/18/22 219 lb 4 oz (99.5 kg)  07/24/22 220 lb (99.8 kg)  05/14/22 213 lb 8 oz (96.8 kg)      Physical Exam Vitals and nursing note reviewed.  Constitutional:      Appearance: Normal appearance. She is obese. She is not ill-appearing.  Cardiovascular:     Rate and Rhythm: Normal rate and regular rhythm.     Pulses: Normal pulses.     Heart sounds: Normal heart sounds. No murmur heard. Pulmonary:     Effort: Pulmonary effort is normal. No respiratory distress.     Breath sounds: Normal breath sounds. No wheezing, rhonchi or rales.  Musculoskeletal:     Right lower leg: Edema (chronic mild) present.     Left lower leg: Edema (chronic mild) present.     Comments:  Compression stockings in place R 4th PIP with extension brace in place  Skin:    General: Skin is warm and dry.          Comments: L lateral elbow skin tear without erythema - cleaned with alcohol, dressed with gauze and tegaderm   Neurological:     Mental Status: She is alert.  Psychiatric:        Mood and Affect: Mood normal.        Behavior: Behavior normal.       Lab Results  Component Value Date   CREATININE 1.35 (H) 05/07/2022   BUN 17 05/07/2022   NA 143 05/07/2022   K 4.2 05/07/2022   CL 107 05/07/2022   CO2 28 05/07/2022    Assessment & Plan:   Problem List Items Addressed This Visit     Hypertension    Chronic, stable on current regimen - continue.       MDD (major depressive disorder), recurrent episode, moderate (HCC) - Primary    Chronic, stable period on current regimen. Would benefit from conseling but has had difficulty getting in. They will check with local pastor.  I have also placed a new referral to our [facility] counselor Santiago Glad at our office.       Relevant Orders   Ambulatory referral to Psychology   CTS (carpal tunnel syndrome)    Seeing hand surgeon for this.        Osteoporosis    Continues Q54moprolia injection - next due  11/14/2022.       Skin tear of left upper arm without complication    Skin tear dressed, home treatment reviewed.       GERD (gastroesophageal reflux disease)    Ongoing trouble despite regular PPI + pepcid. Will increase pepcid to bid, GERD handout provided. ok to use GasX prn.       Other Visit Diagnoses     Need for influenza vaccination       Relevant Orders   Flu Vaccine QUAD High Dose(Fluad) (Completed)        No orders of the defined types were placed in this encounter.  Orders Placed This Encounter  Procedures   Flu Vaccine QUAD High Dose(Fluad)   Ambulatory referral to Psychology    Referral Priority:   Routine    Referral Type:   Psychiatric    Referral Reason:   Specialty Services Required    Requested Specialty:   Psychology    Number of Visits Requested:   1     Patient Instructions  Flu shot today I will try to have you see our new counselor Santiago Glad at our office.  Good to see you today.  Return as needed or in 8 months for physical/wellness visit.   Continue omeprazole '40mg'$  daily, pepcid '20mg'$  at night time, ok to try pepcid '20mg'$  twice daily if needed. Ok to use GasX.  Head of bed elevated. Avoidance of citrus, fatty foods, chocolate, peppermint, and excessive alcohol, along with sodas, orange juice (acidic drinks) At least a few hours between dinner and bed, minimize naps after eating.   Follow up plan: Return in about 8 months (around 05/19/2023), or if symptoms worsen or fail to improve.  [name], MD

## 2022-10-08 ENCOUNTER — Ambulatory Visit (INDEPENDENT_AMBULATORY_CARE_PROVIDER_SITE_OTHER): Payer: Medicare HMO | Admitting: Clinical

## 2022-10-08 DIAGNOSIS — F4321 Adjustment disorder with depressed mood: Secondary | ICD-10-CM

## 2022-10-08 NOTE — Progress Notes (Signed)
                Taliana Mersereau, LCSW 

## 2022-10-08 NOTE — Progress Notes (Signed)
[facility] Counselor Initial Adult Exam  Name: [name] Date: 10/08/2022 MRN: [id number] DOB: [birth year] PCP: [name], MD  Time spent: 1:32pm-2:20pm  Guardian/Payee:  NA    Paperwork requested:  NA  Reason for Visit Tyna Jaksch Problem: Patient reported her daughter scheduled today's appointment. Patient reported she is from a large family and has always been around a lot of people. Patient reported she is in an apartment by herself and stated, "it gets lonesome by yourself". Daughter reported feeling patient holds back and doesn't express her feelings. Daughter reported patient is very apologetic and was upset that her son didn't attend her 90th birthday party. Daughter reported patient repeats herself at times. Daughter reported slight change in patient's memory and reported patient tells people what she thinks they want to hear.    Mental Status Exam: Appearance:   Neat     Behavior:  Appropriate  Motor:  Normal  Speech/Language:   Clear and Coherent  Affect:  Appropriate  Mood:  normal  Thought process:  normal  Thought content:    WNL  Sensory/Perceptual disturbances:    WNL  Orientation:  oriented to person, place, and time/date  Attention:  Good  Concentration:  Good  Memory:  WNL  Fund of knowledge:   Good  Insight:    Good  Judgment:   Good  Impulse Control:  Good   Reported Symptoms:  Patient stated, "pretty hectic" in response to clinician's inquiry about mood. Patient stated, "I just feel down in the dumps at times". Patient reported feeling lonely, stated "I don't remember things like I use to", difficulty hearing. Patient reported she hears her neighbor's radio at 12am at night and believes neighbor is playing a record that gets stuck. patient reported when she takes her hearing aids out she no longer hears music from neighbor's apartment. Patient reported symptoms started "after the new wore off from where I'm at" and reported moving to  her apartment 7-8 years ago. Daughter reported patient worries about her son.  Risk Assessment: Danger to Self:  No Patient denied current and past suicidal ideation. Patient denied current and past visual hallucinations.  Self-injurious Behavior: No Danger to Others: No Patient denied current and past homicidal ideation Duty to Warn:no Physical Aggression / Violence:No  Access to Firearms a concern: No  Gang Involvement:No  Patient / guardian was educated about steps to take if suicide or homicide risk level increases between visits: yes While future psychiatric events cannot be accurately predicted, the patient does not currently require acute inpatient psychiatric care and does not currently meet [facility] involuntary commitment criteria.  Substance Abuse History: Current substance abuse: No   Patient reported a history of tobacco use years ago. Patient reported no history of alcohol or drug use.  Past Psychiatric History:   No previous psychological problems have been observed Outpatient Providers: none History of Psych Hospitalization: No  Psychological Testing:  none    Abuse History:  Victim of: No.,  none    Report needed: No. Victim of Neglect:No. Perpetrator of  none   Witness / Exposure to Domestic Violence: No   Protective Services Involvement: No  Witness to [employer] Violence:  Yes   Family History:  Family History  Problem Relation Age of Onset   Cancer Mother 40       lung, nonsmoker   Cancer Father        lung, nonsmoker   Cancer Brother 75       lung,  smoker   Cancer Sister 62       brain   Cancer Sister 43       lung, smoker   COPD Sister        smoker   Cancer Sister 83       colon   Hypertension Daughter    Diabetes Daughter    CAD Neg Hx    Stroke Neg Hx     Living situation: the patient lives alone  Sexual Orientation: Straight  Relationship Status: widowed  Name of spouse / other:abennie If a parent, number of children / ages: 19  adult children (1 daughter died at 56 months old, one daughter died in March 26, 2016)  Support Systems: daughter   Museum/gallery curator Stress:  No   Income/Employment/Disability: Actor: No   Educational History: Education: high school diploma/GED  Religion/Sprituality/World View: baptist  Any cultural differences that may affect / interfere with treatment:  not applicable   Recreation/Hobbies: bingo, crocheting, making ornaments  Stressors: Other: none     Strengths: patient stated, "I pray"  Barriers:  Patient reported she hasn't been able to attend church as she did in the past and no longer drives   Legal History: Pending legal issue / charges: The patient has no significant history of legal issues. History of legal issue / charges:  none  Medical History/Surgical History: reviewed Past Medical History:  Diagnosis Date   Acute upper GI bleeding 03-26-20   Arthritis    Depression    History of anemia    History of chicken pox    History of ulcer disease 1990s   HLD (hyperlipidemia)    HTN (hypertension)    Hypothyroidism    Osteoporosis 03/2016   at [facility] - T -3.1 hip, -1.0 spine   Uses hearing aid     Past Surgical History:  Procedure Laterality Date   [place]   BIOPSY  03/13/2020   Procedure: BIOPSY;  Surgeon: Ladene Artist, MD;  Location: [facility] ENDOSCOPY;  Service: Endoscopy;;   CARDIOVASCULAR STRESS TEST  03/26/09   WNL   CARPAL TUNNEL RELEASE Left 04/28/2017   CATARACT EXTRACTION Bilateral 03/27/2011   COLONOSCOPY WITH PROPOFOL N/A 03/14/2020   single bleeding colonic angiectasia treated with APC Hilarie Fredrickson, Lajuan Lines, MD)   DEXA  03/2016   at [facility] - T -3.1 hip, -1.0 spine   ESOPHAGOGASTRODUODENOSCOPY (EGD) WITH PROPOFOL N/A 03/13/2020   erosive gastropathy with stigmata of recent bleeding Ladene Artist, MD)   [place] N/A 03/14/2020   Procedure: HOT HEMOSTASIS (ARGON PLASMA  COAGULATION/BICAP);  Surgeon: Jerene Bears, MD;  Location: [facility] ENDOSCOPY;  Service: Gastroenterology;  Laterality: N/A;   KNEE SURGERY  1987   right   KNEE SURGERY  1998   left   KNEE SURGERY  03-27-2007   POLYPECTOMY  03/14/2020   Procedure: POLYPECTOMY;  Surgeon: Jerene Bears, MD;  Location: [facility] ENDOSCOPY;  Service: Gastroenterology;;   TONSILLECTOMY  1945   US ECHOCARDIOGRAPHY  March 26, 2009   LVEF 65-75%, overall normal, mildly decreased LV diastolic compliance    Medications: Current Outpatient Medications  Medication Sig Dispense Refill   acetaminophen (TYLENOL) 500 MG tablet Take 1,000 mg by mouth daily.     albuterol (VENTOLIN HFA) 108 (90 Base) MCG/ACT inhaler Inhale 1 - 2 puffs into the lungs every 6 hours as needed for wheezing or shortness of breath. 18 g 0  calcium gluconate 500 MG tablet Take 1 tablet (500 mg total) by mouth daily.     cholecalciferol (VITAMIN D) 1000 UNITS tablet Take 1,000 Units by mouth daily.     denosumab (PROLIA) 60 MG/ML SOSY injection Inject 60 mg into the skin every 6 (six) months.     diphenhydramine-acetaminophen (TYLENOL PM) 25-500 MG TABS tablet Take 1 tablet by mouth at bedtime as needed (sleep).     docusate sodium (COLACE) 100 MG capsule Take 100 mg by mouth 2 (two) times daily.     famotidine (PEPCID) 20 MG tablet Take 20 mg by mouth daily.     FLUoxetine (PROZAC) 40 MG capsule TAKE 1 CAPSULE EVERY DAY  (REPLACES  TABLETS) 90 capsule 2   furosemide (LASIX) 20 MG tablet TAKE 1/2 TABLET (10 MG TOTAL) BY MOUTH 2 (TWO) TIMES DAILY. 90 tablet 0   hydrALAZINE (APRESOLINE) 25 MG tablet Take 1 tablet (25 mg total) by mouth in the morning and at bedtime. 180 tablet 3   hydrOXYzine (ATARAX) 10 MG tablet TAKE 1/2 TO 1 TABLET TWICE DAILY AS NEEDED FOR ANXIETY 90 tablet 0   levothyroxine (SYNTHROID) 88 MCG tablet TAKE 1 TABLET EVERY DAY BEFORE BREAKFAST 90 tablet 2   lisinopril (ZESTRIL) 40 MG tablet TAKE 1 TABLET EVERY DAY 90 tablet 1   loratadine (CLARITIN) 10 MG  tablet TAKE 1 TABLET EVERY EVENING 90 tablet 1   metoprolol succinate (TOPROL-XL) 25 MG 24 hr tablet TAKE 1 TABLET EVERY DAY 90 tablet 3   MYRBETRIQ 25 MG TB24 tablet TAKE 1 TABLET EVERY DAY 90 tablet 2   omeprazole (PRILOSEC) 40 MG capsule TAKE 1 CAPSULE EVERY DAY 90 capsule 2   polyethylene glycol (MIRALAX / GLYCOLAX) 17 g packet Take 17 g by mouth daily as needed for mild constipation. 14 each 0   pravastatin (PRAVACHOL) 40 MG tablet TAKE 1 TABLET EVERY DAY 90 tablet 1   Propylene Glycol (SYSTANE BALANCE OP) Place 1 drop into both eyes daily as needed (dry eyes).     verapamil (CALAN-SR) 240 MG CR tablet TAKE 1 TABLET EVERY DAY (DOSE CHANGE) 90 tablet 1   vitamin B-12 (CYANOCOBALAMIN) 500 MCG tablet Take 1 tablet (500 mcg total) by mouth once a week.     No current facility-administered medications for this visit.    No Known Allergies  Diagnoses:  Adjustment disorder with depressed mood  R/O Major Depressive Disorder  Plan of Care: Clinician conducted initial assessment with patient present and obtained verbal consent for patient's daughter, [name], to provide additional information at the end of session. Patient is a 88 year old female who presented for an initial assessment. Patient reported her daughter scheduled today's appointment.  Patient reported the following symptoms: "pretty hectic" in response to clinician's inquiry about mood, stated, "I just feel down in the dumps at times", feeling lonely, stated "I don't remember things like I use to", difficulty hearing. Patient reported she hears her neighbor's radio at 12am at night and believes neighbor is playing a record that gets stuck. patient reported when she takes her hearing aids out she no longer hears music from her neighbor's apartment. Patient reported symptoms started "after the new wore off from where I'm at" and reported moving to her apartment 7-8 years ago. Patient's daughter reported patient worries about her son and  reported feeling patient doesn't express her feelings. Patient's daughter reported slight change in patient's memory and reported patient repeats herself at times. Patient denied current and past suicidal  ideation, homicidal ideation, and visual hallucinations. Patient reported no current or past alcohol or drug use. Patient reported no history of outpatient or inpatient psychiatric treatment. Patient reported she currently lives alone and stated, "it gets lonesome by yourself". Patient reported she no longer drives and is not able to attend church as she did in the past. Patient identified her daughter as a support. It is recommended patient participate in individual therapy. Clinician will review recommendations and treatment plan with patient during follow up appointment.    Katherina Right, LCSW

## 2022-10-10 DIAGNOSIS — M20021 Boutonniere deformity of right finger(s): Secondary | ICD-10-CM | POA: Diagnosis not present

## 2022-10-10 DIAGNOSIS — G5602 Carpal tunnel syndrome, left upper limb: Secondary | ICD-10-CM | POA: Diagnosis not present

## 2022-10-30 ENCOUNTER — Ambulatory Visit: Payer: Medicare HMO | Admitting: Clinical

## 2022-11-02 ENCOUNTER — Other Ambulatory Visit: Payer: Self-pay | Admitting: Family Medicine

## 2022-11-02 DIAGNOSIS — K219 Gastro-esophageal reflux disease without esophagitis: Secondary | ICD-10-CM

## 2022-11-02 DIAGNOSIS — N3941 Urge incontinence: Secondary | ICD-10-CM

## 2022-11-02 DIAGNOSIS — E039 Hypothyroidism, unspecified: Secondary | ICD-10-CM

## 2022-11-02 DIAGNOSIS — F331 Major depressive disorder, recurrent, moderate: Secondary | ICD-10-CM

## 2022-11-07 ENCOUNTER — Telehealth: Payer: Self-pay

## 2022-11-07 DIAGNOSIS — M810 Age-related osteoporosis without current pathological fracture: Secondary | ICD-10-CM

## 2022-11-07 NOTE — Telephone Encounter (Signed)
Next injection due 11/15/22 or after. OOP cost $315 PA done 12/30/21-12/29/22 Patient's daughter, [name], advised. Lab on 11/13 and NV 11/21

## 2022-11-11 ENCOUNTER — Other Ambulatory Visit (INDEPENDENT_AMBULATORY_CARE_PROVIDER_SITE_OTHER): Payer: Medicare HMO

## 2022-11-11 DIAGNOSIS — M810 Age-related osteoporosis without current pathological fracture: Secondary | ICD-10-CM

## 2022-11-11 LAB — BASIC METABOLIC PANEL
BUN: 20 mg/dL (ref 6–23)
CO2: 29 mEq/L (ref 19–32)
Calcium: 9.8 mg/dL (ref 8.4–10.5)
Chloride: 105 mEq/L (ref 96–112)
Creatinine, Ser: 1.4 mg/dL — ABNORMAL HIGH (ref 0.40–1.20)
GFR: 33.14 mL/min — ABNORMAL LOW (ref >60.00)
Glucose, Bld: 77 mg/dL (ref 70–99)
Potassium: 4.2 mEq/L (ref 3.5–5.1)
Sodium: 141 mEq/L (ref 135–145)

## 2022-11-18 NOTE — Telephone Encounter (Signed)
Labs done CrCl is 41.95 mL/min. Calcium normal at 9.8

## 2022-11-19 ENCOUNTER — Ambulatory Visit (INDEPENDENT_AMBULATORY_CARE_PROVIDER_SITE_OTHER): Payer: Medicare HMO | Admitting: *Deleted

## 2022-11-19 DIAGNOSIS — M810 Age-related osteoporosis without current pathological fracture: Secondary | ICD-10-CM | POA: Diagnosis not present

## 2022-11-19 MED ORDER — DENOSUMAB 60 MG/ML ~~LOC~~ SOSY
60.0000 mg | PREFILLED_SYRINGE | Freq: Once | SUBCUTANEOUS | Status: AC
  Administered 2022-11-19: 60 mg via SUBCUTANEOUS

## 2022-11-19 NOTE — Progress Notes (Signed)
Per orders of Dr. Gutierrez, injection of Prolia given by Yarelie Hams. Patient tolerated injection well.  

## 2022-11-22 ENCOUNTER — Other Ambulatory Visit: Payer: Self-pay | Admitting: Family Medicine

## 2022-11-25 ENCOUNTER — Other Ambulatory Visit: Payer: Self-pay | Admitting: Family Medicine

## 2022-11-25 NOTE — Telephone Encounter (Signed)
Last refilled on 09/04/22

## 2022-11-26 NOTE — Telephone Encounter (Signed)
ERx 

## 2022-11-27 ENCOUNTER — Ambulatory Visit: Payer: Medicare HMO | Admitting: Clinical

## 2022-11-27 ENCOUNTER — Ambulatory Visit: Payer: Medicare HMO | Admitting: Podiatry

## 2022-11-27 ENCOUNTER — Encounter: Payer: Self-pay | Admitting: Podiatry

## 2022-11-27 DIAGNOSIS — M79674 Pain in right toe(s): Secondary | ICD-10-CM | POA: Diagnosis not present

## 2022-11-27 DIAGNOSIS — B351 Tinea unguium: Secondary | ICD-10-CM

## 2022-11-27 DIAGNOSIS — M79675 Pain in left toe(s): Secondary | ICD-10-CM

## 2022-11-27 DIAGNOSIS — N1832 Chronic kidney disease, stage 3b: Secondary | ICD-10-CM

## 2022-11-27 NOTE — Progress Notes (Signed)
This patient returns to my office for at risk foot care.  This patient requires this care by a professional since this patient will be at risk due to having  CKD.  Patient presents to the office with her daughter.  This patient is unable to cut nails herself since the patient cannot reach hiernails.These nails are painful walking and wearing shoes.  This patient presents for at risk foot care today.  General Appearance  Alert, conversant and in no acute stress.  Vascular  Dorsalis pedis and posterior tibial  pulses are weakly  palpable  bilaterally.  Capillary return is within normal limits  bilaterally. Temperature is within normal limits  bilaterally.  Neurologic  Senn-Weinstein monofilament wire test within normal limits  bilaterally. Muscle power within normal limits bilaterally.  Nails Thick disfigured discolored nails with subungual debris  from hallux to fifth toes bilaterally. No evidence of bacterial infection or drainage bilaterally.  Orthopedic  No limitations of motion  feet .  No crepitus or effusions noted.  No bony pathology or digital deformities noted.  Skin  normotropic skin with no porokeratosis noted bilaterally.  No signs of infections or ulcers noted.     Onychomycosis  Pain in right toes  Pain in left toes  Consent was obtained for treatment procedures.   Mechanical debridement of nails 1-5  bilaterally performed with a nail nipper.  Filed with dremel without incident.    Return office visit    3 months                  Told patient to return for periodic foot care and evaluation due to potential at risk complications.   Nikitia Asbill DPM   

## 2022-12-04 ENCOUNTER — Telehealth: Payer: Self-pay

## 2022-12-04 NOTE — Telephone Encounter (Signed)
Last Prolia inj: 11/19/22 Next Prolia inj DUE: 05/19/23

## 2023-01-01 ENCOUNTER — Ambulatory Visit: Payer: Medicare HMO | Admitting: Clinical

## 2023-01-01 DIAGNOSIS — F4321 Adjustment disorder with depressed mood: Secondary | ICD-10-CM

## 2023-01-01 NOTE — Progress Notes (Signed)
[facility] Counselor/Therapist Progress Note  Patient ID: [name], MRN: [id number]    Date: 01/01/23  Time Spent: 12:32  pm - 1:18 pm : 46 Minutes  Treatment Type: Individual Therapy.  Reported Symptoms: Patient reported "ups and downs" since last session.   Mental Status Exam: Appearance:  Well Groomed     Behavior: Appropriate  Motor: Normal  Speech/Language:  Clear and Coherent  Affect: Appropriate  Mood: normal  Thought process: circumstantial  Thought content:   WNL  Sensory/Perceptual disturbances:   WNL  Orientation: oriented to person, place, day of week, and year  Attention: Good  Concentration: Good  Memory: Patient reported difficulty recalling day of week at times  Fund of knowledge:  Good  Insight:   Fair  Judgment:  Good  Impulse Control: Good   Risk Assessment: Danger to Self:  No Patient denied current suicidal ideation Self-injurious Behavior: No Danger to Others: No Patient denied current homicidal ideation Duty to Warn:no Physical Aggression / Violence:No  Access to Firearms a concern: No  Gang Involvement:No   Subjective:  Patient stated, "fair", "my ups and downs" when clinician inquired about mood since last session. Patient stated, "I let my mind run away with me sometimes", "I worry about everything". Patient stated, "I think of the bad side, the worst" at times. Patient reported sadness, feeling "things are closing in", decreased energy, fatigue, worries that she could have done something different to change situations, decreased concentration, and reported feeling she is a burden at times. Patient reported she prays and reads to cope with symptoms. Patient reported her husband has been deceased for 24 years. Patient reported she worries about her children. Patient reported no "ups and downs" prior to moving to long term care community. Patient reported she feels safe in her current living environment. Patient reported a recent  fall and stated she "feels tense about falling again".  Patient stated, "I don't want to be a burden on them" in regards to her children. Patient stated, "it gets awfully lonesome over there with nobody to talk to". Patient reported she participates in regular exercise and bingo at her community. Patient stated, "it might would help" in response to the recommendation for therapy. Patient reported she attends a weekly church service in her community. Patient stated, "I feel pretty good today".  Interventions: Motivational Interviewing. Clinician clarified current and previous symptoms. Clinician reviewed diagnosis and treatment recommendations. Provided psycho education related to diagnosis and treatment. Clinician utilized motivational interviewing to explore potential goals for therapy. Patient experienced difficulty identifying goals for therapy.   Diagnosis:  Adjustment disorder with depressed mood   Plan: Goals to be determined during follow up appointment. Patient's daughter will call to schedule follow up appointment.                       Katherina Right, LCSW

## 2023-01-21 ENCOUNTER — Telehealth: Payer: Self-pay

## 2023-01-21 ENCOUNTER — Ambulatory Visit (INDEPENDENT_AMBULATORY_CARE_PROVIDER_SITE_OTHER): Payer: Medicare HMO | Admitting: Family Medicine

## 2023-01-21 ENCOUNTER — Encounter: Payer: Self-pay | Admitting: Family Medicine

## 2023-01-21 VITALS — BP 134/62 | HR 55 | Temp 98.0°F | Ht 62.5 in | Wt 216.2 lb

## 2023-01-21 DIAGNOSIS — E039 Hypothyroidism, unspecified: Secondary | ICD-10-CM

## 2023-01-21 DIAGNOSIS — I5032 Chronic diastolic (congestive) heart failure: Secondary | ICD-10-CM

## 2023-01-21 DIAGNOSIS — R4182 Altered mental status, unspecified: Secondary | ICD-10-CM | POA: Diagnosis not present

## 2023-01-21 DIAGNOSIS — N1832 Chronic kidney disease, stage 3b: Secondary | ICD-10-CM

## 2023-01-21 DIAGNOSIS — R0609 Other forms of dyspnea: Secondary | ICD-10-CM | POA: Diagnosis not present

## 2023-01-21 LAB — POCT URINALYSIS DIPSTICK
Bilirubin, UA: NEGATIVE
Blood, UA: NEGATIVE
Glucose, UA: NEGATIVE
Ketones, UA: NEGATIVE
Nitrite, UA: NEGATIVE
Protein, UA: NEGATIVE
Spec Grav, UA: 1.01 (ref 1.010–1.025)
Urobilinogen, UA: 0.2 E.U./dL
pH, UA: 6 (ref 5.0–8.0)

## 2023-01-21 NOTE — Telephone Encounter (Signed)
[name] (DPR signed) said that about 20 - 30 mins ago pt called sue and said that the 2 grand kids were playing behind pts television. [name] said one of the children is at school and the youngest grandchild was with [name] at the time of the call. Pt was sure that the children were playing behind television. [name] said she kept reassuring pt that they were not with pt. Pt then said she must be hallucinating. [name] said last week she was reviewing pts med box and the pt had been rambling in the med box that sue fixes for pt and that some of the meds were missing from the pre prepared med box. No available appts and [name] is going to take pt to [facility] ED now for eval. Sending note to Dr Danise Mina and Danise Mina pool.I will also teams Lisa cma.

## 2023-01-21 NOTE — Progress Notes (Unsigned)
Patient ID: [name], female    DOB: 03/17/1930, 89 y.o.   MRN: [id number]  This visit was conducted in person.  BP 134/62   Pulse (!) 55   Temp 98 F (36.7 C) (Temporal)   Ht 5' 2.5" (1.588 m)   Wt 216 lb 4 oz (98.1 kg)   SpO2 99%   BMI 38.92 kg/m    CC: confusion, hallucinations Subjective:   HPI: [name] is a 89 y.o. female presenting on 01/21/2023 for Altered Mental Status (C/o confusion and hallucinations. Sxs started today. Also, pt c/o ongoing SOB OE. Pt accompanied by daughter, [name]. Per [name], not sure if pt is taking meds regularly. )   1d h/o increased confusion and visual hallucinations - saw grandchildren playing in her house although they weren't there. Also notes ongoing exertional dyspnea. Pt's daughter manages pillbox but pt takes them daily, daughter concerned she may not be taking appropriately as on recent review by daughter several meds were missing from pill box.   No other vision changes, headache, chest pain.  No UTI symptoms Notes cough for several weeks as well as ongoing exertional dyspnea. Occ nausea, diarrhea a few days ago.   Didn't sleep well last night.  Notes increased gassiness after meals.  Appetite good. Some weight loss noted.  She lives at [address] in [place].      Relevant past medical, surgical, family and social history reviewed and updated as indicated. Interim medical history since our last visit reviewed. Allergies and medications reviewed and updated. Outpatient Medications Prior to Visit  Medication Sig Dispense Refill   acetaminophen (TYLENOL) 500 MG tablet Take 1,000 mg by mouth daily.     albuterol (VENTOLIN HFA) 108 (90 Base) MCG/ACT inhaler Inhale 1 - 2 puffs into the lungs every 6 hours as needed for wheezing or shortness of breath. 18 g 0   calcium gluconate 500 MG tablet Take 1 tablet (500 mg total) by mouth daily.     cholecalciferol (VITAMIN D) 1000 UNITS tablet Take 1,000 Units by mouth daily.      denosumab (PROLIA) 60 MG/ML SOSY injection Inject 60 mg into the skin every 6 (six) months.     diphenhydramine-acetaminophen (TYLENOL PM) 25-500 MG TABS tablet Take 1 tablet by mouth at bedtime as needed (sleep).     docusate sodium (COLACE) 100 MG capsule Take 100 mg by mouth 2 (two) times daily.     famotidine (PEPCID) 20 MG tablet Take 20 mg by mouth daily.     FLUoxetine (PROZAC) 40 MG capsule TAKE 1 CAPSULE EVERY DAY 90 capsule 2   furosemide (LASIX) 20 MG tablet TAKE 1/2 TABLET (10 MG TOTAL) BY MOUTH 2 (TWO) TIMES DAILY. 90 tablet 1   hydrALAZINE (APRESOLINE) 25 MG tablet Take 1 tablet (25 mg total) by mouth in the morning and at bedtime. 180 tablet 3   hydrOXYzine (ATARAX) 10 MG tablet TAKE 1/2 TO 1 TABLET TWICE DAILY AS NEEDED FOR ANXIETY 90 tablet 1   levothyroxine (SYNTHROID) 88 MCG tablet TAKE 1 TABLET EVERY DAY BEFORE BREAKFAST 90 tablet 2   lisinopril (ZESTRIL) 40 MG tablet TAKE 1 TABLET EVERY DAY 90 tablet 1   loratadine (CLARITIN) 10 MG tablet TAKE 1 TABLET EVERY EVENING 90 tablet 1   metoprolol succinate (TOPROL-XL) 25 MG 24 hr tablet TAKE 1 TABLET EVERY DAY 90 tablet 3   mirabegron ER (MYRBETRIQ) 25 MG TB24 tablet TAKE 1 TABLET EVERY DAY 90 tablet 2  omeprazole (PRILOSEC) 40 MG capsule TAKE 1 CAPSULE EVERY DAY 90 capsule 2   polyethylene glycol (MIRALAX / GLYCOLAX) 17 g packet Take 17 g by mouth daily as needed for mild constipation. 14 each 0   pravastatin (PRAVACHOL) 40 MG tablet TAKE 1 TABLET EVERY DAY 90 tablet 1   Propylene Glycol (SYSTANE BALANCE OP) Place 1 drop into both eyes daily as needed (dry eyes).     verapamil (CALAN-SR) 240 MG CR tablet TAKE 1 TABLET EVERY DAY (DOSE CHANGE) 90 tablet 1   vitamin B-12 (CYANOCOBALAMIN) 500 MCG tablet Take 1 tablet (500 mcg total) by mouth once a week.     No facility-administered medications prior to visit.     Per HPI unless specifically indicated in ROS section below Review of Systems  Objective:  BP 134/62   Pulse (!)  55   Temp 98 F (36.7 C) (Temporal)   Ht 5' 2.5" (1.588 m)   Wt 216 lb 4 oz (98.1 kg)   SpO2 99%   BMI 38.92 kg/m   Wt Readings from Last 3 Encounters:  01/21/23 216 lb 4 oz (98.1 kg)  09/18/22 219 lb 4 oz (99.5 kg)  07/24/22 220 lb (99.8 kg)      Physical Exam Vitals and nursing note reviewed.  Constitutional:      Appearance: Normal appearance. She is obese. She is not ill-appearing.     Comments: Ambulates with walker  HENT:     Head: Normocephalic and atraumatic.     Mouth/Throat:     Mouth: Mucous membranes are moist.     Pharynx: Oropharynx is clear. No oropharyngeal exudate or posterior oropharyngeal erythema.  Eyes:     Extraocular Movements: Extraocular movements intact.     Pupils: Pupils are equal, round, and reactive to light.  Neck:     Thyroid: No thyroid mass or thyromegaly.  Cardiovascular:     Rate and Rhythm: Normal rate and regular rhythm.     Pulses: Normal pulses.     Heart sounds: Normal heart sounds. No murmur heard. Pulmonary:     Effort: Pulmonary effort is normal. No respiratory distress.     Breath sounds: Normal breath sounds. No wheezing, rhonchi or rales.  Musculoskeletal:     Right lower leg: Edema (tr) present.     Left lower leg: Edema (tr) present.     Comments: Compression stockings in place  Skin:    General: Skin is warm and dry.  Neurological:     Mental Status: She is alert.  Psychiatric:        Mood and Affect: Mood normal.        Behavior: Behavior normal.       Results for orders placed or performed in visit on 01/21/23  POCT urinalysis dipstick  Result Value Ref Range   Color, UA yellow    Clarity, UA cloudy    Glucose, UA Negative Negative   Bilirubin, UA negative    Ketones, UA negative    Spec Grav, UA 1.010 1.010 - 1.025   Blood, UA negative    pH, UA 6.0 5.0 - 8.0   Protein, UA Negative Negative   Urobilinogen, UA 0.2 0.2 or 1.0 E.U./dL   Nitrite, UA negative    Leukocytes, UA Trace (A) Negative    Appearance     Odor      Assessment & Plan:   Problem List Items Addressed This Visit     Hypothyroidism   Relevant Orders  TSH   CKD (chronic kidney disease) stage 3, GFR 30-59 ml/min (HCC)   Chronic diastolic CHF (congestive heart failure) (HCC)   Relevant Orders   Brain natriuretic peptide   AMS (altered mental status) - Primary   Relevant Orders   POCT urinalysis dipstick (Completed)   CBC with Differential/Platelet   Comprehensive metabolic panel   TSH     No orders of the defined types were placed in this encounter.   Orders Placed This Encounter  Procedures   CBC with Differential/Platelet   Comprehensive metabolic panel   TSH   Brain natriuretic peptide   POCT urinalysis dipstick    Patient Instructions  Urine looking ok Labwork today  Try melatonin 1-'5mg'$  at night as needed for sleep.  Continue omeprazole '40mg'$  daily with pepcid '20mg'$  nightly for stomach. May try Gas X as needed for gassiness  Follow up plan: Return if symptoms worsen or fail to improve.  [name], MD

## 2023-01-21 NOTE — Telephone Encounter (Signed)
Spoke to patient's daughter [name] and was advised that she can have her here to be seen this afternoon at 2:15. [name] was advised to make sure that her mom can give [place] a urine sample when she gets here.  [name] stated that she had already talked to her mom and she said that she was not going to the ER. [name] stated that her mom did not sleep last night.

## 2023-01-21 NOTE — Telephone Encounter (Signed)
Can pt come in today at 2:15pm for appt? Would want to check UA at that time.

## 2023-01-21 NOTE — Patient Instructions (Addendum)
Urine looking ok Labwork today  Try melatonin 1-'5mg'$  at night as needed for sleep.  Continue omeprazole '40mg'$  daily with pepcid '20mg'$  nightly for stomach. May try Gas X as needed for gassiness

## 2023-01-22 LAB — COMPREHENSIVE METABOLIC PANEL
ALT: 8 U/L (ref 0–35)
AST: 13 U/L (ref 0–37)
Albumin: 3.9 g/dL (ref 3.5–5.2)
Alkaline Phosphatase: 41 U/L (ref 39–117)
BUN: 20 mg/dL (ref 6–23)
CO2: 29 mEq/L (ref 19–32)
Calcium: 9 mg/dL (ref 8.4–10.5)
Chloride: 105 mEq/L (ref 96–112)
Creatinine, Ser: 1.42 mg/dL — ABNORMAL HIGH (ref 0.40–1.20)
GFR: 32.53 mL/min — ABNORMAL LOW (ref >60.00)
Glucose, Bld: 89 mg/dL (ref 70–99)
Potassium: 4.4 mEq/L (ref 3.5–5.1)
Sodium: 142 mEq/L (ref 135–145)
Total Bilirubin: 0.4 mg/dL (ref 0.2–1.2)
Total Protein: 6.5 g/dL (ref 6.0–8.3)

## 2023-01-22 LAB — CBC WITH DIFFERENTIAL/PLATELET
Basophils Absolute: 0 10*3/uL (ref 0.0–0.1)
Basophils Relative: 0.7 % (ref 0.0–3.0)
Eosinophils Absolute: 0.2 10*3/uL (ref 0.0–0.7)
Eosinophils Relative: 3.1 % (ref 0.0–5.0)
HCT: 34 % — ABNORMAL LOW (ref 36.0–46.0)
Hemoglobin: 11.1 g/dL — ABNORMAL LOW (ref 12.0–15.0)
Lymphocytes Relative: 19.4 % (ref 12.0–46.0)
Lymphs Abs: 1.3 10*3/uL (ref 0.7–4.0)
MCHC: 32.6 g/dL (ref 30.0–36.0)
MCV: 87.7 fl (ref 78.0–100.0)
Monocytes Absolute: 0.7 10*3/uL (ref 0.1–1.0)
Monocytes Relative: 11.1 % (ref 3.0–12.0)
Neutro Abs: 4.3 10*3/uL (ref 1.4–7.7)
Neutrophils Relative %: 65.7 % (ref 43.0–77.0)
Platelets: 262 10*3/uL (ref 150.0–400.0)
RBC: 3.88 Mil/uL (ref 3.87–5.11)
RDW: 14.9 % (ref 11.5–15.5)
WBC: 6.5 10*3/uL (ref 4.0–10.5)

## 2023-01-22 LAB — TSH: TSH: 1.84 u[IU]/mL (ref 0.35–5.50)

## 2023-01-22 LAB — BRAIN NATRIURETIC PEPTIDE: Pro B Natriuretic peptide (BNP): 286 pg/mL — ABNORMAL HIGH (ref 0.0–100.0)

## 2023-01-23 NOTE — Assessment & Plan Note (Signed)
Renal function remains stable with GFR low 30s.  Encourage staying well hydrated

## 2023-01-23 NOTE — Assessment & Plan Note (Signed)
Notes ongoing exertional dyspnea, overall stable. Update BNP in h/o HFpEF.  She also continues furosemide '10mg'$  BID.  Seems euvolemic today.

## 2023-01-23 NOTE — Assessment & Plan Note (Signed)
Continues levothyroxine 1mg daily - update TSH

## 2023-01-23 NOTE — Assessment & Plan Note (Signed)
Seems euvolemic on lasix '10mg'$  BID.

## 2023-01-23 NOTE — Assessment & Plan Note (Signed)
1 day of increased confusion associated with visual hallucinations in setting of poor sleep the night before.  Overall reassuring exam, pt without UTI symptoms and UA without signs of infection.  Will further evaluate with labwork r/o organic/metabolic cause.  Discussed importance of sleep - may try melatonin to help sleep. Rec minimize tylenol PM.

## 2023-01-29 ENCOUNTER — Ambulatory Visit: Payer: Medicare HMO | Admitting: Clinical

## 2023-01-29 DIAGNOSIS — F4321 Adjustment disorder with depressed mood: Secondary | ICD-10-CM | POA: Diagnosis not present

## 2023-01-29 NOTE — Progress Notes (Signed)
  [facility] Counselor/Therapist Progress Note  Patient ID: [name], MRN: [id number]    Date: 01/29/23  Time Spent: 12:30  pm - 1:27 pm : 57 Minutes  Treatment Type: Individual Therapy.  Reported Symptoms: Patient reported recent auditory and visual hallucinations  Mental Status Exam: Appearance:  Well Groomed     Behavior: Drowsy  Motor: Normal  Speech/Language:  Clear and Coherent  Affect: Appropriate  Mood: normal  Thought process: normal  Thought content:   WNL  Sensory/Perceptual disturbances:   WNL at time of session  Orientation: oriented to person and situation  Attention: Patient fell asleep several times during session  Concentration: Patient fell asleep several times during session  Memory: [place] of knowledge:  Good  Insight:   Fair  Judgment:  Good  Impulse Control: Good   Risk Assessment: Danger to Self:  No Patient denied current suicidal ideation Self-injurious Behavior: No Danger to Others: No Patient denied current homicidal ideation Duty to Warn:no Physical Aggression / Violence:No  Access to Firearms a concern: No  Gang Involvement:No   Subjective:  Patient reported feeling "so so" since last session. Patient stated, "I've been seeing things". Patient reported visual and auditory hallucinations last week of her grandchildren in her apartment talking to patient.  Patient stated, "its like they were there" and stated, "it came to me that they wasn't there". In addition, patient reported visual hallucination of seeing her grandchildren behind the television and reported they were talking to patient. Patient reported she has also experienced visual and auditory hallucinations of her son in her apartment talking to patient. Patient reported frequent hallucinations. Patient stated, "I get depressed a lot seem like". Patient stated, "It just comes and goes away" in response to depressed mood. Patient's daughter reported patient had a  recent appointment with her primary care provider the first day patient vocalized hallucinations to her daughter. Daughter reported patient was prescribed an inhaler for previous diagnosis of COVID and her primary care provider advised hallucinations could be in response to the inhaler. Daughter reported she manages patient's medications and puts medications in a pill organizer for patient. Daughter reported when checking patient's pill organizer there were some medications missing and moved around. Patient reported recent increase in drowsiness and reported difficulty falling asleep some nights.    Interventions:  Assessed symptoms. Assisted patient and daughter with problem solving as it relates to recent symptoms. Clinician recommended patient follow up with her primary care provider for a medical evaluation to address recent hallucinations. Provided psycho education related to hallucinations and the importance of medical evaluation to address symptoms. Provided psycho education related to importance of taking medications as prescribed and medication resources, such as, medication review with pharmacist.  Clinician conducted session in person from clinician's office at [facility]. Patient provided verbal consent for her daughter, [name], to be present and participate in session.   Diagnosis:  Adjustment disorder with depressed mood   Plan: Goals to be determined during follow up appointment on 02/05/23 due to clinical needs during today's session and patient falling asleep during session.                      Katherina Right, LCSW

## 2023-02-05 ENCOUNTER — Ambulatory Visit: Payer: Medicare HMO | Admitting: Clinical

## 2023-02-05 ENCOUNTER — Telehealth: Payer: Self-pay | Admitting: Family Medicine

## 2023-02-05 DIAGNOSIS — F4321 Adjustment disorder with depressed mood: Secondary | ICD-10-CM

## 2023-02-05 NOTE — Progress Notes (Signed)
                Fred Hammes, LCSW 

## 2023-02-05 NOTE — Telephone Encounter (Signed)
Daughter returned a phone call,is requesting a cb [phone]

## 2023-02-05 NOTE — Progress Notes (Signed)
[facility] Counselor/Therapist Progress Note  Patient ID: [name], MRN: [id number],    Date: 02/05/2023  Time Spent: 1:39pm -  2:18pm : 39 minutes  Treatment Type: Individual Therapy  Reported Symptoms: Patient reported depressed mood and feeling drowsy during session  Mental Status Exam: Appearance:  Well Groomed     Behavior: Drowsy  Motor: Patient was falling asleep during session  Speech/Language:  Clear and Coherent  Affect: Appropriate  Mood: depressed  Thought process: normal  Thought content:   WNL  Sensory/Perceptual disturbances:   WNL  Orientation: oriented to person and situation  Attention: Poor  Concentration: Poor  Memory: WNL  Fund of knowledge:  Good  Insight:   Fair  Judgment:  Good  Impulse Control: Good   Risk Assessment: Danger to Self:  No Patient denied current suicidal ideation Self-injurious Behavior: No Danger to Others: No Patient denied current homicidal ideation Duty to Warn:no Physical Aggression / Violence:No  Access to Firearms a concern: No  Gang Involvement:No   Subjective: Patient reported no hallucinations since last session and daughter confirmed patient has not reported any hallucinations since last session. Patient reported she has been taking medication as directed and distributed in pill Environmental education officer. Patient stated, "my eyes are bothering me". Patient reported she forgets to put drops in her eyes at times. Patient reported depressed mood today.  Patient stated, "I don't know" in response to goals for therapy and stated, "its hard to say".  Patient reported she was able to talk to her daughter who is now deceased and reported she doesn't feel she has anyone to talk to any longer. Patient reported conflict between her son and daughter upset patient. Patient reported she slept well last night but reported feeling drowsy today. Patient stated, "I feel like I could just lay down and go to sleep".   Interventions:  Motivational Interviewing. Clinician conducted session in person from clinician's office at [facility]. Patient provided verbal consent for her daughter, [name], to be present and participate at the beginning of session. Assessed symptoms. Clinician utilized motivational interviewing to explore potential goals for therapy. Clinician utilized a task centered approach in collaboration with patient to develop goals for therapy.     Diagnosis:  Adjustment disorder with depressed mood     Plan: Patient is to utilize [employer] [facility], thought re-framing, mindfulness and coping strategies to decrease symptoms associated with Adjustment Disorder with depressed mood. Frequency: bi-weekly  Modality: individual     Long-term goal:   Reduce overall level, frequency, and intensity of the feelings of depression and anxiety as evidenced by decrease in depressed mood, feeling lonely, and worry from 6 to 7 days/week to 0 to 1 days/week per patient report for at least 3 consecutive months.  Target Date: 02/06/24  Progress: progressing   Short-term goal:  Develop and implement strategies to increase socialization and support  Target Date: 08/06/23  Progress: progressing   Process grief associated with the death of patient's daughter  Target Date: 08/06/23  Progress: progressing                          Katherina Right, LCSW

## 2023-02-10 ENCOUNTER — Ambulatory Visit (INDEPENDENT_AMBULATORY_CARE_PROVIDER_SITE_OTHER): Payer: Medicare HMO | Admitting: Family Medicine

## 2023-02-10 ENCOUNTER — Encounter: Payer: Self-pay | Admitting: Family Medicine

## 2023-02-10 VITALS — BP 160/66 | HR 74 | Temp 97.7°F | Ht 62.5 in | Wt 222.2 lb

## 2023-02-10 DIAGNOSIS — I5032 Chronic diastolic (congestive) heart failure: Secondary | ICD-10-CM

## 2023-02-10 DIAGNOSIS — Z6841 Body Mass Index (BMI) 40.0 and over, adult: Secondary | ICD-10-CM | POA: Diagnosis not present

## 2023-02-10 DIAGNOSIS — R404 Transient alteration of awareness: Secondary | ICD-10-CM

## 2023-02-10 DIAGNOSIS — F331 Major depressive disorder, recurrent, moderate: Secondary | ICD-10-CM | POA: Diagnosis not present

## 2023-02-10 DIAGNOSIS — I10 Essential (primary) hypertension: Secondary | ICD-10-CM | POA: Diagnosis not present

## 2023-02-10 MED ORDER — TRAZODONE HCL 50 MG PO TABS: 25.0000 mg | ORAL_TABLET | Freq: Every evening | ORAL | 3 refills | Status: DC | PRN

## 2023-02-10 MED ORDER — FUROSEMIDE 20 MG PO TABS: 20.0000 mg | ORAL_TABLET | Freq: Every day | ORAL | 1 refills | Status: DC

## 2023-02-10 NOTE — Patient Instructions (Addendum)
Continue prozac 34m daily in the morning.  Try trazodone 25-577mat night time for sleep - take at bedtime, let usKoreanow how you do with this.  May keep hydroxyzine as needed for anxiety or sleep.  Keep May appointment for physical  Blood pressure regimen: Hydralazine 2557mwice daily Lasix 44m53mce daily Lisinopril 40mg73me daily Toprol XL 25mg 26m daily Verapamil 240mg o2mdaily

## 2023-02-10 NOTE — Assessment & Plan Note (Signed)
Chronic, continues struggling with loneliness since moving to [place] in [place]. Continues seeing counselor Santiago Glad in our office. Also continues prozac 80m daily.  Poor sleep cycle going to bed early am and getting up late morning sometimes with only a few hours of sleep. No significant benefit since starting hydroxyzine - will change to trazodone 25-529mnightly PRN, rec take at 9-10pm - update with effect.

## 2023-02-10 NOTE — Progress Notes (Signed)
Patient ID: [name], female    DOB: 03/31/1930, 89 y.o.   MRN: [id number]  This visit was conducted in person.  BP (!) 160/66 (BP Location: Right Arm, Cuff Size: Large)   Pulse 74   Temp 97.7 F (36.5 C) (Temporal)   Ht 5' 2.5" (1.588 m)   Wt 222 lb 4 oz (100.8 kg)   SpO2 97%   BMI 40.00 kg/m   BP Readings from Last 3 Encounters:  02/10/23 (!) 160/66  01/21/23 134/62  09/18/22 134/72    CC: follow up visit  Subjective:   HPI: [name] is a 89 y.o. female presenting on 02/10/2023 for Medical Management of Chronic Issues (Here for mood f/u. Pt accompanied by daughter, [name]. )   See prior note for details.  Increased confusion with visual hallucinations, workup unrevealing for metabolic derangement cause. ?poor sleep vs mood related - started on melatonin and she continues seeing Santiago Glad our counselor. She is on prozac 63m daily. She's also been taking hydroxyzine 184mat night for sleep. They didn't try melatonin yet. Saw sister last night - she passed away last year.   She notes increased gassiness recently.   HTN - BP elevated today. This is despite regularly taking lasix 1052mID, hydralazine 71m11mD, lisinopril 40mg54mly, Toprol XL 71mg 75my, and verapamil 240mg d39m. Daughter prepares pill box once weekly. Overall good compliance with meds. No HA, vision changes, CP/tightness, significant SOB. Sometimes trouble getting both doses of lasix 10mg BI13motes 10mg dos60mt as effective with UOP.      Relevant past medical, surgical, family and social history reviewed and updated as indicated. Interim medical history since our last visit reviewed. Allergies and medications reviewed and updated. Outpatient Medications Prior to Visit  Medication Sig Dispense Refill   acetaminophen (TYLENOL) 500 MG tablet Take 1,000 mg by mouth daily.     albuterol (VENTOLIN HFA) 108 (90 Base) MCG/ACT inhaler Inhale 1 - 2 puffs into the lungs every 6 hours as needed for wheezing or  shortness of breath. 18 g 0   calcium gluconate 500 MG tablet Take 1 tablet (500 mg total) by mouth daily.     cholecalciferol (VITAMIN D) 1000 UNITS tablet Take 1,000 Units by mouth daily.     denosumab (PROLIA) 60 MG/ML SOSY injection Inject 60 mg into the skin every 6 (six) months.     diphenhydramine-acetaminophen (TYLENOL PM) 25-500 MG TABS tablet Take 1 tablet by mouth at bedtime as needed (sleep).     docusate sodium (COLACE) 100 MG capsule Take 100 mg by mouth 2 (two) times daily.     famotidine (PEPCID) 20 MG tablet Take 20 mg by mouth daily.     FLUoxetine (PROZAC) 40 MG capsule TAKE 1 CAPSULE EVERY DAY 90 capsule 2   hydrALAZINE (APRESOLINE) 25 MG tablet Take 1 tablet (25 mg total) by mouth in the morning and at bedtime. 180 tablet 3   hydrOXYzine (ATARAX) 10 MG tablet TAKE 1/2 TO 1 TABLET TWICE DAILY AS NEEDED FOR ANXIETY 90 tablet 1   levothyroxine (SYNTHROID) 88 MCG tablet TAKE 1 TABLET EVERY DAY BEFORE BREAKFAST 90 tablet 2   lisinopril (ZESTRIL) 40 MG tablet TAKE 1 TABLET EVERY DAY 90 tablet 1   loratadine (CLARITIN) 10 MG tablet TAKE 1 TABLET EVERY EVENING 90 tablet 1   metoprolol succinate (TOPROL-XL) 25 MG 24 hr tablet TAKE 1 TABLET EVERY DAY 90 tablet 3   mirabegron ER (MYRBETRIQ) 25 MG TB24  tablet TAKE 1 TABLET EVERY DAY 90 tablet 2   omeprazole (PRILOSEC) 40 MG capsule TAKE 1 CAPSULE EVERY DAY 90 capsule 2   polyethylene glycol (MIRALAX / GLYCOLAX) 17 g packet Take 17 g by mouth daily as needed for mild constipation. 14 each 0   pravastatin (PRAVACHOL) 40 MG tablet TAKE 1 TABLET EVERY DAY 90 tablet 1   Propylene Glycol (SYSTANE BALANCE OP) Place 1 drop into both eyes daily as needed (dry eyes).     verapamil (CALAN-SR) 240 MG CR tablet TAKE 1 TABLET EVERY DAY (DOSE CHANGE) 90 tablet 1   vitamin B-12 (CYANOCOBALAMIN) 500 MCG tablet Take 1 tablet (500 mcg total) by mouth once a week.     furosemide (LASIX) 20 MG tablet TAKE 1/2 TABLET (10 MG TOTAL) BY MOUTH 2 (TWO) TIMES  DAILY. 90 tablet 1   No facility-administered medications prior to visit.     Per HPI unless specifically indicated in ROS section below Review of Systems  Objective:  BP (!) 160/66 (BP Location: Right Arm, Cuff Size: Large)   Pulse 74   Temp 97.7 F (36.5 C) (Temporal)   Ht 5' 2.5" (1.588 m)   Wt 222 lb 4 oz (100.8 kg)   SpO2 97%   BMI 40.00 kg/m   Wt Readings from Last 3 Encounters:  02/10/23 222 lb 4 oz (100.8 kg)  01/21/23 216 lb 4 oz (98.1 kg)  09/18/22 219 lb 4 oz (99.5 kg)      Physical Exam Vitals and nursing note reviewed.  Constitutional:      Appearance: Normal appearance. She is not ill-appearing.  Eyes:     Extraocular Movements: Extraocular movements intact.     Conjunctiva/sclera: Conjunctivae normal.     Pupils: Pupils are equal, round, and reactive to light.  Cardiovascular:     Rate and Rhythm: Normal rate and regular rhythm.     Pulses: Normal pulses.     Heart sounds: Normal heart sounds. No murmur heard. Pulmonary:     Effort: Pulmonary effort is normal. No respiratory distress.     Breath sounds: Normal breath sounds. No wheezing or rhonchi.  Musculoskeletal:     Cervical back: Normal range of motion and neck supple. No rigidity.     Right lower leg: Edema (1+) present.     Left lower leg: Edema (1+) present.     Comments: Wearing compression stockings  Lymphadenopathy:     Cervical: No cervical adenopathy.  Skin:    General: Skin is warm and dry.     Findings: No rash.  Neurological:     Mental Status: She is alert.  Psychiatric:        Mood and Affect: Mood normal.        Behavior: Behavior normal.       Results for orders placed or performed in visit on 01/21/23  CBC with Differential/Platelet  Result Value Ref Range   WBC 6.5 4.0 - 10.5 K/uL   RBC 3.88 3.87 - 5.11 Mil/uL   Hemoglobin 11.1 (L) 12.0 - 15.0 g/dL   HCT 34.0 (L) 36.0 - 46.0 %   MCV 87.7 78.0 - 100.0 fl   MCHC 32.6 30.0 - 36.0 g/dL   RDW 14.9 11.5 - 15.5 %    Platelets 262.0 150.0 - 400.0 K/uL   Neutrophils Relative % 65.7 43.0 - 77.0 %   Lymphocytes Relative 19.4 12.0 - 46.0 %   Monocytes Relative 11.1 3.0 - 12.0 %   Eosinophils Relative 3.1  0.0 - 5.0 %   Basophils Relative 0.7 0.0 - 3.0 %   Neutro Abs 4.3 1.4 - 7.7 K/uL   Lymphs Abs 1.3 0.7 - 4.0 K/uL   Monocytes Absolute 0.7 0.1 - 1.0 K/uL   Eosinophils Absolute 0.2 0.0 - 0.7 K/uL   Basophils Absolute 0.0 0.0 - 0.1 K/uL  Comprehensive metabolic panel  Result Value Ref Range   Sodium 142 135 - 145 mEq/L   Potassium 4.4 3.5 - 5.1 mEq/L   Chloride 105 96 - 112 mEq/L   CO2 29 19 - 32 mEq/L   Glucose, Bld 89 70 - 99 mg/dL   BUN 20 6 - 23 mg/dL   Creatinine, Ser 1.42 (H) 0.40 - 1.20 mg/dL   Total Bilirubin 0.4 0.2 - 1.2 mg/dL   Alkaline Phosphatase 41 39 - 117 U/L   AST 13 0 - 37 U/L   ALT 8 0 - 35 U/L   Total Protein 6.5 6.0 - 8.3 g/dL   Albumin 3.9 3.5 - 5.2 g/dL   GFR 32.53 (L) >60.00 mL/min   Calcium 9.0 8.4 - 10.5 mg/dL  TSH  Result Value Ref Range   TSH 1.84 0.35 - 5.50 uIU/mL  Brain natriuretic peptide  Result Value Ref Range   Pro B Natriuretic peptide (BNP) 286.0 (H) 0.0 - 100.0 pg/mL  POCT urinalysis dipstick  Result Value Ref Range   Color, UA yellow    Clarity, UA cloudy    Glucose, UA Negative Negative   Bilirubin, UA negative    Ketones, UA negative    Spec Grav, UA 1.010 1.010 - 1.025   Blood, UA negative    pH, UA 6.0 5.0 - 8.0   Protein, UA Negative Negative   Urobilinogen, UA 0.2 0.2 or 1.0 E.U./dL   Nitrite, UA negative    Leukocytes, UA Trace (A) Negative   Appearance     Odor        02/10/2023   11:55 AM 01/21/2023    2:18 PM 04/19/2022   10:58 AM 10/11/2020    3:07 PM 03/03/2020    3:44 PM  Depression screen PHQ 2/9  Decreased Interest 2 1 0 3 1  Down, Depressed, Hopeless 1 1 0 2 2  PHQ - 2 Score 3 2 0 5 3  Altered sleeping 1 0  2 3  Tired, decreased energy 0 2  1 2  $ Change in appetite 0 2  3 0  Feeling bad or failure about yourself  0 1  1 0   Trouble concentrating 0 1  3 1  $ Moving slowly or fidgety/restless 0 1  2 1  $ Suicidal thoughts 0 0  0 0  PHQ-9 Score 4 9  17 10  $ Difficult doing work/chores Not difficult at all           02/10/2023   11:56 AM 01/21/2023    2:18 PM 10/11/2020    3:08 PM 03/03/2020    3:45 PM  GAD 7 : Generalized Anxiety Score  Nervous, Anxious, on Edge 1 0 2 2  Control/stop worrying 3 0 3 3  Worry too much - different things 3 0 3 2  Trouble relaxing 2 0 1 1  Restless 1 0 3 1  Easily annoyed or irritable 2 0 3 1  Afraid - awful might happen 1 0 0 1  Total GAD 7 Score 13 0 15 11   Assessment & Plan:   Problem List Items Addressed This Visit  Hypertension - Primary    BP markedly elevated today although pt reports continued compliance with meds. Daughter continues managing medications. They note sometimes having difficulty with cutting lasix 80m in half to take 122mbid - will change to lasix 2072maily in am. I also asked daughter to review BP regimen at home ensure taking as prescribed, let me know if BP remaining high.       Relevant Medications   furosemide (LASIX) 20 MG tablet   MDD (major depressive disorder), recurrent episode, moderate (HCC)    Chronic, [place] with [place] office. Also continues prozac 17m85mily.  Poor sleep cycle going to bed early am and getting up late morning sometimes with only a few hours of sleep. No significant benefit since starting hydroxyzine - will change to trazodone 25-50mg4mhtly PRN, rec take at 9-10pm - update with effect.       Relevant Medications   traZODone (DESYREL) 50 MG tablet   Chronic diastolic CHF (congestive heart failure) (HCC)    Weight gain noted, as well as poor effect of 10mg 55m. Will change lasix to 20mg d53m.       Relevant Medications   furosemide (LASIX) 20 MG tablet   AMS (altered mental status)    This seems largely  resolved.         Meds ordered this encounter  Medications   traZODone (DESYREL) 50 MG tablet    Sig: Take 0.5-1 tablets (25-50 mg total) by mouth at bedtime as needed for sleep.    Dispense:  30 tablet    Refill:  3   furosemide (LASIX) 20 MG tablet    Sig: Take 1 tablet (20 mg total) by mouth daily.    Dispense:  90 tablet    Refill:  1    Use this sig    No orders of the defined types were placed in this encounter.   Patient Instructions  Continue prozac 17mg da41min the morning.  Try trazodone 25-50mg at 109mt time for sleep - take at bedtime, let us know hKorea you do with this.  May keep hydroxyzine as needed for anxiety or sleep.  Keep May appointment for physical  Blood pressure regimen: Hydralazine 25mg twic12mily Lasix 20mg once 15my Lisinopril 17mg once d46m Toprol XL 25mg once da58mVerapamil 217mg once dai103mFollow up plan: Return if symptoms worsen or fail to improve.  Tasnia Spegal GutierrRia Bush

## 2023-02-10 NOTE — Assessment & Plan Note (Signed)
This seems largely resolved.

## 2023-02-10 NOTE — Assessment & Plan Note (Addendum)
Weight gain noted, as well as poor effect of 71m dose. Will change lasix to 219mdaily.

## 2023-02-10 NOTE — Assessment & Plan Note (Signed)
BP markedly elevated today although pt reports continued compliance with meds. Daughter continues managing medications. They note sometimes having difficulty with cutting lasix 42m in half to take 160mbid - will change to lasix 2012maily in am. I also asked daughter to review BP regimen at home ensure taking as prescribed, let me know if BP remaining high.

## 2023-02-28 ENCOUNTER — Other Ambulatory Visit: Payer: Self-pay | Admitting: Family Medicine

## 2023-03-03 ENCOUNTER — Other Ambulatory Visit: Payer: Self-pay | Admitting: Family Medicine

## 2023-03-03 ENCOUNTER — Ambulatory Visit: Payer: Medicare HMO | Admitting: Podiatry

## 2023-03-03 ENCOUNTER — Encounter: Payer: Self-pay | Admitting: Podiatry

## 2023-03-03 DIAGNOSIS — N1832 Chronic kidney disease, stage 3b: Secondary | ICD-10-CM

## 2023-03-03 DIAGNOSIS — M79674 Pain in right toe(s): Secondary | ICD-10-CM | POA: Diagnosis not present

## 2023-03-03 DIAGNOSIS — B351 Tinea unguium: Secondary | ICD-10-CM | POA: Diagnosis not present

## 2023-03-03 DIAGNOSIS — M79675 Pain in left toe(s): Secondary | ICD-10-CM

## 2023-03-03 DIAGNOSIS — J309 Allergic rhinitis, unspecified: Secondary | ICD-10-CM

## 2023-03-03 NOTE — Progress Notes (Signed)
This patient returns to my office for at risk foot care.  This patient requires this care by a professional since this patient will be at risk due to having  CKD.  Patient presents to the office with her daughter.  This patient is unable to cut nails herself since the patient cannot reach hiernails.These nails are painful walking and wearing shoes.  This patient presents for at risk foot care today.  General Appearance  Alert, conversant and in no acute stress.  Vascular  Dorsalis pedis and posterior tibial  pulses are weakly  palpable  bilaterally.  Capillary return is within normal limits  bilaterally. Temperature is within normal limits  bilaterally.  Neurologic  Senn-Weinstein monofilament wire test within normal limits  bilaterally. Muscle power within normal limits bilaterally.  Nails Thick disfigured discolored nails with subungual debris  from hallux to fifth toes bilaterally. No evidence of bacterial infection or drainage bilaterally.  Orthopedic  No limitations of motion  feet .  No crepitus or effusions noted.  No bony pathology or digital deformities noted.  Skin  normotropic skin with no porokeratosis noted bilaterally.  No signs of infections or ulcers noted.     Onychomycosis  Pain in right toes  Pain in left toes  Consent was obtained for treatment procedures.   Mechanical debridement of nails 1-5  bilaterally performed with a nail nipper.  Filed with dremel without incident.    Return office visit    3 months                  Told patient to return for periodic foot care and evaluation due to potential at risk complications.   Gardiner Barefoot DPM

## 2023-03-05 ENCOUNTER — Ambulatory Visit: Payer: Medicare HMO | Admitting: Clinical

## 2023-03-05 ENCOUNTER — Ambulatory Visit: Payer: Medicare HMO | Admitting: Podiatry

## 2023-03-11 ENCOUNTER — Ambulatory Visit: Payer: Medicare HMO | Admitting: Clinical

## 2023-03-11 DIAGNOSIS — F4321 Adjustment disorder with depressed mood: Secondary | ICD-10-CM | POA: Diagnosis not present

## 2023-03-11 NOTE — Progress Notes (Signed)
                Amere Iott, LCSW 

## 2023-03-11 NOTE — Progress Notes (Signed)
[facility] Counselor/Therapist Progress Note  Patient ID: [name], MRN: [id number],    Date: 03/11/2023  Time Spent: 12:31pm - 1:31pm : 60 minutes   Treatment Type: Individual Therapy  Reported Symptoms: Patient stated, "I feel terrible this morning" and reported feeling drowsy.   Mental Status Exam: Appearance:  Well Groomed     Behavior: Drowsy  Motor: drowsy  Speech/Language:  Clear and Coherent  Affect: Appropriate  Mood: depressed  Thought process: normal  Thought content:   WNL  Sensory/Perceptual disturbances:   WNL  Orientation: oriented to person and situation  Attention: Drowsy and patient experienced difficulty keeping her eyes open during session  Concentration: Poor  Memory: WNL  Fund of knowledge:  Good  Insight:   Fair  Judgment:  Good  Impulse Control: Good   Risk Assessment: Danger to Self:  No Patient denied current suicidal ideation  Self-injurious Behavior: No Danger to Others: No Patient denied current homicidal ideation  Duty to Warn:no Physical Aggression / Violence:No  Access to Firearms a concern: No  Gang Involvement:No   Subjective: Patient stated, "I feel terrible this morning". Patient stated,  "I'm sleepy all the time" and stated, "I just can't get enough sleep". Patient reported she has been waking up at 3am and experiencing difficulty returning to sleep for the past 2-3 months. Patient reported feeling "blah" daily. Patient reported feeling drowsy during session and experienced difficulty keeping her eyes open during session. Patient reported she has not informed her primary care physician of the increased drowsiness that patient reported occurs daily. Patient reported she has to sit up in her bed to sleep and reported feeling "smothered" when laying down. Patient reported she watches television until she gets sleepy. Patient stated, "I'll try it" in response to referral to a psychiatrist and reported she is open to  following up with her primary care provider. Patient reported she feels she has let her children down. Patient reported lack of energy. Patient reported she has been seeing people in her room at night since last session and reported belief that someone took money from her dresser.   Interventions: Cognitive Behavioral Therapy. Clinician conducted session in person at clinician's office at [facility]. Reviewed events since last session. Assessed symptoms. Explored patient's sleep hygiene. Provided psycho education related to depressive symptoms, sleep, and hallucinations. Discussed patient following up with primary care provider to discuss drowsiness, increased fatigue, and hallucinations. Clinician recommended a referral to a psychiatrist for a medication management consultation and patient agreed to referral. Patient provided verbal consent for her daughter, [name], to be present at the end of the session to discuss referral to psychiatrist, follow up with primary care provider, and schedule patient's follow up appointment with clinician.      Diagnosis:  Adjustment disorder with depressed mood     Plan: Patient is to utilize [facility], thought re-framing, mindfulness and coping strategies to decrease symptoms associated with Adjustment Disorder with depressed mood. Frequency: bi-weekly  Modality: individual      Long-term goal:   Reduce overall level, frequency, and intensity of the feelings of depression and anxiety as evidenced by decrease in depressed mood, feeling lonely, and worry from 6 to 7 days/week to 0 to 1 days/week per patient report for at least 3 consecutive months.   Target Date: 02/06/24  Progress: progressing    Short-term goal:  Develop and implement strategies to increase socialization and support   Target Date: 08/06/23  Progress: progressing  Process grief associated with the death of patient's daughter   Target Date: 08/06/23   Progress: progressing                             Katherina Right, LCSW

## 2023-03-17 ENCOUNTER — Encounter: Payer: Self-pay | Admitting: Family Medicine

## 2023-03-17 ENCOUNTER — Other Ambulatory Visit: Payer: Self-pay | Admitting: Family Medicine

## 2023-03-17 ENCOUNTER — Ambulatory Visit (INDEPENDENT_AMBULATORY_CARE_PROVIDER_SITE_OTHER): Payer: Medicare HMO | Admitting: Family Medicine

## 2023-03-17 VITALS — BP 138/66 | HR 61 | Temp 97.8°F | Ht 62.5 in | Wt 216.5 lb

## 2023-03-17 DIAGNOSIS — I10 Essential (primary) hypertension: Secondary | ICD-10-CM | POA: Diagnosis not present

## 2023-03-17 DIAGNOSIS — Z79899 Other long term (current) drug therapy: Secondary | ICD-10-CM | POA: Diagnosis not present

## 2023-03-17 DIAGNOSIS — F331 Major depressive disorder, recurrent, moderate: Secondary | ICD-10-CM

## 2023-03-17 DIAGNOSIS — R404 Transient alteration of awareness: Secondary | ICD-10-CM | POA: Diagnosis not present

## 2023-03-17 MED ORDER — TRAZODONE HCL 50 MG PO TABS: 25.0000 mg | ORAL_TABLET | Freq: Every evening | ORAL | 3 refills | Status: DC | PRN

## 2023-03-17 NOTE — Patient Instructions (Addendum)
Continue prozac 40mg  daily Continue trazodone but only 1/2 tablet at night time Stop hydroxyzine.  Ok to take evening and bedtime medicines at the same time.  Keep May physical.

## 2023-03-17 NOTE — Assessment & Plan Note (Signed)
Chronic, controlled on current regimen - continue meds.

## 2023-03-17 NOTE — Assessment & Plan Note (Signed)
She's been taking meds 4 times a day - thyroid and tylenol in am followed by morning meds then PM meds and evening meds.  In order to simplify regimen ,I recommended they combine PM and QHS meds together.

## 2023-03-17 NOTE — Progress Notes (Signed)
Patient ID: [name], female    DOB: 1930/09/18, 89 y.o.   MRN: [id number]  This visit was conducted in person.  BP 138/66   Pulse 61   Temp 97.8 F (36.6 C) (Temporal)   Ht 5' 2.5" (1.588 m)   Wt 216 lb 8 oz (98.2 kg)   SpO2 97%   BMI 38.97 kg/m    CC: wants to discuss meds  Subjective:   HPI: [name] is a 89 y.o. female presenting on 03/17/2023 for Discuss Medication (Wants to discuss medications and to make sure of no interactions with each. Pt accompanied by daughter, [name]. )   See prior note for details. Seen here 01/2023 with increased confusion noted with visual hallucinations with reassuring evaluation negative for metabolic derangement as cause.   She continues taking prozac 40mg  daily along with trazodone 50mg  nightly. She is seeing Chiropractor.   She is somnolent today because she had a poor night of sleep last night.  Daughter notes she's been doing better over the past 2 weeks.   Not having as many visual hallucinations.      Relevant past medical, surgical, family and social history reviewed and updated as indicated. Interim medical history since our last visit reviewed. Allergies and medications reviewed and updated. Outpatient Medications Prior to Visit  Medication Sig Dispense Refill   acetaminophen (TYLENOL) 500 MG tablet Take 1,000 mg by mouth daily.     albuterol (VENTOLIN HFA) 108 (90 Base) MCG/ACT inhaler Inhale 1 - 2 puffs into the lungs every 6 hours as needed for wheezing or shortness of breath. 18 g 0   calcium gluconate 500 MG tablet Take 1 tablet (500 mg total) by mouth daily.     cholecalciferol (VITAMIN D) 1000 UNITS tablet Take 1,000 Units by mouth daily.     denosumab (PROLIA) 60 MG/ML SOSY injection Inject 60 mg into the skin every 6 (six) months.     diphenhydramine-acetaminophen (TYLENOL PM) 25-500 MG TABS tablet Take 1 tablet by mouth at bedtime as needed (sleep).     docusate sodium (COLACE) 100 MG capsule Take 100 mg by  mouth 2 (two) times daily.     famotidine (PEPCID) 20 MG tablet Take 20 mg by mouth daily.     FLUoxetine (PROZAC) 40 MG capsule TAKE 1 CAPSULE EVERY DAY 90 capsule 2   furosemide (LASIX) 20 MG tablet Take 1 tablet (20 mg total) by mouth daily. 90 tablet 1   hydrALAZINE (APRESOLINE) 25 MG tablet Take 1 tablet (25 mg total) by mouth in the morning and at bedtime. 180 tablet 3   levothyroxine (SYNTHROID) 88 MCG tablet TAKE 1 TABLET EVERY DAY BEFORE BREAKFAST 90 tablet 2   lisinopril (ZESTRIL) 40 MG tablet TAKE 1 TABLET EVERY DAY 90 tablet 1   loratadine (CLARITIN) 10 MG tablet TAKE 1 TABLET EVERY EVENING 90 tablet 0   metoprolol succinate (TOPROL-XL) 25 MG 24 hr tablet TAKE 1 TABLET EVERY DAY 90 tablet 3   mirabegron ER (MYRBETRIQ) 25 MG TB24 tablet TAKE 1 TABLET EVERY DAY 90 tablet 2   omeprazole (PRILOSEC) 40 MG capsule TAKE 1 CAPSULE EVERY DAY 90 capsule 2   polyethylene glycol (MIRALAX / GLYCOLAX) 17 g packet Take 17 g by mouth daily as needed for mild constipation. 14 each 0   pravastatin (PRAVACHOL) 40 MG tablet TAKE 1 TABLET EVERY DAY 90 tablet 1   Propylene Glycol (SYSTANE BALANCE OP) Place 1 drop into both eyes daily as  needed (dry eyes).     verapamil (CALAN-SR) 240 MG CR tablet TAKE 1 TABLET EVERY DAY (DOSE CHANGE) 90 tablet 1   vitamin B-12 (CYANOCOBALAMIN) 500 MCG tablet Take 1 tablet (500 mcg total) by mouth once a week.     hydrOXYzine (ATARAX) 10 MG tablet TAKE 1/2 TO 1 TABLET TWICE DAILY AS NEEDED FOR ANXIETY 90 tablet 0   traZODone (DESYREL) 50 MG tablet Take 0.5-1 tablets (25-50 mg total) by mouth at bedtime as needed for sleep. 30 tablet 3   No facility-administered medications prior to visit.     Per HPI unless specifically indicated in ROS section below Review of Systems  Objective:  BP 138/66   Pulse 61   Temp 97.8 F (36.6 C) (Temporal)   Ht 5' 2.5" (1.588 m)   Wt 216 lb 8 oz (98.2 kg)   SpO2 97%   BMI 38.97 kg/m   Wt Readings from Last 3 Encounters:   03/17/23 216 lb 8 oz (98.2 kg)  02/10/23 222 lb 4 oz (100.8 kg)  01/21/23 216 lb 4 oz (98.1 kg)      Physical Exam Vitals and nursing note reviewed.  Constitutional:      Appearance: Normal appearance. She is obese. She is not ill-appearing.     Comments: Ambulates with rollator  Eyes:     Extraocular Movements: Extraocular movements intact.     Conjunctiva/sclera: Conjunctivae normal.     Pupils: Pupils are equal, round, and reactive to light.  Cardiovascular:     Rate and Rhythm: Normal rate and regular rhythm.     Pulses: Normal pulses.     Heart sounds: Normal heart sounds. No murmur heard. Pulmonary:     Effort: Pulmonary effort is normal. No respiratory distress.     Breath sounds: Normal breath sounds. No wheezing, rhonchi or rales.  Musculoskeletal:     Right lower leg: Edema present.     Left lower leg: Edema present.     Comments: Wearing compression stockings  Skin:    General: Skin is warm and dry.     Findings: No rash.  Neurological:     Mental Status: She is alert.  Psychiatric:        Mood and Affect: Mood normal.        Behavior: Behavior normal.       Results for orders placed or performed in visit on 01/21/23  CBC with Differential/Platelet  Result Value Ref Range   WBC 6.5 4.0 - 10.5 K/uL   RBC 3.88 3.87 - 5.11 Mil/uL   Hemoglobin 11.1 (L) 12.0 - 15.0 g/dL   HCT 34.0 (L) 36.0 - 46.0 %   MCV 87.7 78.0 - 100.0 fl   MCHC 32.6 30.0 - 36.0 g/dL   RDW 14.9 11.5 - 15.5 %   Platelets 262.0 150.0 - 400.0 K/uL   Neutrophils Relative % 65.7 43.0 - 77.0 %   Lymphocytes Relative 19.4 12.0 - 46.0 %   Monocytes Relative 11.1 3.0 - 12.0 %   Eosinophils Relative 3.1 0.0 - 5.0 %   Basophils Relative 0.7 0.0 - 3.0 %   Neutro Abs 4.3 1.4 - 7.7 K/uL   Lymphs Abs 1.3 0.7 - 4.0 K/uL   Monocytes Absolute 0.7 0.1 - 1.0 K/uL   Eosinophils Absolute 0.2 0.0 - 0.7 K/uL   Basophils Absolute 0.0 0.0 - 0.1 K/uL  Comprehensive metabolic panel  Result Value Ref Range    Sodium 142 135 - 145 mEq/L   Potassium  4.4 3.5 - 5.1 mEq/L   Chloride 105 96 - 112 mEq/L   CO2 29 19 - 32 mEq/L   Glucose, Bld 89 70 - 99 mg/dL   BUN 20 6 - 23 mg/dL   Creatinine, Ser 1.42 (H) 0.40 - 1.20 mg/dL   Total Bilirubin 0.4 0.2 - 1.2 mg/dL   Alkaline Phosphatase 41 39 - 117 U/L   AST 13 0 - 37 U/L   ALT 8 0 - 35 U/L   Total Protein 6.5 6.0 - 8.3 g/dL   Albumin 3.9 3.5 - 5.2 g/dL   GFR 32.53 (L) >60.00 mL/min   Calcium 9.0 8.4 - 10.5 mg/dL  TSH  Result Value Ref Range   TSH 1.84 0.35 - 5.50 uIU/mL  Brain natriuretic peptide  Result Value Ref Range   Pro B Natriuretic peptide (BNP) 286.0 (H) 0.0 - 100.0 pg/mL  POCT urinalysis dipstick  Result Value Ref Range   Color, UA yellow    Clarity, UA cloudy    Glucose, UA Negative Negative   Bilirubin, UA negative    Ketones, UA negative    Spec Grav, UA 1.010 1.010 - 1.025   Blood, UA negative    pH, UA 6.0 5.0 - 8.0   Protein, UA Negative Negative   Urobilinogen, UA 0.2 0.2 or 1.0 E.U./dL   Nitrite, UA negative    Leukocytes, UA Trace (A) Negative   Appearance     Odor     Assessment & Plan:   Problem List Items Addressed This Visit     Hypertension    Chronic, controlled on current regimen - continue meds.       MDD (major depressive disorder), recurrent episode, moderate (HCC)    Chronic, overall stable to improved on current regimen of prozac 40mg  daily and trazodone 50mg  nightly - however due to daytime somnolence I recommended she drop trazodonetto 25mg  nightly. I also asked her to stop hydroxyzine 10mg  which she's been using PRN anxiety/sleep.       Relevant Medications   traZODone (DESYREL) 50 MG tablet   AMS (altered mental status)    This has largely improved       Polypharmacy - Primary    She's been taking meds 4 times a day - thyroid and tylenol in am followed by morning meds then PM meds and evening meds.  In order to simplify regimen ,I recommended they combine PM and QHS meds together.          Meds ordered this encounter  Medications   traZODone (DESYREL) 50 MG tablet    Sig: Take 0.5 tablets (25 mg total) by mouth at bedtime as needed for sleep.    Dispense:  45 tablet    Refill:  3    No orders of the defined types were placed in this encounter.   Patient Instructions  Continue prozac 40mg  daily Continue trazodone but only 1/2 tablet at night time Stop hydroxyzine.  Ok to take evening and bedtime medicines at the same time.  Keep May physical.   Follow up plan: No follow-ups on file.  [name], MD

## 2023-03-17 NOTE — Assessment & Plan Note (Signed)
This has largely improved

## 2023-03-17 NOTE — Assessment & Plan Note (Signed)
Chronic, overall stable to improved on current regimen of prozac 40mg  daily and trazodone 50mg  nightly - however due to daytime somnolence I recommended she drop trazodonetto 25mg  nightly. I also asked her to stop hydroxyzine 10mg  which she's been using PRN anxiety/sleep.

## 2023-03-30 ENCOUNTER — Other Ambulatory Visit: Payer: Self-pay | Admitting: Family Medicine

## 2023-04-14 DIAGNOSIS — H52223 Regular astigmatism, bilateral: Secondary | ICD-10-CM | POA: Diagnosis not present

## 2023-04-14 DIAGNOSIS — Z961 Presence of intraocular lens: Secondary | ICD-10-CM | POA: Diagnosis not present

## 2023-04-14 DIAGNOSIS — H18519 Endothelial corneal dystrophy, unspecified eye: Secondary | ICD-10-CM | POA: Diagnosis not present

## 2023-04-14 DIAGNOSIS — Z135 Encounter for screening for eye and ear disorders: Secondary | ICD-10-CM | POA: Diagnosis not present

## 2023-04-14 DIAGNOSIS — H5203 Hypermetropia, bilateral: Secondary | ICD-10-CM | POA: Diagnosis not present

## 2023-04-14 DIAGNOSIS — H524 Presbyopia: Secondary | ICD-10-CM | POA: Diagnosis not present

## 2023-04-15 ENCOUNTER — Ambulatory Visit: Payer: Medicare HMO | Admitting: Clinical

## 2023-04-15 DIAGNOSIS — F4321 Adjustment disorder with depressed mood: Secondary | ICD-10-CM

## 2023-04-15 NOTE — Progress Notes (Signed)
                Ritchie Klee, LCSW 

## 2023-04-15 NOTE — Progress Notes (Signed)
[facility] Counselor/Therapist Progress Note  Patient ID: [name], MRN: [id number],    Date: 04/15/2023  Time Spent: 2:37pm - 3:22pm : 45 minutes   Treatment Type: Individual Therapy  Reported Symptoms: Patient reported fatigue and drowsiness.  Mental Status Exam: Appearance:  Well Groomed     Behavior: Drowsy  Motor: Normal  Speech/Language:  Clear and Coherent  Affect: Appropriate  Mood: normal  Thought process: normal  Thought content:   WNL  Sensory/Perceptual disturbances:   WNL  Orientation: oriented to person  Attention: Good  Concentration: Good  Memory: Recent;   Poor  Fund of knowledge:  Good  Insight:   Poor  Judgment:  Good  Impulse Control: Good   Risk Assessment: Danger to Self:  No Patient denied current suicidal ideation  Self-injurious Behavior: No Danger to Others: No Patient denied current homicidal ideation  Duty to Warn:no Physical Aggression / Violence:No  Access to Firearms a concern: No  Gang Involvement:No   Subjective: Patient stated, "so so" in regards to events since last session. Patient reported experiencing "ups and downs" since last session. Patient stated, "its hard" in regards to being by herself. Patient reported she plays bingo three times per week and attends an exercise class twice per week.  Patient stated, "I'm ok" in response to current mood. Patient reported no current symptoms of psychosis. Patient reported she continues to experience fatigue and drowsiness. Patient reported she is going to bed around 12am - 1am every night due to watching television and reported she does not feel sleepy. Patient reported she typically wakes up around 6:30am-7am and reported she sleeps in when no activities are scheduled. Patient reported she drinks decaffeinated coffee and decaffeinated tea. Patient reported she sees her husband in her room at night and reported then "he just disappears". Patient reported "sometimes I feel  nervous" and reported feeling jittery. Patient reported she fears bad news. Patient reported her husband died suddenly of a heart attack. Patient stated, "sometimes I get the feeling I need somebody to talk to".   Interventions: Cognitive Behavioral Therapy and Interpersonal. Clinician conducted session in person at clinician's office at [facility]. Reviewed events since last session. Assessed patient's mood since last session and current mood/symptoms. Explored patient's sleep hygiene and provided psycho education related to healthy sleep hygiene. Discussed going to bed at 10pm to develop a routine. Explored and identified triggers for anxiety and feelings of isolation. Provided supportive therapy, active listening, and validation as patient discussed her husband's death. Discussed resources for support and socialization, such as, the friendship line. Discussed a consent for patient's primary care provider and patient agreed to consent.   Collaboration of Care: Primary Care Provider AEB discussion related to obtaining consent for communication with patient's primary care provider      Diagnosis:  Adjustment disorder with depressed mood     Plan: Patient is to utilize Cognitive Behavioral Therapy, thought re-framing, mindfulness and coping strategies to decrease symptoms associated with Adjustment Disorder with depressed mood. Frequency: bi-weekly  Modality: individual      Long-term goal:   Reduce overall level, frequency, and intensity of the feelings of depression and anxiety as evidenced by decrease in depressed mood, feeling lonely, and worry from 6 to 7 days/week to 0 to 1 days/week per patient report for at least 3 consecutive months.   Target Date: 02/06/24  Progress: progressing    Short-term goal:  Develop and implement strategies to increase socialization and support   Target  Date: 08/06/23  Progress: progressing    Process grief associated with the death of patient's  daughter   Target Date: 08/06/23  Progress: progressing                       Doree Barthel, LCSW

## 2023-04-17 ENCOUNTER — Other Ambulatory Visit: Payer: Self-pay | Admitting: Family Medicine

## 2023-04-17 DIAGNOSIS — I10 Essential (primary) hypertension: Secondary | ICD-10-CM

## 2023-04-21 ENCOUNTER — Telehealth: Payer: Self-pay

## 2023-04-21 NOTE — Telephone Encounter (Signed)
Please start new PA for patient for Prolia.  Last inj was on 11/19/2022.  Please respond to Barnie Mort at [facility].  I am just filling in for the week.  Thank you!

## 2023-04-22 NOTE — Telephone Encounter (Signed)
New encounter created for Prolia BIV 

## 2023-04-22 NOTE — Telephone Encounter (Signed)
Prolia VOB initiated via [email or web address] 

## 2023-04-25 ENCOUNTER — Other Ambulatory Visit (HOSPITAL_COMMUNITY): Payer: Self-pay

## 2023-04-25 ENCOUNTER — Encounter: Payer: Self-pay | Admitting: Cardiovascular Disease

## 2023-04-25 ENCOUNTER — Ambulatory Visit: Payer: Medicare HMO | Attending: Cardiovascular Disease | Admitting: Cardiovascular Disease

## 2023-04-25 VITALS — BP 132/60 | HR 66 | Ht 64.0 in | Wt 211.4 lb

## 2023-04-25 DIAGNOSIS — I5032 Chronic diastolic (congestive) heart failure: Secondary | ICD-10-CM | POA: Diagnosis not present

## 2023-04-25 DIAGNOSIS — I10 Essential (primary) hypertension: Secondary | ICD-10-CM

## 2023-04-25 DIAGNOSIS — E782 Mixed hyperlipidemia: Secondary | ICD-10-CM

## 2023-04-25 NOTE — Telephone Encounter (Signed)
Pharmacy Patient Advocate Encounter   Received notification that prior authorization for Prolia 60mg  is required/requested.  Per Test Claim: $95 with pharmacy benefit   PA submitted on 04/25/23 to (ins) [employer] via CoverMyMeds Key or (Medicaid) confirmation #  [id number] Status is pending

## 2023-04-25 NOTE — Assessment & Plan Note (Signed)
History of essential hypertension blood pressure measured today 132/60.  She is on lisinopril and hydralazine as well as Toprol.

## 2023-04-25 NOTE — Progress Notes (Signed)
04/25/2023 [name]   05/02/1930  [id number]  Primary Physician Eustaquio Boyden, MD Primary Cardiologist: Runell Gess MD Nicholes Calamity, [place]  HPI:  [name] is a 89 y.o.  moderately overweight widowed Caucasian female mother of 2 living children (2 deceased), grandmother of 5 grandchildren who was referred by Dr. Sharen Hones for evaluation of dyspnea.  She is very hard of hearing.    I last saw her in the office 03/13/2022.  She is accompanied by her daughter [name] today.  She lives in an assisted care facility but does her own cooking but no longer lives.  Her risk factors include treated hypertension hyperlipidemia.  She is not diabetic.  She is never smoked.  There is no family history for heart disease.  She is never had a heart attack or stroke.  She does not denies chest pain.  She is complained of some dyspnea since being quarantined during COVID-19.  Recent 2D echo performed 09/10/2019 revealed normal LV systolic function with severe asymmetric left ventricular hypertrophy of the basal septal wall without S.A.M.  Is possible that her dyspnea is related to this.  I am going to start her on low-dose verapamil and will reassess   Since I saw her in the office a year ago she continues to do well.  She has chronic shortness of breath on inhaled bronchodilators.  She denies chest pain.   Current Meds  Medication Sig   acetaminophen (TYLENOL) 500 MG tablet Take 1,000 mg by mouth daily.   albuterol (VENTOLIN HFA) 108 (90 Base) MCG/ACT inhaler Inhale 1 - 2 puffs into the lungs every 6 hours as needed for wheezing or shortness of breath.   calcium gluconate 500 MG tablet Take 1 tablet (500 mg total) by mouth daily.   cholecalciferol (VITAMIN D) 1000 UNITS tablet Take 1,000 Units by mouth daily.   denosumab (PROLIA) 60 MG/ML SOSY injection Inject 60 mg into the skin every 6 (six) months.   diphenhydramine-acetaminophen (TYLENOL PM) 25-500 MG TABS tablet Take 1 tablet by mouth  at bedtime as needed (sleep).   docusate sodium (COLACE) 100 MG capsule Take 100 mg by mouth 2 (two) times daily.   famotidine (PEPCID) 20 MG tablet Take 20 mg by mouth daily.   FLUoxetine (PROZAC) 40 MG capsule TAKE 1 CAPSULE EVERY DAY   furosemide (LASIX) 20 MG tablet Take 1 tablet (20 mg total) by mouth daily.   hydrALAZINE (APRESOLINE) 25 MG tablet TAKE 1 TABLET IN THE MORNING AND AT BEDTIME.   levothyroxine (SYNTHROID) 88 MCG tablet TAKE 1 TABLET EVERY DAY BEFORE BREAKFAST   lisinopril (ZESTRIL) 40 MG tablet TAKE 1 TABLET EVERY DAY   loratadine (CLARITIN) 10 MG tablet TAKE 1 TABLET EVERY EVENING   metoprolol succinate (TOPROL-XL) 25 MG 24 hr tablet TAKE 1 TABLET EVERY DAY   mirabegron ER (MYRBETRIQ) 25 MG TB24 tablet TAKE 1 TABLET EVERY DAY   omeprazole (PRILOSEC) 40 MG capsule TAKE 1 CAPSULE EVERY DAY   polyethylene glycol (MIRALAX / GLYCOLAX) 17 g packet Take 17 g by mouth daily as needed for mild constipation.   pravastatin (PRAVACHOL) 40 MG tablet TAKE 1 TABLET EVERY DAY   Propylene Glycol (SYSTANE BALANCE OP) Place 1 drop into both eyes daily as needed (dry eyes).   traZODone (DESYREL) 50 MG tablet Take 0.5 tablets (25 mg total) by mouth at bedtime as needed for sleep.   verapamil (CALAN-SR) 240 MG CR tablet TAKE 1 TABLET EVERY DAY  vitamin B-12 (CYANOCOBALAMIN) 500 MCG tablet Take 1 tablet (500 mcg total) by mouth once a week.     No Known Allergies  Social History   Socioeconomic History   Marital status: Widowed    Spouse name: Not on file   Number of children: Not on file   Years of education: Not on file   Highest education level: Not on file  Occupational History   Not on file  Tobacco Use   Smoking status: Former    Types: Cigarettes    Quit date: 12/30/1992    Years since quitting: 30.3   Smokeless tobacco: Never  Vaping Use   Vaping Use: Never used  Substance and Sexual Activity   Alcohol use: No   Drug use: No   Sexual activity: Not Currently  Other  Topics Concern   Not on file  Social History Narrative   Widower since 05-23-99 - husband battled cancer for 6 yrs, died from MI   Daughter Karle Desrosier) passed away 2015/05/23   Lies in retirement home - [facility] on [address].   Occupation: retired   HS: 12th grade   Activity: no regular exercise   Diet: good water, fruits/vegetables daily   Social Determinants of Health   Financial Resource Strain: Low Risk  (04/19/2022)   Overall Financial Resource Strain (CARDIA)    Difficulty of Paying Living Expenses: Not hard at all  Food Insecurity: No Food Insecurity (08/27/2022)   Hunger Vital Sign    Worried About Running Out of Food in the Last Year: Never true    Ran Out of Food in the Last Year: Never true  Transportation Needs: No Transportation Needs (08/27/2022)   PRAPARE - Administrator, Civil Service (Medical): No    Lack of Transportation (Non-Medical): No  Physical Activity: Insufficiently Active (04/19/2022)   Exercise Vital Sign    Days of Exercise per Week: 2 days    Minutes of Exercise per Session: 50 min  Stress: No Stress Concern Present (04/19/2022)   [employer] of Occupational Health - Occupational Stress Questionnaire    Feeling of Stress : Not at all  Social Connections: Not on file  Intimate Partner Violence: Not on file     Review of Systems: General: negative for chills, fever, night sweats or weight changes.  Cardiovascular: negative for chest pain, dyspnea on exertion, edema, orthopnea, palpitations, paroxysmal nocturnal dyspnea or shortness of breath Dermatological: negative for rash Respiratory: negative for cough or wheezing Urologic: negative for hematuria Abdominal: negative for nausea, vomiting, diarrhea, bright red blood per rectum, melena, or hematemesis Neurologic: negative for visual changes, syncope, or dizziness All other systems reviewed and are otherwise negative except as noted above.    Blood pressure 132/60, pulse 66, height 5'  4" (1.626 m), weight 211 lb 6.4 oz (95.9 kg), SpO2 97 %.  General appearance: alert and no distress Neck: no adenopathy, no carotid bruit, no JVD, supple, symmetrical, trachea midline, and thyroid not enlarged, symmetric, no tenderness/mass/nodules Lungs: clear to auscultation bilaterally Heart: regular rate and rhythm, S1, S2 normal, no murmur, click, rub or gallop Extremities: extremities normal, atraumatic, no cyanosis or edema Pulses: 2+ and symmetric Skin: Skin color, texture, turgor normal. No rashes or lesions Neurologic: Grossly normal  EKG sinus rhythm at 66 with septal Q waves, left axis deviation and poor R wave progression.  Personally reviewed this EKG.  ASSESSMENT AND PLAN:   Hyperlipidemia History of hyperlipidemia on statin therapy with lipid profile performed 05/07/2022 revealing  total cholesterol 134, LDL 51 HDL 55.  Hypertension History of essential hypertension blood pressure measured today 132/60.  She is on lisinopril and hydralazine as well as Toprol.  Chronic diastolic CHF (congestive heart failure) (HCC) History of chronic diastolic heart failure on oral diuretics.     Runell Gess MD FACP,FACC,FAHA, [facility] 04/25/2023 12:12 PM

## 2023-04-25 NOTE — Patient Instructions (Addendum)
Medication Instructions:  Your physician recommends that you continue on your current medications as directed. Please refer to the Current Medication list given to you today.  *If you need a refill on your cardiac medications before your next appointment, please call your pharmacy*   Follow-Up: At [facility], you and your health needs are our priority.  As part of our continuing mission to provide you with exceptional heart care, we have created designated Provider Care Teams.  These Care Teams include your primary Cardiologist (physician) and Advanced Practice Providers (APPs -  Physician Assistants and Nurse Practitioners) who all work together to provide you with the care you need, when you need it.  We recommend signing up for the patient portal called "MyChart".  Sign up information is provided on this After Visit Summary.  MyChart is used to connect with patients for Virtual Visits (Telemedicine).  Patients are able to view lab/test results, encounter notes, upcoming appointments, etc.  Non-urgent messages can be sent to your provider as well.   To learn more about what you can do with MyChart, go to [email or web address].    Your next appointment:   12 month(s)  Provider:   Jonathan Berry, MD    

## 2023-04-25 NOTE — Assessment & Plan Note (Signed)
History of hyperlipidemia on statin therapy with lipid profile performed 05/07/2022 revealing total cholesterol 134, LDL 51 HDL 55.

## 2023-04-25 NOTE — Assessment & Plan Note (Signed)
History of chronic diastolic heart failure on oral diuretics. 

## 2023-05-10 ENCOUNTER — Other Ambulatory Visit: Payer: Self-pay | Admitting: Family Medicine

## 2023-05-10 DIAGNOSIS — E039 Hypothyroidism, unspecified: Secondary | ICD-10-CM

## 2023-05-10 DIAGNOSIS — E782 Mixed hyperlipidemia: Secondary | ICD-10-CM

## 2023-05-10 DIAGNOSIS — D500 Iron deficiency anemia secondary to blood loss (chronic): Secondary | ICD-10-CM

## 2023-05-10 DIAGNOSIS — N2581 Secondary hyperparathyroidism of renal origin: Secondary | ICD-10-CM

## 2023-05-10 DIAGNOSIS — N1832 Chronic kidney disease, stage 3b: Secondary | ICD-10-CM

## 2023-05-12 ENCOUNTER — Other Ambulatory Visit (INDEPENDENT_AMBULATORY_CARE_PROVIDER_SITE_OTHER): Payer: Medicare HMO

## 2023-05-12 DIAGNOSIS — N1832 Chronic kidney disease, stage 3b: Secondary | ICD-10-CM

## 2023-05-12 DIAGNOSIS — E782 Mixed hyperlipidemia: Secondary | ICD-10-CM

## 2023-05-12 DIAGNOSIS — N2581 Secondary hyperparathyroidism of renal origin: Secondary | ICD-10-CM | POA: Diagnosis not present

## 2023-05-12 DIAGNOSIS — D500 Iron deficiency anemia secondary to blood loss (chronic): Secondary | ICD-10-CM | POA: Diagnosis not present

## 2023-05-12 LAB — COMPREHENSIVE METABOLIC PANEL
ALT: 7 U/L (ref 0–35)
AST: 12 U/L (ref 0–37)
Albumin: 3.6 g/dL (ref 3.5–5.2)
Alkaline Phosphatase: 36 U/L — ABNORMAL LOW (ref 39–117)
BUN: 16 mg/dL (ref 6–23)
CO2: 29 mEq/L (ref 19–32)
Calcium: 10 mg/dL (ref 8.4–10.5)
Chloride: 105 mEq/L (ref 96–112)
Creatinine, Ser: 1.42 mg/dL — ABNORMAL HIGH (ref 0.40–1.20)
GFR: 32.47 mL/min — ABNORMAL LOW (ref >60.00)
Glucose, Bld: 88 mg/dL (ref 70–99)
Potassium: 4.1 mEq/L (ref 3.5–5.1)
Sodium: 142 mEq/L (ref 135–145)
Total Bilirubin: 0.4 mg/dL (ref 0.2–1.2)
Total Protein: 6.4 g/dL (ref 6.0–8.3)

## 2023-05-12 LAB — CBC WITH DIFFERENTIAL/PLATELET
Basophils Absolute: 0.1 10*3/uL (ref 0.0–0.1)
Basophils Relative: 1.1 % (ref 0.0–3.0)
Eosinophils Absolute: 0.1 10*3/uL (ref 0.0–0.7)
Eosinophils Relative: 2.2 % (ref 0.0–5.0)
HCT: 33.7 % — ABNORMAL LOW (ref 36.0–46.0)
Hemoglobin: 10.9 g/dL — ABNORMAL LOW (ref 12.0–15.0)
Lymphocytes Relative: 16.3 % (ref 12.0–46.0)
Lymphs Abs: 0.9 10*3/uL (ref 0.7–4.0)
MCHC: 32.3 g/dL (ref 30.0–36.0)
MCV: 87 fl (ref 78.0–100.0)
Monocytes Absolute: 0.6 10*3/uL (ref 0.1–1.0)
Monocytes Relative: 10.2 % (ref 3.0–12.0)
Neutro Abs: 4 10*3/uL (ref 1.4–7.7)
Neutrophils Relative %: 70.2 % (ref 43.0–77.0)
Platelets: 201 10*3/uL (ref 150.0–400.0)
RBC: 3.88 Mil/uL (ref 3.87–5.11)
RDW: 15 % (ref 11.5–15.5)
WBC: 5.7 10*3/uL (ref 4.0–10.5)

## 2023-05-12 LAB — MICROALBUMIN / CREATININE URINE RATIO
Creatinine,U: 132.7 mg/dL
Microalb Creat Ratio: 1 mg/g (ref 0.0–30.0)
Microalb, Ur: 1.4 mg/dL (ref 0.0–1.9)

## 2023-05-12 LAB — IBC PANEL
Iron: 41 ug/dL — ABNORMAL LOW (ref 42–145)
Saturation Ratios: 15.3 % — ABNORMAL LOW (ref 20.0–50.0)
TIBC: 268.8 ug/dL (ref 250.0–450.0)
Transferrin: 192 mg/dL — ABNORMAL LOW (ref 212.0–360.0)

## 2023-05-12 LAB — FERRITIN: Ferritin: 35.8 ng/mL (ref 10.0–291.0)

## 2023-05-12 LAB — LIPID PANEL
Cholesterol: 112 mg/dL (ref 0–200)
HDL: 58.1 mg/dL (ref >39.00)
LDL Cholesterol: 39 mg/dL (ref 0–99)
NonHDL: 54.35
Total CHOL/HDL Ratio: 2
Triglycerides: 77 mg/dL (ref 0.0–149.0)
VLDL: 15.4 mg/dL (ref 0.0–40.0)

## 2023-05-12 LAB — PHOSPHORUS: Phosphorus: 3.3 mg/dL (ref 2.3–4.6)

## 2023-05-12 LAB — VITAMIN D 25 HYDROXY (VIT D DEFICIENCY, FRACTURES): VITD: 48.34 ng/mL (ref 30.00–100.00)

## 2023-05-13 LAB — PARATHYROID HORMONE, INTACT (NO CA): PTH: 34 pg/mL (ref 16–77)

## 2023-05-19 ENCOUNTER — Telehealth: Payer: Self-pay | Admitting: Family Medicine

## 2023-05-19 ENCOUNTER — Ambulatory Visit (INDEPENDENT_AMBULATORY_CARE_PROVIDER_SITE_OTHER): Payer: Medicare HMO | Admitting: Family Medicine

## 2023-05-19 ENCOUNTER — Encounter: Payer: Self-pay | Admitting: Family Medicine

## 2023-05-19 VITALS — BP 126/60 | HR 74 | Temp 97.8°F | Ht 61.0 in | Wt 201.4 lb

## 2023-05-19 DIAGNOSIS — Z1231 Encounter for screening mammogram for malignant neoplasm of breast: Secondary | ICD-10-CM

## 2023-05-19 DIAGNOSIS — Z0000 Encounter for general adult medical examination without abnormal findings: Secondary | ICD-10-CM | POA: Diagnosis not present

## 2023-05-19 DIAGNOSIS — I10 Essential (primary) hypertension: Secondary | ICD-10-CM

## 2023-05-19 DIAGNOSIS — R413 Other amnesia: Secondary | ICD-10-CM

## 2023-05-19 DIAGNOSIS — Z87898 Personal history of other specified conditions: Secondary | ICD-10-CM

## 2023-05-19 DIAGNOSIS — N1832 Chronic kidney disease, stage 3b: Secondary | ICD-10-CM

## 2023-05-19 DIAGNOSIS — N183 Chronic kidney disease, stage 3 unspecified: Secondary | ICD-10-CM | POA: Diagnosis not present

## 2023-05-19 DIAGNOSIS — F331 Major depressive disorder, recurrent, moderate: Secondary | ICD-10-CM

## 2023-05-19 DIAGNOSIS — N3941 Urge incontinence: Secondary | ICD-10-CM

## 2023-05-19 DIAGNOSIS — D649 Anemia, unspecified: Secondary | ICD-10-CM

## 2023-05-19 DIAGNOSIS — N2581 Secondary hyperparathyroidism of renal origin: Secondary | ICD-10-CM

## 2023-05-19 DIAGNOSIS — K219 Gastro-esophageal reflux disease without esophagitis: Secondary | ICD-10-CM

## 2023-05-19 DIAGNOSIS — R600 Localized edema: Secondary | ICD-10-CM

## 2023-05-19 DIAGNOSIS — E039 Hypothyroidism, unspecified: Secondary | ICD-10-CM

## 2023-05-19 DIAGNOSIS — I5032 Chronic diastolic (congestive) heart failure: Secondary | ICD-10-CM

## 2023-05-19 DIAGNOSIS — E782 Mixed hyperlipidemia: Secondary | ICD-10-CM

## 2023-05-19 DIAGNOSIS — J309 Allergic rhinitis, unspecified: Secondary | ICD-10-CM

## 2023-05-19 DIAGNOSIS — Z7189 Other specified counseling: Secondary | ICD-10-CM

## 2023-05-19 DIAGNOSIS — Z79899 Other long term (current) drug therapy: Secondary | ICD-10-CM

## 2023-05-19 DIAGNOSIS — M810 Age-related osteoporosis without current pathological fracture: Secondary | ICD-10-CM

## 2023-05-19 MED ORDER — METOPROLOL SUCCINATE ER 25 MG PO TB24
25.0000 mg | ORAL_TABLET | Freq: Every day | ORAL | 4 refills | Status: AC
Start: 2023-05-19 — End: ?

## 2023-05-19 MED ORDER — VERAPAMIL HCL ER 240 MG PO TBCR: 240.0000 mg | EXTENDED_RELEASE_TABLET | Freq: Every day | ORAL | 4 refills | Status: DC

## 2023-05-19 MED ORDER — PRAVASTATIN SODIUM 40 MG PO TABS: 40.0000 mg | ORAL_TABLET | Freq: Every day | ORAL | 4 refills | Status: DC

## 2023-05-19 MED ORDER — OMEPRAZOLE 40 MG PO CPDR: 40.0000 mg | DELAYED_RELEASE_CAPSULE | Freq: Every day | ORAL | 4 refills | Status: DC

## 2023-05-19 MED ORDER — IRON (FERROUS SULFATE) 325 (65 FE) MG PO TABS: 325.0000 mg | ORAL_TABLET | ORAL | Status: AC

## 2023-05-19 MED ORDER — LEVOTHYROXINE SODIUM 88 MCG PO TABS: ORAL_TABLET | ORAL | 4 refills | Status: DC

## 2023-05-19 MED ORDER — MIRABEGRON ER 25 MG PO TB24: 25.0000 mg | ORAL_TABLET | Freq: Every day | ORAL | 4 refills | Status: DC

## 2023-05-19 MED ORDER — DONEPEZIL HCL 5 MG PO TABS
5.0000 mg | ORAL_TABLET | Freq: Every day | ORAL | 9 refills | Status: DC
Start: 2023-05-19 — End: 2024-02-09

## 2023-05-19 MED ORDER — LORATADINE 10 MG PO TABS: 10.0000 mg | ORAL_TABLET | Freq: Every evening | ORAL | 4 refills | Status: DC

## 2023-05-19 MED ORDER — FLUOXETINE HCL 40 MG PO CAPS: 40.0000 mg | ORAL_CAPSULE | Freq: Every day | ORAL | 4 refills | Status: DC

## 2023-05-19 MED ORDER — LISINOPRIL 40 MG PO TABS: 40.0000 mg | ORAL_TABLET | Freq: Every day | ORAL | 4 refills | Status: DC

## 2023-05-19 MED ORDER — HYDRALAZINE HCL 25 MG PO TABS
ORAL_TABLET | ORAL | 4 refills | Status: AC
Start: 2023-05-19 — End: ?

## 2023-05-19 NOTE — Assessment & Plan Note (Addendum)
Daughter notes worsening memory difficulty. Noted significantly hard of hearing despite hearing aides. Discussed relation of hearing loss with cognitive awareness.  MMSE 25/30, CDT 2/4 - deterioration from prior. Start aricept 5mg  nightly, watching for GI upset, cardiac conduction abnormalities. Continue PPI + pepcid daily.

## 2023-05-19 NOTE — Assessment & Plan Note (Signed)
Stop trazodone

## 2023-05-19 NOTE — Assessment & Plan Note (Signed)
Chronic, stable on current regimen including pravastatin.  The ASCVD Risk score (Arnett DK, et al., 2019) failed to calculate for the following reasons:   The 2019 ASCVD risk score is only valid for ages 70 to 39

## 2023-05-19 NOTE — Assessment & Plan Note (Signed)
Chronic, stable continue current regimen.  

## 2023-05-19 NOTE — Assessment & Plan Note (Signed)
Preventative protocols reviewed and updated unless pt declined. Discussed healthy diet and lifestyle.  

## 2023-05-19 NOTE — Assessment & Plan Note (Signed)
H/o this, stable on low dose lasix.

## 2023-05-19 NOTE — Assessment & Plan Note (Signed)
This is despite lasix 20mg  daily - she does note good UOP on this.

## 2023-05-19 NOTE — Assessment & Plan Note (Signed)
Stable with GFR 30s. She drinks significant tea. Encouraged increased water intake.

## 2023-05-19 NOTE — Telephone Encounter (Signed)
Can we set patient up for rpt prolia?  Thank you.  CrCl = 38.  Lab Results  Component Value Date   CREATININE 1.42 (H) 05/12/2023   BUN 16 05/12/2023   NA 142 05/12/2023   K 4.1 05/12/2023   CL 105 05/12/2023   CO2 29 05/12/2023  Ca 10.0

## 2023-05-19 NOTE — Assessment & Plan Note (Signed)

## 2023-05-19 NOTE — Assessment & Plan Note (Signed)
Chronic, stable on levothyroxine 88mcg daily - continue this.  

## 2023-05-19 NOTE — Assessment & Plan Note (Addendum)
Due for prolia- will send Joellen request to get this scheduled.

## 2023-05-19 NOTE — Progress Notes (Signed)
Ph: [phone] Fax: [phone]   Patient ID: [name], female    DOB: [birth year], 89 y.o.   MRN: [id number]  This visit was conducted in person.  BP 126/60 (BP Location: Right Arm, Cuff Size: Large)   Pulse 74   Temp 97.8 F (36.6 C) (Temporal)   Ht 5\' 1"  (1.549 m)   Wt 201 lb 6 oz (91.3 kg)   SpO2 95%   BMI 38.05 kg/m   BP Readings from Last 3 Encounters:  05/19/23 126/60  04/25/23 132/60  03/17/23 138/66    CC: AMW/CPE Subjective:   HPI: [name] is a 89 y.o. female presenting on 05/19/2023 for Medicare Wellness (Pt accompanied by daughter, [name]. )   Did not see health advisor this year.   Hearing Screening - Comments:: Wears B hearing aids. Wearing at today's OV.  Vision Screening - Comments:: Last eye exam, 03/2023.  Flowsheet Row Office Visit from 02/10/2023 in [facility] [place]  PHQ-2 Total Score 3          02/10/2023   11:55 AM 02/10/2023   11:03 AM 01/21/2023    2:18 PM 04/19/2022   10:57 AM 10/11/2020    2:18 PM  Fall Risk   Falls in the past year? 1 1 1  0 0  Number falls in past yr: 0 0 1 0   Injury with Fall? 0 0 0 0   Risk for fall due to :    Impaired balance/gait;Impaired mobility;Medication side effect   Follow up    Falls evaluation completed;Education provided;Falls prevention discussed   Last month fell at home while trying to put in bottom drawer to nightstand. EMT helped her get back up - fortunately no injury.   15 lb weight loss in the past 2 months.  Saw Dr Allyson Sabal 03/2023 - good report.   Notes worsening memory difficulty - getting lost getting to room, confused looking at her bank account because she's looking at 2022 not 2024 numbers, still some difficulty taking meds - daughter helps her with this. Daughter feels she may need further assistance at home. Last saw audiologist 08/2022.   Grand daughter is oncology NP.   MMSE: 25/30 CDT: 2/4  Preventative: Colonoscopy - had normal colonoscopy per  pt years ago.  Normal iFOB 2013.  Has aged out.  Mammogram - 11/2020, Bir[facility]1 @ [place], declines repeat.  Well woman - aged out. Declines pelvic exam.  DEXA 03/2016 at [facility] - T -3.1 hip, -1.0 spine.  DEXA 10/2019 - T -3.1 femur, -0.9 spine.  Last prolia injection 11/19/2022.  Lung cancer screening - not eligible  Flu - yearly Pneumovax - 09/2012.  Prevnar-13 - 02/2014 COVID vaccine - Moderna 01/2020, 02/2020, 1 booster Tetanus - 2010  zostavax - 12/2011  Shingrix - discussed, to check at pharmacy Advanced directive: scanned into chart 06/2022. HCPO - daughter [name] then [facility]. Would want full code, ok with feeding tube as long as not brain dead. Does not want prolonged life support if terminal condition. Wants living will followed.  Seat belt use discussed  Sunscreen use discussed. No changing moles on skin.  Non smoker  Alcohol - none  Dentist - does not see - has full dentures  Eye exam - yearly  Bowels - no constipation Bladder - some urge incontinence especially with caffeine, she wears pads  -  Widower since 2000 - husband battled cancer for 6 yrs, died from MI. Daughter (  [name]) passed away 2016. Other daughter [name] nearby.  Lives in retirement home - [facility] on [address].  Occupation: retired  HS: 12th grade  Activity: no regular exercise  Diet: good water, fruits/vegetables daily      Relevant past medical, surgical, family and social history reviewed and updated as indicated. Interim medical history since our last visit reviewed. Allergies and medications reviewed and updated. Outpatient Medications Prior to Visit  Medication Sig Dispense Refill   acetaminophen (TYLENOL) 500 MG tablet Take 1,000 mg by mouth daily.     albuterol (VENTOLIN HFA) 108 (90 Base) MCG/ACT inhaler Inhale 1 - 2 puffs into the lungs every 6 hours as needed for wheezing or shortness of breath. 18 g 0   calcium gluconate 500 MG tablet Take 1 tablet (500 mg total) by  mouth daily.     cholecalciferol (VITAMIN D) 1000 UNITS tablet Take 1,000 Units by mouth daily.     denosumab (PROLIA) 60 MG/ML SOSY injection Inject 60 mg into the skin every 6 (six) months.     diphenhydramine-acetaminophen (TYLENOL PM) 25-500 MG TABS tablet Take 1 tablet by mouth at bedtime as needed (sleep).     docusate sodium (COLACE) 100 MG capsule Take 100 mg by mouth 2 (two) times daily.     famotidine (PEPCID) 20 MG tablet Take 20 mg by mouth daily.     furosemide (LASIX) 20 MG tablet Take 1 tablet (20 mg total) by mouth daily. 90 tablet 1   polyethylene glycol (MIRALAX / GLYCOLAX) 17 g packet Take 17 g by mouth daily as needed for mild constipation. 14 each 0   Propylene Glycol (SYSTANE BALANCE OP) Place 1 drop into both eyes daily as needed (dry eyes).     vitamin B-12 (CYANOCOBALAMIN) 500 MCG tablet Take 1 tablet (500 mcg total) by mouth once a week.     FLUoxetine (PROZAC) 40 MG capsule TAKE 1 CAPSULE EVERY DAY 90 capsule 2   hydrALAZINE (APRESOLINE) 25 MG tablet TAKE 1 TABLET IN THE MORNING AND AT BEDTIME. 180 tablet 0   levothyroxine (SYNTHROID) 88 MCG tablet TAKE 1 TABLET EVERY DAY BEFORE BREAKFAST 90 tablet 2   lisinopril (ZESTRIL) 40 MG tablet TAKE 1 TABLET EVERY DAY 90 tablet 1   loratadine (CLARITIN) 10 MG tablet TAKE 1 TABLET EVERY EVENING 90 tablet 0   metoprolol succinate (TOPROL-XL) 25 MG 24 hr tablet TAKE 1 TABLET EVERY DAY 90 tablet 3   mirabegron ER (MYRBETRIQ) 25 MG TB24 tablet TAKE 1 TABLET EVERY DAY 90 tablet 2   omeprazole (PRILOSEC) 40 MG capsule TAKE 1 CAPSULE EVERY DAY 90 capsule 2   pravastatin (PRAVACHOL) 40 MG tablet TAKE 1 TABLET EVERY DAY 90 tablet 1   traZODone (DESYREL) 50 MG tablet Take 0.5 tablets (25 mg total) by mouth at bedtime as needed for sleep. 45 tablet 3   verapamil (CALAN-SR) 240 MG CR tablet TAKE 1 TABLET EVERY DAY 90 tablet 0   No facility-administered medications prior to visit.     Per HPI unless specifically indicated in ROS section  below Review of Systems  Constitutional:  Negative for activity change, appetite change, chills, fatigue, fever and unexpected weight change.  HENT:  Negative for hearing loss.   Eyes:  Negative for visual disturbance.  Respiratory:  Positive for shortness of breath. Negative for cough, chest tightness and wheezing.   Cardiovascular:  Positive for leg swelling. Negative for chest pain and palpitations.  Gastrointestinal:  Negative for abdominal distention, abdominal pain, blood  in stool, constipation, diarrhea, nausea and vomiting.  Genitourinary:  Negative for difficulty urinating and hematuria.  Musculoskeletal:  Negative for arthralgias, myalgias and neck pain.  Skin:  Negative for rash.  Neurological:  Negative for dizziness, seizures, syncope and headaches.  Hematological:  Negative for adenopathy. Does not bruise/bleed easily.  Psychiatric/Behavioral:  Negative for dysphoric mood. The patient is not nervous/anxious.     Objective:  BP 126/60 (BP Location: Right Arm, Cuff Size: Large)   Pulse 74   Temp 97.8 F (36.6 C) (Temporal)   Ht 5\' 1"  (1.549 m)   Wt 201 lb 6 oz (91.3 kg)   SpO2 95%   BMI 38.05 kg/m   Wt Readings from Last 3 Encounters:  05/19/23 201 lb 6 oz (91.3 kg)  04/25/23 211 lb 6.4 oz (95.9 kg)  03/17/23 216 lb 8 oz (98.2 kg)      Physical Exam Vitals and nursing note reviewed.  Constitutional:      Appearance: Normal appearance. She is not ill-appearing.  HENT:     Head: Normocephalic and atraumatic.     Right Ear: Tympanic membrane, ear canal and external ear normal. There is no impacted cerumen.     Left Ear: Tympanic membrane, ear canal and external ear normal. There is no impacted cerumen.     Mouth/Throat:     Mouth: Mucous membranes are moist.     Pharynx: Oropharynx is clear. No oropharyngeal exudate or posterior oropharyngeal erythema.  Eyes:     General:        Right eye: No discharge.        Left eye: No discharge.     Extraocular Movements:  Extraocular movements intact.     Conjunctiva/sclera: Conjunctivae normal.     Pupils: Pupils are equal, round, and reactive to light.  Neck:     Thyroid: No thyroid mass or thyromegaly.  Cardiovascular:     Rate and Rhythm: Normal rate and regular rhythm.     Pulses: Normal pulses.     Heart sounds: Normal heart sounds. No murmur heard. Pulmonary:     Effort: Pulmonary effort is normal. No respiratory distress.     Breath sounds: Normal breath sounds. No wheezing, rhonchi or rales.  Abdominal:     General: Bowel sounds are normal. There is no distension.     Palpations: Abdomen is soft. There is no mass.     Tenderness: There is no abdominal tenderness. There is no guarding or rebound.     Hernia: No hernia is present.  Musculoskeletal:     Cervical back: Normal range of motion and neck supple. No rigidity.     Right lower leg: Edema (1+) present.     Left lower leg: Edema (1+) present.  Lymphadenopathy:     Cervical: No cervical adenopathy.  Skin:    General: Skin is warm and dry.     Findings: No rash.  Neurological:     General: No focal deficit present.     Mental Status: She is alert. Mental status is at baseline.     Comments:  1/3 recall (3 with cue) 5/5 Calculation DLROW  Psychiatric:        Mood and Affect: Mood normal.        Behavior: Behavior normal.       Results for orders placed or performed in visit on 05/12/23  IBC panel  Result Value Ref Range   Iron 41 (L) 42 - 145 ug/dL   Transferrin 295.6 (L)  212.0 - 360.0 mg/dL   Saturation Ratios 04.5 (L) 20.0 - 50.0 %   TIBC 268.8 250.0 - 450.0 mcg/dL  Ferritin  Result Value Ref Range   Ferritin 35.8 10.0 - 291.0 ng/mL  Comprehensive metabolic panel  Result Value Ref Range   Sodium 142 135 - 145 mEq/L   Potassium 4.1 3.5 - 5.1 mEq/L   Chloride 105 96 - 112 mEq/L   CO2 29 19 - 32 mEq/L   Glucose, Bld 88 70 - 99 mg/dL   BUN 16 6 - 23 mg/dL   Creatinine, Ser 4.09 (H) 0.40 - 1.20 mg/dL   Total Bilirubin  0.4 0.2 - 1.2 mg/dL   Alkaline Phosphatase 36 (L) 39 - 117 U/L   AST 12 0 - 37 U/L   ALT 7 0 - 35 U/L   Total Protein 6.4 6.0 - 8.3 g/dL   Albumin 3.6 3.5 - 5.2 g/dL   GFR 81.19 (L) >14.78 mL/min   Calcium 10.0 8.4 - 10.5 mg/dL  Lipid panel  Result Value Ref Range   Cholesterol 112 0 - 200 mg/dL   Triglycerides 29.5 0.0 - 149.0 mg/dL   HDL 62.13 >08.65 mg/dL   VLDL 78.4 0.0 - 69.6 mg/dL   LDL Cholesterol 39 0 - 99 mg/dL   Total CHOL/HDL Ratio 2    NonHDL 54.35   CBC with Differential/Platelet  Result Value Ref Range   WBC 5.7 4.0 - 10.5 K/uL   RBC 3.88 3.87 - 5.11 Mil/uL   Hemoglobin 10.9 (L) 12.0 - 15.0 g/dL   HCT 29.5 (L) 28.4 - 13.2 %   MCV 87.0 78.0 - 100.0 fl   MCHC 32.3 30.0 - 36.0 g/dL   RDW 44.0 10.2 - 72.5 %   Platelets 201.0 150.0 - 400.0 K/uL   Neutrophils Relative % 70.2 43.0 - 77.0 %   Lymphocytes Relative 16.3 12.0 - 46.0 %   Monocytes Relative 10.2 3.0 - 12.0 %   Eosinophils Relative 2.2 0.0 - 5.0 %   Basophils Relative 1.1 0.0 - 3.0 %   Neutro Abs 4.0 1.4 - 7.7 K/uL   Lymphs Abs 0.9 0.7 - 4.0 K/uL   Monocytes Absolute 0.6 0.1 - 1.0 K/uL   Eosinophils Absolute 0.1 0.0 - 0.7 K/uL   Basophils Absolute 0.1 0.0 - 0.1 K/uL  Parathyroid hormone, intact (no Ca)  Result Value Ref Range   PTH 34 16 - 77 pg/mL  Microalbumin / creatinine urine ratio  Result Value Ref Range   Microalb, Ur 1.4 0.0 - 1.9 mg/dL   Creatinine,U 366.4 mg/dL   Microalb Creat Ratio 1.0 0.0 - 30.0 mg/g  VITAMIN D 25 Hydroxy (Vit-D Deficiency, Fractures)  Result Value Ref Range   VITD 48.34 30.00 - 100.00 ng/mL  Phosphorus  Result Value Ref Range   Phosphorus 3.3 2.3 - 4.6 mg/dL      04/01/4741   59:56 AM 01/21/2023    2:18 PM 04/19/2022   10:58 AM 10/11/2020    3:07 PM 03/03/2020    3:44 PM  Depression screen PHQ 2/9  Decreased Interest 2 1 0 3 1  Down, Depressed, Hopeless 1 1 0 2 2  PHQ - 2 Score 3 2 0 5 3  Altered sleeping 1 0  2 3  Tired, decreased energy 0 2  1 2   Change in  appetite 0 2  3 0  Feeling bad or failure about yourself  0 1  1 0  Trouble concentrating 0 1  3  1  Moving slowly or fidgety/restless 0 1  2 1   Suicidal thoughts 0 0  0 0  PHQ-9 Score 4 9  17 10   Difficult doing work/chores Not difficult at all           02/10/2023   11:56 AM 01/21/2023    2:18 PM 10/11/2020    3:08 PM 03/03/2020    3:45 PM  GAD 7 : Generalized Anxiety Score  Nervous, Anxious, on Edge 1 0 2 2  Control/stop worrying 3 0 3 3  Worry too much - different things 3 0 3 2  Trouble relaxing 2 0 1 1  Restless 1 0 3 1  Easily annoyed or irritable 2 0 3 1  Afraid - awful might happen 1 0 0 1  Total GAD 7 Score 13 0 15 11   Assessment & Plan:   Problem List Items Addressed This Visit     Hypothyroidism    Chronic, stable on levothyroxine daily - continue this.       Relevant Medications   levothyroxine (SYNTHROID) 88 MCG tablet   metoprolol succinate (TOPROL-XL) 25 MG 24 hr tablet   Hyperlipidemia    Chronic, stable on current regimen including pravastatin.  The ASCVD Risk score (Arnett DK, et al., 2019) failed to calculate for the following reasons:   The 2019 ASCVD risk score is only valid for ages 97 to 24       Relevant Medications   hydrALAZINE (APRESOLINE) 25 MG tablet   lisinopril (ZESTRIL) 40 MG tablet   metoprolol succinate (TOPROL-XL) 25 MG 24 hr tablet   pravastatin (PRAVACHOL) 40 MG tablet   verapamil (CALAN-SR) 240 MG CR tablet   Hypertension    Chronic, stable - continue current regimen.       Relevant Medications   hydrALAZINE (APRESOLINE) 25 MG tablet   lisinopril (ZESTRIL) 40 MG tablet   metoprolol succinate (TOPROL-XL) 25 MG 24 hr tablet   pravastatin (PRAVACHOL) 40 MG tablet   verapamil (CALAN-SR) 240 MG CR tablet   MDD (major depressive disorder), recurrent episode, moderate (HCC)    Chronic, stable on prozac 40mg  daily.  May be oversleeping - will stop nightly trazodone 25mg .       Relevant Medications   FLUoxetine (PROZAC) 40  MG capsule   History of ulcer disease    H/o this- caution with aricept use.  Continue omeprazole 40mg  daily with pepcid 20mg  nightly.       Anemia    Iron stores trending down - will trial ferrous sulfate MWF, monitoring for constipation GI upset or dark stools.       Relevant Medications   Iron, Ferrous Sulfate, 325 (65 Fe) MG TABS   Medicare annual wellness visit, subsequent - Primary (Chronic)    I have personally reviewed the Medicare Annual Wellness questionnaire and have noted 1. The patient's medical and social history 2. Their use of alcohol, tobacco or illicit drugs 3. Their current medications and supplements 4. The patient's functional ability including ADL's, fall risks, home safety risks and hearing or visual impairment. Cognitive function has been assessed and addressed as indicated.  5. Diet and physical activity 6. Evidence for depression or mood disorders The patients weight, height, BMI have been recorded in the chart. I have made referrals, counseling and provided education to the patient based on review of the above and I have provided the pt with a written personalized care plan for preventive services. Provider list updated.. See scanned questionairre as  needed for further documentation. Reviewed preventative protocols and updated unless pt declined.       CKD (chronic kidney disease) stage 3, GFR 30-59 ml/min (HCC)    Stable with GFR 30s. She drinks significant tea. Encouraged increased water intake.       Advanced care planning/counseling discussion (Chronic)    Advanced directive: scanned into chart 06/2022. HCPOA - daughter [name] then [facility]. Would want full code, ok with feeding tube as long as not brain dead. Does not want prolonged life support if terminal condition. Wants living will followed.       Health maintenance examination (Chronic)    Preventative protocols reviewed and updated unless pt declined. Discussed  healthy diet and lifestyle.       Osteoporosis    Due for prolia- will send Joellen request to get this scheduled.       Urge incontinence    Continue myrbetriq.       Relevant Medications   mirabegron ER (MYRBETRIQ) 25 MG TB24 tablet   Chronic diastolic CHF (congestive heart failure) (HCC)    H/o this, stable on low dose lasix.       Relevant Medications   hydrALAZINE (APRESOLINE) 25 MG tablet   lisinopril (ZESTRIL) 40 MG tablet   metoprolol succinate (TOPROL-XL) 25 MG 24 hr tablet   pravastatin (PRAVACHOL) 40 MG tablet   verapamil (CALAN-SR) 240 MG CR tablet   Secondary hyperparathyroidism of renal origin (HCC)    PTH stable.       Allergic rhinitis   Relevant Medications   loratadine (CLARITIN) 10 MG tablet   Bilateral lower extremity edema    This is despite lasix 20mg  daily - she does note good UOP on this.       Memory deficit    Daughter notes worsening memory difficulty. Noted significantly hard of hearing despite hearing aides. Discussed relation of hearing loss with cognitive awareness.  MMSE 25/30, CDT 2/4 - deterioration from prior. Start aricept 5mg  nightly, watching for GI upset, cardiac conduction abnormalities. Continue PPI + pepcid daily.       GERD (gastroesophageal reflux disease)    Continues omeprazole 40mg  daily.       Relevant Medications   omeprazole (PRILOSEC) 40 MG capsule   Polypharmacy    Stop trazodone.       Other Visit Diagnoses     Encounter for screening mammogram for malignant neoplasm of breast       Relevant Orders   MM 3D SCREENING MAMMOGRAM BILATERAL BREAST        Meds ordered this encounter  Medications   FLUoxetine (PROZAC) 40 MG capsule    Sig: Take 1 capsule (40 mg total) by mouth daily.    Dispense:  90 capsule    Refill:  4   hydrALAZINE (APRESOLINE) 25 MG tablet    Sig: TAKE 1 TABLET IN THE MORNING AND AT BEDTIME.    Dispense:  180 tablet    Refill:  4   levothyroxine (SYNTHROID) 88 MCG tablet    Sig:  TAKE 1 TABLET EVERY DAY BEFORE BREAKFAST    Dispense:  90 tablet    Refill:  4   lisinopril (ZESTRIL) 40 MG tablet    Sig: Take 1 tablet (40 mg total) by mouth daily.    Dispense:  90 tablet    Refill:  4   loratadine (CLARITIN) 10 MG tablet    Sig: Take 1 tablet (10 mg total) by mouth every evening.  Dispense:  90 tablet    Refill:  4   metoprolol succinate (TOPROL-XL) 25 MG 24 hr tablet    Sig: Take 1 tablet (25 mg total) by mouth daily.    Dispense:  90 tablet    Refill:  4   mirabegron ER (MYRBETRIQ) 25 MG TB24 tablet    Sig: Take 1 tablet (25 mg total) by mouth daily.    Dispense:  90 tablet    Refill:  4   omeprazole (PRILOSEC) 40 MG capsule    Sig: Take 1 capsule (40 mg total) by mouth daily.    Dispense:  90 capsule    Refill:  4   pravastatin (PRAVACHOL) 40 MG tablet    Sig: Take 1 tablet (40 mg total) by mouth daily.    Dispense:  90 tablet    Refill:  4   verapamil (CALAN-SR) 240 MG CR tablet    Sig: Take 1 tablet (240 mg total) by mouth daily.    Dispense:  90 tablet    Refill:  4   Iron, Ferrous Sulfate, 325 (65 Fe) MG TABS    Sig: Take 325 mg by mouth every Monday, Wednesday, and Friday.   donepezil (ARICEPT) 5 MG tablet    Sig: Take 1 tablet (5 mg total) by mouth at bedtime. For memory    Dispense:  30 tablet    Refill:  9    Orders Placed This Encounter  Procedures   MM 3D SCREENING MAMMOGRAM BILATERAL BREAST    Standing Status:   Future    Standing Expiration Date:   05/18/2024    Scheduling Instructions:     Please send to [facility]    Order Specific Question:   Reason for Exam (SYMPTOM  OR DIAGNOSIS REQUIRED)    Answer:   breast cancer screening    Order Specific Question:   Preferred imaging location?    Answer:   External    Patient Instructions  Call to schedule mammogram at your convenience: [facility] in [place] [phone].  We will set you up for repeat prolia shot.  Start oral iron (ferrous sulfate 325mg  65FE) 1 tablet 3 days  a week.  Increase water, cut down on sweet iced tea.  Stop trazodone Trial aricept (donepezil) 5mg  nightly memory medicine.  Return in 3-4 months for follow up visit.   Follow up plan: Return in about 3 months (around 08/19/2023) for follow up visit.  Eustaquio Boyden, MD

## 2023-05-19 NOTE — Assessment & Plan Note (Signed)
PTH stable. 

## 2023-05-19 NOTE — Assessment & Plan Note (Signed)
Chronic, stable on prozac 40mg  daily.  May be oversleeping - will stop nightly trazodone 25mg .

## 2023-05-19 NOTE — Assessment & Plan Note (Signed)
Continue myrbetriq 

## 2023-05-19 NOTE — Assessment & Plan Note (Signed)
Iron stores trending down - will trial ferrous sulfate MWF, monitoring for constipation GI upset or dark stools.

## 2023-05-19 NOTE — Assessment & Plan Note (Signed)
Continues omeprazole 40mg daily.  

## 2023-05-19 NOTE — Assessment & Plan Note (Signed)
Advanced directive: scanned into chart 06/2022. HCPOA - daughter Kai Levins then [facility]. Would want full code, ok with feeding tube as long as not brain dead. Does not want prolonged life support if terminal condition. Wants living will followed.

## 2023-05-19 NOTE — Assessment & Plan Note (Signed)
H/o this- caution with aricept use.  Continue omeprazole 40mg  daily with pepcid 20mg  nightly.

## 2023-05-19 NOTE — Patient Instructions (Addendum)
Call to schedule mammogram at your convenience: [facility] in [place] [phone].  We will set you up for repeat prolia shot.  Start oral iron (ferrous sulfate 325mg  65FE) 1 tablet 3 days a week.  Increase water, cut down on sweet iced tea.  Stop trazodone Trial aricept (donepezil) 5mg  nightly memory medicine.  Return in 3-4 months for follow up visit.

## 2023-05-20 NOTE — Telephone Encounter (Signed)
Have we received benefits for this patient?

## 2023-05-26 ENCOUNTER — Encounter: Payer: Self-pay | Admitting: Family Medicine

## 2023-05-26 DIAGNOSIS — R413 Other amnesia: Secondary | ICD-10-CM

## 2023-05-26 DIAGNOSIS — I5032 Chronic diastolic (congestive) heart failure: Secondary | ICD-10-CM

## 2023-05-26 DIAGNOSIS — Z79899 Other long term (current) drug therapy: Secondary | ICD-10-CM

## 2023-05-26 DIAGNOSIS — R0609 Other forms of dyspnea: Secondary | ICD-10-CM

## 2023-05-27 ENCOUNTER — Other Ambulatory Visit (HOSPITAL_COMMUNITY): Payer: Self-pay

## 2023-05-27 NOTE — Telephone Encounter (Signed)
Pt ready for scheduling for PROLIA on or after : 05/27/23  Out-of-pocket cost due at time of visit: $327  Primary: [employer] Prolia co-insurance: 20% Admin fee co-insurance: 20%  Secondary: --- Prolia co-insurance:  Admin fee co-insurance:   Medical Benefit Details: Date Benefits were checked: 04/22/23 Deductible: NO/ Coinsurance: 20%/ Admin Fee: 20%  Prior Auth: APPROVED PA# [id number] Expiration Date: 12/30/23   Pharmacy benefit: Copay $95 If patient wants fill through the pharmacy benefit please send prescription to: [employer], and include estimated need by date in rx notes. Pharmacy will ship medication directly to the office.  Patient NOT eligible for Prolia Copay Card. Copay Card can make patient's cost as little as $25. Link to apply: [email or web address]://[email or web address]  ** This summary of benefits is an estimation of the patient's out-of-pocket cost. Exact cost may very based on individual plan coverage.

## 2023-05-27 NOTE — Telephone Encounter (Signed)
Benefits completed. Routed separate encounter to you.

## 2023-05-27 NOTE — Telephone Encounter (Signed)
Patient Advocate Encounter  Prior Authorization for Prolia 60mg  has been approved with [employer].    PA# [id number] Effective dates: 02/21/21 through 12/30/23

## 2023-05-30 ENCOUNTER — Other Ambulatory Visit: Payer: Self-pay | Admitting: Family Medicine

## 2023-05-30 MED ORDER — DENOSUMAB 60 MG/ML ~~LOC~~ SOSY: 60.0000 mg | PREFILLED_SYRINGE | SUBCUTANEOUS | 0 refills | Status: AC

## 2023-05-30 NOTE — Telephone Encounter (Signed)
Called patient reviewed all following information including appointment, Co pay due at time of visit and if pick up of injection is needed from outside pharmacy.     Out of pocket for patient: $95 if sent to pharmacy  Lab appointment : 05/12/23  Nurse visit:  06/10/23  Lab order placed: No has been done in last 30 days   Prolia has been  []   Ordered  []   Script sent to local pharmacy for patient to bring   [x]   Script sent to Specialty pharmacy   []   Script sent to [facility] to deliver

## 2023-06-03 ENCOUNTER — Ambulatory Visit (INDEPENDENT_AMBULATORY_CARE_PROVIDER_SITE_OTHER): Payer: Medicare HMO | Admitting: Podiatry

## 2023-06-03 ENCOUNTER — Encounter: Payer: Self-pay | Admitting: Podiatry

## 2023-06-03 VITALS — BP 140/81 | HR 66

## 2023-06-03 DIAGNOSIS — M79674 Pain in right toe(s): Secondary | ICD-10-CM

## 2023-06-03 DIAGNOSIS — N1832 Chronic kidney disease, stage 3b: Secondary | ICD-10-CM | POA: Diagnosis not present

## 2023-06-03 DIAGNOSIS — M79675 Pain in left toe(s): Secondary | ICD-10-CM | POA: Diagnosis not present

## 2023-06-03 DIAGNOSIS — B351 Tinea unguium: Secondary | ICD-10-CM

## 2023-06-03 NOTE — Progress Notes (Signed)
This patient returns to my office for at risk foot care.  This patient requires this care by a professional since this patient will be at risk due to having  CKD.  Patient presents to the office with her daughter.  This patient is unable to cut nails herself since the patient cannot reach hiernails.These nails are painful walking and wearing shoes.  This patient presents for at risk foot care today.  General Appearance  Alert, conversant and in no acute stress.  Vascular  Dorsalis pedis and posterior tibial  pulses are weakly  palpable  bilaterally.  Capillary return is within normal limits  bilaterally. Temperature is within normal limits  bilaterally.  Neurologic  Senn-Weinstein monofilament wire test within normal limits  bilaterally. Muscle power within normal limits bilaterally.  Nails Thick disfigured discolored nails with subungual debris  from hallux to fifth toes bilaterally. No evidence of bacterial infection or drainage bilaterally.  Orthopedic  No limitations of motion  feet .  No crepitus or effusions noted.  No bony pathology or digital deformities noted.  Skin  normotropic skin with no porokeratosis noted bilaterally.  No signs of infections or ulcers noted.     Onychomycosis  Pain in right toes  Pain in left toes  Consent was obtained for treatment procedures.   Mechanical debridement of nails 1-5  bilaterally performed with a nail nipper.  Filed with dremel without incident.    Return office visit    3 months                  Told patient to return for periodic foot care and evaluation due to potential at risk complications.   Devonne Kitchen DPM   

## 2023-06-05 ENCOUNTER — Telehealth: Payer: Self-pay | Admitting: Family Medicine

## 2023-06-05 NOTE — Telephone Encounter (Signed)
[facility] called attempting to schedule the pt's prolia meds. Call back # [phone].

## 2023-06-06 ENCOUNTER — Telehealth: Payer: Self-pay | Admitting: Family Medicine

## 2023-06-06 NOTE — Telephone Encounter (Signed)
See recent [employer].  I placed HH order yesterday - will forward to referral team.

## 2023-06-06 NOTE — Telephone Encounter (Signed)
[facility] called in requesting a call back regarding pt RX denosumab (PROLIA) 60 MG/ML SOSY injection  . [phone]

## 2023-06-06 NOTE — Telephone Encounter (Signed)
Delivery scheduled for 6/11 with signature required.   Daughter called as well wanted to see if Dr. Sharen Hones could start order for home health. Patient is having issues with daily task. Informed daughter that I will send message for review but we may need office visit to submit order. She is aware that we will call to set up if visit needed.

## 2023-06-06 NOTE — Telephone Encounter (Signed)
Duplicate message see other phone not for documentation.

## 2023-06-09 NOTE — Telephone Encounter (Signed)
Sent to [facility] to see if they will accept

## 2023-06-10 ENCOUNTER — Ambulatory Visit (INDEPENDENT_AMBULATORY_CARE_PROVIDER_SITE_OTHER): Payer: Medicare HMO

## 2023-06-10 DIAGNOSIS — M810 Age-related osteoporosis without current pathological fracture: Secondary | ICD-10-CM

## 2023-06-10 MED ORDER — DENOSUMAB 60 MG/ML ~~LOC~~ SOSY
60.0000 mg | PREFILLED_SYRINGE | Freq: Once | SUBCUTANEOUS | Status: AC
Start: 2023-06-10 — End: 2023-06-10
  Administered 2023-06-10: 60 mg via SUBCUTANEOUS

## 2023-06-10 NOTE — Telephone Encounter (Signed)
Spoke to patient granddaughter while here for Prolia. States she is not sure patient is taking any of her medications right. That is the main reason she is wanting to get an aid for patient. She has not seen any improvement in symptoms.

## 2023-06-10 NOTE — Progress Notes (Signed)
Per orders of Dr. Eustaquio Boyden, injection of Prolia  given by Donnamarie Poag in left arm. Patient tolerated injection well.

## 2023-06-15 ENCOUNTER — Encounter (HOSPITAL_COMMUNITY): Payer: Self-pay

## 2023-06-15 ENCOUNTER — Emergency Department (HOSPITAL_COMMUNITY)
Admission: EM | Admit: 2023-06-15 | Discharge: 2023-06-15 | Disposition: A | Discharge status: Home / Self Care | Payer: Medicare HMO | Attending: Emergency Medicine | Admitting: Emergency Medicine

## 2023-06-15 ENCOUNTER — Emergency Department (HOSPITAL_COMMUNITY): Payer: Medicare HMO

## 2023-06-15 ENCOUNTER — Emergency Department (HOSPITAL_BASED_OUTPATIENT_CLINIC_OR_DEPARTMENT_OTHER): Payer: Medicare HMO

## 2023-06-15 DIAGNOSIS — Z79899 Other long term (current) drug therapy: Secondary | ICD-10-CM | POA: Diagnosis not present

## 2023-06-15 DIAGNOSIS — R609 Edema, unspecified: Secondary | ICD-10-CM | POA: Diagnosis not present

## 2023-06-15 DIAGNOSIS — R0602 Shortness of breath: Secondary | ICD-10-CM | POA: Diagnosis not present

## 2023-06-15 DIAGNOSIS — I10 Essential (primary) hypertension: Secondary | ICD-10-CM | POA: Diagnosis not present

## 2023-06-15 DIAGNOSIS — R52 Pain, unspecified: Secondary | ICD-10-CM | POA: Diagnosis not present

## 2023-06-15 DIAGNOSIS — I110 Hypertensive heart disease with heart failure: Secondary | ICD-10-CM | POA: Insufficient documentation

## 2023-06-15 DIAGNOSIS — R600 Localized edema: Secondary | ICD-10-CM

## 2023-06-15 DIAGNOSIS — M7989 Other specified soft tissue disorders: Secondary | ICD-10-CM

## 2023-06-15 DIAGNOSIS — I509 Heart failure, unspecified: Secondary | ICD-10-CM | POA: Diagnosis not present

## 2023-06-15 DIAGNOSIS — R2241 Localized swelling, mass and lump, right lower limb: Secondary | ICD-10-CM | POA: Diagnosis present

## 2023-06-15 LAB — BASIC METABOLIC PANEL
Anion gap: 6 (ref 5–15)
BUN: 21 mg/dL (ref 8–23)
CO2: 26 mmol/L (ref 22–32)
Calcium: 8.7 mg/dL — ABNORMAL LOW (ref 8.9–10.3)
Chloride: 110 mmol/L (ref 98–111)
Creatinine, Ser: 1.28 mg/dL — ABNORMAL HIGH (ref 0.44–1.00)
GFR, Estimated: 40 mL/min — ABNORMAL LOW (ref >60)
Glucose, Bld: 99 mg/dL (ref 70–99)
Potassium: 4 mmol/L (ref 3.5–5.1)
Sodium: 142 mmol/L (ref 135–145)

## 2023-06-15 LAB — CBC
HCT: 36.7 % (ref 36.0–46.0)
Hemoglobin: 11 g/dL — ABNORMAL LOW (ref 12.0–15.0)
MCH: 28.2 pg (ref 26.0–34.0)
MCHC: 30 g/dL (ref 30.0–36.0)
MCV: 94.1 fL (ref 80.0–100.0)
Platelets: 199 10*3/uL (ref 150–400)
RBC: 3.9 MIL/uL (ref 3.87–5.11)
RDW: 14.6 % (ref 11.5–15.5)
WBC: 7.2 10*3/uL (ref 4.0–10.5)
nRBC: 0 % (ref 0.0–0.2)

## 2023-06-15 LAB — BRAIN NATRIURETIC PEPTIDE: B Natriuretic Peptide: 380.6 pg/mL — ABNORMAL HIGH (ref 0.0–100.0)

## 2023-06-15 MED ORDER — FUROSEMIDE 40 MG PO TABS
40.0000 mg | ORAL_TABLET | Freq: Once | ORAL | Status: AC
  Administered 2023-06-15: 40 mg via ORAL
  Filled 2023-06-15: qty 1

## 2023-06-15 NOTE — ED Triage Notes (Signed)
Pt arrived via [facility], right sided calf pain and swelling.  180/80 74 HR 97% RA 115 CBG

## 2023-06-15 NOTE — Discharge Instructions (Signed)
Take an extra dose of your Lasix for the next 3 days follow-up with your primary care doctor

## 2023-06-15 NOTE — ED Provider Notes (Signed)
[place] EMERGENCY DEPARTMENT AT [facility] Provider Note   CSN: [id number] Arrival date & time: 06/15/23  1403     History  Chief Complaint  Patient presents with   Leg Swelling    KIEU SANKEY is a 89 y.o. female with past medical history seen for hypertension, hyperlipidemia, arthritis, CHF, chronic swelling of lower extremities with concern for worsening swelling of right-sided calf with associated pain.  Patient endorses some shortness of breath.  Blood pressure elevated 180/80 on arrival.  Patient reports that she is taking all of her medications as prescribed.  She denies any chest pain.  She denies any fever, chills, abdominal pain.  She reports no significant leg pain at rest but reports worsening pain with walking.  No previous history of blood clots, she does not take any blood thinners.  HPI     Home Medications Prior to Admission medications   Medication Sig Start Date End Date Taking? Authorizing Provider  acetaminophen (TYLENOL) 500 MG tablet Take 1,000 mg by mouth See admin instructions. Take 1,000 mg by mouth in the morning and an additional 1,000 mg once a day as needed for pain   Yes [provider]  albuterol (VENTOLIN HFA) 108 (90 Base) MCG/ACT inhaler Inhale 1 - 2 puffs into the lungs every 6 hours as needed for wheezing or shortness of breath. 04/02/22  Yes Eustaquio Boyden, MD  Ascorbic Acid (VITAMIN C PO) Take 1 tablet by mouth daily with breakfast.   Yes [provider]  calcium gluconate 500 MG tablet Take 1 tablet (500 mg total) by mouth daily. 04/14/21  Yes Eustaquio Boyden, MD  cholecalciferol (VITAMIN D) 1000 UNITS tablet Take 1,000 Units by mouth daily.   Yes [provider]  denosumab (PROLIA) 60 MG/ML SOSY injection Inject 60 mg into the skin every 6 (six) months. Patient scheduled for 06/11/23 Patient taking differently: Inject 60 mg into the skin every 6 (six) months. 05/30/23  Yes Eustaquio Boyden, MD   diphenhydramine-acetaminophen (TYLENOL PM) 25-500 MG TABS tablet Take 1 tablet by mouth at bedtime as needed (sleep). Patient taking differently: Take 1 tablet by mouth at bedtime. 04/12/22  Yes Eustaquio Boyden, MD  docusate sodium (COLACE) 100 MG capsule Take 100 mg by mouth 2 (two) times daily as needed for mild constipation.   Yes [provider]  donepezil (ARICEPT) 5 MG tablet Take 1 tablet (5 mg total) by mouth at bedtime. For memory 05/19/23  Yes Eustaquio Boyden, MD  famotidine (PEPCID) 20 MG tablet Take 20 mg by mouth daily.   Yes [provider]  FLUoxetine (PROZAC) 40 MG capsule Take 1 capsule (40 mg total) by mouth daily. 05/19/23  Yes Eustaquio Boyden, MD  furosemide (LASIX) 20 MG tablet Take 1 tablet (20 mg total) by mouth daily. 02/10/23  Yes Eustaquio Boyden, MD  hydrALAZINE (APRESOLINE) 25 MG tablet TAKE 1 TABLET IN THE MORNING AND AT BEDTIME. Patient taking differently: Take 25 mg by mouth in the morning and at bedtime. 05/19/23  Yes Eustaquio Boyden, MD  Iron, Ferrous Sulfate, 325 (65 Fe) MG TABS Take 325 mg by mouth every Monday, Wednesday, and Friday. 05/19/23  Yes Eustaquio Boyden, MD  levothyroxine (SYNTHROID) 88 MCG tablet TAKE 1 TABLET EVERY DAY BEFORE BREAKFAST Patient taking differently: Take 88 mcg by mouth daily before breakfast. 05/19/23  Yes Eustaquio Boyden, MD  lisinopril (ZESTRIL) 40 MG tablet Take 1 tablet (40 mg total) by mouth daily. Patient taking differently: Take 40 mg by mouth  in the morning. 05/19/23  Yes Eustaquio Boyden, MD  loratadine (CLARITIN) 10 MG tablet Take 1 tablet (10 mg total) by mouth every evening. 05/19/23  Yes Eustaquio Boyden, MD  metoprolol succinate (TOPROL-XL) 25 MG 24 hr tablet Take 1 tablet (25 mg total) by mouth daily. Patient taking differently: Take 25 mg by mouth at bedtime. 05/19/23  Yes Eustaquio Boyden, MD  mirabegron ER (MYRBETRIQ) 25 MG TB24 tablet Take 1 tablet (25 mg total) by mouth daily. Patient taking  differently: Take 25 mg by mouth at bedtime. 05/19/23  Yes Eustaquio Boyden, MD  omeprazole (PRILOSEC) 40 MG capsule Take 1 capsule (40 mg total) by mouth daily. Patient taking differently: Take 40 mg by mouth daily before breakfast. 05/19/23  Yes Eustaquio Boyden, MD  pravastatin (PRAVACHOL) 40 MG tablet Take 1 tablet (40 mg total) by mouth daily. Patient taking differently: Take 40 mg by mouth at bedtime. 05/19/23  Yes Eustaquio Boyden, MD  Propylene Glycol (SYSTANE BALANCE OP) Place 1 drop into both eyes 3 (three) times daily as needed (for dryness).   Yes [provider]  verapamil (CALAN-SR) 240 MG CR tablet Take 1 tablet (240 mg total) by mouth daily. Patient taking differently: Take 240 mg by mouth at bedtime. 05/19/23  Yes Eustaquio Boyden, MD  vitamin B-12 (CYANOCOBALAMIN) 500 MCG tablet Take 1 tablet (500 mcg total) by mouth once a week. Patient taking differently: Take 250 mcg by mouth every Thursday. 03/08/20  Yes Eustaquio Boyden, MD  polyethylene glycol (MIRALAX / GLYCOLAX) 17 g packet Take 17 g by mouth daily as needed for mild constipation. Patient not taking: Reported on 06/15/2023 12/18/21   Darlin Drop, DO      Allergies    Trazodone and nefazodone    Review of Systems   Review of Systems  All other systems reviewed and are negative.   Physical Exam Updated Vital Signs BP (!) 180/62   Pulse 71   Temp 98.4 F (36.9 C) (Oral)   Resp 16   SpO2 97%  Physical Exam Vitals and nursing note reviewed.  Constitutional:      General: She is not in acute distress.    Appearance: Normal appearance.  HENT:     Head: Normocephalic and atraumatic.  Eyes:     General:        Right eye: No discharge.        Left eye: No discharge.  Cardiovascular:     Rate and Rhythm: Normal rate and regular rhythm.     Heart sounds: No murmur heard.    No friction rub. No gallop.  Pulmonary:     Effort: Pulmonary effort is normal.     Breath sounds: Normal breath sounds.   Abdominal:     General: Bowel sounds are normal.     Palpations: Abdomen is soft.  Musculoskeletal:     Comments: 2-3+ pitting edema with redness of venous stasis bilaterally. I do not appreciate significant unilateral size discrepancy  Skin:    General: Skin is warm and dry.     Capillary Refill: Capillary refill takes less than 2 seconds.  Neurological:     Mental Status: She is alert and oriented to person, place, and time.  Psychiatric:        Mood and Affect: Mood normal.        Behavior: Behavior normal.     ED Results / Procedures / Treatments   Labs (all labs ordered are listed, but only abnormal results are displayed)  Labs Reviewed  CBC - Abnormal; Notable for the following components:      Result Value   Hemoglobin 11.0 (*)    All other components within normal limits  BASIC METABOLIC PANEL - Abnormal; Notable for the following components:   Creatinine, Ser 1.28 (*)    Calcium 8.7 (*)    GFR, Estimated 40 (*)    All other components within normal limits  BRAIN NATRIURETIC PEPTIDE - Abnormal; Notable for the following components:   B Natriuretic Peptide 380.6 (*)    All other components within normal limits    EKG None  Radiology VAS [place] LOWER EXTREMITY VENOUS (DVT) (7a-7p)  Result Date: 06/15/2023  Lower Venous DVT Study Patient Name:  BETTIJANE EHRESMANN  Date of Exam:   06/15/2023 Medical Rec #: [id number]       Accession #:    [id number] Date of Birth: February 12, 1930        Patient Gender: F Patient Age:   70 years Exam Location:  [facility] Procedure:      VAS [place] LOWER EXTREMITY VENOUS (DVT) Referring Phys: Marlie Kuennen --------------------------------------------------------------------------------  Indications: Bilateral pain/swelling.  Comparison Study: No prior studies. Performing Technologist: Jean Rosenthal RDMS, RVT  Examination Guidelines: A complete evaluation includes B-mode imaging, spectral Doppler, color Doppler, and power Doppler as needed of all  accessible portions of each vessel. Bilateral testing is considered an integral part of a complete examination. Limited examinations for reoccurring indications may be performed as noted. The reflux portion of the exam is performed with the patient in reverse Trendelenburg.  +---------+---------------+---------+-----------+----------+--------------+ RIGHT    CompressibilityPhasicitySpontaneityPropertiesThrombus Aging +---------+---------------+---------+-----------+----------+--------------+ CFV      Full           Yes      Yes                                 +---------+---------------+---------+-----------+----------+--------------+ SFJ      Full                                                        +---------+---------------+---------+-----------+----------+--------------+ FV Prox  Full                                                        +---------+---------------+---------+-----------+----------+--------------+ FV Mid   Full                                                        +---------+---------------+---------+-----------+----------+--------------+ FV DistalFull                                                        +---------+---------------+---------+-----------+----------+--------------+ PFV      Full                                                        +---------+---------------+---------+-----------+----------+--------------+  POP      Full           Yes      Yes                                 +---------+---------------+---------+-----------+----------+--------------+ PTV      Full                                                        +---------+---------------+---------+-----------+----------+--------------+ PERO     Full                                                        +---------+---------------+---------+-----------+----------+--------------+   +---------+---------------+---------+-----------+----------+--------------+  LEFT     CompressibilityPhasicitySpontaneityPropertiesThrombus Aging +---------+---------------+---------+-----------+----------+--------------+ CFV      Full           Yes      Yes                                 +---------+---------------+---------+-----------+----------+--------------+ SFJ      Full                                                        +---------+---------------+---------+-----------+----------+--------------+ FV Prox  Full                                                        +---------+---------------+---------+-----------+----------+--------------+ FV Mid   Full                                                        +---------+---------------+---------+-----------+----------+--------------+ FV DistalFull                                                        +---------+---------------+---------+-----------+----------+--------------+ PFV      Full                                                        +---------+---------------+---------+-----------+----------+--------------+ POP      Full           Yes      Yes                                 +---------+---------------+---------+-----------+----------+--------------+  PTV      Full                                                        +---------+---------------+---------+-----------+----------+--------------+ PERO     Full                                                        +---------+---------------+---------+-----------+----------+--------------+     Summary: RIGHT: - There is no evidence of deep vein thrombosis in the lower extremity.  - No cystic structure found in the popliteal fossa.  LEFT: - There is no evidence of deep vein thrombosis in the lower extremity.  - No cystic structure found in the popliteal fossa.  *See table(s) above for measurements and observations.    Preliminary    DG Chest 2 View  Result Date: 06/15/2023 CLINICAL DATA:  Shortness of  breath. Right lower extremity swelling. EXAM: CHEST - 2 VIEW COMPARISON:  12/14/2021 FINDINGS: Stable moderate cardiomegaly and ectasia of the thoracic aorta. Both lungs are clear. Internal fixation hardware is seen in the proximal right humerus. IMPRESSION: Stable cardiomegaly. No active lung disease. Electronically Signed   By: Danae Orleans M.D.   On: 06/15/2023 14:58    Procedures Procedures    Medications Ordered in ED Medications  furosemide (LASIX) tablet 40 mg (40 mg Oral Given 06/15/23 1644)    ED Course/ Medical Decision Making/ A&P                             Medical Decision Making  This patient is a 89 y.o. female  who presents to the ED for concern of leg swelling.   Differential diagnoses prior to evaluation: The emergent differential diagnosis includes, but is not limited to, lymphedema, heart failure exacerbation, kidney failure, liver failure, DVT, versus other. This is not an exhaustive differential.   Past Medical History / Co-morbidities / Social History: hypertension, hyperlipidemia, arthritis, CHF, chronic swelling of lower extremities  Physical Exam: Physical exam performed. The pertinent findings include: 2-3+ pitting edema with redness of venous stasis bilaterally. I do not appreciate significant unilateral size discrepancy  Lab Tests/Imaging studies: I personally interpreted labs/imaging and the pertinent results include:  BNP mildly elevated suggestive of heart failure exacerbation as a contributing factor for patient's bilateral lower extremity edema.  BMP unremarkable compared to baseline, CBC unremarkable compared to baseline.  [place] interpreted vascular ultrasound of bilateral lower extremities which shows no evidence of acute DVT.  Plain, chest x-ray with no evidence of pneumonia or other intrathoracic abnormality..I agree with the radiologist interpretation.   Medications: I ordered medication including Lasix for fluid on legs.  I have reviewed the  patients home medicines and have made adjustments as needed.   Disposition: After consideration of the diagnostic results and the patients response to treatment, I feel that patient is stable for discharge with plan to increase her Lasix for the next 3 days, and follow-up with her PCP for recheck.   emergency department workup does not suggest an emergent condition requiring admission or immediate intervention beyond what has been performed at this time. The  plan is: as above. The patient is safe for discharge and has been instructed to return immediately for worsening symptoms, change in symptoms or any other concerns.  Final Clinical Impression(s) / ED Diagnoses Final diagnoses:  Peripheral edema    Rx / DC Orders ED Discharge Orders     None         [place] 06/15/23 1736    Terald Sleeper, MD 06/15/23 1801

## 2023-06-15 NOTE — Progress Notes (Signed)
Lower extremity venous bilateral study completed.  Preliminary results relayed to [place].   See CV Proc for preliminary results report.   Jean Rosenthal, RDMS, RVT

## 2023-06-19 ENCOUNTER — Telehealth: Payer: Self-pay

## 2023-06-19 DIAGNOSIS — D631 Anemia in chronic kidney disease: Secondary | ICD-10-CM | POA: Diagnosis not present

## 2023-06-19 DIAGNOSIS — E039 Hypothyroidism, unspecified: Secondary | ICD-10-CM | POA: Diagnosis not present

## 2023-06-19 DIAGNOSIS — I130 Hypertensive heart and chronic kidney disease with heart failure and stage 1 through stage 4 chronic kidney disease, or unspecified chronic kidney disease: Secondary | ICD-10-CM | POA: Diagnosis not present

## 2023-06-19 DIAGNOSIS — N3941 Urge incontinence: Secondary | ICD-10-CM | POA: Diagnosis not present

## 2023-06-19 DIAGNOSIS — M810 Age-related osteoporosis without current pathological fracture: Secondary | ICD-10-CM | POA: Diagnosis not present

## 2023-06-19 DIAGNOSIS — I5032 Chronic diastolic (congestive) heart failure: Secondary | ICD-10-CM | POA: Diagnosis not present

## 2023-06-19 DIAGNOSIS — F331 Major depressive disorder, recurrent, moderate: Secondary | ICD-10-CM | POA: Diagnosis not present

## 2023-06-19 DIAGNOSIS — N183 Chronic kidney disease, stage 3 unspecified: Secondary | ICD-10-CM | POA: Diagnosis not present

## 2023-06-19 DIAGNOSIS — I890 Lymphedema, not elsewhere classified: Secondary | ICD-10-CM | POA: Diagnosis not present

## 2023-06-19 NOTE — Telephone Encounter (Signed)
Transition Care Management Unsuccessful Follow-up Telephone Call  Date of discharge and from where:  [name]  Attempts:  1st Attempt  Reason for unsuccessful TCM follow-up call:  Left voice message   [name] [facility] Guide, [facility] [phone] [address] [place], [place], [place] [address] Phone: [phone] Email: [email or web address]

## 2023-06-20 ENCOUNTER — Telehealth: Payer: Self-pay | Admitting: Family Medicine

## 2023-06-20 ENCOUNTER — Telehealth: Payer: Self-pay

## 2023-06-20 NOTE — Telephone Encounter (Signed)
Transition Care Management Follow-up Telephone Call Date of discharge and from where: [name] How have you been since you were released from the hospital? Doing ok but still confused and mixing up her medication patient has not had a follow up Any questions or concerns? No  Items Reviewed: Did the pt receive and understand the discharge instructions provided? Yes  Medications obtained and verified? Yes  Other? No  Any new allergies since your discharge? No  Dietary orders reviewed? No Do you have support at home? No     Follow up appointments reviewed:  PCP Hospital f/u appt confirmed? No  Scheduled to see  on  @ . Specialist Hospital f/u appt confirmed? No  Scheduled to see  on  @ . Are transportation arrangements needed? No  If their condition worsens, is the pt aware to call PCP or go to the Emergency Dept.? Yes Was the patient provided with contact information for the PCP's office or ED? Yes Was to pt encouraged to call back with questions or concerns? Yes

## 2023-06-20 NOTE — Telephone Encounter (Signed)
Spoke with Dois Davenport of [facility] verballly providing current med list.

## 2023-06-20 NOTE — Telephone Encounter (Signed)
Dois Davenport from [facility] called stating that she needs an updated medication list on patient.She stated that she just started working with patient and that her daughter is not sure of what medication she is suppose to be taking and that she has pill bottles all over the place.

## 2023-06-24 DIAGNOSIS — K219 Gastro-esophageal reflux disease without esophagitis: Secondary | ICD-10-CM

## 2023-06-24 DIAGNOSIS — I5032 Chronic diastolic (congestive) heart failure: Secondary | ICD-10-CM | POA: Diagnosis not present

## 2023-06-24 DIAGNOSIS — E039 Hypothyroidism, unspecified: Secondary | ICD-10-CM | POA: Diagnosis not present

## 2023-06-24 DIAGNOSIS — E785 Hyperlipidemia, unspecified: Secondary | ICD-10-CM

## 2023-06-24 DIAGNOSIS — Z8616 Personal history of COVID-19: Secondary | ICD-10-CM

## 2023-06-24 DIAGNOSIS — J309 Allergic rhinitis, unspecified: Secondary | ICD-10-CM

## 2023-06-24 DIAGNOSIS — M810 Age-related osteoporosis without current pathological fracture: Secondary | ICD-10-CM | POA: Diagnosis not present

## 2023-06-24 DIAGNOSIS — Z9181 History of falling: Secondary | ICD-10-CM

## 2023-06-24 DIAGNOSIS — I130 Hypertensive heart and chronic kidney disease with heart failure and stage 1 through stage 4 chronic kidney disease, or unspecified chronic kidney disease: Secondary | ICD-10-CM | POA: Diagnosis not present

## 2023-06-24 DIAGNOSIS — Z556 Problems related to health literacy: Secondary | ICD-10-CM

## 2023-06-24 DIAGNOSIS — N3941 Urge incontinence: Secondary | ICD-10-CM | POA: Diagnosis not present

## 2023-06-24 DIAGNOSIS — Z974 Presence of external hearing-aid: Secondary | ICD-10-CM

## 2023-06-24 DIAGNOSIS — I890 Lymphedema, not elsewhere classified: Secondary | ICD-10-CM | POA: Diagnosis not present

## 2023-06-24 DIAGNOSIS — F331 Major depressive disorder, recurrent, moderate: Secondary | ICD-10-CM | POA: Diagnosis not present

## 2023-06-24 DIAGNOSIS — D631 Anemia in chronic kidney disease: Secondary | ICD-10-CM | POA: Diagnosis not present

## 2023-06-24 DIAGNOSIS — N183 Chronic kidney disease, stage 3 unspecified: Secondary | ICD-10-CM | POA: Diagnosis not present

## 2023-06-24 DIAGNOSIS — N2581 Secondary hyperparathyroidism of renal origin: Secondary | ICD-10-CM

## 2023-06-25 DIAGNOSIS — E039 Hypothyroidism, unspecified: Secondary | ICD-10-CM | POA: Diagnosis not present

## 2023-06-25 DIAGNOSIS — I5032 Chronic diastolic (congestive) heart failure: Secondary | ICD-10-CM | POA: Diagnosis not present

## 2023-06-25 DIAGNOSIS — F331 Major depressive disorder, recurrent, moderate: Secondary | ICD-10-CM | POA: Diagnosis not present

## 2023-06-25 DIAGNOSIS — I130 Hypertensive heart and chronic kidney disease with heart failure and stage 1 through stage 4 chronic kidney disease, or unspecified chronic kidney disease: Secondary | ICD-10-CM | POA: Diagnosis not present

## 2023-06-25 DIAGNOSIS — I890 Lymphedema, not elsewhere classified: Secondary | ICD-10-CM | POA: Diagnosis not present

## 2023-06-25 DIAGNOSIS — M810 Age-related osteoporosis without current pathological fracture: Secondary | ICD-10-CM | POA: Diagnosis not present

## 2023-06-25 DIAGNOSIS — N183 Chronic kidney disease, stage 3 unspecified: Secondary | ICD-10-CM | POA: Diagnosis not present

## 2023-06-25 DIAGNOSIS — N3941 Urge incontinence: Secondary | ICD-10-CM | POA: Diagnosis not present

## 2023-06-25 DIAGNOSIS — D631 Anemia in chronic kidney disease: Secondary | ICD-10-CM | POA: Diagnosis not present

## 2023-06-26 DIAGNOSIS — D631 Anemia in chronic kidney disease: Secondary | ICD-10-CM | POA: Diagnosis not present

## 2023-06-26 DIAGNOSIS — I5032 Chronic diastolic (congestive) heart failure: Secondary | ICD-10-CM | POA: Diagnosis not present

## 2023-06-26 DIAGNOSIS — I130 Hypertensive heart and chronic kidney disease with heart failure and stage 1 through stage 4 chronic kidney disease, or unspecified chronic kidney disease: Secondary | ICD-10-CM | POA: Diagnosis not present

## 2023-06-26 DIAGNOSIS — E039 Hypothyroidism, unspecified: Secondary | ICD-10-CM | POA: Diagnosis not present

## 2023-06-26 DIAGNOSIS — M810 Age-related osteoporosis without current pathological fracture: Secondary | ICD-10-CM | POA: Diagnosis not present

## 2023-06-26 DIAGNOSIS — N183 Chronic kidney disease, stage 3 unspecified: Secondary | ICD-10-CM | POA: Diagnosis not present

## 2023-06-26 DIAGNOSIS — N3941 Urge incontinence: Secondary | ICD-10-CM | POA: Diagnosis not present

## 2023-06-26 DIAGNOSIS — F331 Major depressive disorder, recurrent, moderate: Secondary | ICD-10-CM | POA: Diagnosis not present

## 2023-06-26 DIAGNOSIS — I890 Lymphedema, not elsewhere classified: Secondary | ICD-10-CM | POA: Diagnosis not present

## 2023-06-27 DIAGNOSIS — I130 Hypertensive heart and chronic kidney disease with heart failure and stage 1 through stage 4 chronic kidney disease, or unspecified chronic kidney disease: Secondary | ICD-10-CM | POA: Diagnosis not present

## 2023-06-27 DIAGNOSIS — I5032 Chronic diastolic (congestive) heart failure: Secondary | ICD-10-CM | POA: Diagnosis not present

## 2023-06-27 DIAGNOSIS — F331 Major depressive disorder, recurrent, moderate: Secondary | ICD-10-CM | POA: Diagnosis not present

## 2023-06-27 DIAGNOSIS — N3941 Urge incontinence: Secondary | ICD-10-CM | POA: Diagnosis not present

## 2023-06-27 DIAGNOSIS — I890 Lymphedema, not elsewhere classified: Secondary | ICD-10-CM | POA: Diagnosis not present

## 2023-06-27 DIAGNOSIS — D631 Anemia in chronic kidney disease: Secondary | ICD-10-CM | POA: Diagnosis not present

## 2023-06-27 DIAGNOSIS — M810 Age-related osteoporosis without current pathological fracture: Secondary | ICD-10-CM | POA: Diagnosis not present

## 2023-06-27 DIAGNOSIS — E039 Hypothyroidism, unspecified: Secondary | ICD-10-CM | POA: Diagnosis not present

## 2023-06-27 DIAGNOSIS — N183 Chronic kidney disease, stage 3 unspecified: Secondary | ICD-10-CM | POA: Diagnosis not present

## 2023-06-30 ENCOUNTER — Telehealth: Payer: Self-pay | Admitting: Family Medicine

## 2023-06-30 DIAGNOSIS — E039 Hypothyroidism, unspecified: Secondary | ICD-10-CM | POA: Diagnosis not present

## 2023-06-30 DIAGNOSIS — N183 Chronic kidney disease, stage 3 unspecified: Secondary | ICD-10-CM | POA: Diagnosis not present

## 2023-06-30 DIAGNOSIS — N3941 Urge incontinence: Secondary | ICD-10-CM | POA: Diagnosis not present

## 2023-06-30 DIAGNOSIS — I890 Lymphedema, not elsewhere classified: Secondary | ICD-10-CM | POA: Diagnosis not present

## 2023-06-30 DIAGNOSIS — I5032 Chronic diastolic (congestive) heart failure: Secondary | ICD-10-CM | POA: Diagnosis not present

## 2023-06-30 DIAGNOSIS — M810 Age-related osteoporosis without current pathological fracture: Secondary | ICD-10-CM | POA: Diagnosis not present

## 2023-06-30 DIAGNOSIS — F331 Major depressive disorder, recurrent, moderate: Secondary | ICD-10-CM | POA: Diagnosis not present

## 2023-06-30 DIAGNOSIS — I130 Hypertensive heart and chronic kidney disease with heart failure and stage 1 through stage 4 chronic kidney disease, or unspecified chronic kidney disease: Secondary | ICD-10-CM | POA: Diagnosis not present

## 2023-06-30 DIAGNOSIS — D631 Anemia in chronic kidney disease: Secondary | ICD-10-CM | POA: Diagnosis not present

## 2023-06-30 NOTE — Telephone Encounter (Signed)
Ladona Ridgel from [facility] called to report that patient had a slightly elevated b/p of 171/78. She also have some leg swelling with more in the right leg than left.

## 2023-07-02 DIAGNOSIS — N183 Chronic kidney disease, stage 3 unspecified: Secondary | ICD-10-CM | POA: Diagnosis not present

## 2023-07-02 DIAGNOSIS — I130 Hypertensive heart and chronic kidney disease with heart failure and stage 1 through stage 4 chronic kidney disease, or unspecified chronic kidney disease: Secondary | ICD-10-CM | POA: Diagnosis not present

## 2023-07-02 DIAGNOSIS — I890 Lymphedema, not elsewhere classified: Secondary | ICD-10-CM | POA: Diagnosis not present

## 2023-07-02 DIAGNOSIS — E039 Hypothyroidism, unspecified: Secondary | ICD-10-CM | POA: Diagnosis not present

## 2023-07-02 DIAGNOSIS — I5032 Chronic diastolic (congestive) heart failure: Secondary | ICD-10-CM | POA: Diagnosis not present

## 2023-07-02 DIAGNOSIS — M810 Age-related osteoporosis without current pathological fracture: Secondary | ICD-10-CM | POA: Diagnosis not present

## 2023-07-02 DIAGNOSIS — F331 Major depressive disorder, recurrent, moderate: Secondary | ICD-10-CM | POA: Diagnosis not present

## 2023-07-02 DIAGNOSIS — N3941 Urge incontinence: Secondary | ICD-10-CM | POA: Diagnosis not present

## 2023-07-02 DIAGNOSIS — D631 Anemia in chronic kidney disease: Secondary | ICD-10-CM | POA: Diagnosis not present

## 2023-07-07 DIAGNOSIS — I890 Lymphedema, not elsewhere classified: Secondary | ICD-10-CM | POA: Diagnosis not present

## 2023-07-07 DIAGNOSIS — N183 Chronic kidney disease, stage 3 unspecified: Secondary | ICD-10-CM | POA: Diagnosis not present

## 2023-07-07 DIAGNOSIS — D631 Anemia in chronic kidney disease: Secondary | ICD-10-CM | POA: Diagnosis not present

## 2023-07-07 DIAGNOSIS — N3941 Urge incontinence: Secondary | ICD-10-CM | POA: Diagnosis not present

## 2023-07-07 DIAGNOSIS — I130 Hypertensive heart and chronic kidney disease with heart failure and stage 1 through stage 4 chronic kidney disease, or unspecified chronic kidney disease: Secondary | ICD-10-CM | POA: Diagnosis not present

## 2023-07-07 DIAGNOSIS — I5032 Chronic diastolic (congestive) heart failure: Secondary | ICD-10-CM | POA: Diagnosis not present

## 2023-07-07 DIAGNOSIS — F331 Major depressive disorder, recurrent, moderate: Secondary | ICD-10-CM | POA: Diagnosis not present

## 2023-07-07 DIAGNOSIS — E039 Hypothyroidism, unspecified: Secondary | ICD-10-CM | POA: Diagnosis not present

## 2023-07-07 DIAGNOSIS — M810 Age-related osteoporosis without current pathological fracture: Secondary | ICD-10-CM | POA: Diagnosis not present

## 2023-07-07 MED ORDER — FUROSEMIDE 40 MG PO TABS: 20.0000 mg | ORAL_TABLET | Freq: Two times a day (BID) | ORAL | 1 refills | Status: DC

## 2023-07-07 MED ORDER — FUROSEMIDE 40 MG PO TABS: 40.0000 mg | ORAL_TABLET | Freq: Every day | ORAL | 1 refills | Status: DC

## 2023-07-07 NOTE — Telephone Encounter (Signed)
I've sent new lasix dose of 20mg  twice daily (am and early afternoon) to her pharmacy to start taking. Let me know how she does with this.

## 2023-07-07 NOTE — Addendum Note (Signed)
Addended by: Eustaquio Boyden on: 07/07/2023 07:43 AM   Modules accepted: Orders

## 2023-07-07 NOTE — Telephone Encounter (Signed)
Spoke with pt's daughter, [name] (on dpr), relaying message we received from [facility] and Dr. Timoteo Expose message in response. She verbalizes understanding and will pick up med today.

## 2023-07-09 DIAGNOSIS — M810 Age-related osteoporosis without current pathological fracture: Secondary | ICD-10-CM | POA: Diagnosis not present

## 2023-07-09 DIAGNOSIS — I130 Hypertensive heart and chronic kidney disease with heart failure and stage 1 through stage 4 chronic kidney disease, or unspecified chronic kidney disease: Secondary | ICD-10-CM | POA: Diagnosis not present

## 2023-07-09 DIAGNOSIS — F331 Major depressive disorder, recurrent, moderate: Secondary | ICD-10-CM | POA: Diagnosis not present

## 2023-07-09 DIAGNOSIS — N183 Chronic kidney disease, stage 3 unspecified: Secondary | ICD-10-CM | POA: Diagnosis not present

## 2023-07-09 DIAGNOSIS — I890 Lymphedema, not elsewhere classified: Secondary | ICD-10-CM | POA: Diagnosis not present

## 2023-07-09 DIAGNOSIS — D631 Anemia in chronic kidney disease: Secondary | ICD-10-CM | POA: Diagnosis not present

## 2023-07-09 DIAGNOSIS — E039 Hypothyroidism, unspecified: Secondary | ICD-10-CM | POA: Diagnosis not present

## 2023-07-09 DIAGNOSIS — I5032 Chronic diastolic (congestive) heart failure: Secondary | ICD-10-CM | POA: Diagnosis not present

## 2023-07-09 DIAGNOSIS — N3941 Urge incontinence: Secondary | ICD-10-CM | POA: Diagnosis not present

## 2023-07-11 ENCOUNTER — Other Ambulatory Visit: Payer: Self-pay | Admitting: Family Medicine

## 2023-07-11 DIAGNOSIS — D631 Anemia in chronic kidney disease: Secondary | ICD-10-CM | POA: Diagnosis not present

## 2023-07-11 DIAGNOSIS — I130 Hypertensive heart and chronic kidney disease with heart failure and stage 1 through stage 4 chronic kidney disease, or unspecified chronic kidney disease: Secondary | ICD-10-CM | POA: Diagnosis not present

## 2023-07-11 DIAGNOSIS — E039 Hypothyroidism, unspecified: Secondary | ICD-10-CM | POA: Diagnosis not present

## 2023-07-11 DIAGNOSIS — I5032 Chronic diastolic (congestive) heart failure: Secondary | ICD-10-CM | POA: Diagnosis not present

## 2023-07-11 DIAGNOSIS — F331 Major depressive disorder, recurrent, moderate: Secondary | ICD-10-CM | POA: Diagnosis not present

## 2023-07-11 DIAGNOSIS — M810 Age-related osteoporosis without current pathological fracture: Secondary | ICD-10-CM | POA: Diagnosis not present

## 2023-07-11 DIAGNOSIS — N183 Chronic kidney disease, stage 3 unspecified: Secondary | ICD-10-CM | POA: Diagnosis not present

## 2023-07-11 DIAGNOSIS — N3941 Urge incontinence: Secondary | ICD-10-CM | POA: Diagnosis not present

## 2023-07-11 DIAGNOSIS — I890 Lymphedema, not elsewhere classified: Secondary | ICD-10-CM | POA: Diagnosis not present

## 2023-07-11 NOTE — Telephone Encounter (Signed)
Lasix 20mg  BID sent to [facility]

## 2023-07-11 NOTE — Telephone Encounter (Signed)
Per 06/30/23 phn note, pt is to take Lasix 20 mg BID. However, rx sent to pharmacy on 07/07/23 is for 40 mg, 0.5 tab BID, # 180/1. Should it be #90 tabs?

## 2023-07-15 DIAGNOSIS — I5032 Chronic diastolic (congestive) heart failure: Secondary | ICD-10-CM | POA: Diagnosis not present

## 2023-07-15 DIAGNOSIS — N183 Chronic kidney disease, stage 3 unspecified: Secondary | ICD-10-CM | POA: Diagnosis not present

## 2023-07-15 DIAGNOSIS — E039 Hypothyroidism, unspecified: Secondary | ICD-10-CM | POA: Diagnosis not present

## 2023-07-15 DIAGNOSIS — D631 Anemia in chronic kidney disease: Secondary | ICD-10-CM | POA: Diagnosis not present

## 2023-07-15 DIAGNOSIS — I130 Hypertensive heart and chronic kidney disease with heart failure and stage 1 through stage 4 chronic kidney disease, or unspecified chronic kidney disease: Secondary | ICD-10-CM | POA: Diagnosis not present

## 2023-07-15 DIAGNOSIS — I890 Lymphedema, not elsewhere classified: Secondary | ICD-10-CM | POA: Diagnosis not present

## 2023-07-15 DIAGNOSIS — F331 Major depressive disorder, recurrent, moderate: Secondary | ICD-10-CM | POA: Diagnosis not present

## 2023-07-15 DIAGNOSIS — M810 Age-related osteoporosis without current pathological fracture: Secondary | ICD-10-CM | POA: Diagnosis not present

## 2023-07-15 DIAGNOSIS — N3941 Urge incontinence: Secondary | ICD-10-CM | POA: Diagnosis not present

## 2023-07-16 DIAGNOSIS — E039 Hypothyroidism, unspecified: Secondary | ICD-10-CM | POA: Diagnosis not present

## 2023-07-16 DIAGNOSIS — M810 Age-related osteoporosis without current pathological fracture: Secondary | ICD-10-CM | POA: Diagnosis not present

## 2023-07-16 DIAGNOSIS — D631 Anemia in chronic kidney disease: Secondary | ICD-10-CM | POA: Diagnosis not present

## 2023-07-16 DIAGNOSIS — I5032 Chronic diastolic (congestive) heart failure: Secondary | ICD-10-CM | POA: Diagnosis not present

## 2023-07-16 DIAGNOSIS — N3941 Urge incontinence: Secondary | ICD-10-CM | POA: Diagnosis not present

## 2023-07-16 DIAGNOSIS — I890 Lymphedema, not elsewhere classified: Secondary | ICD-10-CM | POA: Diagnosis not present

## 2023-07-16 DIAGNOSIS — I130 Hypertensive heart and chronic kidney disease with heart failure and stage 1 through stage 4 chronic kidney disease, or unspecified chronic kidney disease: Secondary | ICD-10-CM | POA: Diagnosis not present

## 2023-07-16 DIAGNOSIS — F331 Major depressive disorder, recurrent, moderate: Secondary | ICD-10-CM | POA: Diagnosis not present

## 2023-07-16 DIAGNOSIS — N183 Chronic kidney disease, stage 3 unspecified: Secondary | ICD-10-CM | POA: Diagnosis not present

## 2023-07-17 DIAGNOSIS — I890 Lymphedema, not elsewhere classified: Secondary | ICD-10-CM | POA: Diagnosis not present

## 2023-07-17 DIAGNOSIS — N183 Chronic kidney disease, stage 3 unspecified: Secondary | ICD-10-CM | POA: Diagnosis not present

## 2023-07-17 DIAGNOSIS — E039 Hypothyroidism, unspecified: Secondary | ICD-10-CM | POA: Diagnosis not present

## 2023-07-17 DIAGNOSIS — N3941 Urge incontinence: Secondary | ICD-10-CM | POA: Diagnosis not present

## 2023-07-17 DIAGNOSIS — I5032 Chronic diastolic (congestive) heart failure: Secondary | ICD-10-CM | POA: Diagnosis not present

## 2023-07-17 DIAGNOSIS — M810 Age-related osteoporosis without current pathological fracture: Secondary | ICD-10-CM | POA: Diagnosis not present

## 2023-07-17 DIAGNOSIS — D631 Anemia in chronic kidney disease: Secondary | ICD-10-CM | POA: Diagnosis not present

## 2023-07-17 DIAGNOSIS — F331 Major depressive disorder, recurrent, moderate: Secondary | ICD-10-CM | POA: Diagnosis not present

## 2023-07-17 DIAGNOSIS — I130 Hypertensive heart and chronic kidney disease with heart failure and stage 1 through stage 4 chronic kidney disease, or unspecified chronic kidney disease: Secondary | ICD-10-CM | POA: Diagnosis not present

## 2023-07-21 DIAGNOSIS — N183 Chronic kidney disease, stage 3 unspecified: Secondary | ICD-10-CM | POA: Diagnosis not present

## 2023-07-21 DIAGNOSIS — M810 Age-related osteoporosis without current pathological fracture: Secondary | ICD-10-CM | POA: Diagnosis not present

## 2023-07-21 DIAGNOSIS — I5032 Chronic diastolic (congestive) heart failure: Secondary | ICD-10-CM | POA: Diagnosis not present

## 2023-07-21 DIAGNOSIS — I130 Hypertensive heart and chronic kidney disease with heart failure and stage 1 through stage 4 chronic kidney disease, or unspecified chronic kidney disease: Secondary | ICD-10-CM | POA: Diagnosis not present

## 2023-07-21 DIAGNOSIS — N3941 Urge incontinence: Secondary | ICD-10-CM | POA: Diagnosis not present

## 2023-07-21 DIAGNOSIS — D631 Anemia in chronic kidney disease: Secondary | ICD-10-CM | POA: Diagnosis not present

## 2023-07-21 DIAGNOSIS — E039 Hypothyroidism, unspecified: Secondary | ICD-10-CM | POA: Diagnosis not present

## 2023-07-21 DIAGNOSIS — F331 Major depressive disorder, recurrent, moderate: Secondary | ICD-10-CM | POA: Diagnosis not present

## 2023-07-21 DIAGNOSIS — I890 Lymphedema, not elsewhere classified: Secondary | ICD-10-CM | POA: Diagnosis not present

## 2023-07-24 DIAGNOSIS — E039 Hypothyroidism, unspecified: Secondary | ICD-10-CM | POA: Diagnosis not present

## 2023-07-24 DIAGNOSIS — F331 Major depressive disorder, recurrent, moderate: Secondary | ICD-10-CM | POA: Diagnosis not present

## 2023-07-24 DIAGNOSIS — M810 Age-related osteoporosis without current pathological fracture: Secondary | ICD-10-CM | POA: Diagnosis not present

## 2023-07-24 DIAGNOSIS — D631 Anemia in chronic kidney disease: Secondary | ICD-10-CM | POA: Diagnosis not present

## 2023-07-24 DIAGNOSIS — N183 Chronic kidney disease, stage 3 unspecified: Secondary | ICD-10-CM | POA: Diagnosis not present

## 2023-07-24 DIAGNOSIS — I890 Lymphedema, not elsewhere classified: Secondary | ICD-10-CM | POA: Diagnosis not present

## 2023-07-24 DIAGNOSIS — I5032 Chronic diastolic (congestive) heart failure: Secondary | ICD-10-CM | POA: Diagnosis not present

## 2023-07-24 DIAGNOSIS — N3941 Urge incontinence: Secondary | ICD-10-CM | POA: Diagnosis not present

## 2023-07-24 DIAGNOSIS — I130 Hypertensive heart and chronic kidney disease with heart failure and stage 1 through stage 4 chronic kidney disease, or unspecified chronic kidney disease: Secondary | ICD-10-CM | POA: Diagnosis not present

## 2023-07-28 DIAGNOSIS — F331 Major depressive disorder, recurrent, moderate: Secondary | ICD-10-CM | POA: Diagnosis not present

## 2023-07-28 DIAGNOSIS — N183 Chronic kidney disease, stage 3 unspecified: Secondary | ICD-10-CM | POA: Diagnosis not present

## 2023-07-28 DIAGNOSIS — N3941 Urge incontinence: Secondary | ICD-10-CM | POA: Diagnosis not present

## 2023-07-28 DIAGNOSIS — M810 Age-related osteoporosis without current pathological fracture: Secondary | ICD-10-CM | POA: Diagnosis not present

## 2023-07-28 DIAGNOSIS — I5032 Chronic diastolic (congestive) heart failure: Secondary | ICD-10-CM | POA: Diagnosis not present

## 2023-07-28 DIAGNOSIS — D631 Anemia in chronic kidney disease: Secondary | ICD-10-CM | POA: Diagnosis not present

## 2023-07-28 DIAGNOSIS — E039 Hypothyroidism, unspecified: Secondary | ICD-10-CM | POA: Diagnosis not present

## 2023-07-28 DIAGNOSIS — I890 Lymphedema, not elsewhere classified: Secondary | ICD-10-CM | POA: Diagnosis not present

## 2023-07-28 DIAGNOSIS — I130 Hypertensive heart and chronic kidney disease with heart failure and stage 1 through stage 4 chronic kidney disease, or unspecified chronic kidney disease: Secondary | ICD-10-CM | POA: Diagnosis not present

## 2023-07-30 ENCOUNTER — Encounter (INDEPENDENT_AMBULATORY_CARE_PROVIDER_SITE_OTHER): Payer: Self-pay

## 2023-08-01 DIAGNOSIS — I890 Lymphedema, not elsewhere classified: Secondary | ICD-10-CM | POA: Diagnosis not present

## 2023-08-01 DIAGNOSIS — N3941 Urge incontinence: Secondary | ICD-10-CM | POA: Diagnosis not present

## 2023-08-01 DIAGNOSIS — D631 Anemia in chronic kidney disease: Secondary | ICD-10-CM | POA: Diagnosis not present

## 2023-08-01 DIAGNOSIS — I5032 Chronic diastolic (congestive) heart failure: Secondary | ICD-10-CM | POA: Diagnosis not present

## 2023-08-01 DIAGNOSIS — N183 Chronic kidney disease, stage 3 unspecified: Secondary | ICD-10-CM | POA: Diagnosis not present

## 2023-08-01 DIAGNOSIS — M810 Age-related osteoporosis without current pathological fracture: Secondary | ICD-10-CM | POA: Diagnosis not present

## 2023-08-01 DIAGNOSIS — E039 Hypothyroidism, unspecified: Secondary | ICD-10-CM | POA: Diagnosis not present

## 2023-08-01 DIAGNOSIS — I130 Hypertensive heart and chronic kidney disease with heart failure and stage 1 through stage 4 chronic kidney disease, or unspecified chronic kidney disease: Secondary | ICD-10-CM | POA: Diagnosis not present

## 2023-08-01 DIAGNOSIS — F331 Major depressive disorder, recurrent, moderate: Secondary | ICD-10-CM | POA: Diagnosis not present

## 2023-08-02 DIAGNOSIS — I130 Hypertensive heart and chronic kidney disease with heart failure and stage 1 through stage 4 chronic kidney disease, or unspecified chronic kidney disease: Secondary | ICD-10-CM | POA: Diagnosis not present

## 2023-08-02 DIAGNOSIS — M810 Age-related osteoporosis without current pathological fracture: Secondary | ICD-10-CM | POA: Diagnosis not present

## 2023-08-02 DIAGNOSIS — I5032 Chronic diastolic (congestive) heart failure: Secondary | ICD-10-CM | POA: Diagnosis not present

## 2023-08-02 DIAGNOSIS — N3941 Urge incontinence: Secondary | ICD-10-CM | POA: Diagnosis not present

## 2023-08-02 DIAGNOSIS — N183 Chronic kidney disease, stage 3 unspecified: Secondary | ICD-10-CM | POA: Diagnosis not present

## 2023-08-02 DIAGNOSIS — E039 Hypothyroidism, unspecified: Secondary | ICD-10-CM | POA: Diagnosis not present

## 2023-08-02 DIAGNOSIS — I890 Lymphedema, not elsewhere classified: Secondary | ICD-10-CM | POA: Diagnosis not present

## 2023-08-02 DIAGNOSIS — F331 Major depressive disorder, recurrent, moderate: Secondary | ICD-10-CM | POA: Diagnosis not present

## 2023-08-02 DIAGNOSIS — D631 Anemia in chronic kidney disease: Secondary | ICD-10-CM | POA: Diagnosis not present

## 2023-08-04 ENCOUNTER — Telehealth: Payer: Self-pay | Admitting: Family Medicine

## 2023-08-04 DIAGNOSIS — M810 Age-related osteoporosis without current pathological fracture: Secondary | ICD-10-CM | POA: Diagnosis not present

## 2023-08-04 DIAGNOSIS — I5032 Chronic diastolic (congestive) heart failure: Secondary | ICD-10-CM | POA: Diagnosis not present

## 2023-08-04 DIAGNOSIS — I890 Lymphedema, not elsewhere classified: Secondary | ICD-10-CM | POA: Diagnosis not present

## 2023-08-04 DIAGNOSIS — I130 Hypertensive heart and chronic kidney disease with heart failure and stage 1 through stage 4 chronic kidney disease, or unspecified chronic kidney disease: Secondary | ICD-10-CM | POA: Diagnosis not present

## 2023-08-04 DIAGNOSIS — F331 Major depressive disorder, recurrent, moderate: Secondary | ICD-10-CM | POA: Diagnosis not present

## 2023-08-04 DIAGNOSIS — D631 Anemia in chronic kidney disease: Secondary | ICD-10-CM | POA: Diagnosis not present

## 2023-08-04 DIAGNOSIS — N183 Chronic kidney disease, stage 3 unspecified: Secondary | ICD-10-CM | POA: Diagnosis not present

## 2023-08-04 DIAGNOSIS — E039 Hypothyroidism, unspecified: Secondary | ICD-10-CM | POA: Diagnosis not present

## 2023-08-04 DIAGNOSIS — N3941 Urge incontinence: Secondary | ICD-10-CM | POA: Diagnosis not present

## 2023-08-04 NOTE — Telephone Encounter (Signed)
Vanessa Ralphs OT called in stating that patient is complaining about pain between 8 and 10 in right hip ,side and knee . She is taking tylenol but it is not helping. She would like to know if she can have a pain patch,or pain medication called in for her,or what would Dr Reece Agar suggest?

## 2023-08-04 NOTE — Telephone Encounter (Signed)
Spoke with pt's daughter, [name] (on dpr), notifying her pt will need OV.  Schedule OV on 08/06/23 at 10:00.

## 2023-08-06 ENCOUNTER — Ambulatory Visit (INDEPENDENT_AMBULATORY_CARE_PROVIDER_SITE_OTHER): Payer: Medicare HMO | Admitting: Family Medicine

## 2023-08-06 ENCOUNTER — Ambulatory Visit (INDEPENDENT_AMBULATORY_CARE_PROVIDER_SITE_OTHER)
Admission: RE | Admit: 2023-08-06 | Discharge: 2023-08-06 | Disposition: A | Discharge status: Home / Self Care | Payer: Medicare HMO | Source: Ambulatory Visit | Attending: Family Medicine | Admitting: Family Medicine

## 2023-08-06 ENCOUNTER — Encounter: Payer: Self-pay | Admitting: Family Medicine

## 2023-08-06 VITALS — BP 150/60 | HR 62 | Temp 97.9°F | Ht 61.0 in | Wt 201.4 lb

## 2023-08-06 DIAGNOSIS — Z7409 Other reduced mobility: Secondary | ICD-10-CM | POA: Diagnosis not present

## 2023-08-06 DIAGNOSIS — M79604 Pain in right leg: Secondary | ICD-10-CM

## 2023-08-06 DIAGNOSIS — Z96651 Presence of right artificial knee joint: Secondary | ICD-10-CM | POA: Diagnosis not present

## 2023-08-06 DIAGNOSIS — R600 Localized edema: Secondary | ICD-10-CM

## 2023-08-06 DIAGNOSIS — M159 Polyosteoarthritis, unspecified: Secondary | ICD-10-CM | POA: Diagnosis not present

## 2023-08-06 DIAGNOSIS — M1611 Unilateral primary osteoarthritis, right hip: Secondary | ICD-10-CM | POA: Diagnosis not present

## 2023-08-06 DIAGNOSIS — N1832 Chronic kidney disease, stage 3b: Secondary | ICD-10-CM | POA: Diagnosis not present

## 2023-08-06 DIAGNOSIS — I10 Essential (primary) hypertension: Secondary | ICD-10-CM | POA: Diagnosis not present

## 2023-08-06 DIAGNOSIS — Z741 Need for assistance with personal care: Secondary | ICD-10-CM | POA: Diagnosis not present

## 2023-08-06 LAB — RENAL FUNCTION PANEL
Albumin: 3.8 g/dL (ref 3.5–5.2)
BUN: 17 mg/dL (ref 6–23)
CO2: 30 mEq/L (ref 19–32)
Calcium: 8.9 mg/dL (ref 8.4–10.5)
Chloride: 104 mEq/L (ref 96–112)
Creatinine, Ser: 1.33 mg/dL — ABNORMAL HIGH (ref 0.40–1.20)
GFR: 35.06 mL/min — ABNORMAL LOW (ref >60.00)
Glucose, Bld: 86 mg/dL (ref 70–99)
Phosphorus: 2.6 mg/dL (ref 2.3–4.6)
Potassium: 3.5 mEq/L (ref 3.5–5.1)
Sodium: 142 mEq/L (ref 135–145)

## 2023-08-06 LAB — CK: Total CK: 58 U/L (ref 7–177)

## 2023-08-06 NOTE — Progress Notes (Unsigned)
Ph: [phone] Fax: [phone]   Patient ID: [name], female    DOB: 12-14-1930, 89 y.o.   MRN: [id number]  This visit was conducted in person.  BP (!) 150/60 (BP Location: Right Arm, Cuff Size: Large)   Pulse 62   Temp 97.9 F (36.6 C) (Temporal)   Ht 5\' 1"  (1.549 m)   Wt 201 lb 6 oz (91.3 kg)   SpO2 98%   BMI 38.05 kg/m    CC: worsening R hip/knee pain "my leg's been giving me a fit" Subjective:   HPI: [name] is a 89 y.o. female presenting on 08/06/2023 for Hip Pain (C/o worsening R hip/knee pain- disturbing sleep. Started 1-2 mos ago. Was checked for blood clots at last hosp visit. HH nurse suggested pt see PCP. Pt accompanied by daughter, [name]. )   Almost 2 mo h/o R knee and hip pain. Seen at ER 06/15/2023 for this - venous US at that time negative for BLE DVT. CXR showed stable cardiomegaly. BNP 300s, Cr 1.2, mild anemia Hgb 11. Ca low at 8.7. She continues calcium gluconate 500mg  daily. Treated in ER with PO furosemide 40mg  x1.   Describes anterior thigh pain, feels knots. Can be stabbing. Dorsal foot numbness. No leg weakness. No bowel/bladder incontinence. Does have urge incontinence - has depends. No saddle anesthesia or fever. No bruising or skin changes Denies inciting trauma/injury or fall.  Since ER visit she's been receiving home health skilled nursing, nurse aide and physical therapy [facility]).  She's been using aspercreme and extreme muscle rub, considering horse linement ointment.   She regularly uses rollator. Requests new Rx as her current one is broken (used for 3+ years). She lives on second floor, uses elevator.   OP - received prolia injection 06/15/2023. Request[place] personal care assistance information.   HTN - she continues lasix 20mg  twice daily, hydralazine 25mg  twice daily, lisinopril 40mg  daily and toprol XL 25mg  daily and verapamil 240mg  daily. Home BP readings running high.      Relevant past medical, surgical,  family and social history reviewed and updated as indicated. Interim medical history since our last visit reviewed. Allergies and medications reviewed and updated. Outpatient Medications Prior to Visit  Medication Sig Dispense Refill   acetaminophen (TYLENOL) 500 MG tablet Take 1,000 mg by mouth See admin instructions. Take 1,000 mg by mouth in the morning and an additional 1,000 mg once a day as needed for pain     Ascorbic Acid (VITAMIN C PO) Take 1 tablet by mouth daily with breakfast.     calcium gluconate 500 MG tablet Take 1 tablet (500 mg total) by mouth daily.     cholecalciferol (VITAMIN D) 1000 UNITS tablet Take 1,000 Units by mouth daily.     denosumab (PROLIA) 60 MG/ML SOSY injection Inject 60 mg into the skin every 6 (six) months. Patient scheduled for 06/11/23 (Patient taking differently: Inject 60 mg into the skin every 6 (six) months.) 1 mL 0   diphenhydramine-acetaminophen (TYLENOL PM) 25-500 MG TABS tablet Take 1 tablet by mouth at bedtime as needed (sleep). (Patient taking differently: Take 1 tablet by mouth at bedtime.)     docusate sodium (COLACE) 100 MG capsule Take 100 mg by mouth 2 (two) times daily as needed for mild constipation.     donepezil (ARICEPT) 5 MG tablet Take 1 tablet (5 mg total) by mouth at bedtime. For memory 30 tablet 9   famotidine (PEPCID) 20 MG tablet Take  20 mg by mouth daily.     FLUoxetine (PROZAC) 40 MG capsule Take 1 capsule (40 mg total) by mouth daily. 90 capsule 4   hydrALAZINE (APRESOLINE) 25 MG tablet TAKE 1 TABLET IN THE MORNING AND AT BEDTIME. (Patient taking differently: Take 25 mg by mouth in the morning and at bedtime.) 180 tablet 4   Iron, Ferrous Sulfate, 325 (65 Fe) MG TABS Take 325 mg by mouth every Monday, Wednesday, and Friday.     levothyroxine (SYNTHROID) 88 MCG tablet TAKE 1 TABLET EVERY DAY BEFORE BREAKFAST (Patient taking differently: Take 88 mcg by mouth daily before breakfast.) 90 tablet 4   lisinopril (ZESTRIL) 40 MG tablet Take  1 tablet (40 mg total) by mouth daily. (Patient taking differently: Take 40 mg by mouth in the morning.) 90 tablet 4   loratadine (CLARITIN) 10 MG tablet Take 1 tablet (10 mg total) by mouth every evening. 90 tablet 4   metoprolol succinate (TOPROL-XL) 25 MG 24 hr tablet Take 1 tablet (25 mg total) by mouth daily. (Patient taking differently: Take 25 mg by mouth at bedtime.) 90 tablet 4   mirabegron ER (MYRBETRIQ) 25 MG TB24 tablet Take 1 tablet (25 mg total) by mouth daily. (Patient taking differently: Take 25 mg by mouth at bedtime.) 90 tablet 4   omeprazole (PRILOSEC) 40 MG capsule Take 1 capsule (40 mg total) by mouth daily. (Patient taking differently: Take 40 mg by mouth daily before breakfast.) 90 capsule 4   polyethylene glycol (MIRALAX / GLYCOLAX) 17 g packet Take 17 g by mouth daily as needed for mild constipation. 14 each 0   pravastatin (PRAVACHOL) 40 MG tablet Take 1 tablet (40 mg total) by mouth daily. (Patient taking differently: Take 40 mg by mouth at bedtime.) 90 tablet 4   Propylene Glycol (SYSTANE BALANCE OP) Place 1 drop into both eyes 3 (three) times daily as needed (for dryness).     verapamil (CALAN-SR) 240 MG CR tablet Take 1 tablet (240 mg total) by mouth daily. (Patient taking differently: Take 240 mg by mouth at bedtime.) 90 tablet 4   vitamin B-12 (CYANOCOBALAMIN) 500 MCG tablet Take 1 tablet (500 mcg total) by mouth once a week. (Patient taking differently: Take 250 mcg by mouth every Thursday.)     furosemide (LASIX) 20 MG tablet Take 1 tablet (20 mg total) by mouth 2 (two) times daily. 180 tablet 3   albuterol (VENTOLIN HFA) 108 (90 Base) MCG/ACT inhaler Inhale 1 - 2 puffs into the lungs every 6 hours as needed for wheezing or shortness of breath. 18 g 0   No facility-administered medications prior to visit.     Per HPI unless specifically indicated in ROS section below Review of Systems  Objective:  BP (!) 150/60 (BP Location: Right Arm, Cuff Size: Large)   Pulse  62   Temp 97.9 F (36.6 C) (Temporal)   Ht 5\' 1"  (1.549 m)   Wt 201 lb 6 oz (91.3 kg)   SpO2 98%   BMI 38.05 kg/m   Wt Readings from Last 3 Encounters:  08/06/23 201 lb 6 oz (91.3 kg)  05/19/23 201 lb 6 oz (91.3 kg)  04/25/23 211 lb 6.4 oz (95.9 kg)      Physical Exam Vitals and nursing note reviewed.  Constitutional:      Appearance: Normal appearance. She is not ill-appearing.     Comments: Ambulates with rollator  HENT:     Head: Normocephalic and atraumatic.     Mouth/Throat:  Mouth: Mucous membranes are moist.     Pharynx: Oropharynx is clear. No oropharyngeal exudate or posterior oropharyngeal erythema.  Eyes:     Extraocular Movements: Extraocular movements intact.     Pupils: Pupils are equal, round, and reactive to light.  Cardiovascular:     Rate and Rhythm: Normal rate and regular rhythm.     Pulses: Normal pulses.     Heart sounds: Normal heart sounds. No murmur heard. Pulmonary:     Effort: Pulmonary effort is normal. No respiratory distress.     Breath sounds: Normal breath sounds. No wheezing, rhonchi or rales.  Musculoskeletal:     Right lower leg: Edema (1+) present.     Left lower leg: Edema (1+) present.     Comments:  No pain midline spine No paraspinous mm tenderness Neg seated SLR on right. No significant pain with int/ext rotation at hip. + pain at GTB and sciatic notch on right.   Skin:    General: Skin is warm and dry.     Findings: No erythema or rash.  Neurological:     Mental Status: She is alert.  Psychiatric:        Mood and Affect: Mood normal.        Behavior: Behavior normal.       Results for orders placed or performed in visit on 08/06/23  CK  Result Value Ref Range   Total CK 58 7 - 177 U/L  Renal function panel  Result Value Ref Range   Sodium 142 135 - 145 mEq/L   Potassium 3.5 3.5 - 5.1 mEq/L   Chloride 104 96 - 112 mEq/L   CO2 30 19 - 32 mEq/L   Albumin 3.8 3.5 - 5.2 g/dL   BUN 17 6 - 23 mg/dL   Creatinine,  Ser 1.61 (H) 0.40 - 1.20 mg/dL   Glucose, Bld 86 70 - 99 mg/dL   Phosphorus 2.6 2.3 - 4.6 mg/dL   GFR 09.60 (L) >45.40 mL/min   Calcium 8.9 8.4 - 10.5 mg/dL    Assessment & Plan:   Problem List Items Addressed This Visit     Hypertension    Chronic, BP remains elevated despite 5 drug regimen - lasix, hydralazine 25mg  BID, lisinopril 40mg  daily, Toprol XL 25mg  daily and verapamil 240mg  daily.  They note home readings are also high - will increase lasix as per above, reassess at f/u visit in a few weeks      Relevant Medications   furosemide (LASIX) 40 MG tablet   Osteoarthritis   Relevant Medications   predniSONE (DELTASONE) 20 MG tablet   CKD (chronic kidney disease) stage 3, GFR 30-59 ml/min (HCC)   Relevant Orders   Renal function panel (Completed)   Bilateral lower extremity edema    Persists - will change lasix from 20mg  BID to 40mg  daily in am, reassess at f/u visit in a few weeks.  She is not currently on potassium.       Right leg pain - Primary    She has significant discomfort with palpation at right hip trochanteric bursa. Also suspect known knee osteoarthritis contributing. She is s/p multiple knee procedures including replacement. Symptoms not consistent with hip osteoarthritis.  Check CPK and L femur films for further evaluation. In interim, continue tylenol, will see if HHPT can evaluate/treat hip bursitis. She will continue topical treatments to date.  Caution with oral NSAIDs in CKD, caution with opiate pain med in fall risk.  Will Rx prednisone taper,  consider topical voltaren gel.       Relevant Orders   CK (Completed)   DG FEMUR, MIN 2 VIEWS RIGHT   Impaired mobility and personal care    New Rx for rollator with seat provided - asked them to give this to [facility] to assist with obtaining one.  I also provided them with further information for personal care services in [facility].         Meds ordered this encounter  Medications   predniSONE  (DELTASONE) 20 MG tablet    Sig: Take two tablets daily for 3 days followed by one tablet daily for 3 days    Dispense:  9 tablet    Refill:  0   furosemide (LASIX) 40 MG tablet    Sig: Take 1 tablet (40 mg total) by mouth daily.    Dispense:  90 tablet    Refill:  2    Note new dose    Orders Placed This Encounter  Procedures   DG FEMUR, MIN 2 VIEWS RIGHT    Standing Status:   Future    Number of Occurrences:   1    Standing Expiration Date:   08/05/2024    Order Specific Question:   Reason for Exam (SYMPTOM  OR DIAGNOSIS REQUIRED)    Answer:   R leg pain x 1-2 months    Order Specific Question:   Preferred imaging location?    Answer:   [facility]   CK   Renal function panel    Patient Instructions  Labs today  Change lasix water pill to 40mg  once daily in the morning  Right leg xray today I think you have right hip bursitis - I'll see if we can ask home health physical therapy to work with you on this.  Rollator walker with seat prescription written - give to home health physical therapy to see if they can help with getting this.  Consider seeing Dr Patsy Lager sports medicine for possible steroid injection.  Ensure taking calcium pill daily.  Keep follow up appointment in a few weeks.   In-Home Aide Services in [place] [phone] The program contracts with community home care agencies to provide in home services, when there is a need, such as light housekeeping and personal care services (bathing, hair washing, toileting) that assist clients/customers with safely remaining in their homes.  Follow up plan: No follow-ups on file.  Eustaquio Boyden, MD

## 2023-08-06 NOTE — Patient Instructions (Addendum)
Labs today  Change lasix water pill to 40mg  once daily in the morning  Right leg xray today I think you have right hip bursitis - I'll see if we can ask home health physical therapy to work with you on this.  Rollator walker with seat prescription written - give to home health physical therapy to see if they can help with getting this.  Consider seeing Dr Patsy Lager sports medicine for possible steroid injection.  Ensure taking calcium pill daily.  Keep follow up appointment in a few weeks.   In-Home Aide Services in [place] [phone] The program contracts with community home care agencies to provide in home services, when there is a need, such as light housekeeping and personal care services (bathing, hair washing, toileting) that assist clients/customers with safely remaining in their homes.

## 2023-08-07 ENCOUNTER — Telehealth: Payer: Self-pay | Admitting: Family Medicine

## 2023-08-07 ENCOUNTER — Other Ambulatory Visit: Payer: Self-pay | Admitting: Family Medicine

## 2023-08-07 MED ORDER — POTASSIUM CHLORIDE CRYS ER 10 MEQ PO TBCR: 10.0000 meq | EXTENDED_RELEASE_TABLET | Freq: Every day | ORAL | 3 refills | Status: AC

## 2023-08-07 MED ORDER — PREDNISONE 20 MG PO TABS: ORAL_TABLET | ORAL | 0 refills | Status: DC

## 2023-08-07 MED ORDER — FUROSEMIDE 40 MG PO TABS: 40.0000 mg | ORAL_TABLET | Freq: Every day | ORAL | 2 refills | Status: DC

## 2023-08-07 NOTE — Assessment & Plan Note (Addendum)
Persists - will change lasix from 20mg  BID to 40mg  daily in am, reassess at f/u visit in a few weeks.  She is not currently on potassium.

## 2023-08-07 NOTE — Addendum Note (Signed)
Addended by: Eustaquio Boyden on: 08/07/2023 10:58 AM   Modules accepted: Orders

## 2023-08-07 NOTE — Telephone Encounter (Signed)
Noted. [name] made aware of prednisone rx. (See 08/04/23 phn note.)

## 2023-08-07 NOTE — Telephone Encounter (Signed)
Pt's daughter, [name], called returning Lisa's call. Heron Sabins Dr. Timoteo Expose response regarding pt's lab results. [name] had no questions/concerns. Call back # [phone]

## 2023-08-07 NOTE — Assessment & Plan Note (Addendum)
She has significant discomfort with palpation at right hip trochanteric bursa. Also suspect known knee osteoarthritis contributing. She is s/p multiple knee procedures including replacement. Symptoms not consistent with hip osteoarthritis.  Check CPK and L femur films for further evaluation. In interim, continue tylenol, will see if HHPT can evaluate/treat hip bursitis. She will continue topical treatments to date.  Caution with oral NSAIDs in CKD, caution with opiate pain med in fall risk.  Will Rx prednisone taper, consider topical voltaren gel.

## 2023-08-07 NOTE — Assessment & Plan Note (Signed)
Chronic, BP remains elevated despite 5 drug regimen - lasix, hydralazine 25mg  BID, lisinopril 40mg  daily, Toprol XL 25mg  daily and verapamil 240mg  daily.  They note home readings are also high - will increase lasix as per above, reassess at f/u visit in a few weeks

## 2023-08-07 NOTE — Addendum Note (Signed)
Addended by: Eustaquio Boyden on: 08/07/2023 10:36 AM   Modules accepted: Level of Service

## 2023-08-07 NOTE — Telephone Encounter (Signed)
Pt seen yesterday for R leg pain - suspect hip bursitis. Can we call HH PT to see if they can help provide exercises for R hip trochanteric bursitis? Also would offer short prednisone steroid taper which I've sent to her pharmacy. She may also try topical voltaren gel OTC for painful areas of upper leg.

## 2023-08-07 NOTE — Assessment & Plan Note (Signed)
New Rx for rollator with seat provided - asked them to give this to [facility] to assist with obtaining one.  I also provided them with further information for personal care services in [facility].

## 2023-08-07 NOTE — Telephone Encounter (Signed)
Pt daughter, [name], rtn call and is aware of Dr. Timoteo Expose message. (See 08/07/23 phn note.)

## 2023-08-07 NOTE — Telephone Encounter (Signed)
Lvm at [facility] asking them to call back. Need to relay Dr. Timoteo Expose message about PT exercises for R hip.   Also, lvm for pt/pt's daughter, [name] (on dpr), asking them to call back. Need to relay Dr.G's message about prednisone rx and OTC Voltaren gel.

## 2023-08-08 NOTE — Telephone Encounter (Signed)
Lvm at [facility] asking them to call back. Need to relay Dr. Timoteo Expose message about PT exercises for R hip.

## 2023-08-11 NOTE — Telephone Encounter (Signed)
Lvm at [facility] asking them to call back. Need to relay Dr. Timoteo Expose message about PT exercises for R hip.

## 2023-08-12 NOTE — Telephone Encounter (Signed)
Lvm at [facility] asking them to call back. Need to relay Dr. Timoteo Expose message about PT exercises for R hip.

## 2023-08-13 DIAGNOSIS — F331 Major depressive disorder, recurrent, moderate: Secondary | ICD-10-CM | POA: Diagnosis not present

## 2023-08-13 DIAGNOSIS — E039 Hypothyroidism, unspecified: Secondary | ICD-10-CM | POA: Diagnosis not present

## 2023-08-13 DIAGNOSIS — I5032 Chronic diastolic (congestive) heart failure: Secondary | ICD-10-CM | POA: Diagnosis not present

## 2023-08-13 DIAGNOSIS — I130 Hypertensive heart and chronic kidney disease with heart failure and stage 1 through stage 4 chronic kidney disease, or unspecified chronic kidney disease: Secondary | ICD-10-CM | POA: Diagnosis not present

## 2023-08-13 DIAGNOSIS — N183 Chronic kidney disease, stage 3 unspecified: Secondary | ICD-10-CM | POA: Diagnosis not present

## 2023-08-13 DIAGNOSIS — D631 Anemia in chronic kidney disease: Secondary | ICD-10-CM | POA: Diagnosis not present

## 2023-08-13 DIAGNOSIS — N3941 Urge incontinence: Secondary | ICD-10-CM | POA: Diagnosis not present

## 2023-08-13 DIAGNOSIS — M810 Age-related osteoporosis without current pathological fracture: Secondary | ICD-10-CM | POA: Diagnosis not present

## 2023-08-13 DIAGNOSIS — I890 Lymphedema, not elsewhere classified: Secondary | ICD-10-CM | POA: Diagnosis not present

## 2023-08-13 NOTE — Telephone Encounter (Signed)
Lvm at [facility] asking them to call back. Need to relay Dr. Timoteo Expose message about PT exercises for R hip.

## 2023-08-14 ENCOUNTER — Telehealth: Payer: Self-pay | Admitting: Family Medicine

## 2023-08-14 DIAGNOSIS — D631 Anemia in chronic kidney disease: Secondary | ICD-10-CM | POA: Diagnosis not present

## 2023-08-14 DIAGNOSIS — M810 Age-related osteoporosis without current pathological fracture: Secondary | ICD-10-CM | POA: Diagnosis not present

## 2023-08-14 DIAGNOSIS — F331 Major depressive disorder, recurrent, moderate: Secondary | ICD-10-CM | POA: Diagnosis not present

## 2023-08-14 DIAGNOSIS — I130 Hypertensive heart and chronic kidney disease with heart failure and stage 1 through stage 4 chronic kidney disease, or unspecified chronic kidney disease: Secondary | ICD-10-CM | POA: Diagnosis not present

## 2023-08-14 DIAGNOSIS — N3941 Urge incontinence: Secondary | ICD-10-CM | POA: Diagnosis not present

## 2023-08-14 DIAGNOSIS — N183 Chronic kidney disease, stage 3 unspecified: Secondary | ICD-10-CM | POA: Diagnosis not present

## 2023-08-14 DIAGNOSIS — E039 Hypothyroidism, unspecified: Secondary | ICD-10-CM | POA: Diagnosis not present

## 2023-08-14 DIAGNOSIS — I5032 Chronic diastolic (congestive) heart failure: Secondary | ICD-10-CM | POA: Diagnosis not present

## 2023-08-14 DIAGNOSIS — I890 Lymphedema, not elsewhere classified: Secondary | ICD-10-CM | POA: Diagnosis not present

## 2023-08-14 NOTE — Telephone Encounter (Signed)
Faxed 08/06/23 OV to [facility] at [phone], attn:  Amy.

## 2023-08-14 NOTE — Telephone Encounter (Signed)
[name] returned call to the office from [facility], would like a call back whenever possible at [phone]

## 2023-08-14 NOTE — Telephone Encounter (Signed)
Lvm at [facility] asking them to call back. Need to relay Dr. Timoteo Expose message about PT exercises for R hip.

## 2023-08-14 NOTE — Telephone Encounter (Signed)
Spoke with Jason Nest, of [facility], relaying Dr. Timoteo Expose message about adding PT exercises for R hip. Verbalizes understating and stated pt actually was mentioned hip pain yesterday and she saw on pt's AVS (08/06/23 OV notes) about additional PT for the area. Says she will contact us if she needs additional info.

## 2023-08-14 NOTE — Telephone Encounter (Signed)
Amy from [facility] called in requesting office notes ,reason why patient need rolling walker to send in to approve walker . Fax [phone] and call back # [phone]

## 2023-08-18 DIAGNOSIS — M7071 Other bursitis of hip, right hip: Secondary | ICD-10-CM | POA: Diagnosis not present

## 2023-08-18 DIAGNOSIS — E785 Hyperlipidemia, unspecified: Secondary | ICD-10-CM

## 2023-08-18 DIAGNOSIS — Z974 Presence of external hearing-aid: Secondary | ICD-10-CM

## 2023-08-18 DIAGNOSIS — N3941 Urge incontinence: Secondary | ICD-10-CM

## 2023-08-18 DIAGNOSIS — D631 Anemia in chronic kidney disease: Secondary | ICD-10-CM | POA: Diagnosis not present

## 2023-08-18 DIAGNOSIS — I5032 Chronic diastolic (congestive) heart failure: Secondary | ICD-10-CM | POA: Diagnosis not present

## 2023-08-18 DIAGNOSIS — E039 Hypothyroidism, unspecified: Secondary | ICD-10-CM | POA: Diagnosis not present

## 2023-08-18 DIAGNOSIS — Z556 Problems related to health literacy: Secondary | ICD-10-CM

## 2023-08-18 DIAGNOSIS — I890 Lymphedema, not elsewhere classified: Secondary | ICD-10-CM | POA: Diagnosis not present

## 2023-08-18 DIAGNOSIS — N2581 Secondary hyperparathyroidism of renal origin: Secondary | ICD-10-CM

## 2023-08-18 DIAGNOSIS — F331 Major depressive disorder, recurrent, moderate: Secondary | ICD-10-CM | POA: Diagnosis not present

## 2023-08-18 DIAGNOSIS — Z8616 Personal history of COVID-19: Secondary | ICD-10-CM

## 2023-08-18 DIAGNOSIS — M810 Age-related osteoporosis without current pathological fracture: Secondary | ICD-10-CM | POA: Diagnosis not present

## 2023-08-18 DIAGNOSIS — Z9181 History of falling: Secondary | ICD-10-CM

## 2023-08-18 DIAGNOSIS — N183 Chronic kidney disease, stage 3 unspecified: Secondary | ICD-10-CM | POA: Diagnosis not present

## 2023-08-18 DIAGNOSIS — I130 Hypertensive heart and chronic kidney disease with heart failure and stage 1 through stage 4 chronic kidney disease, or unspecified chronic kidney disease: Secondary | ICD-10-CM | POA: Diagnosis not present

## 2023-08-18 DIAGNOSIS — J309 Allergic rhinitis, unspecified: Secondary | ICD-10-CM

## 2023-08-18 DIAGNOSIS — K219 Gastro-esophageal reflux disease without esophagitis: Secondary | ICD-10-CM

## 2023-08-19 ENCOUNTER — Encounter: Payer: Self-pay | Admitting: Family Medicine

## 2023-08-19 ENCOUNTER — Ambulatory Visit (INDEPENDENT_AMBULATORY_CARE_PROVIDER_SITE_OTHER): Payer: Medicare HMO | Admitting: Family Medicine

## 2023-08-19 ENCOUNTER — Ambulatory Visit (INDEPENDENT_AMBULATORY_CARE_PROVIDER_SITE_OTHER)
Admission: RE | Admit: 2023-08-19 | Discharge: 2023-08-19 | Disposition: A | Discharge status: Home / Self Care | Payer: Medicare HMO | Source: Ambulatory Visit | Attending: Family Medicine | Admitting: Family Medicine

## 2023-08-19 VITALS — BP 158/80 | HR 74 | Temp 98.0°F | Ht 61.0 in | Wt 195.2 lb

## 2023-08-19 DIAGNOSIS — M4316 Spondylolisthesis, lumbar region: Secondary | ICD-10-CM | POA: Diagnosis not present

## 2023-08-19 DIAGNOSIS — M79604 Pain in right leg: Secondary | ICD-10-CM

## 2023-08-19 DIAGNOSIS — M810 Age-related osteoporosis without current pathological fracture: Secondary | ICD-10-CM

## 2023-08-19 DIAGNOSIS — R600 Localized edema: Secondary | ICD-10-CM | POA: Diagnosis not present

## 2023-08-19 DIAGNOSIS — I5032 Chronic diastolic (congestive) heart failure: Secondary | ICD-10-CM | POA: Diagnosis not present

## 2023-08-19 DIAGNOSIS — M545 Low back pain, unspecified: Secondary | ICD-10-CM | POA: Diagnosis not present

## 2023-08-19 DIAGNOSIS — I10 Essential (primary) hypertension: Secondary | ICD-10-CM

## 2023-08-19 DIAGNOSIS — M4726 Other spondylosis with radiculopathy, lumbar region: Secondary | ICD-10-CM | POA: Diagnosis not present

## 2023-08-19 DIAGNOSIS — M4187 Other forms of scoliosis, lumbosacral region: Secondary | ICD-10-CM | POA: Diagnosis not present

## 2023-08-19 NOTE — Assessment & Plan Note (Signed)
BP markedly elevated again today. I asked her to monitor readings at home and let me know if consistently elevated >150/100 to titrate antihypertensives accordingly.  She continues hydralazine 25mg  BID, toprol XL 25mg  daily, verapamil CR 240mg  daily, lisinopril 40mg  daily, and now lasix 40mg  daily.

## 2023-08-19 NOTE — Assessment & Plan Note (Signed)
This is better since increasing lasix to 40mg  daily in am - continue higher dose.

## 2023-08-19 NOTE — Assessment & Plan Note (Signed)
Received prolia injection 06/15/2023.

## 2023-08-19 NOTE — Progress Notes (Signed)
Ph: [phone] Fax: [phone]   Patient ID: [name], female    DOB: 1930-09-18, 89 y.o.   MRN: [id number]  This visit was conducted in person.  BP (!) 158/80   Pulse 74   Temp 98 F (36.7 C) (Temporal)   Ht 5\' 1"  (1.549 m)   Wt 195 lb 3.2 oz (88.5 kg)   SpO2 98%   BMI 36.88 kg/m   BP Readings from Last 3 Encounters:  08/19/23 (!) 158/80  08/06/23 (!) 150/60  06/15/23 (!) 180/62  Elevated on repeat testing   CC: R knee f/u visit  Subjective:   HPI: [name] is a 89 y.o. female presenting on 08/19/2023 for Follow-up (3 month f/u still in pain with right knee )   New great grandmother to baby boy in [place]!  3 mo R knee and hip pain s/p ER eval 05/2023 for this. Venous US negative for BLE DVT. CXR showed stable cardiomegaly. At last visit she had significant discomfort to R hip trochanteric bursa. Caution with NSIADs in CKD. Treated with prednisone taper - this didn't help. Topical voltaren hasn't helped either. Ice/heat hasn't helped. Horse linement  ointment and aspercream hasn't helped.   Points to R upper leg anterior to lateral leg.  No numbness or weakness of leg.  No bowel/bladder incontinence.   Last visit provided new Rx for rollator with seat - she has started using this.  New sitter starting this week 4-5 hours twice weekly (T/R).   OP - latest prolia injection 06/15/2023.   H/o remote spine surgery 1970s.  H/o remote steroid injections into spine with benefit.       Relevant past medical, surgical, family and social history reviewed and updated as indicated. Interim medical history since our last visit reviewed. Allergies and medications reviewed and updated. Outpatient Medications Prior to Visit  Medication Sig Dispense Refill   acetaminophen (TYLENOL) 500 MG tablet Take 1,000 mg by mouth See admin instructions. Take 1,000 mg by mouth in the morning and an additional 1,000 mg once a day as needed for pain     Ascorbic Acid (VITAMIN C  PO) Take 1 tablet by mouth daily with breakfast.     calcium gluconate 500 MG tablet Take 1 tablet (500 mg total) by mouth daily.     cholecalciferol (VITAMIN D) 1000 UNITS tablet Take 1,000 Units by mouth daily.     denosumab (PROLIA) 60 MG/ML SOSY injection Inject 60 mg into the skin every 6 (six) months. Patient scheduled for 06/11/23 (Patient taking differently: Inject 60 mg into the skin every 6 (six) months.) 1 mL 0   diphenhydramine-acetaminophen (TYLENOL PM) 25-500 MG TABS tablet Take 1 tablet by mouth at bedtime as needed (sleep). (Patient taking differently: Take 1 tablet by mouth at bedtime.)     docusate sodium (COLACE) 100 MG capsule Take 100 mg by mouth 2 (two) times daily as needed for mild constipation.     donepezil (ARICEPT) 5 MG tablet Take 1 tablet (5 mg total) by mouth at bedtime. For memory 30 tablet 9   famotidine (PEPCID) 20 MG tablet Take 20 mg by mouth daily.     FLUoxetine (PROZAC) 40 MG capsule Take 1 capsule (40 mg total) by mouth daily. 90 capsule 4   furosemide (LASIX) 40 MG tablet Take 1 tablet (40 mg total) by mouth daily. 90 tablet 2   hydrALAZINE (APRESOLINE) 25 MG tablet TAKE 1 TABLET IN THE MORNING AND AT BEDTIME. (Patient taking  differently: Take 25 mg by mouth in the morning and at bedtime.) 180 tablet 4   Iron, Ferrous Sulfate, 325 (65 Fe) MG TABS Take 325 mg by mouth every Monday, Wednesday, and Friday.     levothyroxine (SYNTHROID) 88 MCG tablet TAKE 1 TABLET EVERY DAY BEFORE BREAKFAST (Patient taking differently: Take 88 mcg by mouth daily before breakfast.) 90 tablet 4   lisinopril (ZESTRIL) 40 MG tablet Take 1 tablet (40 mg total) by mouth daily. (Patient taking differently: Take 40 mg by mouth in the morning.) 90 tablet 4   loratadine (CLARITIN) 10 MG tablet Take 1 tablet (10 mg total) by mouth every evening. 90 tablet 4   metoprolol succinate (TOPROL-XL) 25 MG 24 hr tablet Take 1 tablet (25 mg total) by mouth daily. (Patient taking differently: Take 25 mg  by mouth at bedtime.) 90 tablet 4   mirabegron ER (MYRBETRIQ) 25 MG TB24 tablet Take 1 tablet (25 mg total) by mouth daily. (Patient taking differently: Take 25 mg by mouth at bedtime.) 90 tablet 4   omeprazole (PRILOSEC) 40 MG capsule Take 1 capsule (40 mg total) by mouth daily. (Patient taking differently: Take 40 mg by mouth daily before breakfast.) 90 capsule 4   polyethylene glycol (MIRALAX / GLYCOLAX) 17 g packet Take 17 g by mouth daily as needed for mild constipation. 14 each 0   potassium chloride (KLOR-CON M) 10 MEQ tablet Take 1 tablet (10 mEq total) by mouth daily. 90 tablet 3   pravastatin (PRAVACHOL) 40 MG tablet Take 1 tablet (40 mg total) by mouth daily. (Patient taking differently: Take 40 mg by mouth at bedtime.) 90 tablet 4   predniSONE (DELTASONE) 20 MG tablet Take two tablets daily for 3 days followed by one tablet daily for 3 days 9 tablet 0   Propylene Glycol (SYSTANE BALANCE OP) Place 1 drop into both eyes 3 (three) times daily as needed (for dryness).     verapamil (CALAN-SR) 240 MG CR tablet Take 1 tablet (240 mg total) by mouth daily. (Patient taking differently: Take 240 mg by mouth at bedtime.) 90 tablet 4   vitamin B-12 (CYANOCOBALAMIN) 500 MCG tablet Take 1 tablet (500 mcg total) by mouth once a week. (Patient taking differently: Take 250 mcg by mouth every Thursday.)     No facility-administered medications prior to visit.     Per HPI unless specifically indicated in ROS section below Review of Systems  Objective:  BP (!) 158/80   Pulse 74   Temp 98 F (36.7 C) (Temporal)   Ht 5\' 1"  (1.549 m)   Wt 195 lb 3.2 oz (88.5 kg)   SpO2 98%   BMI 36.88 kg/m   Wt Readings from Last 3 Encounters:  08/19/23 195 lb 3.2 oz (88.5 kg)  08/06/23 201 lb 6 oz (91.3 kg)  05/19/23 201 lb 6 oz (91.3 kg)      Physical Exam Vitals and nursing note reviewed.  Constitutional:      Appearance: Normal appearance. She is not ill-appearing.     Comments: Ambulates with rollator   Musculoskeletal:        General: Tenderness present.     Right lower leg: No edema.     Left lower leg: No edema.     Comments:  + pain midline lumbar spine No paraspinous mm tenderness + seated SLR on right. No significant pain with int/ext rotation at hip. Discomfort to right sciatic notch as well as right greater trochanteric bursa.   Skin:  General: Skin is warm and dry.  Neurological:     Mental Status: She is alert.     Sensory: Sensation is intact.     Motor: Motor function is intact.     Coordination: Coordination is intact.     Comments:  5/5 strength BLE Sensation intact  Psychiatric:        Mood and Affect: Mood normal.        Behavior: Behavior normal.       Results for orders placed or performed in visit on 08/06/23  CK  Result Value Ref Range   Total CK 58 7 - 177 U/L  Renal function panel  Result Value Ref Range   Sodium 142 135 - 145 mEq/L   Potassium 3.5 3.5 - 5.1 mEq/L   Chloride 104 96 - 112 mEq/L   CO2 30 19 - 32 mEq/L   Albumin 3.8 3.5 - 5.2 g/dL   BUN 17 6 - 23 mg/dL   Creatinine, Ser 1.61 (H) 0.40 - 1.20 mg/dL   Glucose, Bld 86 70 - 99 mg/dL   Phosphorus 2.6 2.3 - 4.6 mg/dL   GFR 09.60 (L) >45.40 mL/min   Calcium 8.9 8.4 - 10.5 mg/dL    Assessment & Plan:   Problem List Items Addressed This Visit     Hypertension    BP markedly elevated again today. I asked her to monitor readings at home and let me know if consistently elevated >150/100 to titrate antihypertensives accordingly.  She continues hydralazine 25mg  BID, toprol XL 25mg  daily, verapamil CR 240mg  daily, lisinopril 40mg  daily, and now lasix 40mg  daily.       Osteoporosis    Received prolia injection 06/15/2023.      Chronic diastolic CHF (congestive heart failure) (HCC)    Seems to be doing better with lasix 40mg  daily in am vs 20mg  BID dosing. Continue this. Latest labs stable. Also now on potassium daily.       Bilateral lower extremity edema    This is better  since increasing lasix to 40mg  daily in am - continue higher dose.       Right leg pain - Primary    Exam today suspicious for lumbar radiculopathy - however didn't feel much better on steroid taper.  Anticipate also with component of greater trochanteric bursitis and knee osteoarthritis.  Lumbar films today reviewed - marked anterolisthesis L4 on L5, ?compression deformity at L2 - await formal radiology read.  Will refer to [facility] PM&R for further evaluation, recommendations.       Relevant Orders   DG Lumbar Spine Complete   Ambulatory referral to Physical Medicine Rehab     No orders of the defined types were placed in this encounter.   Orders Placed This Encounter  Procedures   DG Lumbar Spine Complete    Standing Status:   Future    Number of Occurrences:   1    Standing Expiration Date:   08/18/2024    Order Specific Question:   Reason for Exam (SYMPTOM  OR DIAGNOSIS REQUIRED)    Answer:   low back pain with R radiculopathy    Order Specific Question:   Preferred imaging location?    Answer:   [facility]   Ambulatory referral to Physical Medicine Rehab    Referral Priority:   Routine    Referral Type:   Rehabilitation    Referral Reason:   Specialty Services Required    Requested Specialty:   Physical Medicine  and Rehabilitation    Number of Visits Requested:   1    Patient Instructions  Continue current medicines including lasix 40mg  daily with potassium. I think right leg pain may be coming from your back.  Lower back xrays today We will refer you to spine shot doctor in [place] for further evaluation.   Follow up plan: Return if symptoms worsen or fail to improve.  Eustaquio Boyden, MD

## 2023-08-19 NOTE — Assessment & Plan Note (Addendum)
Exam today suspicious for lumbar radiculopathy - however didn't feel much better on steroid taper.  Anticipate also with component of greater trochanteric bursitis and knee osteoarthritis.  Lumbar films today reviewed - marked anterolisthesis L4 on L5, ?compression deformity at L2 - await formal radiology read.  Will refer to [facility] PM&R for further evaluation, recommendations.

## 2023-08-19 NOTE — Patient Instructions (Addendum)
Continue current medicines including lasix 40mg  daily with potassium. I think right leg pain may be coming from your back.  Lower back xrays today We will refer you to spine shot doctor in [place] for further evaluation.

## 2023-08-19 NOTE — Assessment & Plan Note (Addendum)
Seems to be doing better with lasix 40mg  daily in am vs 20mg  BID dosing. Continue this. Latest labs stable. Also now on potassium daily.

## 2023-08-22 DIAGNOSIS — M7071 Other bursitis of hip, right hip: Secondary | ICD-10-CM | POA: Diagnosis not present

## 2023-08-22 DIAGNOSIS — I890 Lymphedema, not elsewhere classified: Secondary | ICD-10-CM | POA: Diagnosis not present

## 2023-08-22 DIAGNOSIS — M810 Age-related osteoporosis without current pathological fracture: Secondary | ICD-10-CM | POA: Diagnosis not present

## 2023-08-22 DIAGNOSIS — I130 Hypertensive heart and chronic kidney disease with heart failure and stage 1 through stage 4 chronic kidney disease, or unspecified chronic kidney disease: Secondary | ICD-10-CM | POA: Diagnosis not present

## 2023-08-22 DIAGNOSIS — F331 Major depressive disorder, recurrent, moderate: Secondary | ICD-10-CM | POA: Diagnosis not present

## 2023-08-22 DIAGNOSIS — D631 Anemia in chronic kidney disease: Secondary | ICD-10-CM | POA: Diagnosis not present

## 2023-08-22 DIAGNOSIS — I5032 Chronic diastolic (congestive) heart failure: Secondary | ICD-10-CM | POA: Diagnosis not present

## 2023-08-22 DIAGNOSIS — N183 Chronic kidney disease, stage 3 unspecified: Secondary | ICD-10-CM | POA: Diagnosis not present

## 2023-08-22 DIAGNOSIS — E039 Hypothyroidism, unspecified: Secondary | ICD-10-CM | POA: Diagnosis not present

## 2023-08-28 DIAGNOSIS — M7071 Other bursitis of hip, right hip: Secondary | ICD-10-CM | POA: Diagnosis not present

## 2023-08-28 DIAGNOSIS — E039 Hypothyroidism, unspecified: Secondary | ICD-10-CM | POA: Diagnosis not present

## 2023-08-28 DIAGNOSIS — F331 Major depressive disorder, recurrent, moderate: Secondary | ICD-10-CM | POA: Diagnosis not present

## 2023-08-28 DIAGNOSIS — M810 Age-related osteoporosis without current pathological fracture: Secondary | ICD-10-CM | POA: Diagnosis not present

## 2023-08-28 DIAGNOSIS — N183 Chronic kidney disease, stage 3 unspecified: Secondary | ICD-10-CM | POA: Diagnosis not present

## 2023-08-28 DIAGNOSIS — I890 Lymphedema, not elsewhere classified: Secondary | ICD-10-CM | POA: Diagnosis not present

## 2023-08-28 DIAGNOSIS — I5032 Chronic diastolic (congestive) heart failure: Secondary | ICD-10-CM | POA: Diagnosis not present

## 2023-08-28 DIAGNOSIS — I130 Hypertensive heart and chronic kidney disease with heart failure and stage 1 through stage 4 chronic kidney disease, or unspecified chronic kidney disease: Secondary | ICD-10-CM | POA: Diagnosis not present

## 2023-08-28 DIAGNOSIS — D631 Anemia in chronic kidney disease: Secondary | ICD-10-CM | POA: Diagnosis not present

## 2023-08-29 ENCOUNTER — Other Ambulatory Visit: Payer: Self-pay | Admitting: Family Medicine

## 2023-08-29 ENCOUNTER — Encounter: Payer: Self-pay | Admitting: *Deleted

## 2023-08-29 DIAGNOSIS — M79604 Pain in right leg: Secondary | ICD-10-CM

## 2023-08-29 DIAGNOSIS — S32020A Wedge compression fracture of second lumbar vertebra, initial encounter for closed fracture: Secondary | ICD-10-CM

## 2023-08-29 DIAGNOSIS — M47816 Spondylosis without myelopathy or radiculopathy, lumbar region: Secondary | ICD-10-CM

## 2023-08-29 DIAGNOSIS — M4316 Spondylolisthesis, lumbar region: Secondary | ICD-10-CM

## 2023-09-04 ENCOUNTER — Ambulatory Visit: Payer: Medicare HMO | Admitting: Podiatry

## 2023-09-04 DIAGNOSIS — I130 Hypertensive heart and chronic kidney disease with heart failure and stage 1 through stage 4 chronic kidney disease, or unspecified chronic kidney disease: Secondary | ICD-10-CM | POA: Diagnosis not present

## 2023-09-04 DIAGNOSIS — I890 Lymphedema, not elsewhere classified: Secondary | ICD-10-CM | POA: Diagnosis not present

## 2023-09-04 DIAGNOSIS — I5032 Chronic diastolic (congestive) heart failure: Secondary | ICD-10-CM | POA: Diagnosis not present

## 2023-09-04 DIAGNOSIS — F331 Major depressive disorder, recurrent, moderate: Secondary | ICD-10-CM | POA: Diagnosis not present

## 2023-09-04 DIAGNOSIS — M810 Age-related osteoporosis without current pathological fracture: Secondary | ICD-10-CM | POA: Diagnosis not present

## 2023-09-04 DIAGNOSIS — M7071 Other bursitis of hip, right hip: Secondary | ICD-10-CM | POA: Diagnosis not present

## 2023-09-04 DIAGNOSIS — E039 Hypothyroidism, unspecified: Secondary | ICD-10-CM | POA: Diagnosis not present

## 2023-09-04 DIAGNOSIS — D631 Anemia in chronic kidney disease: Secondary | ICD-10-CM | POA: Diagnosis not present

## 2023-09-04 DIAGNOSIS — N183 Chronic kidney disease, stage 3 unspecified: Secondary | ICD-10-CM | POA: Diagnosis not present

## 2023-09-14 NOTE — Progress Notes (Unsigned)
Brittany Burroughs T. Branston Halsted, MD, CAQ Sports Medicine [facility] at [facility] [address] [place], [address]  Phone: [phone]  FAX: [phone]  [name] - 89 y.o. female  MRN [id number]  Date of Birth: Aug 07, 1930  Date: 09/17/2023  PCP: Eustaquio Boyden, MD  Referral: Eustaquio Boyden, MD  No chief complaint on file.  Subjective:   [name] is a 89 y.o. very pleasant female patient with There is no height or weight on file to calculate BMI. who presents with the following:  The patient presents for possible knee injection.    Review of Systems is noted in the HPI, as appropriate  Objective:   There were no vitals taken for this visit.  GEN: No acute distress; alert,appropriate. PULM: Breathing comfortably in no respiratory distress PSYCH: Normally interactive.   Laboratory and Imaging Data:  Assessment and Plan:   ***

## 2023-09-15 ENCOUNTER — Ambulatory Visit
Admission: RE | Admit: 2023-09-15 | Discharge: 2023-09-15 | Disposition: A | Discharge status: Home / Self Care | Payer: Medicare HMO | Source: Ambulatory Visit | Attending: Family Medicine | Admitting: Family Medicine

## 2023-09-15 DIAGNOSIS — S32020A Wedge compression fracture of second lumbar vertebra, initial encounter for closed fracture: Secondary | ICD-10-CM

## 2023-09-15 DIAGNOSIS — M48061 Spinal stenosis, lumbar region without neurogenic claudication: Secondary | ICD-10-CM | POA: Diagnosis not present

## 2023-09-15 DIAGNOSIS — M79604 Pain in right leg: Secondary | ICD-10-CM

## 2023-09-15 DIAGNOSIS — M4316 Spondylolisthesis, lumbar region: Secondary | ICD-10-CM

## 2023-09-15 DIAGNOSIS — M4856XA Collapsed vertebra, not elsewhere classified, lumbar region, initial encounter for fracture: Secondary | ICD-10-CM | POA: Diagnosis not present

## 2023-09-15 DIAGNOSIS — M545 Low back pain, unspecified: Secondary | ICD-10-CM | POA: Diagnosis not present

## 2023-09-15 DIAGNOSIS — M47816 Spondylosis without myelopathy or radiculopathy, lumbar region: Secondary | ICD-10-CM

## 2023-09-17 ENCOUNTER — Encounter: Payer: Self-pay | Admitting: Family Medicine

## 2023-09-17 ENCOUNTER — Ambulatory Visit (INDEPENDENT_AMBULATORY_CARE_PROVIDER_SITE_OTHER): Payer: Medicare HMO | Admitting: Family Medicine

## 2023-09-17 VITALS — BP 124/60 | HR 65 | Temp 99.0°F | Ht 61.0 in | Wt 190.0 lb

## 2023-09-17 DIAGNOSIS — M47816 Spondylosis without myelopathy or radiculopathy, lumbar region: Secondary | ICD-10-CM | POA: Diagnosis not present

## 2023-09-17 DIAGNOSIS — S32020A Wedge compression fracture of second lumbar vertebra, initial encounter for closed fracture: Secondary | ICD-10-CM | POA: Diagnosis not present

## 2023-09-17 DIAGNOSIS — Z23 Encounter for immunization: Secondary | ICD-10-CM | POA: Diagnosis not present

## 2023-09-17 DIAGNOSIS — M549 Dorsalgia, unspecified: Secondary | ICD-10-CM | POA: Diagnosis not present

## 2023-09-17 DIAGNOSIS — G8929 Other chronic pain: Secondary | ICD-10-CM

## 2023-09-18 DIAGNOSIS — F331 Major depressive disorder, recurrent, moderate: Secondary | ICD-10-CM | POA: Diagnosis not present

## 2023-09-18 DIAGNOSIS — I890 Lymphedema, not elsewhere classified: Secondary | ICD-10-CM | POA: Diagnosis not present

## 2023-09-18 DIAGNOSIS — M7071 Other bursitis of hip, right hip: Secondary | ICD-10-CM | POA: Diagnosis not present

## 2023-09-18 DIAGNOSIS — I5032 Chronic diastolic (congestive) heart failure: Secondary | ICD-10-CM | POA: Diagnosis not present

## 2023-09-18 DIAGNOSIS — N183 Chronic kidney disease, stage 3 unspecified: Secondary | ICD-10-CM | POA: Diagnosis not present

## 2023-09-18 DIAGNOSIS — I130 Hypertensive heart and chronic kidney disease with heart failure and stage 1 through stage 4 chronic kidney disease, or unspecified chronic kidney disease: Secondary | ICD-10-CM | POA: Diagnosis not present

## 2023-09-18 DIAGNOSIS — D631 Anemia in chronic kidney disease: Secondary | ICD-10-CM | POA: Diagnosis not present

## 2023-09-18 DIAGNOSIS — E039 Hypothyroidism, unspecified: Secondary | ICD-10-CM | POA: Diagnosis not present

## 2023-09-18 DIAGNOSIS — M810 Age-related osteoporosis without current pathological fracture: Secondary | ICD-10-CM | POA: Diagnosis not present

## 2023-09-19 ENCOUNTER — Encounter: Payer: Self-pay | Admitting: Physical Medicine & Rehabilitation

## 2023-09-23 ENCOUNTER — Encounter: Payer: Self-pay | Admitting: Podiatry

## 2023-09-23 ENCOUNTER — Ambulatory Visit (INDEPENDENT_AMBULATORY_CARE_PROVIDER_SITE_OTHER): Payer: Medicare HMO | Admitting: Podiatry

## 2023-09-23 DIAGNOSIS — M79675 Pain in left toe(s): Secondary | ICD-10-CM | POA: Diagnosis not present

## 2023-09-23 DIAGNOSIS — B351 Tinea unguium: Secondary | ICD-10-CM

## 2023-09-23 DIAGNOSIS — N1832 Chronic kidney disease, stage 3b: Secondary | ICD-10-CM

## 2023-09-23 DIAGNOSIS — M79674 Pain in right toe(s): Secondary | ICD-10-CM

## 2023-09-23 NOTE — Progress Notes (Signed)
This patient returns to my office for at risk foot care.  This patient requires this care by a professional since this patient will be at risk due to having  CKD.  Patient presents to the office with her daughter.  This patient is unable to cut nails herself since the patient cannot reach hiernails.These nails are painful walking and wearing shoes.  This patient presents for at risk foot care today.  General Appearance  Alert, conversant and in no acute stress.  Vascular  Dorsalis pedis and posterior tibial  pulses are weakly  palpable  bilaterally.  Capillary return is within normal limits  bilaterally. Temperature is within normal limits  bilaterally.  Neurologic  Senn-Weinstein monofilament wire test within normal limits  bilaterally. Muscle power within normal limits bilaterally.  Nails Thick disfigured discolored nails with subungual debris  from hallux to fifth toes bilaterally. No evidence of bacterial infection or drainage bilaterally.  Orthopedic  No limitations of motion  feet .  No crepitus or effusions noted.  No bony pathology or digital deformities noted.  Skin  normotropic skin with no porokeratosis noted bilaterally.  No signs of infections or ulcers noted.     Onychomycosis  Pain in right toes  Pain in left toes  Consent was obtained for treatment procedures.   Mechanical debridement of nails 1-5  bilaterally performed with a nail nipper.  Filed with dremel without incident.   Cauterize fourth toe right foot.   Return office visit    3 months                  Told patient to return for periodic foot care and evaluation due to potential at risk complications.   Helane Gunther DPM

## 2023-10-06 ENCOUNTER — Encounter: Payer: Self-pay | Admitting: Family Medicine

## 2023-10-06 ENCOUNTER — Other Ambulatory Visit: Payer: Self-pay | Admitting: Family Medicine

## 2023-10-06 DIAGNOSIS — S32020A Wedge compression fracture of second lumbar vertebra, initial encounter for closed fracture: Secondary | ICD-10-CM

## 2023-10-06 DIAGNOSIS — M4316 Spondylolisthesis, lumbar region: Secondary | ICD-10-CM

## 2023-10-06 DIAGNOSIS — G8929 Other chronic pain: Secondary | ICD-10-CM

## 2023-10-08 DIAGNOSIS — I5032 Chronic diastolic (congestive) heart failure: Secondary | ICD-10-CM | POA: Diagnosis not present

## 2023-10-08 DIAGNOSIS — N183 Chronic kidney disease, stage 3 unspecified: Secondary | ICD-10-CM | POA: Diagnosis not present

## 2023-10-08 DIAGNOSIS — M7071 Other bursitis of hip, right hip: Secondary | ICD-10-CM | POA: Diagnosis not present

## 2023-10-08 DIAGNOSIS — F331 Major depressive disorder, recurrent, moderate: Secondary | ICD-10-CM | POA: Diagnosis not present

## 2023-10-08 DIAGNOSIS — I130 Hypertensive heart and chronic kidney disease with heart failure and stage 1 through stage 4 chronic kidney disease, or unspecified chronic kidney disease: Secondary | ICD-10-CM | POA: Diagnosis not present

## 2023-10-08 DIAGNOSIS — I890 Lymphedema, not elsewhere classified: Secondary | ICD-10-CM | POA: Diagnosis not present

## 2023-10-08 DIAGNOSIS — E039 Hypothyroidism, unspecified: Secondary | ICD-10-CM | POA: Diagnosis not present

## 2023-10-08 DIAGNOSIS — D631 Anemia in chronic kidney disease: Secondary | ICD-10-CM | POA: Diagnosis not present

## 2023-10-08 DIAGNOSIS — M810 Age-related osteoporosis without current pathological fracture: Secondary | ICD-10-CM | POA: Diagnosis not present

## 2023-10-16 DIAGNOSIS — D631 Anemia in chronic kidney disease: Secondary | ICD-10-CM | POA: Diagnosis not present

## 2023-10-16 DIAGNOSIS — I890 Lymphedema, not elsewhere classified: Secondary | ICD-10-CM | POA: Diagnosis not present

## 2023-10-16 DIAGNOSIS — I130 Hypertensive heart and chronic kidney disease with heart failure and stage 1 through stage 4 chronic kidney disease, or unspecified chronic kidney disease: Secondary | ICD-10-CM | POA: Diagnosis not present

## 2023-10-16 DIAGNOSIS — I5032 Chronic diastolic (congestive) heart failure: Secondary | ICD-10-CM | POA: Diagnosis not present

## 2023-10-16 DIAGNOSIS — M7071 Other bursitis of hip, right hip: Secondary | ICD-10-CM | POA: Diagnosis not present

## 2023-10-16 DIAGNOSIS — F331 Major depressive disorder, recurrent, moderate: Secondary | ICD-10-CM | POA: Diagnosis not present

## 2023-10-16 DIAGNOSIS — N183 Chronic kidney disease, stage 3 unspecified: Secondary | ICD-10-CM | POA: Diagnosis not present

## 2023-10-16 DIAGNOSIS — E039 Hypothyroidism, unspecified: Secondary | ICD-10-CM | POA: Diagnosis not present

## 2023-10-16 DIAGNOSIS — M810 Age-related osteoporosis without current pathological fracture: Secondary | ICD-10-CM | POA: Diagnosis not present

## 2023-10-23 ENCOUNTER — Encounter: Payer: Self-pay | Admitting: Physical Medicine & Rehabilitation

## 2023-10-23 ENCOUNTER — Encounter: Payer: Medicare HMO | Attending: Physical Medicine & Rehabilitation | Admitting: Physical Medicine & Rehabilitation

## 2023-10-23 VITALS — BP 130/71 | HR 68 | Ht 61.0 in | Wt 197.0 lb

## 2023-10-23 DIAGNOSIS — M5416 Radiculopathy, lumbar region: Secondary | ICD-10-CM | POA: Diagnosis not present

## 2023-10-23 DIAGNOSIS — M48061 Spinal stenosis, lumbar region without neurogenic claudication: Secondary | ICD-10-CM | POA: Insufficient documentation

## 2023-10-23 NOTE — Progress Notes (Signed)
Subjective:    Patient ID: [name], female    DOB: [birth year], 89 y.o.   MRN: [id number]  HPI  89 yo female with >2month hx of RLE pain mainly anterior thigh , referred by primary care physician for physical medicine and rehabilitation evaluation.  The patient states that she has severe pain that is intermittent and aching.  Her sleep is generally good her pain is worse with standing.  She uses a walker to ambulate.  She lives alone but needs assistance for shopping as well as bathing.  She does not do any regular exercise program We reviewed the MRI findings with the patient as well as the great granddaughter who is with the patient today.  We discussed the multilevel severe spinal stenosis and the difficulty achieving therapeutic success.  We also discussed fall risk given her impaired mobility as well as age and the influence of not only narcotic analgesics but medications for neuropathic pain and muscle relaxants. She is currently not taking any of this class of medications.  She has not tried Tylenol thus far.  MRI LUMBAR SPINE WITHOUT CONTRAST   TECHNIQUE: Multiplanar, multisequence MR imaging of the lumbar spine was performed. No intravenous contrast was administered.   COMPARISON:  Lumbar radiographs 08/19/2023.   FINDINGS: Segmentation:  Normal, concordant with the August numbering.   Alignment: Exaggerated lumbar lordosis, stable. Anterolisthesis of L4 on L5 is 7 mm, grade 1. Lesser grade 1 anterolisthesis of L3 on L4. Mild retrolisthesis from T12-L1 through L2-L3.   Vertebrae: L2 compression fracture, 50% loss of height. Confluent vertebral body marrow edema there. Left L2 pedicle marrow edema in association with vertical fracture near the anterior junction of the left pedicle and body on series 103, image 9. Otherwise the L2 posterior elements appear intact and aligned. Mild retropulsion, see L1-L2 and L2-L3 details below.   Chronic T12 compression fracture with  less pronounced loss of height, superimposed T12 superior endplate Schmorl's node, and also a right T12 benign vertebral body hemangioma.   Partially visible benign hemangioma is also at the T9 and T10 thoracic levels. Background bone marrow signal within normal limits for age. Intact visible sacrum and SI joints.   Conus medullaris and cauda equina: Conus extends to the L1-L2, see details of that level below.   However,  No lower spinal cord or conus signal abnormality.   Paraspinal and other soft tissues: Negative for age visible abdominal viscera. Patchy bilateral lumbar rect or spine I muscle edema is mild (e.g. Series 105, image 11 on the left). No paraspinal fluid collection.   Disc levels:   Lower thoracic spine degeneration including disc bulging and posterior element hypertrophy. But no thoracic spinal stenosis T9-T10 through T11-T12.   T12-L1: Mild retropulsion and retrolisthesis superimposed on chronic T12 compression. Circumferential disc bulging and up to moderate ligament flavum, mild facet hypertrophy. Mild spinal stenosis here at the upper conus (series 106, image 5) without cord/conus mass effect. Mild left and mild to moderate right T12 foraminal stenosis.   L1-L2: Moderate multifactorial spinal stenosis here at the tip of the conus with conus mass effect (series 106, image 11). Circumferential disc bulging and moderate to severe ligament flavum, moderate facet hypertrophy superimposed on retrolisthesis and retropulsion. Mild left but moderate to severe right L1 neural foraminal stenosis.   L2-L3: Moderate to severe multifactorial spinal stenosis (series 103, image 7 and series 106 image 16) with circumferential disc bulge and moderate to severe bulky facet and ligament flavum hypertrophy superimposed  on mild retropulsion and retrolisthesis. Moderate left and severe right L2 neural foraminal stenosis.   L3-L4: Mild to moderate multifactorial spinal  stenosis with grade 1 anterolisthesis. Circumferential disc/pseudo disc eccentric to the right. Moderate to severe facet and ligament flavum hypertrophy. Severe right lateral recess stenosis (right L4 nerve level). Moderate left and severe right L3 neural foraminal stenosis.   L4-L5: Severe or Very Severe spinal and bilateral lateral recess stenosis (series 106, images 28 and 29) in the setting of substantial grade 1 spondylolisthesis with circumferential disc/pseudo disc and severe posterior element hypertrophy. Moderate left and mild to moderate right L4 neural foraminal stenosis.   L5-S1: Mild circumferential disc bulge and endplate spurring. Moderate facet and ligament flavum hypertrophy. No significant spinal stenosis. Mild lateral recess stenosis (S1 nerve levels). No L5 foraminal stenosis.   IMPRESSION: 1. Subacute L2 compression fracture with ongoing marrow edema. 50% loss of height, and mild retropulsion which contributes to multifactorial spinal and foraminal stenosis at both L1-L2 and L2-L3, involving the conus medullaris (see #4).   2. No acute osseous abnormality. Chronic T12 compression fracture with less pronounced loss of height and retropulsion. Underlying widespread lumbar Spondylolisthesis (worst at L4-L5, see #3) with exaggerated lumbar lordosis.   3. Very Severe multifactorial spinal stenosis at L4-L5. Severe lateral recess stenosis (L5 nerve levels) and mild to moderate L4 foraminal stenosis.   4. Multifactorial spinal stenosis also at L1-L2 (Moderate) and L2-L3 (Moderate to Severe). Mass effect on the conus medullaris but no conus signal abnormality. Severe right side L1, L2, and L3 foraminal stenosis. And up to moderate left side foraminal stenosis at the same levels.     Electronically Signed   By: Odessa Fleming M.D.   On: 10/04/2023 09:14   Pain Inventory Average Pain 10 Pain Right Now 0 My pain is intermittent and aching  In the last 24 hours, has  pain interfered with the following? General activity 0 Relation with others 0 Enjoyment of life 0 What TIME of day is your pain at its worst? varies Sleep (in general) Good  Pain is worse with: standing and some activites Pain improves with: rest and medication Relief from Meds: 3  use a walker  retired I need assistance with the following:  shopping  trouble walking  Any changes since last visit?  no  Any changes since last visit?  no    Family History  Problem Relation Age of Onset   Cancer Mother 71       lung, nonsmoker   Cancer Father        lung, nonsmoker   Cancer Brother 96       lung, smoker   Cancer Sister 61       brain   Cancer Sister 45       lung, smoker   COPD Sister        smoker   Cancer Sister 41       colon   Hypertension Daughter    Diabetes Daughter    CAD Neg Hx    Stroke Neg Hx    Social History   Socioeconomic History   Marital status: Widowed    Spouse name: Not on file   Number of children: Not on file   Years of education: Not on file   Highest education level: Not on file  Occupational History   Not on file  Tobacco Use   Smoking status: Former    Current packs/day: 0.00    Types: Cigarettes  Quit date: 12/30/1992    Years since quitting: 30.8   Smokeless tobacco: Never  Vaping Use   Vaping status: Never Used  Substance and Sexual Activity   Alcohol use: No   Drug use: No   Sexual activity: Not Currently  Other Topics Concern   Not on file  Social History Narrative   Widower since 12-19-1999 - husband battled cancer for 6 yrs, died from MI   Daughter Ciearra Leifer) passed away 12/19/15   Lies in retirement home - [facility] on [address].   Occupation: retired   HS: 12th grade   Activity: no regular exercise   Diet: good water, fruits/vegetables daily   Social Determinants of Health   Financial Resource Strain: Low Risk  (04/19/2022)   Overall Financial Resource Strain (CARDIA)    Difficulty of Paying Living Expenses:  Not hard at all  Food Insecurity: No Food Insecurity (08/27/2022)   Hunger Vital Sign    Worried About Running Out of Food in the Last Year: Never true    Ran Out of Food in the Last Year: Never true  Transportation Needs: No Transportation Needs (08/27/2022)   PRAPARE - Administrator, Civil Service (Medical): No    Lack of Transportation (Non-Medical): No  Physical Activity: Insufficiently Active (04/19/2022)   Exercise Vital Sign    Days of Exercise per Week: 2 days    Minutes of Exercise per Session: 50 min  Stress: No Stress Concern Present (04/19/2022)   [employer] of Occupational Health - Occupational Stress Questionnaire    Feeling of Stress : Not at all  Social Connections: Not on file   Past Surgical History:  Procedure Laterality Date   APPENDECTOMY  1974   BACK SURGERY  1979   BIOPSY  03/13/2020   Procedure: BIOPSY;  Surgeon: Meryl Dare, MD;  Location: [facility] ENDOSCOPY;  Service: Endoscopy;;   CARDIOVASCULAR STRESS TEST  2010   WNL   CARPAL TUNNEL RELEASE Left 04/28/2017   CATARACT EXTRACTION Bilateral 2011/12/19   COLONOSCOPY WITH PROPOFOL N/A 03/14/2020   single bleeding colonic angiectasia treated with APC Rhea Belton, Carie Caddy, MD)   DEXA  03/2016   at [facility] - T -3.1 hip, -1.0 spine   ESOPHAGOGASTRODUODENOSCOPY (EGD) WITH PROPOFOL N/A 03/13/2020   erosive gastropathy with stigmata of recent bleeding Meryl Dare, MD)   GALLBLADDER SURGERY  1974   HOT HEMOSTASIS N/A 03/14/2020   Procedure: HOT HEMOSTASIS (ARGON PLASMA COAGULATION/BICAP);  Surgeon: Beverley Fiedler, MD;  Location[facility] ENDOSCOPY;  Service: Gastroenterology;  Laterality: N/A;   KNEE SURGERY  1987   right   KNEE SURGERY  1998   left   KNEE SURGERY  12-19-07   POLYPECTOMY  03/14/2020   Procedure: POLYPECTOMY;  Surgeon: Beverley Fiedler, MD;  Location: [facility] ENDOSCOPY;  Service: Gastroenterology;;   TONSILLECTOMY  1945   US ECHOCARDIOGRAPHY  12/18/09   LVEF 65-75%, overall normal, mildly decreased LV diastolic  compliance   Past Medical History:  Diagnosis Date   Acute upper GI bleeding 03/12/2020   Arthritis    COVID-19 virus infection 12/14/2021   Depression    History of anemia    History of chicken pox    History of ulcer disease 1990s   HLD (hyperlipidemia)    HTN (hypertension)    Hypothyroidism    Osteoporosis 03/2016   at [facility] - T -3.1 hip, -1.0 spine   Uses hearing aid    Ht 5\' 1"  (1.549 m)   Wt  197 lb (89.4 kg)   BMI 37.22 kg/m   Opioid Risk Score:   Fall Risk Score:  `1  Depression screen [facility] 2/9     08/06/2023   10:38 AM 02/10/2023   11:55 AM 01/21/2023    2:18 PM 04/19/2022   10:58 AM 10/11/2020    3:07 PM 03/03/2020    3:44 PM 11/09/2019   11:09 AM  Depression screen PHQ 2/9  Decreased Interest 1 2 1  0 3 1 2   Down, Depressed, Hopeless 1 1 1  0 2 2 3   PHQ - 2 Score 2 3 2  0 5 3 5   Altered sleeping 1 1 0  2 3 0  Tired, decreased energy 1 0 2  1 2 3   Change in appetite 1 0 2  3 0 3  Feeling bad or failure about yourself  0 0 1  1 0 3  Trouble concentrating 0 0 1  3 1 1   Moving slowly or fidgety/restless 0 0 1  2 1  0  Suicidal thoughts 0 0 0  0 0 0  PHQ-9 Score 5 4 9  17 10 15   Difficult doing work/chores Somewhat difficult Not difficult at all           Review of Systems  Musculoskeletal:  Positive for gait problem.  All other systems reviewed and are negative.     Objective:   Physical Exam Kyphotic appearing elderly female in no acute distress Mood and affect are appropriate Answers questions appropriately, good medical and personal history historian. General acute distress Lumbar spine has mild tenderness palpation the paraspinal area right greater left side. The patient has 50% range with lumbar flexion and less than 25% range with extension as well as lateral bending. Negative straight leg raising bilaterally Right anterior thigh has reduced sensation to light touch compared to left side.  Intact sensation right lateral thigh as well as right medial  thigh.  Mildly reduced sensation on the right patellar area compared to the left patellar area Bilateral knees show evidence of healed TKR incisions Lower extremity edema 2+ bilaterally Ambulates with a walker short step length widened base of support Motor strength is 4/5 bilateral hip flexor knee extensor ankle dorsiflexor.      Assessment & Plan:  1.  Lumbar spinal stenosis central stenotic canal stenosis severe at L4-5 with severe right L1-2 L2-3 and L3-4 foraminal stenosis The patient's primary complaint is right lower extremity pain.  We discussed that the most likely symptomatic level would be the right L3-4, i.e. right L3 nerve root.  We discussed that will be difficult to predict efficacy as well as duration of effect for epidural injection.  Given her age she is likely not a good surgical candidate. The patient has failed home health physical therapy between June and August 2024.  She is not a good candidate for medication management giving her her age and multiple medical comorbidities including GI bleed as well as osteoporosis.

## 2023-10-23 NOTE — Patient Instructions (Signed)
Plan RIght L3 epidural steroid injection ,  given severity of nerve compression difficult to estimate degree of pain relief or duration .

## 2023-12-04 ENCOUNTER — Encounter: Payer: Medicare HMO | Attending: Physical Medicine & Rehabilitation | Admitting: Physical Medicine & Rehabilitation

## 2023-12-04 ENCOUNTER — Encounter: Payer: Self-pay | Admitting: Physical Medicine & Rehabilitation

## 2023-12-04 VITALS — BP 150/72 | HR 71 | Ht 61.0 in | Wt 189.4 lb

## 2023-12-04 DIAGNOSIS — M5416 Radiculopathy, lumbar region: Secondary | ICD-10-CM | POA: Diagnosis not present

## 2023-12-04 DIAGNOSIS — M48061 Spinal stenosis, lumbar region without neurogenic claudication: Secondary | ICD-10-CM | POA: Insufficient documentation

## 2023-12-04 MED ORDER — DEXAMETHASONE SODIUM PHOSPHATE 10 MG/ML IJ SOLN
10.0000 mg | Freq: Once | INTRAMUSCULAR | Status: AC
Start: 2023-12-04 — End: 2023-12-04
  Administered 2023-12-04: 10 mg

## 2023-12-04 MED ORDER — LIDOCAINE HCL 1 % IJ SOLN
5.0000 mL | Freq: Once | INTRAMUSCULAR | Status: AC
Start: 2023-12-04 — End: 2023-12-04
  Administered 2023-12-04: 5 mL

## 2023-12-04 MED ORDER — IOHEXOL 180 MG/ML  SOLN
3.0000 mL | Freq: Once | INTRAMUSCULAR | Status: AC
  Administered 2023-12-04: 3 mL

## 2023-12-04 MED ORDER — LIDOCAINE HCL (PF) 1 % IJ SOLN
2.0000 mL | Freq: Once | INTRAMUSCULAR | Status: AC
Start: 2023-12-04 — End: 2023-12-04
  Administered 2023-12-04: 2 mL

## 2023-12-04 NOTE — Patient Instructions (Signed)

## 2023-12-04 NOTE — Progress Notes (Signed)
  PROCEDURE RECORD [facility]   Name: [name] DOB:October 07, 1930 MRN: [id number]  Date:12/04/2023  Physician: Claudette Laws, MD    Nurse/CMA: Kelli Churn RN  Allergies:  Allergies  Allergen Reactions   Trazodone And Nefazodone Other (See Comments)    "Worked against the patient"    Consent Signed: Yes.    Is patient diabetic? No.  CBG today?   Pregnant: No. LMP: No LMP recorded. Patient is postmenopausal. (age [age])  Anticoagulants: no Anti-inflammatory: no Antibiotics: no  Procedure: RIGHT L3 TRANSFORAMINAL EPIDURAL STEROID INJECTION  Position: Prone Start Time: 303 p  End Time: 312p  Fluoro Time: 49 sec  RN/CMA Marlow Berenguer RN Casin Federici RN    Time 2:38 318 p    BP 150/72 164/77    Pulse 71 70    Respirations 16 16    O2 Sat 96 97    S/S 6 6    Pain Level 8/10 4/10     D/C home with daughter, patient A & O X 3, D/C instructions reviewed, and sits independently.

## 2023-12-04 NOTE — Progress Notes (Signed)
R L3-4 Lumbar transforaminal epidural steroid injection under fluoroscopic guidance with contrast enhancement  Indication: Lumbosacral radiculitis is not relieved by medication management or other conservative care and interfering with self-care and mobility.   Informed consent was obtained after describing risk and benefits of the procedure with the patient, this includes bleeding, bruising, infection, paralysis and medication side effects.  The patient wishes to proceed and has given written consent.  Patient was placed in prone position.  The lumbar area was marked and prepped with Betadine.  It was entered with a 25-gauge 1-1/2 inch needle and one mL of 1% lidocaine was injected into the skin and subcutaneous tissue.  Then a 22-gauge 5" spinal needle was inserted into the Right L3-4  intervertebral foramen under AP, lateral, and oblique view.  Once needle tip was within the foramen on lateral views an dnor exceeding 6 o clock position on th epedical on AP viewed Isovue 200 was inected x 2ml Then a solution containing one mL of 10 mg per mL dexamethasone and 2 mL of 1% lidocaine was injected.  The patient tolerated procedure well.  Post procedure instructions were given.  Please see post procedure form.

## 2023-12-22 ENCOUNTER — Encounter: Payer: Self-pay | Admitting: Podiatry

## 2023-12-22 ENCOUNTER — Ambulatory Visit: Payer: Medicare HMO | Admitting: Podiatry

## 2023-12-22 VITALS — Ht 61.0 in | Wt 189.4 lb

## 2023-12-22 DIAGNOSIS — M79675 Pain in left toe(s): Secondary | ICD-10-CM

## 2023-12-22 DIAGNOSIS — M79674 Pain in right toe(s): Secondary | ICD-10-CM | POA: Diagnosis not present

## 2023-12-22 DIAGNOSIS — N1832 Chronic kidney disease, stage 3b: Secondary | ICD-10-CM

## 2023-12-22 DIAGNOSIS — B351 Tinea unguium: Secondary | ICD-10-CM

## 2023-12-22 NOTE — Progress Notes (Signed)
This patient returns to my office for at risk foot care.  This patient requires this care by a professional since this patient will be at risk due to having  CKD.  Patient presents to the office with her daughter.  This patient is unable to cut nails herself since the patient cannot reach hiernails.These nails are painful walking and wearing shoes.  This patient presents for at risk foot care today.  General Appearance  Alert, conversant and in no acute stress.  Vascular  Dorsalis pedis and posterior tibial  pulses are weakly  palpable  bilaterally.  Capillary return is within normal limits  bilaterally. Temperature is within normal limits  bilaterally.  Neurologic  Senn-Weinstein monofilament wire test within normal limits  bilaterally. Muscle power within normal limits bilaterally.  Nails Thick disfigured discolored nails with subungual debris  from hallux to fifth toes bilaterally. No evidence of bacterial infection or drainage bilaterally.  Orthopedic  No limitations of motion  feet .  No crepitus or effusions noted.  Hallux nails are shortened.  Skin  normotropic skin with no porokeratosis noted bilaterally.  No signs of infections or ulcers noted.     Onychomycosis  Pain in right toes  Pain in left toes  Consent was obtained for treatment procedures.   Mechanical debridement of nails 1-5  bilaterally performed with a nail nipper.  Filed with dremel without incident.   Cauterize fourth toe right foot.   Return office visit    3 months                  Told patient to return for periodic foot care and evaluation due to potential at risk complications.   Helane Gunther DPM

## 2024-01-15 ENCOUNTER — Ambulatory Visit: Payer: Medicare HMO | Admitting: Physical Medicine & Rehabilitation

## 2024-01-23 ENCOUNTER — Encounter: Payer: Self-pay | Admitting: Physical Medicine & Rehabilitation

## 2024-01-23 ENCOUNTER — Encounter: Payer: Medicare HMO | Attending: Physical Medicine & Rehabilitation | Admitting: Physical Medicine & Rehabilitation

## 2024-01-23 VITALS — BP 119/74 | HR 71 | Ht 61.0 in | Wt 191.0 lb

## 2024-01-23 DIAGNOSIS — M5416 Radiculopathy, lumbar region: Secondary | ICD-10-CM | POA: Diagnosis not present

## 2024-01-23 DIAGNOSIS — M48061 Spinal stenosis, lumbar region without neurogenic claudication: Secondary | ICD-10-CM | POA: Diagnosis not present

## 2024-01-23 NOTE — Progress Notes (Signed)
Subjective:    Patient ID: [name], female    DOB: December 23, 1930, 89 y.o.   MRN: [id number] 89 yo female with >5month hx of RLE pain mainly anterior thigh , referred by primary care physician for physical medicine and rehabilitation evaluation.  The patient states that she has severe pain that is intermittent and aching.  Her sleep is generally good her pain is worse with standing.  She uses a walker to ambulate.  She lives alone but needs assistance for shopping as well as bathing.  She does not do any regular exercise program We reviewed the MRI findings with the patient as well as the great granddaughter who is with the patient today.  We discussed the multilevel severe spinal stenosis and the difficulty achieving therapeutic success.  We also discussed fall risk given her impaired mobility as well as age and the influence of not only narcotic analgesics but medications for neuropathic pain and muscle relaxants.   12/04/23  R L3-4 Lumbar transforaminal epidural steroid injection under fluoroscopic guidance with contrast enhancement    HPI  89 year old female with severe multilevel lumbar spinal stenosis as well as osteoporosis.  She lives in a independent living apartment in a senior living center.  Family brings her groceries.  Patient is able to dress herself she does sponge bathing mainly.  She uses a walker to ambulate. She states she is having some problems with her memory.  She feels like the epidural injection was helpful.  She states she only has pain going down the right leg and not the left leg. No falls or new issues recently.  Pain Inventory Average Pain 8 Pain Right Now 7 My pain is intermittent, sharp, burning, dull, and aching  In the last 24 hours, has pain interfered with the following? General activity  a lot Relation with others 3 Enjoyment of life 0 What TIME of day is your pain at its worst? night Sleep (in general)  good with Tylenol  Pain is worse with:  walking, standing, and some activites Pain improves with: rest, heat/ice, medication, and injections Relief from Meds: A lot of pain relief sometimes.  Family History  Problem Relation Age of Onset   Cancer Mother 57       lung, nonsmoker   Cancer Father        lung, nonsmoker   Cancer Sister 105       brain   Cancer Sister 5       lung, smoker   COPD Sister        smoker   Cancer Sister 67       colon   Cancer Brother 55       lung, smoker   Hypertension Daughter    Diabetes Daughter    CAD Neg Hx    Stroke Neg Hx    Social History   Socioeconomic History   Marital status: Widowed    Spouse name: Not on file   Number of children: Not on file   Years of education: Not on file   Highest education level: Not on file  Occupational History   Not on file  Tobacco Use   Smoking status: Former    Current packs/day: 0.00    Types: Cigarettes    Quit date: 12/30/1992    Years since quitting: 31.0   Smokeless tobacco: Never  Vaping Use   Vaping status: Never Used  Substance and Sexual Activity   Alcohol use: No   Drug use:  No   Sexual activity: Not Currently  Other Topics Concern   Not on file  Social History Narrative   Widower since 04-Mar-1999 - husband battled cancer for 6 yrs, died from MI   Daughter Sherissa Tenenbaum) passed away 03/04/15   Lies in retirement home - [facility] on [address].   Occupation: retired   HS: 12th grade   Activity: no regular exercise   Diet: good water, fruits/vegetables daily   Social Drivers of Corporate investment banker Strain: Low Risk  (04/19/2022)   Overall Financial Resource Strain (CARDIA)    Difficulty of Paying Living Expenses: Not hard at all  Food Insecurity: No Food Insecurity (08/27/2022)   Hunger Vital Sign    Worried About Running Out of Food in the Last Year: Never true    Ran Out of Food in the Last Year: Never true  Transportation Needs: No Transportation Needs (08/27/2022)   PRAPARE - Administrator, Civil Service  (Medical): No    Lack of Transportation (Non-Medical): No  Physical Activity: Insufficiently Active (04/19/2022)   Exercise Vital Sign    Days of Exercise per Week: 2 days    Minutes of Exercise per Session: 50 min  Stress: No Stress Concern Present (04/19/2022)   [employer] of Occupational Health - Occupational Stress Questionnaire    Feeling of Stress : Not at all  Social Connections: Not on file   Past Surgical History:  Procedure Laterality Date   APPENDECTOMY  1974   BACK SURGERY  1979   BIOPSY  03/13/2020   Procedure: BIOPSY;  Surgeon: Meryl Dare, MD;  Location: [facility] ENDOSCOPY;  Service: Endoscopy;;   CARDIOVASCULAR STRESS TEST  2010   WNL   CARPAL TUNNEL RELEASE Left 04/28/2017   CATARACT EXTRACTION Bilateral Mar 04, 2011   COLONOSCOPY WITH PROPOFOL N/A 03/14/2020   single bleeding colonic angiectasia treated with APC Rhea Belton, Carie Caddy, MD)   DEXA  03/2016   at [facility] - T -3.1 hip, -1.0 spine   ESOPHAGOGASTRODUODENOSCOPY (EGD) WITH PROPOFOL N/A 03/13/2020   erosive gastropathy with stigmata of recent bleeding Meryl Dare, MD)   GALLBLADDER SURGERY  1974   HOT HEMOSTASIS N/A 03/14/2020   Procedure: HOT HEMOSTASIS (ARGON PLASMA COAGULATION/BICAP);  Surgeon: Beverley Fiedler, MD;  Location: [facility] ENDOSCOPY;  Service: Gastroenterology;  Laterality: N/A;   KNEE SURGERY  1987   right   KNEE SURGERY  1998   left   KNEE SURGERY  03/04/07   POLYPECTOMY  03/14/2020   Procedure: POLYPECTOMY;  Surgeon: Beverley Fiedler, MD;  Location: [facility] ENDOSCOPY;  Service: Gastroenterology;;   TONSILLECTOMY  1945   US ECHOCARDIOGRAPHY  2010   LVEF 65-75%, overall normal, mildly decreased LV diastolic compliance   Past Surgical History:  Procedure Laterality Date   APPENDECTOMY  1974   BACK SURGERY  1979   BIOPSY  03/13/2020   Procedure: BIOPSY;  Surgeon: Meryl Dare, MD;  Location: [facility] ENDOSCOPY;  Service: Endoscopy;;   CARDIOVASCULAR STRESS TEST  2010   WNL   CARPAL TUNNEL RELEASE Left 04/28/2017    CATARACT EXTRACTION Bilateral 04-Mar-2011   COLONOSCOPY WITH PROPOFOL N/A 03/14/2020   single bleeding colonic angiectasia treated with APC Rhea Belton, Carie Caddy, MD)   DEXA  03/2016   at [facility] - T -3.1 hip, -1.0 spine   ESOPHAGOGASTRODUODENOSCOPY (EGD) WITH PROPOFOL N/A 03/13/2020   erosive gastropathy with stigmata of recent bleeding Russella Dar, Venita Lick, MD)   GALLBLADDER SURGERY  1974   HOT HEMOSTASIS  N/A 03/14/2020   Procedure: HOT HEMOSTASIS (ARGON PLASMA COAGULATION/BICAP);  Surgeon: Beverley Fiedler, MD;  Location: [facility] ENDOSCOPY;  Service: Gastroenterology;  Laterality: N/A;   KNEE SURGERY  1987   right   KNEE SURGERY  1998   left   KNEE SURGERY  2008   POLYPECTOMY  03/14/2020   Procedure: POLYPECTOMY;  Surgeon: Beverley Fiedler, MD;  Location: [facility] ENDOSCOPY;  Service: Gastroenterology;;   TONSILLECTOMY  1945   US ECHOCARDIOGRAPHY  2010   LVEF 65-75%, overall normal, mildly decreased LV diastolic compliance   Past Medical History:  Diagnosis Date   Acute upper GI bleeding 03/12/2020   Arthritis    COVID-19 virus infection 12/14/2021   Depression    History of anemia    History of chicken pox    History of ulcer disease 1990s   HLD (hyperlipidemia)    HTN (hypertension)    Hypothyroidism    Osteoporosis 03/2016   at [facility] - T -3.1 hip, -1.0 spine   Uses hearing aid    Ht 5\' 1"  (1.549 m)   Wt 191 lb (86.6 kg)   BMI 36.09 kg/m   Opioid Risk Score:   Fall Risk Score:  `1  Depression screen [employer] 2/9     01/23/2024    2:55 PM 12/04/2023    2:41 PM 10/23/2023    1:13 PM 08/06/2023   10:38 AM 02/10/2023   11:55 AM 01/21/2023    2:18 PM 04/19/2022   10:58 AM  Depression screen PHQ 2/9  Decreased Interest 1 0 3 1 2 1  0  Down, Depressed, Hopeless 1 0 2 1 1 1  0  PHQ - 2 Score 2 0 5 2 3 2  0  Altered sleeping   2 1 1  0   Tired, decreased energy   3 1 0 2   Change in appetite   2 1 0 2   Feeling bad or failure about yourself    3 0 0 1   Trouble concentrating   2 0 0 1   Moving slowly or  fidgety/restless   0 0 0 1   Suicidal thoughts   0 0 0 0   PHQ-9 Score   17 5 4 9    Difficult doing work/chores   Very difficult Somewhat difficult Not difficult at all      Review of Systems  Musculoskeletal:  Positive for gait problem.       Pain both legs.  All other systems reviewed and are negative.      Objective:   Physical Exam  General no acute distress Mood and affect are appropriate Extremities without edema Motor strength is 5/5 bilateral hip flexion knee extension ankle dorsiflexion Negative straight leg raising bilaterally Decreased hip internal rotation on the left  side as well as external rotation on the right side accompanied by pain.  Normal hip range of motion on the right side      Assessment & Plan:   76.  89 year old female with severe multilevel lumbar spinal stenosis.  She fortunately her neurogenic claudication is primarily on the right side.  She has no evidence of lingering radiculopathy.  At this point I do think she can benefit from additional epidural steroid injection right L3-4.  Main concern with her would be steroid exposure.  Other alternatives include Toradol injection via epidural route. In terms of medication management would worry about sedative effects of medication in terms of cognition as well as fall risk.  This would include  muscle relaxants gabapentin and narcotic analgesics.  She is still living in an independent apartment at a senior living center

## 2024-01-28 ENCOUNTER — Telehealth: Payer: Self-pay

## 2024-01-28 NOTE — Telephone Encounter (Signed)
Copied from CRM [id number]. Topic: Clinical - Medical Advice >> Jan 28, 2024 11:26 AM Denese Killings wrote: Reason for CRM: Patient daughter [name] stated that she walked the halls last night looking for daughter and sister which they both passed and [name] is concerned. [name] is requesting a callback from daughter.

## 2024-01-29 NOTE — Telephone Encounter (Signed)
Spoke with pt's daughter, [name] (on dpr), offering OV today. Declined due to obligations but scheduled OV with Dr Milinda Antis tomorrow at 4:00.

## 2024-01-30 ENCOUNTER — Ambulatory Visit (INDEPENDENT_AMBULATORY_CARE_PROVIDER_SITE_OTHER): Payer: Medicare HMO | Admitting: Family Medicine

## 2024-01-30 ENCOUNTER — Encounter: Payer: Self-pay | Admitting: Family Medicine

## 2024-01-30 VITALS — BP 134/70 | HR 74 | Temp 98.1°F | Ht 61.0 in | Wt 185.1 lb

## 2024-01-30 DIAGNOSIS — D500 Iron deficiency anemia secondary to blood loss (chronic): Secondary | ICD-10-CM | POA: Diagnosis not present

## 2024-01-30 DIAGNOSIS — R634 Abnormal weight loss: Secondary | ICD-10-CM | POA: Insufficient documentation

## 2024-01-30 DIAGNOSIS — D649 Anemia, unspecified: Secondary | ICD-10-CM | POA: Diagnosis not present

## 2024-01-30 DIAGNOSIS — I10 Essential (primary) hypertension: Secondary | ICD-10-CM

## 2024-01-30 DIAGNOSIS — R410 Disorientation, unspecified: Secondary | ICD-10-CM | POA: Insufficient documentation

## 2024-01-30 DIAGNOSIS — Z741 Need for assistance with personal care: Secondary | ICD-10-CM

## 2024-01-30 DIAGNOSIS — N1832 Chronic kidney disease, stage 3b: Secondary | ICD-10-CM | POA: Diagnosis not present

## 2024-01-30 DIAGNOSIS — R4182 Altered mental status, unspecified: Secondary | ICD-10-CM | POA: Diagnosis not present

## 2024-01-30 DIAGNOSIS — E039 Hypothyroidism, unspecified: Secondary | ICD-10-CM | POA: Diagnosis not present

## 2024-01-30 DIAGNOSIS — R413 Other amnesia: Secondary | ICD-10-CM | POA: Diagnosis not present

## 2024-01-30 DIAGNOSIS — N2581 Secondary hyperparathyroidism of renal origin: Secondary | ICD-10-CM

## 2024-01-30 DIAGNOSIS — Z7409 Other reduced mobility: Secondary | ICD-10-CM

## 2024-01-30 LAB — POC URINALSYSI DIPSTICK (AUTOMATED)
Bilirubin, UA: NEGATIVE
Glucose, UA: NEGATIVE
Ketones, UA: NEGATIVE
Leukocytes, UA: NEGATIVE
Nitrite, UA: NEGATIVE
Protein, UA: NEGATIVE
Spec Grav, UA: 1.015 (ref 1.010–1.025)
Urobilinogen, UA: 0.2 U/dL
pH, UA: 6 (ref 5.0–8.0)

## 2024-01-30 NOTE — Assessment & Plan Note (Addendum)
Pt had episode at night/ early am where she forgot some family members were still alive and she looked for them No visual or audio hallucination  She is aware this happened and remembers it (good insight) and is fairly clear today Clear urinalysis Lab today  Reviewed meds- tylenol pm may cause anticholinergic side effects incl confusion- asked she change this to tylenol alone for pain (was agreeable) and changed on med list   This may be progression of her cognitive deficits -discussed this

## 2024-01-30 NOTE — Patient Instructions (Addendum)
Stop the tylenol pm and try plain tylenol at bedtime if you need it for sleep   Watch for changes in appetite and intake - curious   Labs today   Urinalysis is clear   I will work on a Child psychotherapist referral   Please keep [place] posted   I will talk to Dr Reece Agar
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
## 2024-01-30 NOTE — Assessment & Plan Note (Signed)
Pt is in independent living and her needs are increasing physically and cognitively  Daughter is sole care provider and voices some stress and difficulty   Will refer for social work call to help est needs/ resources and next steps  Suspect assisted living may be safer option

## 2024-01-31 LAB — CBC WITH DIFFERENTIAL/PLATELET
Absolute Lymphocytes: 1142 {cells}/uL (ref 850–3900)
Absolute Monocytes: 750 {cells}/uL (ref 200–950)
Basophils Absolute: 41 {cells}/uL (ref 0–200)
Basophils Relative: 0.8 %
Eosinophils Absolute: 102 {cells}/uL (ref 15–500)
Eosinophils Relative: 2 %
HCT: 35.1 % (ref 35.0–45.0)
Hemoglobin: 11.2 g/dL — ABNORMAL LOW (ref 11.7–15.5)
MCH: 28.8 pg (ref 27.0–33.0)
MCHC: 31.9 g/dL — ABNORMAL LOW (ref 32.0–36.0)
MCV: 90.2 fL (ref 80.0–100.0)
MPV: 10.2 fL (ref 7.5–12.5)
Monocytes Relative: 14.7 %
Neutro Abs: 3065 {cells}/uL (ref 1500–7800)
Neutrophils Relative %: 60.1 %
Platelets: 190 10*3/uL (ref 140–400)
RBC: 3.89 10*6/uL (ref 3.80–5.10)
RDW: 12.6 % (ref 11.0–15.0)
Total Lymphocyte: 22.4 %
WBC: 5.1 10*3/uL (ref 3.8–10.8)

## 2024-01-31 LAB — VITAMIN B12: Vitamin B-12: 694 pg/mL (ref 200–1100)

## 2024-01-31 LAB — COMPREHENSIVE METABOLIC PANEL
AG Ratio: 1.4 (calc) (ref 1.0–2.5)
ALT: 10 U/L (ref 6–29)
AST: 14 U/L (ref 10–35)
Albumin: 3.4 g/dL — ABNORMAL LOW (ref 3.6–5.1)
Alkaline phosphatase (APISO): 50 U/L (ref 37–153)
BUN/Creatinine Ratio: 14 (calc) (ref 6–22)
BUN: 32 mg/dL — ABNORMAL HIGH (ref 7–25)
CO2: 25 mmol/L (ref 20–32)
Calcium: 9.4 mg/dL (ref 8.6–10.4)
Chloride: 104 mmol/L (ref 98–110)
Creat: 2.23 mg/dL — ABNORMAL HIGH (ref 0.60–0.95)
Globulin: 2.5 g/dL (ref 1.9–3.7)
Glucose, Bld: 94 mg/dL (ref 65–99)
Potassium: 4.3 mmol/L (ref 3.5–5.3)
Sodium: 139 mmol/L (ref 135–146)
Total Bilirubin: 0.4 mg/dL (ref 0.2–1.2)
Total Protein: 5.9 g/dL — ABNORMAL LOW (ref 6.1–8.1)

## 2024-01-31 LAB — FERRITIN: Ferritin: 129 ng/mL (ref 16–288)

## 2024-01-31 LAB — TSH: TSH: 1.54 m[IU]/L (ref 0.40–4.50)

## 2024-01-31 LAB — IRON: Iron: 48 ug/dL (ref 45–160)

## 2024-02-01 ENCOUNTER — Encounter: Payer: Self-pay | Admitting: Family Medicine

## 2024-02-01 NOTE — Assessment & Plan Note (Signed)
TSH today  Taking levothyroxine 88 mcg as instructed No missed doses

## 2024-02-01 NOTE — Assessment & Plan Note (Signed)
Lab today  In setting of worse pm confusion

## 2024-02-01 NOTE — Assessment & Plan Note (Signed)
Blood pressure is stable today BP: 134/70    Labs ordered

## 2024-02-01 NOTE — Assessment & Plan Note (Signed)
Unsure if this is cause of worsened pm confusion  Urinalysis clear and lab pending   Taking aricept 5 mg (low dose in light of cardiac history) ? Consider addition of namenda if this is case Caregiver needs more help  SW referral done

## 2024-02-01 NOTE — Assessment & Plan Note (Signed)
Caregiver is unsure if pt is eating less or forgetting to eat but this is probable   Will get labs today  Reassuring exam

## 2024-02-01 NOTE — Assessment & Plan Note (Signed)
Lab today in setting of some pm confusion  Clear urinalysis

## 2024-02-01 NOTE — Assessment & Plan Note (Signed)
Pt has had some night time confusion  History of anemia with low iron   Labs today

## 2024-02-03 ENCOUNTER — Telehealth: Payer: Self-pay | Admitting: *Deleted

## 2024-02-03 NOTE — Progress Notes (Signed)
 Complex Care Management Note  Care Guide Note 02/03/2024 Name: [name] MRN: [id number] DOB: 1930/09/06  [name] is a 89 y.o. year old female who sees Rilla Baller, MD for primary care. I reached out to Ronal JULIANNA Metro by phone today to offer complex care management services.  Ms. Sheets was given information about Complex Care Management services today including:   The Complex Care Management services include support from the care team which includes your Nurse Care Manager, Clinical Social Worker, or Pharmacist.  The Complex Care Management team is here to help remove barriers to the health concerns and goals most important to you. Complex Care Management services are voluntary, and the patient may decline or stop services at any time by request to their care team member.   Complex Care Management Consent Status: Patient agreed to services and verbal consent obtained.   Follow up plan:  Telephone appointment with complex care management team member scheduled for:  02/11/2024  Encounter Outcome:  Patient Scheduled  Thedford Franks, CMA, Care Guide [facility]  [facility], [facility] Guide Direct Dial: [phone]  Fax: [phone] Website: [email or web address]

## 2024-02-09 ENCOUNTER — Encounter: Payer: Self-pay | Admitting: Family Medicine

## 2024-02-09 ENCOUNTER — Telehealth: Payer: Self-pay | Admitting: Family Medicine

## 2024-02-09 ENCOUNTER — Ambulatory Visit (INDEPENDENT_AMBULATORY_CARE_PROVIDER_SITE_OTHER): Payer: Medicare HMO | Admitting: Family Medicine

## 2024-02-09 VITALS — BP 136/64 | HR 86 | Temp 98.0°F | Ht 61.0 in | Wt 187.2 lb

## 2024-02-09 DIAGNOSIS — M810 Age-related osteoporosis without current pathological fracture: Secondary | ICD-10-CM

## 2024-02-09 DIAGNOSIS — Z7409 Other reduced mobility: Secondary | ICD-10-CM

## 2024-02-09 DIAGNOSIS — N1832 Chronic kidney disease, stage 3b: Secondary | ICD-10-CM | POA: Diagnosis not present

## 2024-02-09 DIAGNOSIS — D500 Iron deficiency anemia secondary to blood loss (chronic): Secondary | ICD-10-CM | POA: Diagnosis not present

## 2024-02-09 DIAGNOSIS — Z741 Need for assistance with personal care: Secondary | ICD-10-CM | POA: Diagnosis not present

## 2024-02-09 DIAGNOSIS — R410 Disorientation, unspecified: Secondary | ICD-10-CM | POA: Diagnosis not present

## 2024-02-09 DIAGNOSIS — D649 Anemia, unspecified: Secondary | ICD-10-CM | POA: Diagnosis not present

## 2024-02-09 DIAGNOSIS — Z79899 Other long term (current) drug therapy: Secondary | ICD-10-CM

## 2024-02-09 LAB — RENAL FUNCTION PANEL
Albumin: 3.6 g/dL (ref 3.5–5.2)
BUN: 30 mg/dL — ABNORMAL HIGH (ref 6–23)
CO2: 28 meq/L (ref 19–32)
Calcium: 9.9 mg/dL (ref 8.4–10.5)
Chloride: 105 meq/L (ref 96–112)
Creatinine, Ser: 2.03 mg/dL — ABNORMAL HIGH (ref 0.40–1.20)
GFR: 21.03 mL/min — ABNORMAL LOW (ref >60.00)
Glucose, Bld: 86 mg/dL (ref 70–99)
Phosphorus: 3.5 mg/dL (ref 2.3–4.6)
Potassium: 4.2 meq/L (ref 3.5–5.1)
Sodium: 141 meq/L (ref 135–145)

## 2024-02-09 MED ORDER — DONEPEZIL HCL 5 MG PO TABS: 5.0000 mg | ORAL_TABLET | Freq: Every day | ORAL | 1 refills | Status: DC

## 2024-02-09 MED ORDER — VITAMIN C 500 MG PO TABS: 500.0000 mg | ORAL_TABLET | ORAL | Status: AC

## 2024-02-09 MED ORDER — DOCUSATE SODIUM 100 MG PO CAPS: 100.0000 mg | ORAL_CAPSULE | Freq: Every day | ORAL | Status: DC | PRN

## 2024-02-09 NOTE — Telephone Encounter (Signed)
 Can we price out prolia  shot for patient? She's due for next shot (last done 05/2023)

## 2024-02-09 NOTE — Patient Instructions (Addendum)
 Labs today  We will check into prolia  shot as you are due.  Take vitamin C  on days you take iron .  Drop lasix  to 20mg  once daily dosing.  Return in 4-6 weeks for follow up visit

## 2024-02-09 NOTE — Progress Notes (Signed)
Ph: [phone] Fax: [phone]   Patient ID: [name], female    DOB: [birth year], 89 y.o.   MRN: [id number]  This visit was conducted in person.  BP 136/64   Pulse 86   Temp 98 F (36.7 C) (Oral)   Ht 5\' 1"  (1.549 m)   Wt 187 lb 4 oz (84.9 kg)   SpO2 97%   BMI 35.38 kg/m   BP Readings from Last 3 Encounters:  02/09/24 136/64  01/30/24 134/70  01/23/24 119/74   CC: follow up visit  Subjective:   HPI: [name] is a 89 y.o. female presenting on 02/09/2024 for Medical Management of Chronic Issues (Here for f/u after 01/30/24 OV w/ Dr Milinda Antis. Also, [name] wants to discuss how much medication pt takes. Pt accompanied by daughter, [name]. )   Saw Dr Milinda Antis last week, note reviewed.  Seen for mental status changes and weight loss - was walking halls at her ILF asking for deceased daughter and sister. She may have taken 2 days worth of pills instead of one. Work up revealed acute on chronic kidney disease (Cr 2.23) and malnourishment with TP 5.9, albumin 3.4,  with normal UA.  Was encouraged increased fluid intake.  Referred to social work/care coordinator - scheduled phone call with case manager later this week.   She continues donepezil 5mg  daily (low dose due to cardiac history) She continues fluoxetine 40mg  daily for depression, with good effect.  She recently stopped tylenol PM and in its place started plain tylenol QHS for sleep.   She's been eating sugared foods (swiss rolls, honey buns, pop tarts, also microwaveable dinners). Sitter was too expensive.   Continues PPI and pepcid daily - h/o ulcer 1990s, followed by colonic angioectasia on colonoscopy s/p APC 2021, h/o erosive gastropathy with h/o bleed, h/o IDA s/p pRBC transfusion and iron infusion 2021, continues on oral iron replacement.   Latest ESI done 12/2023 for R radiculopathy. Pending repeat steroid spine injection 03/09/2024.    Last prolia shot was 05/2023     Relevant past medical, surgical, family and  social history reviewed and updated as indicated. Interim medical history since our last visit reviewed. Allergies and medications reviewed and updated. Outpatient Medications Prior to Visit  Medication Sig Dispense Refill   acetaminophen (TYLENOL) 500 MG tablet Take 1,000 mg by mouth See admin instructions. Take 1,000 mg by mouth in the morning and an additional 1,000 mg once a day as needed for pain     calcium gluconate 500 MG tablet Take 1 tablet (500 mg total) by mouth daily.     cholecalciferol (VITAMIN D) 1000 UNITS tablet Take 1,000 Units by mouth daily.     denosumab (PROLIA) 60 MG/ML SOSY injection Inject 60 mg into the skin every 6 (six) months. Patient scheduled for 06/11/23 1 mL 0   famotidine (PEPCID) 20 MG tablet Take 20 mg by mouth daily.     FLUoxetine (PROZAC) 40 MG capsule Take 1 capsule (40 mg total) by mouth daily. 90 capsule 4   hydrALAZINE (APRESOLINE) 25 MG tablet TAKE 1 TABLET IN THE MORNING AND AT BEDTIME. 180 tablet 4   Iron, Ferrous Sulfate, 325 (65 Fe) MG TABS Take 325 mg by mouth every Monday, Wednesday, and Friday.     levothyroxine (SYNTHROID) 88 MCG tablet TAKE 1 TABLET EVERY DAY BEFORE BREAKFAST (Patient taking differently: Take 88 mcg by mouth daily before breakfast.) 90 tablet 4   lisinopril (ZESTRIL) 40 MG tablet Take 1  tablet (40 mg total) by mouth daily. (Patient taking differently: Take 40 mg by mouth in the morning.) 90 tablet 4   loratadine (CLARITIN) 10 MG tablet Take 1 tablet (10 mg total) by mouth every evening. 90 tablet 4   metoprolol succinate (TOPROL-XL) 25 MG 24 hr tablet Take 1 tablet (25 mg total) by mouth daily. (Patient taking differently: Take 25 mg by mouth at bedtime.) 90 tablet 4   mirabegron ER (MYRBETRIQ) 25 MG TB24 tablet Take 1 tablet (25 mg total) by mouth daily. (Patient taking differently: Take 25 mg by mouth at bedtime.) 90 tablet 4   omeprazole (PRILOSEC) 40 MG capsule Take 1 capsule (40 mg total) by mouth daily. (Patient taking  differently: Take 40 mg by mouth daily before breakfast.) 90 capsule 4   potassium chloride (KLOR-CON M) 10 MEQ tablet Take 1 tablet (10 mEq total) by mouth daily. 90 tablet 3   pravastatin (PRAVACHOL) 40 MG tablet Take 1 tablet (40 mg total) by mouth daily. (Patient taking differently: Take 40 mg by mouth at bedtime.) 90 tablet 4   Propylene Glycol (SYSTANE BALANCE OP) Place 1 drop into both eyes 3 (three) times daily as needed (for dryness).     verapamil (CALAN-SR) 240 MG CR tablet Take 1 tablet (240 mg total) by mouth daily. (Patient taking differently: Take 240 mg by mouth at bedtime.) 90 tablet 4   vitamin B-12 (CYANOCOBALAMIN) 500 MCG tablet Take 1 tablet (500 mcg total) by mouth once a week. (Patient taking differently: Take 250 mcg by mouth every Thursday.)     Ascorbic Acid (VITAMIN C PO) Take 1 tablet by mouth daily with breakfast.     docusate sodium (COLACE) 100 MG capsule Take 100 mg by mouth 2 (two) times daily as needed for mild constipation.     donepezil (ARICEPT) 5 MG tablet Take 1 tablet (5 mg total) by mouth at bedtime. For memory 30 tablet 9   furosemide (LASIX) 20 MG tablet Take 20 mg by mouth 2 (two) times daily.     polyethylene glycol (MIRALAX / GLYCOLAX) 17 g packet Take 17 g by mouth daily as needed for mild constipation. 14 each 0   furosemide (LASIX) 20 MG tablet Take 1 tablet (20 mg total) by mouth daily.     No facility-administered medications prior to visit.     Per HPI unless specifically indicated in ROS section below Review of Systems  Objective:  BP 136/64   Pulse 86   Temp 98 F (36.7 C) (Oral)   Ht 5\' 1"  (1.549 m)   Wt 187 lb 4 oz (84.9 kg)   SpO2 97%   BMI 35.38 kg/m   Wt Readings from Last 3 Encounters:  02/09/24 187 lb 4 oz (84.9 kg)  01/30/24 185 lb 2 oz (84 kg)  01/23/24 191 lb (86.6 kg)      Physical Exam Vitals and nursing note reviewed.  Constitutional:      Appearance: Normal appearance. She is not ill-appearing.  HENT:      Head: Normocephalic and atraumatic.     Mouth/Throat:     Mouth: Mucous membranes are moist.     Pharynx: Oropharynx is clear. No oropharyngeal exudate or posterior oropharyngeal erythema.  Eyes:     Extraocular Movements: Extraocular movements intact.     Conjunctiva/sclera: Conjunctivae normal.     Pupils: Pupils are equal, round, and reactive to light.  Cardiovascular:     Rate and Rhythm: Normal rate and regular rhythm.  Pulses: Normal pulses.     Heart sounds: Normal heart sounds.  Pulmonary:     Effort: Pulmonary effort is normal. No respiratory distress.     Breath sounds: Normal breath sounds. No wheezing, rhonchi or rales.  Abdominal:     General: Bowel sounds are normal. There is no distension.     Palpations: Abdomen is soft. There is no mass.     Tenderness: There is no abdominal tenderness. There is no guarding or rebound.  Musculoskeletal:     Right lower leg: No edema.     Left lower leg: No edema.     Comments: Wearing compression stockings  Skin:    General: Skin is warm and dry.     Findings: No rash.  Neurological:     Mental Status: She is alert.  Psychiatric:        Mood and Affect: Mood normal.        Behavior: Behavior normal.       Results for orders placed or performed in visit on 02/09/24  Renal function panel   Collection Time: 02/09/24 12:47 PM  Result Value Ref Range   Sodium 141 135 - 145 mEq/L   Potassium 4.2 3.5 - 5.1 mEq/L   Chloride 105 96 - 112 mEq/L   CO2 28 19 - 32 mEq/L   Albumin 3.6 3.5 - 5.2 g/dL   BUN 30 (H) 6 - 23 mg/dL   Creatinine, Ser 5.62 (H) 0.40 - 1.20 mg/dL   Glucose, Bld 86 70 - 99 mg/dL   Phosphorus 3.5 2.3 - 4.6 mg/dL   GFR 13.08 (L) >65.78 mL/min   Calcium 9.9 8.4 - 10.5 mg/dL    Assessment & Plan:   Problem List Items Addressed This Visit     Anemia   Recent anemia and iron levels stable- continue oral iron MWF.       CKD (chronic kidney disease) stage 3, GFR 30-59 ml/min (HCC)   Acute on chronic,  worsening kidney function with latest GFR of 32. Encouraged good hydration and nutrition status. Update renal panel.      Relevant Orders   Renal function panel (Completed)   Osteoporosis   Overdue for Prolia injection - request sent to rerun benefits for this year.  Update renal panel. Estimated Creatinine Clearance: 17.8 mL/min (A) (by C-G formula based on SCr of 2.03 mg/dL (H)).  Due to decreased GFR, would need repeat BMP 1 wk after Prolia shot.       Iron deficiency anemia   Continue oral iron replacement MWF Continue daily PPI with pepcid due to GI history      Polypharmacy   Reviewed medications. Given GI history, do recommend continued PPI + pepcid. Consideration for every other day PPI.  Will drop vit C to only on days she takes oral iron. Due to worsening kidney function, will also drop lasix to once daily      Impaired mobility and personal care   Pending eval for increased level of care need. She currently resides in ILF, would benefit from ALF with option for medication management assistance      Episode of confusion - Primary   Recent episode of altered mental status likely related to accidentally taking double dose of her medications the day prior. Workup also revealed acute on chronic kidney disease with worsening GFR to 32 and decreased protein levels. Agree with discontinuing Tylenol PM and evenings, only use Tylenol extra strength twice daily as needed. She seems to be  doing better since daughter has taken a more active role managing her medications.  She is also being evaluated for increased level of care need, care manager phone call pending. Update renal panel as per above.        Meds ordered this encounter  Medications   donepezil (ARICEPT) 5 MG tablet    Sig: Take 1 tablet (5 mg total) by mouth at bedtime. For memory    Dispense:  90 tablet    Refill:  1   docusate sodium (COLACE) 100 MG capsule    Sig: Take 1 capsule (100 mg total) by mouth  daily as needed for mild constipation or moderate constipation.   ascorbic acid (VITAMIN C) 500 MG tablet    Sig: Take 1 tablet (500 mg total) by mouth every Monday, Wednesday, and Friday. With iron    Orders Placed This Encounter  Procedures   Renal function panel    Patient Instructions  Labs today  We will check into prolia shot as you are due.  Take vitamin C on days you take iron.  Drop lasix to 20mg  once daily dosing.  Return in 4-6 weeks for follow up visit   Follow up plan: Return in about 6 weeks (around 03/22/2024) for follow up visit.  Eustaquio Boyden, MD

## 2024-02-10 ENCOUNTER — Encounter: Payer: Self-pay | Admitting: Family Medicine

## 2024-02-10 MED ORDER — DENOSUMAB 60 MG/ML ~~LOC~~ SOSY
60.0000 mg | PREFILLED_SYRINGE | Freq: Once | SUBCUTANEOUS | Status: DC
Start: 2024-02-24 — End: 2024-06-25

## 2024-02-10 NOTE — Telephone Encounter (Signed)
Referral started for pricing Prolia

## 2024-02-10 NOTE — Assessment & Plan Note (Addendum)
Recent episode of altered mental status likely related to accidentally taking double dose of her medications the day prior. Workup also revealed acute on chronic kidney disease with worsening GFR to 32 and decreased protein levels. Agree with discontinuing Tylenol PM and evenings, only use Tylenol extra strength twice daily as needed. She seems to be doing better since daughter has taken a more active role managing her medications.  She is also being evaluated for increased level of care need, care manager phone call pending. Update renal panel as per above.

## 2024-02-10 NOTE — Assessment & Plan Note (Signed)
Pending eval for increased level of care need. She currently resides in ILF, would benefit from ALF with option for medication management assistance

## 2024-02-10 NOTE — Assessment & Plan Note (Signed)
Continue oral iron replacement MWF Continue daily PPI with pepcid due to GI history

## 2024-02-10 NOTE — Assessment & Plan Note (Addendum)
Overdue for Prolia injection - request sent to rerun benefits for this year.  Update renal panel. Estimated Creatinine Clearance: 17.8 mL/min (A) (by C-G formula based on SCr of 2.03 mg/dL (H)).  Due to decreased GFR, would need repeat BMP 1 wk after Prolia shot.

## 2024-02-10 NOTE — Assessment & Plan Note (Addendum)
Reviewed medications. Given GI history, do recommend continued PPI + pepcid. Consideration for every other day PPI.  Will drop vit C to only on days she takes oral iron. Due to worsening kidney function, will also drop lasix to once daily

## 2024-02-10 NOTE — Addendum Note (Signed)
Addended by: Donnamarie Poag on: 02/10/2024 04:05 PM   Modules accepted: Orders

## 2024-02-10 NOTE — Assessment & Plan Note (Signed)
Acute on chronic, worsening kidney function with latest GFR of 32. Encouraged good hydration and nutrition status. Update renal panel.

## 2024-02-10 NOTE — Assessment & Plan Note (Signed)
Recent anemia and iron levels stable- continue oral iron MWF.

## 2024-02-11 ENCOUNTER — Ambulatory Visit: Payer: Self-pay | Admitting: *Deleted

## 2024-02-11 NOTE — Patient Instructions (Signed)
Visit Information  Thank you for taking time to visit with me today. Please don't hesitate to contact me if I can be of assistance to you.   Following are the goals we discussed today:   Goals Addressed             This Visit's Progress    Placement options       Activities and task to complete in order to accomplish goals.   LEVELS OF CARE TASK Review Assisted Living facilities from the list provided  Review list of private duty aids for possible follow up Continue to fill pill box every 2 days to increase adherance         Our next appointment is by telephone on 02/25/24 at 10am  Please call the care guide team at [phone] if you need to cancel or reschedule your appointment.   If you are experiencing a Mental Health or Behavioral Health Crisis or need someone to talk to, please call 911   Patient verbalizes understanding of instructions and care plan provided today and agrees to view in MyChart. Active MyChart status and patient understanding of how to access instructions and care plan via MyChart confirmed with patient.     Telephone follow up appointment with care management team member scheduled for: 02/25/24  Verna Czech, LCSW [place]  [facility], [facility] Licensed Clinical Social Worker Care Coordinator  Direct Dial: [phone]

## 2024-02-11 NOTE — Patient Outreach (Signed)
  Care Coordination   Initial Visit Note   02/11/2024 Name: [name] MRN: [id number] DOB: [birth year]  [name] is a 89 y.o. year old female who sees Brittany Boyden, MD for primary care. I spoke with  [name]'s daughter [name] by phone today.  What matters to the patients health and wellness today?  In home care and out of home care placement options    Goals Addressed             This Visit's Progress    Placement options       Activities and task to complete in order to accomplish goals.   LEVELS OF CARE TASK Review Assisted Living facilities from the list provided  Review list of private duty aids for possible follow up Continue to fill pill box every 2 days to increase adherance         SDOH assessments and interventions completed:  Yes  SDOH Interventions Today    Flowsheet Row Most Recent Value  SDOH Interventions   Food Insecurity Interventions Intervention Not Indicated  Housing Interventions Intervention Not Indicated  Transportation Interventions Intervention Not Indicated  Utilities Interventions Intervention Not Indicated        Care Coordination Interventions:  Yes, provided  Interventions Today    Flowsheet Row Most Recent Value  Chronic Disease   Chronic disease during today's visit Chronic Obstructive Pulmonary Disease (COPD), Hypertension (HTN)  General Interventions   General Interventions Discussed/Reviewed General Interventions Discussed, Level of Care  [pt assessed for community resource/placement needs. Pt currenlty lives in Independent living -[facility] for the last 9 years-daughter considering Assisted Living for patient due to progression of her medical condition and concerns re: med adhearance]  Level of Care Personal Care Services  [Discussed option of starting at the least restrictive, discussed adding in home aid services, also discussed ALF placement options-confirmed that patient had aid services in the past and found  it beneficial]  Pharmacy Interventions   Pharmacy Dicussed/Reviewed Pharmacy Topics Discussed  [Daughter fills pill box every 2 days to improve adherance]  Safety Interventions   Safety Discussed/Reviewed Safety Discussed  Advanced Directive Interventions   Advanced Directives Discussed/Reviewed Advanced Directives Discussed  [daughter [name] and [name] have HCPOA]       Follow up plan: Follow up call scheduled for 02/25/24    Encounter Outcome:  Patient Visit Completed

## 2024-02-13 NOTE — Telephone Encounter (Signed)
Checked order no authorization information.

## 2024-02-23 ENCOUNTER — Other Ambulatory Visit (HOSPITAL_COMMUNITY): Payer: Self-pay

## 2024-02-23 ENCOUNTER — Telehealth: Payer: Self-pay

## 2024-02-23 NOTE — Telephone Encounter (Signed)
 Prolia VOB initiated via [email or web address]  Next Prolia inj DUE: NOW

## 2024-02-23 NOTE — Telephone Encounter (Signed)
 Pharmacy Patient Advocate Encounter   Received notification from  [facility] Portal that prior authorization for PROLIA is required/requested.   Insurance verification completed.   The patient is insured through [employer] .   Per test claim: PA required; PA submitted to above mentioned insurance via CoverMyMeds Key/confirmation #/EOC [id number] Status is pending

## 2024-02-23 NOTE — Telephone Encounter (Signed)
 [name]

## 2024-02-24 ENCOUNTER — Other Ambulatory Visit (HOSPITAL_COMMUNITY): Payer: Self-pay

## 2024-02-24 NOTE — Telephone Encounter (Signed)
 Pt ready for scheduling for PROLIA on or after : 02/24/24  Option# 1: Buy/Bill (Office supplied medication)  Out-of-pocket cost due at time of clinic visit: $357  Number of injection/visits approved: 2  Primary: [employer] Prolia co-insurance: 20% Admin fee co-insurance: 20%  Secondary: --- Prolia co-insurance:  Admin fee co-insurance:   Medical Benefit Details: Date Benefits were checked: 02/23/24 Deductible: NO/ Coinsurance: 20%/ Admin Fee: 20%  Prior Auth: APPROVED PA# [id number] Expiration Date: 02/21/21-12/29/24   # of doses approved: 2 ----------------------------------------------------------------------- Option# 2- Med Obtained from pharmacy:  Pharmacy benefit: Copay $930.81 (Paid to pharmacy) Admin Fee: 20% (Pay at clinic)  Prior Auth: N/A PA# Expiration Date:   # of doses approved:   If patient wants fill through the pharmacy benefit please send prescription to: [employer], and include estimated need by date in rx notes. Pharmacy will ship medication directly to the office.  Patient NOT eligible for Prolia Copay Card. Copay Card can make patient's cost as little as $25. Link to apply: https://www.amgensupportplus.com/copay  ** This summary of benefits is an estimation of the patient's out-of-pocket cost. Exact cost may very based on individual plan coverage.

## 2024-02-24 NOTE — Telephone Encounter (Signed)
 Pharmacy Patient Advocate Encounter  Received notification from [facility] that Prior Authorization for PROLIA has been APPROVED from 02/21/21 to 12/29/24. Ran test claim, Copay is $930.81. This test claim was processed through [facility]- copay amounts may vary at other pharmacies due to pharmacy/plan contracts, or as the patient moves through the different stages of their insurance plan.   PA #/Case ID/Reference #: [id number]

## 2024-02-25 ENCOUNTER — Ambulatory Visit: Payer: Self-pay | Admitting: *Deleted

## 2024-02-25 NOTE — Patient Instructions (Signed)
 Visit Information  Thank you for taking time to visit with me today. Please don't hesitate to contact me if I can be of assistance to you.   Following are the goals we discussed today:   Goals Addressed             This Visit's Progress    Placement options       Activities and task to complete in order to accomplish goals.   LEVELS OF CARE TASK-resources to be mailed to daughter [address] [place], [place] [address] Review Assisted Living facilities from the list provided  Review list of private duty aids for possible follow up Continue to fill pill box every 2 days to increase adherance         Our next appointment is by telephone on 03/09/24 at 9am  Please call the care guide team at [phone] if you need to cancel or reschedule your appointment.   If you are experiencing a Mental Health or Behavioral Health Crisis or need someone to talk to, please call 911   Patient verbalizes understanding of instructions and care plan provided today and agrees to view in MyChart. Active MyChart status and patient understanding of how to access instructions and care plan via MyChart confirmed with patient.     Telephone follow up appointment with care management team member scheduled for: 03/09/24  Verna Czech, LCSW [place]  [facility], [facility] Licensed Clinical Social Worker Care Coordinator  Direct Dial: [phone]

## 2024-02-25 NOTE — Patient Outreach (Signed)
 Care Coordination   Follow Up Visit Note   02/25/2024 Name: [name] MRN: [id number] DOB: [birth year]  [name] is a 89 y.o. year old female who sees Eustaquio Boyden, MD for primary care. I spoke with  [name] by phone today.  What matters to the patients health and wellness today?  Patient's daughter confirms that patient did much better when she had someone coming in to the home daily to assist her. Patient's daughter agreeable to considering private duty care. HH orders to be requested by granddaughter as well.    Goals Addressed             This Visit's Progress    Placement options       Activities and task to complete in order to accomplish goals.   LEVELS OF CARE TASK-resources to be mailed to daughter [address] [place], [place] [address] Review Assisted Living facilities from the list provided  Review list of private duty aids for possible follow up Continue to fill pill box every 2 days to increase adherance         SDOH assessments and interventions completed:  No     Care Coordination Interventions:  Yes, provided  Interventions Today    Flowsheet Row Most Recent Value  Chronic Disease   Chronic disease during today's visit Chronic Obstructive Pulmonary Disease (COPD), Hypertension (HTN)  General Interventions   General Interventions Discussed/Reviewed General Interventions Reviewed, [employer]  [Continued assessment of community resource needs-confirmed that patient continues to reside in independent living- daughter continues to request addtional assistance with in home care and medication adherance]  Level of Care Personal Care Services  [Resources for in home care discussed and emailed to patient's daughter previously but not received -resources to be mailed to patient's daughter for review-benefits of in home care discussed-daughter confirms plan to request HH orders for PT and bath aid]  Pharmacy Interventions   Pharmacy  Dicussed/Reviewed Pharmacy Topics Discussed  [daughter continue to fill pilll box every 2 days to decrease chances of errors]     \ Follow up plan: Follow up call scheduled for 3/11225    Encounter Outcome:  Patient Visit Completed

## 2024-03-02 ENCOUNTER — Encounter: Payer: Self-pay | Admitting: *Deleted

## 2024-03-04 ENCOUNTER — Telehealth: Payer: Self-pay

## 2024-03-04 NOTE — Telephone Encounter (Signed)
 Copied from CRM [id number]. Topic: General - Other >> Mar 04, 2024  9:48 AM Truddie Crumble wrote: Reason for CRM: patient daughter called stating the patient legs are leaking and they need to be wrapped. Patient daughter want to see if a home health nurse can come to [name] to wrap the patient legs [phone] [name])

## 2024-03-05 ENCOUNTER — Ambulatory Visit (INDEPENDENT_AMBULATORY_CARE_PROVIDER_SITE_OTHER): Admitting: Family Medicine

## 2024-03-05 ENCOUNTER — Ambulatory Visit: Payer: Medicare HMO | Admitting: Physical Medicine & Rehabilitation

## 2024-03-05 ENCOUNTER — Encounter: Payer: Self-pay | Admitting: Family Medicine

## 2024-03-05 VITALS — BP 128/66 | HR 76 | Temp 97.9°F | Ht 61.0 in | Wt 198.2 lb

## 2024-03-05 DIAGNOSIS — R600 Localized edema: Secondary | ICD-10-CM

## 2024-03-05 DIAGNOSIS — L03116 Cellulitis of left lower limb: Secondary | ICD-10-CM

## 2024-03-05 DIAGNOSIS — N1832 Chronic kidney disease, stage 3b: Secondary | ICD-10-CM | POA: Diagnosis not present

## 2024-03-05 DIAGNOSIS — L03115 Cellulitis of right lower limb: Secondary | ICD-10-CM | POA: Diagnosis not present

## 2024-03-05 DIAGNOSIS — I872 Venous insufficiency (chronic) (peripheral): Secondary | ICD-10-CM | POA: Insufficient documentation

## 2024-03-05 MED ORDER — DOXYCYCLINE HYCLATE 100 MG PO TABS: 100.0000 mg | ORAL_TABLET | Freq: Two times a day (BID) | ORAL | 0 refills | Status: DC

## 2024-03-05 NOTE — Assessment & Plan Note (Signed)
 Update renal function prior to increasing lasix - previous GFR down 21 so we had cut down on lasix dosing from 20mg  BID to once daily. See below.

## 2024-03-05 NOTE — Assessment & Plan Note (Signed)
 Acute worsening, precipitating cellulitis.  Previously on lasix 40mg  daily - will return to this over next 3 days.  Update BNP.

## 2024-03-05 NOTE — Patient Instructions (Addendum)
 Labs today  Increase lasix to 40mg  daily for 3 days Start antibiotic doxycycline sent to pharmacy  Return Wednesday for cellulitis recheck 11:30am

## 2024-03-05 NOTE — Assessment & Plan Note (Signed)
 Concern for R>L LE cellulitis due to worsening pedal edema , acutely over the past 2-3 days.  Rx doxycycline Increase lasix to 40mg  daily Elevate legs. RTC next week for close f/u.  Pt and daughter agree with plan.

## 2024-03-05 NOTE — Progress Notes (Signed)
 Ph: [phone] Fax: [phone]   Patient ID: [name], female    DOB: 1930/05/05, 89 y.o.   MRN: [id number]  This visit was conducted in person.  BP 128/66   Pulse 76   Temp 97.9 F (36.6 C) (Oral)   Ht 5\' 1"  (1.549 m)   Wt 198 lb 4 oz (89.9 kg)   SpO2 96%   BMI 37.46 kg/m    CC: bilateral leg pain redness and swelling, weeping Subjective:   HPI: [name] is a 89 y.o. female presenting on 03/05/2024 for Leg Swelling (C/o B leg swelling, redness and weeping. Pt accompanied by daughter, [name]. )   2d ago started noticing swelling to bilateral feet.  Redness started yesterday morning.  Legs are painful and weeping.  Worsening dyspnea several weeks ago.  Notes increased gassiness.   No fevers/chills, chest pain, abd pain, nausea, diarrhea.   Upcoming appt Tues next week for  Upcoming appt next Tues with dermatology for wen scalp removal      Relevant past medical, surgical, family and social history reviewed and updated as indicated. Interim medical history since our last visit reviewed. Allergies and medications reviewed and updated. Outpatient Medications Prior to Visit  Medication Sig Dispense Refill   acetaminophen (TYLENOL) 500 MG tablet Take 1,000 mg by mouth See admin instructions. Take 1,000 mg by mouth in the morning and an additional 1,000 mg once a day as needed for pain     ascorbic acid (VITAMIN C) 500 MG tablet Take 1 tablet (500 mg total) by mouth every Monday, Wednesday, and Friday. With iron     calcium gluconate 500 MG tablet Take 1 tablet (500 mg total) by mouth daily.     cholecalciferol (VITAMIN D) 1000 UNITS tablet Take 1,000 Units by mouth daily.     denosumab (PROLIA) 60 MG/ML SOSY injection Inject 60 mg into the skin every 6 (six) months. Patient scheduled for 06/11/23 1 mL 0   docusate sodium (COLACE) 100 MG capsule Take 1 capsule (100 mg total) by mouth daily as needed for mild constipation or moderate constipation.     donepezil  (ARICEPT) 5 MG tablet Take 1 tablet (5 mg total) by mouth at bedtime. For memory 90 tablet 1   famotidine (PEPCID) 20 MG tablet Take 20 mg by mouth daily.     FLUoxetine (PROZAC) 40 MG capsule Take 1 capsule (40 mg total) by mouth daily. 90 capsule 4   furosemide (LASIX) 20 MG tablet Take 1 tablet (20 mg total) by mouth daily.     hydrALAZINE (APRESOLINE) 25 MG tablet TAKE 1 TABLET IN THE MORNING AND AT BEDTIME. 180 tablet 4   Iron, Ferrous Sulfate, 325 (65 Fe) MG TABS Take 325 mg by mouth every Monday, Wednesday, and Friday.     levothyroxine (SYNTHROID) 88 MCG tablet TAKE 1 TABLET EVERY DAY BEFORE BREAKFAST (Patient taking differently: Take 88 mcg by mouth daily before breakfast.) 90 tablet 4   lisinopril (ZESTRIL) 40 MG tablet Take 1 tablet (40 mg total) by mouth daily. (Patient taking differently: Take 40 mg by mouth in the morning.) 90 tablet 4   loratadine (CLARITIN) 10 MG tablet Take 1 tablet (10 mg total) by mouth every evening. 90 tablet 4   metoprolol succinate (TOPROL-XL) 25 MG 24 hr tablet Take 1 tablet (25 mg total) by mouth daily. (Patient taking differently: Take 25 mg by mouth at bedtime.) 90 tablet 4   mirabegron ER (MYRBETRIQ) 25 MG TB24 tablet  Take 1 tablet (25 mg total) by mouth daily. (Patient taking differently: Take 25 mg by mouth at bedtime.) 90 tablet 4   omeprazole (PRILOSEC) 40 MG capsule Take 1 capsule (40 mg total) by mouth daily. (Patient taking differently: Take 40 mg by mouth daily before breakfast.) 90 capsule 4   potassium chloride (KLOR-CON M) 10 MEQ tablet Take 1 tablet (10 mEq total) by mouth daily. 90 tablet 3   pravastatin (PRAVACHOL) 40 MG tablet Take 1 tablet (40 mg total) by mouth daily. (Patient taking differently: Take 40 mg by mouth at bedtime.) 90 tablet 4   Propylene Glycol (SYSTANE BALANCE OP) Place 1 drop into both eyes 3 (three) times daily as needed (for dryness).     verapamil (CALAN-SR) 240 MG CR tablet Take 1 tablet (240 mg total) by mouth daily.  (Patient taking differently: Take 240 mg by mouth at bedtime.) 90 tablet 4   vitamin B-12 (CYANOCOBALAMIN) 500 MCG tablet Take 1 tablet (500 mcg total) by mouth once a week. (Patient taking differently: Take 250 mcg by mouth every Thursday.)     Facility-Administered Medications Prior to Visit  Medication Dose Route Frequency Provider Last Rate Last Admin   denosumab (PROLIA) injection 60 mg  60 mg Subcutaneous Once Eustaquio Boyden, MD         Per HPI unless specifically indicated in ROS section below Review of Systems  Objective:  BP 128/66   Pulse 76   Temp 97.9 F (36.6 C) (Oral)   Ht 5\' 1"  (1.549 m)   Wt 198 lb 4 oz (89.9 kg)   SpO2 96%   BMI 37.46 kg/m   Wt Readings from Last 3 Encounters:  03/05/24 198 lb 4 oz (89.9 kg)  02/09/24 187 lb 4 oz (84.9 kg)  01/30/24 185 lb 2 oz (84 kg)      Physical Exam Vitals and nursing note reviewed.  Constitutional:      Appearance: Normal appearance.     Comments: Ambulates with rolling walker  Cardiovascular:     Rate and Rhythm: Normal rate and regular rhythm.     Pulses: Normal pulses.     Heart sounds: Normal heart sounds. No murmur heard. Pulmonary:     Effort: Pulmonary effort is normal. No respiratory distress.     Breath sounds: No wheezing, rhonchi or rales.     Comments: Bibasilar crackles Musculoskeletal:        General: Swelling and tenderness present.     Right lower leg: Edema (1+) present.     Left lower leg: Edema (1+) present.     Comments:  See picture Tender erythema to R>L lower legs  Weeping present to right anterior leg  Skin:    General: Skin is warm and dry.     Findings: Erythema and rash present.     Comments: Erythema to R>L lower legs, also present on left  Neurological:     Mental Status: She is alert.  Psychiatric:        Mood and Affect: Mood normal.        Behavior: Behavior normal.        Results for orders placed or performed in visit on 02/09/24  Renal function panel    Collection Time: 02/09/24 12:47 PM  Result Value Ref Range   Sodium 141 135 - 145 mEq/L   Potassium 4.2 3.5 - 5.1 mEq/L   Chloride 105 96 - 112 mEq/L   CO2 28 19 - 32 mEq/L   Albumin  3.6 3.5 - 5.2 g/dL   BUN 30 (H) 6 - 23 mg/dL   Creatinine, Ser 1.61 (H) 0.40 - 1.20 mg/dL   Glucose, Bld 86 70 - 99 mg/dL   Phosphorus 3.5 2.3 - 4.6 mg/dL   GFR 09.60 (L) >45.40 mL/min   Calcium 9.9 8.4 - 10.5 mg/dL    Assessment & Plan:   Problem List Items Addressed This Visit     CKD (chronic kidney disease) stage 3, GFR 30-59 ml/min (HCC)   Update renal function prior to increasing lasix - previous GFR down 21 so we had cut down on lasix dosing from 20mg  BID to once daily. See below.       Relevant Orders   Renal function panel   Bilateral lower extremity edema - Primary   Acute worsening, precipitating cellulitis.  Previously on lasix 40mg  daily - will return to this over next 3 days.  Update BNP.       Relevant Orders   Brain natriuretic peptide   Bilateral lower leg cellulitis   Concern for R>L LE cellulitis due to worsening pedal edema , acutely over the past 2-3 days.  Rx doxycycline Increase lasix to 40mg  daily Elevate legs. RTC next week for close f/u.  Pt and daughter agree with plan.       Relevant Orders   CBC     Meds ordered this encounter  Medications   doxycycline (VIBRA-TABS) 100 MG tablet    Sig: Take 1 tablet (100 mg total) by mouth 2 (two) times daily.    Dispense:  14 tablet    Refill:  0    Orders Placed This Encounter  Procedures   Brain natriuretic peptide   CBC   Renal function panel    Patient Instructions  Labs today  Increase lasix to 40mg  daily for 3 days Start antibiotic doxycycline sent to pharmacy  Return Wednesday for cellulitis recheck 11:30am  Follow up plan: Return in about 6 days (around 03/11/2024) for follow up visit.  Eustaquio Boyden, MD

## 2024-03-05 NOTE — Telephone Encounter (Signed)
 I spoke with pts daughter [name] (DPR signed); on 03/03/24 pt started with swollen legs that were weeping.pt is at independent living and pt put tissue on legs and it got wet and pt put another tissue and it was dry no weeping per [name] since. [name] said both legs are swollen with redness and pt complains if she rubs her legs it hurts.pt has been taking Lasix 20 mg daily. NO CP,SOB or other complaints. Dr Reece Agar will see pt today at 4 PM. UC & ED precautions given and [name] voiced understanding.Sending note as fYI to Dr Reece Agar.

## 2024-03-06 LAB — RENAL FUNCTION PANEL
Albumin: 3.6 g/dL (ref 3.6–5.1)
BUN/Creatinine Ratio: 12 (calc) (ref 6–22)
BUN: 21 mg/dL (ref 7–25)
CO2: 27 mmol/L (ref 20–32)
Calcium: 9.6 mg/dL (ref 8.6–10.4)
Chloride: 105 mmol/L (ref 98–110)
Creat: 1.77 mg/dL — ABNORMAL HIGH (ref 0.60–0.95)
Glucose, Bld: 81 mg/dL (ref 65–99)
Phosphorus: 3.5 mg/dL (ref 2.1–4.3)
Potassium: 4.2 mmol/L (ref 3.5–5.3)
Sodium: 142 mmol/L (ref 135–146)

## 2024-03-06 LAB — CBC
HCT: 33.5 % — ABNORMAL LOW (ref 35.0–45.0)
Hemoglobin: 10.9 g/dL — ABNORMAL LOW (ref 11.7–15.5)
MCH: 29.3 pg (ref 27.0–33.0)
MCHC: 32.5 g/dL (ref 32.0–36.0)
MCV: 90.1 fL (ref 80.0–100.0)
MPV: 10 fL (ref 7.5–12.5)
Platelets: 253 10*3/uL (ref 140–400)
RBC: 3.72 10*6/uL — ABNORMAL LOW (ref 3.80–5.10)
RDW: 13.2 % (ref 11.0–15.0)
WBC: 7.9 10*3/uL (ref 3.8–10.8)

## 2024-03-06 LAB — BRAIN NATRIURETIC PEPTIDE: Brain Natriuretic Peptide: 277 pg/mL — ABNORMAL HIGH (ref <100)

## 2024-03-08 NOTE — Progress Notes (Unsigned)
  PROCEDURE RECORD [facility]   Name: [name] DOB:[birth year] MRN: [id number]  Date:03/08/2024  Physician: Claudette Laws, MD    Nurse/CMA: Cheo Selvey RN   Allergies:  Allergies  Allergen Reactions   Trazodone And Nefazodone Other (See Comments)    "Worked against the patient"    Consent Signed: {yes no:314532}  Is patient diabetic? No.  CBG today?   Pregnant: No. LMP: No LMP recorded. Patient is postmenopausal. (age 23-55)  Anticoagulants: no Anti-inflammatory: yes (81 mg asa) Antibiotics: no  Procedure: right lumbar 3-4 transforaminal epidural steroid injection       Position: Prone Start Time: ***  End Time: ***  Fluoro Time: ***  RN/CMA Designer, multimedia    Time *** ***    BP *** ***    Pulse *** ***    Respirations *** ***    O2 Sat *** ***    S/S *** ***    Pain Level *** ***     D/C home with ***, patient A & O X 3, D/C instructions reviewed, and sits independently.

## 2024-03-09 ENCOUNTER — Encounter: Payer: Medicare HMO | Attending: Physical Medicine & Rehabilitation | Admitting: Physical Medicine & Rehabilitation

## 2024-03-09 ENCOUNTER — Encounter: Payer: Self-pay | Admitting: Physical Medicine & Rehabilitation

## 2024-03-09 ENCOUNTER — Ambulatory Visit: Payer: Self-pay | Admitting: *Deleted

## 2024-03-09 VITALS — BP 135/73 | HR 82 | Ht 61.0 in | Wt 195.6 lb

## 2024-03-09 DIAGNOSIS — M48061 Spinal stenosis, lumbar region without neurogenic claudication: Secondary | ICD-10-CM | POA: Insufficient documentation

## 2024-03-09 DIAGNOSIS — M5416 Radiculopathy, lumbar region: Secondary | ICD-10-CM | POA: Diagnosis not present

## 2024-03-09 MED ORDER — LIDOCAINE HCL 1 % IJ SOLN
5.0000 mL | Freq: Once | INTRAMUSCULAR | Status: AC
  Administered 2024-03-09: 5 mL

## 2024-03-09 MED ORDER — LIDOCAINE HCL (PF) 1 % IJ SOLN
2.0000 mL | Freq: Once | INTRAMUSCULAR | Status: AC
  Administered 2024-03-09: 2 mL

## 2024-03-09 MED ORDER — DEXAMETHASONE SODIUM PHOSPHATE 10 MG/ML IJ SOLN
10.0000 mg | Freq: Once | INTRAMUSCULAR | Status: AC
  Administered 2024-03-09: 10 mg

## 2024-03-09 MED ORDER — IOHEXOL 180 MG/ML  SOLN
3.0000 mL | Freq: Once | INTRAMUSCULAR | Status: AC
  Administered 2024-03-09: 3 mL

## 2024-03-09 NOTE — Progress Notes (Signed)
 R L3-4 Lumbar transforaminal epidural steroid injection under fluoroscopic guidance with contrast enhancement  Indication: Lumbosacral radiculitis is not relieved by medication management or other conservative care and interfering with self-care and mobility.   Informed consent was obtained after describing risk and benefits of the procedure with the patient, this includes bleeding, bruising, infection, paralysis and medication side effects.  The patient wishes to proceed and has given written consent.  Patient was placed in prone position.  The lumbar area was marked and prepped with Betadine.  It was entered with a 25-gauge 1-1/2 inch needle and one mL of 1% lidocaine was injected into the skin and subcutaneous tissue.  Then a 22-gauge 5" spinal needle was inserted into the Right L3-4  intervertebral foramen under AP, lateral, and oblique view.  Once needle tip was within the foramen on lateral views an dnor exceeding 6 o clock position on th epedical on AP viewed Isovue 200 was inected x 2ml Then a solution containing one mL of 10 mg per mL dexamethasone and 2 mL of 1% lidocaine was injected.  The patient tolerated procedure well.  Post procedure instructions were given.  Please see post procedure form.

## 2024-03-09 NOTE — Patient Instructions (Signed)
 Visit Information  Thank you for taking time to visit with me today. Please don't hesitate to contact me if I can be of assistance to you.   Following are the goals we discussed today:   Goals Addressed             This Visit's Progress    COMPLETED: In home care options       Activities and task to complete in order to accomplish goals.   LEVELS OF CARE TASK-please continue to review resources mailed to daughter Review list of private duty aids for possible follow up Continue to fill pill box regularly to increase adherence Discuss HH options with provider during the next appointment with provider         If you are experiencing a Mental Health or Behavioral Health Crisis or need someone to talk to, please call 911   Patient verbalizes understanding of instructions and care plan provided today and agrees to view in MyChart. Active MyChart status and patient understanding of how to access instructions and care plan via MyChart confirmed with patient.     No further follow up required: patient's daughter to contact this Child psychotherapist with any additional community resource needs  [employer]  [facility], [facility] Licensed Clinical Social Geologist, engineering Dial: [phone]

## 2024-03-09 NOTE — Patient Instructions (Signed)

## 2024-03-09 NOTE — Patient Outreach (Signed)
 Care Coordination   Follow Up Visit Note   03/09/2024 Name: [name] MRN: [id number] DOB: May 14, 1930  [name] is a 89 y.o. year old female who sees Eustaquio Boyden, MD for primary care. I spoke with  [name] by phone today.  What matters to the patients health and wellness today?  Patient's daughter confirms  that patient has benefited from in home support in the past and will plan to review the resources provided to look into private duty care again. The plan is for patient to remain in her own home as long as possible with assistance in place. Increasing socialization will continue to be encouraged. No additional community resource needs at this time.   Goals Addressed             This Visit's Progress    COMPLETED: In home care options       Activities and task to complete in order to accomplish goals.   LEVELS OF CARE TASK-please continue to review resources mailed to daughter Review list of private duty aids for possible follow up Continue to fill pill box regularly to increase adherence Discuss HH options with provider during the next appointment with provider         SDOH assessments and interventions completed:  No     Care Coordination Interventions:  Yes, provided  Interventions Today    Flowsheet Row Most Recent Value  Chronic Disease   Chronic disease during today's visit Chronic Obstructive Pulmonary Disease (COPD), Hypertension (HTN)  General Interventions   General Interventions Discussed/Reviewed General Interventions Reviewed, Doctor Visits, Community Resources  [Follow up regarding in home care resources provided. Patient's daughter continues to provide care and oversite for patient. Conitnues with plan for patient to remain in her own home(independent living) as long as possible.]  Doctor Visits Discussed/Reviewed Doctor Visits Discussed  [facility] 3/12 F/U on cellulitis, 3/11 steroid shot]  Level of Care Personal Care Services  [Confirms  receiving list of in home aid care-will review for potential follow up-would like to keep pt in her own home as long as possible but willing to consider ALF as patient's condition progresses]  Exercise Interventions   Exercise Discussed/Reviewed Exercise Discussed  [Patient's daughter to discusss possibility of HH PT to increase strength in legs-patient's daughter to discuss [facility] PT referral  with PCP]  Mental Health Interventions   Mental Health Discussed/Reviewed --  [encouraged continued participation in residential activities-remains active with [facility] weekly-wkg on active participation with residential activities]  Pharmacy Interventions   Pharmacy Dicussed/Reviewed Pharmacy Topics Discussed  [med adherance improving-patient's daughter continues to fill pill box 3-4 days in advance to reduce confusion]  Safety Interventions   Safety Discussed/Reviewed Safety Discussed  [resides in Independent Living, calls the front desk for emergencies, also has a emergency button in her room]       Follow up plan: No further intervention required.   Encounter Outcome:  Patient Visit Completed

## 2024-03-10 ENCOUNTER — Encounter: Payer: Self-pay | Admitting: Family Medicine

## 2024-03-10 ENCOUNTER — Telehealth: Payer: Self-pay | Admitting: Family Medicine

## 2024-03-10 ENCOUNTER — Ambulatory Visit: Admitting: Family Medicine

## 2024-03-10 VITALS — BP 136/66 | HR 85 | Temp 98.0°F | Ht 61.0 in | Wt 195.4 lb

## 2024-03-10 DIAGNOSIS — R600 Localized edema: Secondary | ICD-10-CM | POA: Diagnosis not present

## 2024-03-10 DIAGNOSIS — L03116 Cellulitis of left lower limb: Secondary | ICD-10-CM | POA: Diagnosis not present

## 2024-03-10 DIAGNOSIS — F331 Major depressive disorder, recurrent, moderate: Secondary | ICD-10-CM

## 2024-03-10 DIAGNOSIS — L03115 Cellulitis of right lower limb: Secondary | ICD-10-CM | POA: Diagnosis not present

## 2024-03-10 MED ORDER — NYSTATIN 100000 UNIT/GM EX CREA: 1.0000 | TOPICAL_CREAM | Freq: Two times a day (BID) | CUTANEOUS | 0 refills | Status: DC

## 2024-03-10 MED ORDER — CEPHALEXIN 500 MG PO CAPS: 500.0000 mg | ORAL_CAPSULE | Freq: Three times a day (TID) | ORAL | 0 refills | Status: DC

## 2024-03-10 NOTE — Telephone Encounter (Signed)
 Please schedule f/u appt with me this Friday at 4pm. I don't see they scheduled this on their way out.

## 2024-03-10 NOTE — Telephone Encounter (Signed)
 Pt is scheduled

## 2024-03-10 NOTE — Progress Notes (Unsigned)
 Ph: [phone] Fax: [phone]   Patient ID: [name], female    DOB: [birth year], 89 y.o.   MRN: [id number]  This visit was conducted in person.  BP 136/66   Pulse 85   Temp 98 F (36.7 C) (Oral)   Ht 5\' 1"  (1.549 m)   Wt 195 lb 6 oz (88.6 kg)   SpO2 94%   BMI 36.92 kg/m    CC: f/u cellulitis  Subjective:   HPI: [name] is a 89 y.o. female presenting on 03/10/2024 for Cellulitis (Here for 6 day f/u. Pt accompanied by daughter, [name]. )   See prior note for details.  Redness, swelling, weeping to R>L lower extremities x 2 days as well as worsened dyspnea for weeks. No other systemic symptoms.  Treated last week with increased lasix to 40mg  daily x3d. Also started on doxycycline antibiotic.   No significant improvement despite above treatment.  Ongoing leg swelling, worsening redness,   Labs last week showed stable mild anemia Hgb 10.9, improved Cr down to 1.77, mildly elevated BNP 277.   Saw PM&R yesterday s/p R L3/4 lumbar TF-ESI ([facility])     Relevant past medical, surgical, family and social history reviewed and updated as indicated. Interim medical history since our last visit reviewed. Allergies and medications reviewed and updated. Outpatient Medications Prior to Visit  Medication Sig Dispense Refill   acetaminophen (TYLENOL) 500 MG tablet Take 1,000 mg by mouth See admin instructions. Take 1,000 mg by mouth in the morning and an additional 1,000 mg once a day as needed for pain     ascorbic acid (VITAMIN C) 500 MG tablet Take 1 tablet (500 mg total) by mouth every Monday, Wednesday, and Friday. With iron     calcium gluconate 500 MG tablet Take 1 tablet (500 mg total) by mouth daily.     cholecalciferol (VITAMIN D) 1000 UNITS tablet Take 1,000 Units by mouth daily.     denosumab (PROLIA) 60 MG/ML SOSY injection Inject 60 mg into the skin every 6 (six) months. Patient scheduled for 06/11/23 1 mL 0   docusate sodium (COLACE) 100 MG capsule Take 1  capsule (100 mg total) by mouth daily as needed for mild constipation or moderate constipation.     donepezil (ARICEPT) 5 MG tablet Take 1 tablet (5 mg total) by mouth at bedtime. For memory 90 tablet 1   doxycycline (VIBRA-TABS) 100 MG tablet Take 1 tablet (100 mg total) by mouth 2 (two) times daily. 14 tablet 0   famotidine (PEPCID) 20 MG tablet Take 20 mg by mouth daily.     FLUoxetine (PROZAC) 40 MG capsule Take 1 capsule (40 mg total) by mouth daily. 90 capsule 4   furosemide (LASIX) 20 MG tablet Take 1 tablet (20 mg total) by mouth daily.     hydrALAZINE (APRESOLINE) 25 MG tablet TAKE 1 TABLET IN THE MORNING AND AT BEDTIME. 180 tablet 4   Iron, Ferrous Sulfate, 325 (65 Fe) MG TABS Take 325 mg by mouth every Monday, Wednesday, and Friday.     levothyroxine (SYNTHROID) 88 MCG tablet TAKE 1 TABLET EVERY DAY BEFORE BREAKFAST (Patient taking differently: Take 88 mcg by mouth daily before breakfast.) 90 tablet 4   lisinopril (ZESTRIL) 40 MG tablet Take 1 tablet (40 mg total) by mouth daily. (Patient taking differently: Take 40 mg by mouth in the morning.) 90 tablet 4   loratadine (CLARITIN) 10 MG tablet Take 1 tablet (10 mg total) by mouth every evening.  90 tablet 4   metoprolol succinate (TOPROL-XL) 25 MG 24 hr tablet Take 1 tablet (25 mg total) by mouth daily. (Patient taking differently: Take 25 mg by mouth at bedtime.) 90 tablet 4   mirabegron ER (MYRBETRIQ) 25 MG TB24 tablet Take 1 tablet (25 mg total) by mouth daily. (Patient taking differently: Take 25 mg by mouth at bedtime.) 90 tablet 4   omeprazole (PRILOSEC) 40 MG capsule Take 1 capsule (40 mg total) by mouth daily. (Patient taking differently: Take 40 mg by mouth daily before breakfast.) 90 capsule 4   potassium chloride (KLOR-CON M) 10 MEQ tablet Take 1 tablet (10 mEq total) by mouth daily. 90 tablet 3   pravastatin (PRAVACHOL) 40 MG tablet Take 1 tablet (40 mg total) by mouth daily. (Patient taking differently: Take 40 mg by mouth at  bedtime.) 90 tablet 4   Propylene Glycol (SYSTANE BALANCE OP) Place 1 drop into both eyes 3 (three) times daily as needed (for dryness).     verapamil (CALAN-SR) 240 MG CR tablet Take 1 tablet (240 mg total) by mouth daily. (Patient taking differently: Take 240 mg by mouth at bedtime.) 90 tablet 4   vitamin B-12 (CYANOCOBALAMIN) 500 MCG tablet Take 1 tablet (500 mcg total) by mouth once a week. (Patient taking differently: Take 250 mcg by mouth every Thursday.)     Facility-Administered Medications Prior to Visit  Medication Dose Route Frequency Provider Last Rate Last Admin   denosumab (PROLIA) injection 60 mg  60 mg Subcutaneous Once Eustaquio Boyden, MD         Per HPI unless specifically indicated in ROS section below Review of Systems  Objective:  BP 136/66   Pulse 85   Temp 98 F (36.7 C) (Oral)   Ht 5\' 1"  (1.549 m)   Wt 195 lb 6 oz (88.6 kg)   SpO2 94%   BMI 36.92 kg/m   Wt Readings from Last 3 Encounters:  03/10/24 195 lb 6 oz (88.6 kg)  03/09/24 195 lb 9.6 oz (88.7 kg)  03/05/24 198 lb 4 oz (89.9 kg)      Physical Exam Vitals and nursing note reviewed.  Constitutional:      Appearance: Normal appearance. She is not ill-appearing.     Comments: Ambulates with walker  Cardiovascular:     Rate and Rhythm: Normal rate and regular rhythm.     Pulses: Normal pulses.     Heart sounds: Normal heart sounds. No murmur heard. Pulmonary:     Effort: Pulmonary effort is normal. No respiratory distress.     Breath sounds: No wheezing, rhonchi or rales.     Comments: Bibasilar crackles Musculoskeletal:        General: Tenderness present.     Right lower leg: Edema present.     Left lower leg: Edema present.     Comments:  Diminished pulses bilateral feet  Skin:    General: Skin is warm.     Findings: Erythema and rash present.     Comments:  Partially blanching erythema to BLE with splotchy rash to anterior lower legs Posterior of legs (calves), feet not affected    Neurological:     Mental Status: She is alert.  Psychiatric:        Mood and Affect: Mood normal.        Behavior: Behavior normal.          Results for orders placed or performed in visit on 03/05/24  Brain natriuretic peptide   Collection  Time: 03/05/24  4:36 PM  Result Value Ref Range   Brain Natriuretic Peptide 277 (H) <100 pg/mL  CBC   Collection Time: 03/05/24  4:36 PM  Result Value Ref Range   WBC 7.9 3.8 - 10.8 Thousand/uL   RBC 3.72 (L) 3.80 - 5.10 Million/uL   Hemoglobin 10.9 (L) 11.7 - 15.5 g/dL   HCT 40.9 (L) 81.1 - 91.4 %   MCV 90.1 80.0 - 100.0 fL   MCH 29.3 27.0 - 33.0 pg   MCHC 32.5 32.0 - 36.0 g/dL   RDW 78.2 95.6 - 21.3 %   Platelets 253 140 - 400 Thousand/uL   MPV 10.0 7.5 - 12.5 fL  Renal function panel   Collection Time: 03/05/24  4:36 PM  Result Value Ref Range   Glucose, Bld 81 65 - 99 mg/dL   BUN 21 7 - 25 mg/dL   Creat 0.86 (H) 5.78 - 0.95 mg/dL   BUN/Creatinine Ratio 12 6 - 22 (calc)   Sodium 142 135 - 146 mmol/L   Potassium 4.2 3.5 - 5.3 mmol/L   Chloride 105 98 - 110 mmol/L   CO2 27 20 - 32 mmol/L   Calcium 9.6 8.6 - 10.4 mg/dL   Phosphorus 3.5 2.1 - 4.3 mg/dL   Albumin 3.6 3.6 - 5.1 g/dL    Assessment & Plan:   Problem List Items Addressed This Visit   None    No orders of the defined types were placed in this encounter.   No orders of the defined types were placed in this encounter.   There are no Patient Instructions on file for this visit.  Follow up plan: No follow-ups on file.  Eustaquio Boyden, MD

## 2024-03-10 NOTE — Patient Instructions (Addendum)
 Stop doxycycline antibiotic Continue lasix 40mg  daily in the mornings.  Start Keflex antibiotic 500mg  three times daily  Try nystatin cream to rash.  Return to see me on Friday at 4pm.

## 2024-03-11 NOTE — Assessment & Plan Note (Addendum)
 Notes worsening depressed mood despite prozac 40mg  daily - will need to further evaluate at f/u visits.

## 2024-03-11 NOTE — Assessment & Plan Note (Signed)
 This is some better with 3d increase in lasix to 40mg  - will continue higher dose at this time.

## 2024-03-11 NOTE — Assessment & Plan Note (Addendum)
 Actually worse despite doxycycline - suggesting either incomplete cellulitis treatment vs fungal infection vs inflammation/stasis dermatitis rather than bacterial cellulitis.  Only anterior of legs affected, posterior calves unaffected.  Stop doxycycline, start keflex in its place x5 days, Rx topical nystatin for possible fungal component, continue leg elevation, continue higher lasix dose.  Reassess in 2 days - consider unna boot vs topical steroid.

## 2024-03-12 ENCOUNTER — Encounter: Payer: Self-pay | Admitting: Family Medicine

## 2024-03-12 ENCOUNTER — Ambulatory Visit (INDEPENDENT_AMBULATORY_CARE_PROVIDER_SITE_OTHER): Admitting: Family Medicine

## 2024-03-12 VITALS — BP 134/64 | HR 75 | Temp 97.6°F | Ht 61.0 in | Wt 198.5 lb

## 2024-03-12 DIAGNOSIS — L03115 Cellulitis of right lower limb: Secondary | ICD-10-CM | POA: Diagnosis not present

## 2024-03-12 DIAGNOSIS — L03116 Cellulitis of left lower limb: Secondary | ICD-10-CM

## 2024-03-12 DIAGNOSIS — I5032 Chronic diastolic (congestive) heart failure: Secondary | ICD-10-CM

## 2024-03-12 DIAGNOSIS — N1832 Chronic kidney disease, stage 3b: Secondary | ICD-10-CM

## 2024-03-12 DIAGNOSIS — R600 Localized edema: Secondary | ICD-10-CM

## 2024-03-12 DIAGNOSIS — N3941 Urge incontinence: Secondary | ICD-10-CM

## 2024-03-12 MED ORDER — MIRABEGRON ER 25 MG PO TB24
25.0000 mg | ORAL_TABLET | Freq: Every day | ORAL | 3 refills | Status: AC
Start: 2024-03-12 — End: ?

## 2024-03-12 NOTE — Progress Notes (Signed)
 Ph: [phone] Fax: [phone]   Patient ID: [name], female    DOB: 1930-10-03, 89 y.o.   MRN: [id number]  This visit was conducted in person.  BP 134/64   Pulse 75   Temp 97.6 F (36.4 C) (Oral)   Ht 5\' 1"  (1.549 m)   Wt 198 lb 8 oz (90 kg)   SpO2 95%   BMI 37.51 kg/m    CC: 2d f/u leg swelling/rash  Subjective:   HPI: [name] is a 89 y.o. female presenting on 03/12/2024 for Medical Management of Chronic Issues (Here for 2 day cellulitis f/u. Pt accompanied by daughter, [name]. )   See prior notes for details.  Weight up 3 lbs since last visit, despite higher lasix 40mg  dose.  Redness to legs is slowly improving, pedal edema may have again worsened.       Relevant past medical, surgical, family and social history reviewed and updated as indicated. Interim medical history since our last visit reviewed. Allergies and medications reviewed and updated. Outpatient Medications Prior to Visit  Medication Sig Dispense Refill   acetaminophen (TYLENOL) 500 MG tablet Take 1,000 mg by mouth See admin instructions. Take 1,000 mg by mouth in the morning and an additional 1,000 mg once a day as needed for pain     ascorbic acid (VITAMIN C) 500 MG tablet Take 1 tablet (500 mg total) by mouth every Monday, Wednesday, and Friday. With iron     calcium gluconate 500 MG tablet Take 1 tablet (500 mg total) by mouth daily.     cephALEXin (KEFLEX) 500 MG capsule Take 1 capsule (500 mg total) by mouth 3 (three) times daily. 15 capsule 0   cholecalciferol (VITAMIN D) 1000 UNITS tablet Take 1,000 Units by mouth daily.     denosumab (PROLIA) 60 MG/ML SOSY injection Inject 60 mg into the skin every 6 (six) months. Patient scheduled for 06/11/23 1 mL 0   docusate sodium (COLACE) 100 MG capsule Take 1 capsule (100 mg total) by mouth daily as needed for mild constipation or moderate constipation.     donepezil (ARICEPT) 5 MG tablet Take 1 tablet (5 mg total) by mouth at bedtime. For  memory 90 tablet 1   doxycycline (VIBRA-TABS) 100 MG tablet Take 1 tablet (100 mg total) by mouth 2 (two) times daily. 14 tablet 0   famotidine (PEPCID) 20 MG tablet Take 20 mg by mouth daily.     FLUoxetine (PROZAC) 40 MG capsule Take 1 capsule (40 mg total) by mouth daily. 90 capsule 4   furosemide (LASIX) 20 MG tablet Take 1 tablet (20 mg total) by mouth daily.     hydrALAZINE (APRESOLINE) 25 MG tablet TAKE 1 TABLET IN THE MORNING AND AT BEDTIME. 180 tablet 4   Iron, Ferrous Sulfate, 325 (65 Fe) MG TABS Take 325 mg by mouth every Monday, Wednesday, and Friday.     levothyroxine (SYNTHROID) 88 MCG tablet TAKE 1 TABLET EVERY DAY BEFORE BREAKFAST (Patient taking differently: Take 88 mcg by mouth daily before breakfast.) 90 tablet 4   lisinopril (ZESTRIL) 40 MG tablet Take 1 tablet (40 mg total) by mouth daily. (Patient taking differently: Take 40 mg by mouth in the morning.) 90 tablet 4   loratadine (CLARITIN) 10 MG tablet Take 1 tablet (10 mg total) by mouth every evening. 90 tablet 4   metoprolol succinate (TOPROL-XL) 25 MG 24 hr tablet Take 1 tablet (25 mg total) by mouth daily. (Patient taking differently: Take  25 mg by mouth at bedtime.) 90 tablet 4   nystatin cream (MYCOSTATIN) Apply 1 Application topically 2 (two) times daily. 60 g 0   omeprazole (PRILOSEC) 40 MG capsule Take 1 capsule (40 mg total) by mouth daily. (Patient taking differently: Take 40 mg by mouth daily before breakfast.) 90 capsule 4   potassium chloride (KLOR-CON M) 10 MEQ tablet Take 1 tablet (10 mEq total) by mouth daily. 90 tablet 3   pravastatin (PRAVACHOL) 40 MG tablet Take 1 tablet (40 mg total) by mouth daily. (Patient taking differently: Take 40 mg by mouth at bedtime.) 90 tablet 4   Propylene Glycol (SYSTANE BALANCE OP) Place 1 drop into both eyes 3 (three) times daily as needed (for dryness).     verapamil (CALAN-SR) 240 MG CR tablet Take 1 tablet (240 mg total) by mouth daily. (Patient taking differently: Take 240  mg by mouth at bedtime.) 90 tablet 4   vitamin B-12 (CYANOCOBALAMIN) 500 MCG tablet Take 1 tablet (500 mcg total) by mouth once a week. (Patient taking differently: Take 250 mcg by mouth every Thursday.)     mirabegron ER (MYRBETRIQ) 25 MG TB24 tablet Take 1 tablet (25 mg total) by mouth daily. (Patient taking differently: Take 25 mg by mouth at bedtime.) 90 tablet 4   Facility-Administered Medications Prior to Visit  Medication Dose Route Frequency Provider Last Rate Last Admin   denosumab (PROLIA) injection 60 mg  60 mg Subcutaneous Once Eustaquio Boyden, MD         Per HPI unless specifically indicated in ROS section below Review of Systems  Objective:  BP 134/64   Pulse 75   Temp 97.6 F (36.4 C) (Oral)   Ht 5\' 1"  (1.549 m)   Wt 198 lb 8 oz (90 kg)   SpO2 95%   BMI 37.51 kg/m   Wt Readings from Last 3 Encounters:  03/12/24 198 lb 8 oz (90 kg)  03/10/24 195 lb 6 oz (88.6 kg)  03/09/24 195 lb 9.6 oz (88.7 kg)      Physical Exam Vitals and nursing note reviewed.  Musculoskeletal:        General: Swelling and tenderness present.     Right lower leg: Edema present.     Left lower leg: Edema present.     Comments: Persistent BLE edema, improved erythematous rash   Skin:    General: Skin is warm and dry.     Findings: Rash present.       Bilateral unna boot placement: Informed consent obtained. Using standard procedure, calomine impregnated gauze used to dress and wrap legs followed by ACE wrap. Patient tolerated well. Red flags to seek urgent care reviewed.   Presence (and extent of) or absence of obvious signs of infection: improved erythema, no drainage Presence (and extent of) or absence of necrotic, devitalized or non-viable tissue: absent Other material in the wound that is expected to inhibit healing or promote adjacent tissue breakdown: none    Results for orders placed or performed in visit on 03/05/24  Brain natriuretic peptide   Collection Time: 03/05/24   4:36 PM  Result Value Ref Range   Brain Natriuretic Peptide 277 (H) <100 pg/mL  CBC   Collection Time: 03/05/24  4:36 PM  Result Value Ref Range   WBC 7.9 3.8 - 10.8 Thousand/uL   RBC 3.72 (L) 3.80 - 5.10 Million/uL   Hemoglobin 10.9 (L) 11.7 - 15.5 g/dL   HCT 95.2 (L) 84.1 - 32.4 %   MCV  90.1 80.0 - 100.0 fL   MCH 29.3 27.0 - 33.0 pg   MCHC 32.5 32.0 - 36.0 g/dL   RDW 36.6 44.0 - 34.7 %   Platelets 253 140 - 400 Thousand/uL   MPV 10.0 7.5 - 12.5 fL  Renal function panel   Collection Time: 03/05/24  4:36 PM  Result Value Ref Range   Glucose, Bld 81 65 - 99 mg/dL   BUN 21 7 - 25 mg/dL   Creat 4.25 (H) 9.56 - 0.95 mg/dL   BUN/Creatinine Ratio 12 6 - 22 (calc)   Sodium 142 135 - 146 mmol/L   Potassium 4.2 3.5 - 5.3 mmol/L   Chloride 105 98 - 110 mmol/L   CO2 27 20 - 32 mmol/L   Calcium 9.6 8.6 - 10.4 mg/dL   Phosphorus 3.5 2.1 - 4.3 mg/dL   Albumin 3.6 3.6 - 5.1 g/dL    Assessment & Plan:   Problem List Items Addressed This Visit     CKD (chronic kidney disease) stage 3, GFR 30-59 ml/min (HCC)   Caution with diuretic use - continue lasix 40mg  daily for now, along with potassium daily. Consider updated labs next visit.       Urge incontinence   Requests myrbetriq refilled, although notes it is very costly.      Relevant Medications   mirabegron ER (MYRBETRIQ) 25 MG TB24 tablet   Chronic diastolic CHF (congestive heart failure) (HCC)   BNP was mildly elevated (277) on latest check. Lasix dose increased to 40mg  daily - continue.      Bilateral lower extremity edema - Primary   Persistent - continue lasix 40mg  daily.  Anticipate associated stasis dermatitis changes to anterior BLE (posterior legs unaffected) - weeping/maceration is improved.  Not consistent with DVT.  Placed in unna boots today, discussed home care.  If no improvement consider increased dose vs augmentation with metolazone diuretic.       Bilateral lower leg cellulitis   Improvement since  switch to keflex /nystatin vs improvement of stasis dermatitis.  Recheck in 1 wk, sooner if needed/deterioration noted.         Meds ordered this encounter  Medications   mirabegron ER (MYRBETRIQ) 25 MG TB24 tablet    Sig: Take 1 tablet (25 mg total) by mouth daily.    Dispense:  90 tablet    Refill:  3    No orders of the defined types were placed in this encounter.   Patient Instructions  Continue lasix 40mg  daily.  Finish keflex antibiotic pills and nystatin cream.  I think skin changes are more due to rash from leg swelling than true cellulitis infection. Keep legs as elevated as possible.  We placed unna boots today - keep on as tolerated for max 5-7 days at a time.  Let us know if not improving with treatment.   Follow up plan: No follow-ups on file.  Eustaquio Boyden, MD

## 2024-03-12 NOTE — Patient Instructions (Addendum)
 Continue lasix 40mg  daily.  Finish keflex antibiotic pills and nystatin cream.  I think skin changes are more due to rash from leg swelling than true cellulitis infection. Keep legs as elevated as possible.  We placed unna boots today - keep on as tolerated for max 5-7 days at a time.  Let us know if not improving with treatment.

## 2024-03-13 ENCOUNTER — Encounter: Payer: Self-pay | Admitting: Family Medicine

## 2024-03-13 NOTE — Assessment & Plan Note (Addendum)
 Caution with diuretic use - continue lasix 40mg  daily for now, along with potassium daily. Consider updated labs next visit.

## 2024-03-13 NOTE — Assessment & Plan Note (Signed)
 Requests myrbetriq refilled, although notes it is very costly.

## 2024-03-13 NOTE — Assessment & Plan Note (Addendum)
 BNP was mildly elevated (277) on latest check. Lasix dose increased to 40mg  daily - continue.

## 2024-03-13 NOTE — Assessment & Plan Note (Addendum)
 Persistent - continue lasix 40mg  daily.  Anticipate associated stasis dermatitis changes to anterior BLE (posterior legs unaffected) - weeping/maceration is improved.  Not consistent with DVT.  Placed in unna boots today, discussed home care.  If no improvement consider increased dose vs augmentation with metolazone diuretic.

## 2024-03-13 NOTE — Assessment & Plan Note (Addendum)
 Improvement since switch to keflex /nystatin vs improvement of stasis dermatitis.  Recheck in 1 wk, sooner if needed/deterioration noted.

## 2024-03-16 DIAGNOSIS — D225 Melanocytic nevi of trunk: Secondary | ICD-10-CM | POA: Diagnosis not present

## 2024-03-16 DIAGNOSIS — L7211 Pilar cyst: Secondary | ICD-10-CM | POA: Diagnosis not present

## 2024-03-16 DIAGNOSIS — I781 Nevus, non-neoplastic: Secondary | ICD-10-CM | POA: Diagnosis not present

## 2024-03-17 ENCOUNTER — Telehealth: Payer: Self-pay | Admitting: Family Medicine

## 2024-03-17 DIAGNOSIS — N1832 Chronic kidney disease, stage 3b: Secondary | ICD-10-CM

## 2024-03-17 NOTE — Telephone Encounter (Addendum)
 Pt is due for her prolia injection but based on her most recent labs (03/05/24) her creatinine clearance is 29.35.  Dr. Reece Agar - please advise if pt can proceed with prolia. Thank you!

## 2024-03-18 NOTE — Telephone Encounter (Signed)
 Ok to proceed with Prolia injection, do recommend rpt BMP 1 wk after Prolia shot - labs ordered.

## 2024-03-19 DIAGNOSIS — H5203 Hypermetropia, bilateral: Secondary | ICD-10-CM | POA: Diagnosis not present

## 2024-03-22 ENCOUNTER — Ambulatory Visit (INDEPENDENT_AMBULATORY_CARE_PROVIDER_SITE_OTHER): Payer: Medicare HMO | Admitting: Family Medicine

## 2024-03-22 ENCOUNTER — Encounter: Payer: Self-pay | Admitting: Family Medicine

## 2024-03-22 VITALS — BP 170/74 | HR 73 | Temp 97.8°F | Ht 61.0 in | Wt 201.2 lb

## 2024-03-22 DIAGNOSIS — R600 Localized edema: Secondary | ICD-10-CM

## 2024-03-22 DIAGNOSIS — I5032 Chronic diastolic (congestive) heart failure: Secondary | ICD-10-CM

## 2024-03-22 DIAGNOSIS — I10 Essential (primary) hypertension: Secondary | ICD-10-CM

## 2024-03-22 DIAGNOSIS — N1832 Chronic kidney disease, stage 3b: Secondary | ICD-10-CM

## 2024-03-22 LAB — RENAL FUNCTION PANEL
Albumin: 3.6 g/dL (ref 3.5–5.2)
BUN: 28 mg/dL — ABNORMAL HIGH (ref 6–23)
CO2: 31 meq/L (ref 19–32)
Calcium: 10.2 mg/dL (ref 8.4–10.5)
Chloride: 103 meq/L (ref 96–112)
Creatinine, Ser: 2.15 mg/dL — ABNORMAL HIGH (ref 0.40–1.20)
GFR: 19.62 mL/min — ABNORMAL LOW (ref >60.00)
Glucose, Bld: 96 mg/dL (ref 70–99)
Phosphorus: 3.8 mg/dL (ref 2.3–4.6)
Potassium: 4.9 meq/L (ref 3.5–5.1)
Sodium: 141 meq/L (ref 135–145)

## 2024-03-22 LAB — BRAIN NATRIURETIC PEPTIDE: Pro B Natriuretic peptide (BNP): 318 pg/mL — ABNORMAL HIGH (ref 0.0–100.0)

## 2024-03-22 MED ORDER — TORSEMIDE 10 MG PO TABS
10.0000 mg | ORAL_TABLET | Freq: Every day | ORAL | 1 refills | Status: DC
Start: 2024-03-22 — End: 2024-05-06

## 2024-03-22 NOTE — Progress Notes (Signed)
 Ph: [phone] Fax: [phone]   Patient ID: [name], female    DOB: 06-Nov-1930, 89 y.o.   MRN: [id number]  This visit was conducted in person.  BP (!) 170/74   Pulse 73   Temp 97.8 F (36.6 C) (Oral)   Ht 5\' 1"  (1.549 m)   Wt 201 lb 4 oz (91.3 kg)   SpO2 96%   BMI 38.03 kg/m    BP Readings from Last 3 Encounters:  03/22/24 (!) 170/74  03/12/24 134/64  03/10/24 136/66   CC: leg swelling f/u  Subjective:   HPI: [name] is a 89 y.o. female presenting on 03/22/2024 for Medical Management of Chronic Issues (Here for 6 wk f/u. Pt accompanied by daughter, [name]. )   See prior notes for details.  Weight up another 3 lbs since last visit. This is despite lasix 40mg  daily.  Last visit placed in unna boots bilaterally - kept in place for several days then noted leg swelling worsened.  Redness is improved. She completed doxycycline 7d followed by keflex 5d course.   Hasn't yet started Myrbetriq.  New sitter starts 03/30/2024 - 2d/wk from 11am to 3pm.   Notes increased gassiness without constipation or abd pain.  She notes she forgot PM pills last night (including her verapamil).      Relevant past medical, surgical, family and social history reviewed and updated as indicated. Interim medical history since our last visit reviewed. Allergies and medications reviewed and updated. Outpatient Medications Prior to Visit  Medication Sig Dispense Refill   acetaminophen (TYLENOL) 500 MG tablet Take 1,000 mg by mouth See admin instructions. Take 1,000 mg by mouth in the morning and an additional 1,000 mg once a day as needed for pain     ascorbic acid (VITAMIN C) 500 MG tablet Take 1 tablet (500 mg total) by mouth every Monday, Wednesday, and Friday. With iron     calcium gluconate 500 MG tablet Take 1 tablet (500 mg total) by mouth daily.     cholecalciferol (VITAMIN D) 1000 UNITS tablet Take 1,000 Units by mouth daily.     denosumab (PROLIA) 60 MG/ML SOSY injection  Inject 60 mg into the skin every 6 (six) months. Patient scheduled for 06/11/23 1 mL 0   docusate sodium (COLACE) 100 MG capsule Take 1 capsule (100 mg total) by mouth daily as needed for mild constipation or moderate constipation.     donepezil (ARICEPT) 5 MG tablet Take 1 tablet (5 mg total) by mouth at bedtime. For memory 90 tablet 1   famotidine (PEPCID) 20 MG tablet Take 20 mg by mouth daily.     FLUoxetine (PROZAC) 40 MG capsule Take 1 capsule (40 mg total) by mouth daily. 90 capsule 4   hydrALAZINE (APRESOLINE) 25 MG tablet TAKE 1 TABLET IN THE MORNING AND AT BEDTIME. 180 tablet 4   Iron, Ferrous Sulfate, 325 (65 Fe) MG TABS Take 325 mg by mouth every Monday, Wednesday, and Friday.     levothyroxine (SYNTHROID) 88 MCG tablet TAKE 1 TABLET EVERY DAY BEFORE BREAKFAST (Patient taking differently: Take 88 mcg by mouth daily before breakfast.) 90 tablet 4   lisinopril (ZESTRIL) 40 MG tablet Take 1 tablet (40 mg total) by mouth daily. (Patient taking differently: Take 40 mg by mouth in the morning.) 90 tablet 4   loratadine (CLARITIN) 10 MG tablet Take 1 tablet (10 mg total) by mouth every evening. 90 tablet 4   metoprolol succinate (TOPROL-XL) 25 MG 24  hr tablet Take 1 tablet (25 mg total) by mouth daily. (Patient taking differently: Take 25 mg by mouth at bedtime.) 90 tablet 4   mirabegron ER (MYRBETRIQ) 25 MG TB24 tablet Take 1 tablet (25 mg total) by mouth daily. 90 tablet 3   nystatin cream (MYCOSTATIN) Apply 1 Application topically 2 (two) times daily. 60 g 0   omeprazole (PRILOSEC) 40 MG capsule Take 1 capsule (40 mg total) by mouth daily. (Patient taking differently: Take 40 mg by mouth daily before breakfast.) 90 capsule 4   potassium chloride (KLOR-CON M) 10 MEQ tablet Take 1 tablet (10 mEq total) by mouth daily. 90 tablet 3   pravastatin (PRAVACHOL) 40 MG tablet Take 1 tablet (40 mg total) by mouth daily. (Patient taking differently: Take 40 mg by mouth at bedtime.) 90 tablet 4   Propylene  Glycol (SYSTANE BALANCE OP) Place 1 drop into both eyes 3 (three) times daily as needed (for dryness).     verapamil (CALAN-SR) 240 MG CR tablet Take 1 tablet (240 mg total) by mouth daily. (Patient taking differently: Take 240 mg by mouth at bedtime.) 90 tablet 4   vitamin B-12 (CYANOCOBALAMIN) 500 MCG tablet Take 1 tablet (500 mcg total) by mouth once a week. (Patient taking differently: Take 250 mcg by mouth every Thursday.)     cephALEXin (KEFLEX) 500 MG capsule Take 1 capsule (500 mg total) by mouth 3 (three) times daily. 15 capsule 0   doxycycline (VIBRA-TABS) 100 MG tablet Take 1 tablet (100 mg total) by mouth 2 (two) times daily. 14 tablet 0   furosemide (LASIX) 20 MG tablet Take 1 tablet (20 mg total) by mouth daily.     Facility-Administered Medications Prior to Visit  Medication Dose Route Frequency Provider Last Rate Last Admin   denosumab (PROLIA) injection 60 mg  60 mg Subcutaneous Once Eustaquio Boyden, MD         Per HPI unless specifically indicated in ROS section below Review of Systems  Objective:  BP (!) 170/74   Pulse 73   Temp 97.8 F (36.6 C) (Oral)   Ht 5\' 1"  (1.549 m)   Wt 201 lb 4 oz (91.3 kg)   SpO2 96%   BMI 38.03 kg/m   Wt Readings from Last 3 Encounters:  03/22/24 201 lb 4 oz (91.3 kg)  03/12/24 198 lb 8 oz (90 kg)  03/10/24 195 lb 6 oz (88.6 kg)      Physical Exam Vitals and nursing note reviewed.  Constitutional:      Appearance: Normal appearance. She is not ill-appearing.  Cardiovascular:     Rate and Rhythm: Normal rate and regular rhythm.     Pulses: Normal pulses.     Heart sounds: Normal heart sounds. No murmur heard. Pulmonary:     Effort: Pulmonary effort is normal. No respiratory distress.     Breath sounds: No wheezing, rhonchi or rales.     Comments: Coarse crackles throughout both lungs Musculoskeletal:        General: Swelling present.     Right lower leg: Edema present.     Left lower leg: Edema present.     Comments:  Nonpitting edema present bilateral lower legs from calves to feet   Skin:    General: Skin is warm and dry.     Findings: Erythema (mild BLE) present.  Neurological:     Mental Status: She is alert.       Results for orders placed or performed in visit on  03/05/24  Brain natriuretic peptide   Collection Time: 03/05/24  4:36 PM  Result Value Ref Range   Brain Natriuretic Peptide 277 (H) <100 pg/mL  CBC   Collection Time: 03/05/24  4:36 PM  Result Value Ref Range   WBC 7.9 3.8 - 10.8 Thousand/uL   RBC 3.72 (L) 3.80 - 5.10 Million/uL   Hemoglobin 10.9 (L) 11.7 - 15.5 g/dL   HCT 16.1 (L) 09.6 - 04.5 %   MCV 90.1 80.0 - 100.0 fL   MCH 29.3 27.0 - 33.0 pg   MCHC 32.5 32.0 - 36.0 g/dL   RDW 40.9 81.1 - 91.4 %   Platelets 253 140 - 400 Thousand/uL   MPV 10.0 7.5 - 12.5 fL  Renal function panel   Collection Time: 03/05/24  4:36 PM  Result Value Ref Range   Glucose, Bld 81 65 - 99 mg/dL   BUN 21 7 - 25 mg/dL   Creat 7.82 (H) 9.56 - 0.95 mg/dL   BUN/Creatinine Ratio 12 6 - 22 (calc)   Sodium 142 135 - 146 mmol/L   Potassium 4.2 3.5 - 5.3 mmol/L   Chloride 105 98 - 110 mmol/L   CO2 27 20 - 32 mmol/L   Calcium 9.6 8.6 - 10.4 mg/dL   Phosphorus 3.5 2.1 - 4.3 mg/dL   Albumin 3.6 3.6 - 5.1 g/dL    Assessment & Plan:   Problem List Items Addressed This Visit     Hypertension   BP elevation noted - in setting of missing meds last evening - including verapamil. No med changes recommended as previous BPs have been adequately controlled.       Relevant Medications   torsemide (DEMADEX) 10 MG tablet   CKD (chronic kidney disease) stage 3, GFR 30-59 ml/min (HCC)   Update renal panel.  Concern lasix not as effective in CKD - will change to torsemide 10mg  daily.  She continues potassium72mEq daily.       Chronic diastolic CHF (congestive heart failure) (HCC)   Update BNP.       Relevant Medications   torsemide (DEMADEX) 10 MG tablet   Other Relevant Orders   Brain  natriuretic peptide   Bilateral lower extremity edema - Primary   Persistent, worsened despite lasix 40mg  daily - without significant UOP anticipate poor diuretic effect in CKD. Will switch to torsemide 10mg  daily, update with effect RTC 2-3 wks f/u visit.         Meds ordered this encounter  Medications   torsemide (DEMADEX) 10 MG tablet    Sig: Take 1 tablet (10 mg total) by mouth daily.    Dispense:  30 tablet    Refill:  1    To replace furosemide    Orders Placed This Encounter  Procedures   Brain natriuretic peptide    Patient Instructions  Stop lasix (furosemide). In its place start torsemide daily in the morning. Goal is good urine output to get fluid off.  Good to see you today.  Labs today  Return in about 2 weeks for follow up visit   Follow up plan: Return in about 2 weeks (around 04/05/2024) for follow up visit.  Eustaquio Boyden, MD

## 2024-03-22 NOTE — Assessment & Plan Note (Signed)
 BP elevation noted - in setting of missing meds last evening - including verapamil. No med changes recommended as previous BPs have been adequately controlled.

## 2024-03-22 NOTE — Telephone Encounter (Signed)
 Please see referral.

## 2024-03-22 NOTE — Assessment & Plan Note (Addendum)
 Update renal panel.  Concern lasix not as effective in CKD - will change to torsemide 10mg  daily.  She continues potassium67mEq daily.

## 2024-03-22 NOTE — Patient Instructions (Addendum)
 Stop lasix (furosemide). In its place start torsemide daily in the morning. Goal is good urine output to get fluid off.  Good to see you today.  Labs today  Return in about 2 weeks for follow up visit

## 2024-03-22 NOTE — Assessment & Plan Note (Signed)
 Persistent, worsened despite lasix 40mg  daily - without significant UOP anticipate poor diuretic effect in CKD. Will switch to torsemide 10mg  daily, update with effect RTC 2-3 wks f/u visit.

## 2024-03-22 NOTE — Assessment & Plan Note (Addendum)
Update BNP.

## 2024-03-23 ENCOUNTER — Other Ambulatory Visit: Payer: Self-pay | Admitting: Family Medicine

## 2024-03-23 DIAGNOSIS — N179 Acute kidney failure, unspecified: Secondary | ICD-10-CM

## 2024-03-29 ENCOUNTER — Encounter: Payer: Self-pay | Admitting: Podiatry

## 2024-03-29 ENCOUNTER — Ambulatory Visit: Payer: Medicare HMO | Admitting: Podiatry

## 2024-03-29 DIAGNOSIS — M79674 Pain in right toe(s): Secondary | ICD-10-CM

## 2024-03-29 DIAGNOSIS — M79675 Pain in left toe(s): Secondary | ICD-10-CM | POA: Diagnosis not present

## 2024-03-29 DIAGNOSIS — B351 Tinea unguium: Secondary | ICD-10-CM | POA: Diagnosis not present

## 2024-03-29 DIAGNOSIS — N1832 Chronic kidney disease, stage 3b: Secondary | ICD-10-CM | POA: Diagnosis not present

## 2024-03-29 NOTE — Progress Notes (Signed)
 This patient returns to my office for at risk foot care.  This patient requires this care by a professional since this patient will be at risk due to having  CKD.  Patient presents to the office with her daughter.  This patient is unable to cut nails herself since the patient cannot reach hiernails.These nails are painful walking and wearing shoes.  This patient presents for at risk foot care today.  General Appearance  Alert, conversant and in no acute stress.  Vascular  Dorsalis pedis and posterior tibial  pulses are weakly  palpable  bilaterally.  Capillary return is within normal limits  bilaterally. Temperature is within normal limits  bilaterally.  Neurologic  Senn-Weinstein monofilament wire test within normal limits  bilaterally. Muscle power within normal limits bilaterally.  Nails Thick disfigured discolored nails with subungual debris  from hallux to fifth toes bilaterally. No evidence of bacterial infection or drainage bilaterally.  Orthopedic  No limitations of motion  feet .  No crepitus or effusions noted.  Hallux nails are shortened.  Skin  normotropic skin with no porokeratosis noted bilaterally.  No signs of infections or ulcers noted.     Onychomycosis  Pain in right toes  Pain in left toes  Consent was obtained for treatment procedures.   Mechanical debridement of nails 1-5  bilaterally performed with a nail nipper.  Filed with dremel without incident.   Cauterize fourth toe right foot.   Return office visit    3 months                  Told patient to return for periodic foot care and evaluation due to potential at risk complications.   Helane Gunther DPM

## 2024-03-30 ENCOUNTER — Other Ambulatory Visit

## 2024-03-30 ENCOUNTER — Other Ambulatory Visit (INDEPENDENT_AMBULATORY_CARE_PROVIDER_SITE_OTHER)

## 2024-03-30 DIAGNOSIS — N1832 Chronic kidney disease, stage 3b: Secondary | ICD-10-CM | POA: Diagnosis not present

## 2024-03-30 DIAGNOSIS — N179 Acute kidney failure, unspecified: Secondary | ICD-10-CM

## 2024-03-30 LAB — BASIC METABOLIC PANEL WITH GFR
BUN: 29 mg/dL — ABNORMAL HIGH (ref 6–23)
CO2: 31 meq/L (ref 19–32)
Calcium: 9.7 mg/dL (ref 8.4–10.5)
Chloride: 105 meq/L (ref 96–112)
Creatinine, Ser: 2.37 mg/dL — ABNORMAL HIGH (ref 0.40–1.20)
GFR: 17.45 mL/min — ABNORMAL LOW (ref >60.00)
Glucose, Bld: 95 mg/dL (ref 70–99)
Potassium: 4.4 meq/L (ref 3.5–5.1)
Sodium: 143 meq/L (ref 135–145)

## 2024-03-31 ENCOUNTER — Other Ambulatory Visit: Payer: Self-pay

## 2024-03-31 ENCOUNTER — Emergency Department (HOSPITAL_COMMUNITY)

## 2024-03-31 ENCOUNTER — Encounter (HOSPITAL_COMMUNITY): Payer: Self-pay

## 2024-03-31 ENCOUNTER — Emergency Department (HOSPITAL_COMMUNITY)
Admission: EM | Admit: 2024-03-31 | Discharge: 2024-03-31 | Disposition: A | Discharge status: Home / Self Care | Attending: Emergency Medicine | Admitting: Emergency Medicine

## 2024-03-31 DIAGNOSIS — Z85038 Personal history of other malignant neoplasm of large intestine: Secondary | ICD-10-CM | POA: Diagnosis not present

## 2024-03-31 DIAGNOSIS — Z9049 Acquired absence of other specified parts of digestive tract: Secondary | ICD-10-CM | POA: Diagnosis not present

## 2024-03-31 DIAGNOSIS — K5900 Constipation, unspecified: Secondary | ICD-10-CM | POA: Diagnosis not present

## 2024-03-31 DIAGNOSIS — K644 Residual hemorrhoidal skin tags: Secondary | ICD-10-CM | POA: Insufficient documentation

## 2024-03-31 DIAGNOSIS — I129 Hypertensive chronic kidney disease with stage 1 through stage 4 chronic kidney disease, or unspecified chronic kidney disease: Secondary | ICD-10-CM | POA: Insufficient documentation

## 2024-03-31 DIAGNOSIS — N183 Chronic kidney disease, stage 3 unspecified: Secondary | ICD-10-CM | POA: Diagnosis not present

## 2024-03-31 DIAGNOSIS — E039 Hypothyroidism, unspecified: Secondary | ICD-10-CM | POA: Diagnosis not present

## 2024-03-31 DIAGNOSIS — Z87891 Personal history of nicotine dependence: Secondary | ICD-10-CM | POA: Diagnosis not present

## 2024-03-31 DIAGNOSIS — R109 Unspecified abdominal pain: Secondary | ICD-10-CM | POA: Diagnosis not present

## 2024-03-31 DIAGNOSIS — R001 Bradycardia, unspecified: Secondary | ICD-10-CM | POA: Diagnosis not present

## 2024-03-31 DIAGNOSIS — Z79899 Other long term (current) drug therapy: Secondary | ICD-10-CM | POA: Diagnosis not present

## 2024-03-31 DIAGNOSIS — I959 Hypotension, unspecified: Secondary | ICD-10-CM | POA: Diagnosis not present

## 2024-03-31 LAB — CBC WITH DIFFERENTIAL/PLATELET
Abs Immature Granulocytes: 0.04 10*3/uL (ref 0.00–0.07)
Basophils Absolute: 0.1 10*3/uL (ref 0.0–0.1)
Basophils Relative: 1 %
Eosinophils Absolute: 0.3 10*3/uL (ref 0.0–0.5)
Eosinophils Relative: 3 %
HCT: 37.2 % (ref 36.0–46.0)
Hemoglobin: 11.1 g/dL — ABNORMAL LOW (ref 12.0–15.0)
Immature Granulocytes: 1 %
Lymphocytes Relative: 20 %
Lymphs Abs: 1.6 10*3/uL (ref 0.7–4.0)
MCH: 29.1 pg (ref 26.0–34.0)
MCHC: 29.8 g/dL — ABNORMAL LOW (ref 30.0–36.0)
MCV: 97.4 fL (ref 80.0–100.0)
Monocytes Absolute: 0.7 10*3/uL (ref 0.1–1.0)
Monocytes Relative: 9 %
Neutro Abs: 5.6 10*3/uL (ref 1.7–7.7)
Neutrophils Relative %: 66 %
Platelets: 254 10*3/uL (ref 150–400)
RBC: 3.82 MIL/uL — ABNORMAL LOW (ref 3.87–5.11)
RDW: 13.8 % (ref 11.5–15.5)
WBC: 8.3 10*3/uL (ref 4.0–10.5)
nRBC: 0 % (ref 0.0–0.2)

## 2024-03-31 LAB — BASIC METABOLIC PANEL WITH GFR
Anion gap: 9 (ref 5–15)
BUN: 30 mg/dL — ABNORMAL HIGH (ref 8–23)
CO2: 26 mmol/L (ref 22–32)
Calcium: 9.9 mg/dL (ref 8.9–10.3)
Chloride: 104 mmol/L (ref 98–111)
Creatinine, Ser: 2.36 mg/dL — ABNORMAL HIGH (ref 0.44–1.00)
GFR, Estimated: 19 mL/min — ABNORMAL LOW (ref >60)
Glucose, Bld: 99 mg/dL (ref 70–99)
Potassium: 4.5 mmol/L (ref 3.5–5.1)
Sodium: 139 mmol/L (ref 135–145)

## 2024-03-31 MED ORDER — FLEET ENEMA RE ENEM
1.0000 | ENEMA | Freq: Once | RECTAL | Status: AC
  Administered 2024-03-31: 1 via RECTAL
  Filled 2024-03-31: qty 1

## 2024-03-31 MED ORDER — MAGNESIUM CITRATE PO SOLN
1.0000 | Freq: Once | ORAL | Status: AC
Start: 2024-03-31 — End: 2024-03-31
  Administered 2024-03-31: 1 via ORAL
  Filled 2024-03-31: qty 296

## 2024-03-31 MED ORDER — BISACODYL 10 MG RE SUPP
10.0000 mg | Freq: Once | RECTAL | Status: AC
  Administered 2024-03-31: 10 mg via RECTAL
  Filled 2024-03-31: qty 1

## 2024-03-31 MED ORDER — POLYETHYLENE GLYCOL 3350 17 G PO PACK: 17.0000 g | PACK | Freq: Every day | ORAL | 0 refills | Status: DC

## 2024-03-31 NOTE — ED Provider Notes (Signed)
 Handoff from [facility] PA, pt having incomplete bowel movements, no N/V, pt is passing, gas. X-ray shows constipation vs obstruction. Trial fleet enema, if successful evacuation, discharge home with miralax.   Physical Exam  BP (!) 153/62   Pulse 68   Temp 98.4 F (36.9 C) (Oral)   Resp 16   Ht 5\' 1"  (1.549 m)   Wt 91.3 kg   SpO2 100%   BMI 38.03 kg/m   Physical Exam Vitals and nursing note reviewed.  Constitutional:      General: She is not in acute distress.    Appearance: She is well-developed.  HENT:     Head: Normocephalic and atraumatic.  Eyes:     Conjunctiva/sclera: Conjunctivae normal.  Cardiovascular:     Rate and Rhythm: Normal rate and regular rhythm.     Heart sounds: No murmur heard. Pulmonary:     Effort: Pulmonary effort is normal. No respiratory distress.     Breath sounds: Normal breath sounds.  Abdominal:     Palpations: Abdomen is soft.     Tenderness: There is no abdominal tenderness.  Musculoskeletal:        General: No swelling.     Cervical back: Neck supple.  Skin:    General: Skin is warm and dry.     Capillary Refill: Capillary refill takes less than 2 seconds.  Neurological:     Mental Status: She is alert.  Psychiatric:        Mood and Affect: Mood normal.     Procedures  Procedures  ED Course / MDM    Medical Decision Making Patient is a 89 year old female, here for constipation, KUB, shows constipation, versus obstruction.  She has had a Trynity Skousen amount of bowel movement, and just smears, we trialed a Fleet enema, after Sherian Maroon attempted to disimpact her, and it was unsuccessful.  She only got a little some urine out.  We are going to try bisacodyl suppository, to see if there is any relief.  Amount and/or Complexity of Data Reviewed Labs: ordered.    Details: Labs unremarkable Radiology: ordered.    Details: CT abdomen pelvis, shows no evidence of any kind of obstruction, some evidence of some proctitis. Discussion of  management or test interpretation with external provider(s): Prior to the CT, patient relieved Seve, mag citrate, had good relief, of symptoms.  She then went to CT, no evidence of obstruction.  She likely has findings showing proctitis, from the mag citrate, she consumed.  I discharged her home, with MiraLAX, and strict return precautions.  She was instructed to return if she has intractable nausea, vomiting, severe abdominal pain  Risk OTC drugs.        Pete Pelt, [place] 03/31/24 1024    Arby Barrette, MD 03/31/24 1606

## 2024-03-31 NOTE — ED Provider Notes (Signed)
 [place] EMERGENCY DEPARTMENT AT [facility] Provider Note   CSN: [id number] Arrival date & time: 03/31/24  0501     History  No chief complaint on file.   [name] is a 89 y.o. female.  HPI   89 year old female presents to the ED with complaints of constipation.  Patient states that she has felt more constipated over the past 3-4 days.  States that she still been able to have bowel movements about every day but they have been smaller and more firm in consistency.  This states that she tried to use Colace 1 day with some improvement of symptoms.  States that she feels like she is not completely emptying her bowels.  Denies any fevers, chills, nausea, vomiting, abdominal/rectal pain.  Denies any hematochezia/melena.  States she had history of similar symptoms but it was "several years ago. "States that she was given a medicine to help with constipation which significantly improved symptoms.  Past medical history significant for hypertension, hyperlipidemia, hypothyroidism, CKD 3, chf, neoplasm of colon, hyperparathyroidism, hypothyroidism, mdd,   Home Medications Prior to Admission medications   Medication Sig Start Date End Date Taking? Authorizing Provider  acetaminophen (TYLENOL) 500 MG tablet Take 1,000 mg by mouth See admin instructions. Take 1,000 mg by mouth in the morning and an additional 1,000 mg once a day as needed for pain    [provider]  ascorbic acid (VITAMIN C) 500 MG tablet Take 1 tablet (500 mg total) by mouth every Monday, Wednesday, and Friday. With iron 02/09/24   Eustaquio Boyden, MD  calcium gluconate 500 MG tablet Take 1 tablet (500 mg total) by mouth daily. 04/14/21   Eustaquio Boyden, MD  cholecalciferol (VITAMIN D) 1000 UNITS tablet Take 1,000 Units by mouth daily.    [provider]  denosumab (PROLIA) 60 MG/ML SOSY injection Inject 60 mg into the skin every 6 (six) months. Patient scheduled for 06/11/23 05/30/23    Eustaquio Boyden, MD  docusate sodium (COLACE) 100 MG capsule Take 1 capsule (100 mg total) by mouth daily as needed for mild constipation or moderate constipation. 02/09/24   Eustaquio Boyden, MD  donepezil (ARICEPT) 5 MG tablet Take 1 tablet (5 mg total) by mouth at bedtime. For memory 02/09/24   Eustaquio Boyden, MD  famotidine (PEPCID) 20 MG tablet Take 20 mg by mouth daily.    [provider]  FLUoxetine (PROZAC) 40 MG capsule Take 1 capsule (40 mg total) by mouth daily. 05/19/23   Eustaquio Boyden, MD  hydrALAZINE (APRESOLINE) 25 MG tablet TAKE 1 TABLET IN THE MORNING AND AT BEDTIME. 05/19/23   Eustaquio Boyden, MD  Iron, Ferrous Sulfate, 325 (65 Fe) MG TABS Take 325 mg by mouth every Monday, Wednesday, and Friday. 05/19/23   Eustaquio Boyden, MD  levothyroxine (SYNTHROID) 88 MCG tablet TAKE 1 TABLET EVERY DAY BEFORE BREAKFAST Patient taking differently: Take 88 mcg by mouth daily before breakfast. 05/19/23   Eustaquio Boyden, MD  lisinopril (ZESTRIL) 40 MG tablet Take 1 tablet (40 mg total) by mouth daily. Patient taking differently: Take 40 mg by mouth in the morning. 05/19/23   Eustaquio Boyden, MD  loratadine (CLARITIN) 10 MG tablet Take 1 tablet (10 mg total) by mouth every evening. 05/19/23   Eustaquio Boyden, MD  metoprolol succinate (TOPROL-XL) 25 MG 24 hr tablet Take 1 tablet (25 mg total) by mouth daily. Patient taking differently: Take 25 mg by mouth at bedtime. 05/19/23   Eustaquio Boyden, MD  mirabegron  ER (MYRBETRIQ) 25 MG TB24 tablet Take 1 tablet (25 mg total) by mouth daily. 03/12/24   Eustaquio Boyden, MD  nystatin cream (MYCOSTATIN) Apply 1 Application topically 2 (two) times daily. 03/10/24   Eustaquio Boyden, MD  omeprazole (PRILOSEC) 40 MG capsule Take 1 capsule (40 mg total) by mouth daily. Patient taking differently: Take 40 mg by mouth daily before breakfast. 05/19/23   Eustaquio Boyden, MD  potassium chloride (KLOR-CON M) 10 MEQ tablet Take 1 tablet (10 mEq  total) by mouth daily. 08/07/23   Eustaquio Boyden, MD  pravastatin (PRAVACHOL) 40 MG tablet Take 1 tablet (40 mg total) by mouth daily. Patient taking differently: Take 40 mg by mouth at bedtime. 05/19/23   Eustaquio Boyden, MD  Propylene Glycol (SYSTANE BALANCE OP) Place 1 drop into both eyes 3 (three) times daily as needed (for dryness).    [provider]  torsemide (DEMADEX) 10 MG tablet Take 1 tablet (10 mg total) by mouth daily. 03/22/24   Eustaquio Boyden, MD  verapamil (CALAN-SR) 240 MG CR tablet Take 1 tablet (240 mg total) by mouth daily. Patient taking differently: Take 240 mg by mouth at bedtime. 05/19/23   Eustaquio Boyden, MD  vitamin B-12 (CYANOCOBALAMIN) 500 MCG tablet Take 1 tablet (500 mcg total) by mouth once a week. Patient taking differently: Take 250 mcg by mouth every Thursday. 03/08/20   Eustaquio Boyden, MD      Allergies    Trazodone and nefazodone    Review of Systems   Review of Systems  All other systems reviewed and are negative.   Physical Exam Updated Vital Signs BP (!) 131/58   Pulse (!) 55   Temp 98.8 F (37.1 C)   Resp 18   SpO2 96%  Physical Exam Vitals and nursing note reviewed. Exam conducted with a chaperone present.  Constitutional:      General: She is not in acute distress.    Appearance: She is well-developed.  HENT:     Head: Normocephalic and atraumatic.  Eyes:     Conjunctiva/sclera: Conjunctivae normal.  Cardiovascular:     Rate and Rhythm: Normal rate and regular rhythm.     Heart sounds: No murmur heard. Pulmonary:     Effort: Pulmonary effort is normal. No respiratory distress.     Breath sounds: Normal breath sounds.  Abdominal:     Palpations: Abdomen is soft.     Tenderness: There is no abdominal tenderness. There is no guarding.  Genitourinary:    Comments: External hemorrhoids present. Adequate anal sphincter tone. Small amount of clay colored stool obtained on rectal exam. No clinical evidence of acute  infection. Musculoskeletal:        General: No swelling.     Cervical back: Neck supple.  Skin:    General: Skin is warm and dry.     Capillary Refill: Capillary refill takes less than 2 seconds.  Neurological:     Mental Status: She is alert.  Psychiatric:        Mood and Affect: Mood normal.     ED Results / Procedures / Treatments   Labs (all labs ordered are listed, but only abnormal results are displayed) Labs Reviewed - No data to display  EKG None  Radiology No results found.  Procedures Procedures    Medications Ordered in ED Medications - No data to display  ED Course/ Medical Decision Making/ A&P  Medical Decision Making Amount and/or Complexity of Data Reviewed Radiology: ordered.  Risk OTC drugs.   This patient presents to the ED for concern of constipation, this involves an extensive number of treatment options, and is a complaint that carries with it a high risk of complications and morbidity.  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  Degenerative change. I agree with the radiologist interpretation  Cardiac Monitoring: / EKG:  The patient was maintained on a cardiac monitor.  I personally viewed and interpreted the cardiac monitored which showed an underlying rhythm of: sinus rhythm   Consultations Obtained:  I  requested consultation with attending physician Dr. Eudelia Bunch who was in agreement with treatment plan going forward.    Problem List / ED Course / Critical interventions / Medication management  Feelings of constipation I ordered medication including soap suds enema   Reevaluation of the patient after these medicines showed that the patient improved I have reviewed the patients home medicines and have made adjustments as needed   Social Determinants of Health:  Former cigarette use.  Denies illicit drug use.   Test / Admission - Considered:  Feelings of constipation Vitals signs within normal range and stable throughout visit. Laboratory/imaging studies significant for: see above 89 year old female presents to the ED with complaints of constipation.  Patient states that she has felt more constipated over the past 3-4 days.  States that she still been able to have bowel movements about every day but they have been smaller and more firm in consistency.  This states that she tried to use Colace 1 day with some improvement of symptoms.  States that she feels like she is not completely emptying her bowels.  Denies any fevers, chills, nausea, vomiting, abdominal/rectal pain.  Denies any hematochezia/melena.  States she had history of similar symptoms but it was "several years ago. "States that she was given a medicine to help with constipation which significantly improved symptoms. On exam, no abdominal tenderness.  No evidence clinically of fecal impaction.  Abdominal x-ray showed nondilated gas-filled large bowel with scattered stool.  Shared decision making conversation was had with patient regarding trial of enema which she elected for.  Awaiting for patient response to medications.  The patient had a significant improvement feelings of constipation, will recommend discharge with adequate hydration with MiraLAX titrated to effect and daily fiber supplementation.  If patient still without  significant relief, will expand workup to involve laboratory studies as well as CT imaging of patient's abdomen/pelvis.  At time of shift change, patient care handed off to PA-C Small.  Patient stable upon shift change.        Final Clinical Impression(s) / ED Diagnoses Final diagnoses:  None    Rx / DC Orders ED Discharge Orders     None         Peter Garter, [place] 03/31/24 3244    Nira Conn, MD 03/31/24 [phone]

## 2024-03-31 NOTE — ED Triage Notes (Signed)
 Pt arrived via [facility] from [facility]. Pt c/o constipation for 3 days. BP 127/72, HR 50; O2 98%  RA, cbg 127.

## 2024-03-31 NOTE — Discharge Instructions (Addendum)
 You are very constipated, you do not have a bowel obstruction which is reassuring.  Please follow-up with your primary care doctor, make sure you are taking MiraLAX daily, to help with your stools.  Also increase your water intake.  Return to the ER if you are unable to have a bowel movement, or pass gas, after 3 to 4 days or have severe nausea, vomiting

## 2024-04-01 ENCOUNTER — Telehealth: Payer: Self-pay

## 2024-04-01 LAB — PROTEIN ELECTROPHORESIS, SERUM, WITH REFLEX
Albumin ELP: 3.1 g/dL — ABNORMAL LOW (ref 3.8–4.8)
Alpha 1: 0.4 g/dL — ABNORMAL HIGH (ref 0.2–0.3)
Alpha 2: 0.7 g/dL (ref 0.5–0.9)
Beta 2: 0.3 g/dL (ref 0.2–0.5)
Beta Globulin: 0.4 g/dL (ref 0.4–0.6)
Gamma Globulin: 0.9 g/dL (ref 0.8–1.7)
Total Protein: 5.8 g/dL — ABNORMAL LOW (ref 6.1–8.1)

## 2024-04-01 LAB — ANTI-NUCLEAR AB-TITER (ANA TITER): ANA Titer 1: 1:80 {titer} — ABNORMAL HIGH

## 2024-04-01 LAB — PARATHYROID HORMONE, INTACT (NO CA): PTH: 34 pg/mL (ref 16–77)

## 2024-04-01 LAB — ANA: Anti Nuclear Antibody (ANA): POSITIVE — AB

## 2024-04-01 NOTE — Transitions of Care (Post Inpatient/ED Visit) (Signed)
 04/01/2024  Name: [name] MRN: [id number] DOB: [birth year]  Today's TOC FU Call Status: Today's TOC FU Call Status:: Successful TOC FU Call Completed TOC FU Call Complete Date: 04/01/24 Patient's Name and Date of Birth confirmed.  Transition Care Management Follow-up Telephone Call Date of Discharge: 03/31/24 Discharge Facility: [facility]) Type of Discharge: Emergency Department Reason for ED Visit: Other: (Constipation) How have you been since you were released from the hospital?: Better Any questions or concerns?: No  Items Reviewed: Did you receive and understand the discharge instructions provided?: Yes Medications obtained,verified, and reconciled?: Yes (Medications Reviewed) Any new allergies since your discharge?: No Dietary orders reviewed?: NA Do you have support at home?: Yes People in Home: child(ren), adult  Medications Reviewed Today: Medications Reviewed Today     Reviewed by Anthoney Harada, LPN (Licensed Practical Nurse) on 04/01/24 at 1135  Med List Status: <None>   Medication Order Taking? Sig Documenting Provider Last Dose Status Informant  acetaminophen (TYLENOL) 500 MG tablet [id number] Yes Take 1,000 mg by mouth See admin instructions. Take 1,000 mg by mouth in the morning and an additional 1,000 mg once a day as needed for pain [provider] Taking Active Family Member  ascorbic acid (VITAMIN C) 500 MG tablet [id number] Yes Take 1 tablet (500 mg total) by mouth every Monday, Wednesday, and Friday. With iron Eustaquio Boyden, MD Taking Active   calcium gluconate 500 MG tablet [id number] Yes Take 1 tablet (500 mg total) by mouth daily. Eustaquio Boyden, MD Taking Active Family Member  cholecalciferol (VITAMIN D) 1000 UNITS tablet [id number] Yes Take 1,000 Units by mouth daily. [provider] Taking Active Family Member  denosumab (PROLIA) 60 MG/ML SOSY injection [id number] Yes Inject 60 mg into the skin every 6 (six) months. Patient  scheduled for 06/11/23 Eustaquio Boyden, MD Taking Active Family Member  denosumab [facility]) injection 60 mg [id number]   Eustaquio Boyden, MD  Active   docusate sodium (COLACE) 100 MG capsule [id number] Yes Take 1 capsule (100 mg total) by mouth daily as needed for mild constipation or moderate constipation. Eustaquio Boyden, MD Taking Active            Med Note Lorenda Hatchet, Maki Hege B   Thu Apr 01, 2024 11:35 AM) Taking every other day   donepezil (ARICEPT) 5 MG tablet [id number] Yes Take 1 tablet (5 mg total) by mouth at bedtime. For memory Eustaquio Boyden, MD Taking Active   famotidine (PEPCID) 20 MG tablet [id number] Yes Take 20 mg by mouth daily. [provider] Taking Active Family Member  FLUoxetine (PROZAC) 40 MG capsule [id number] Yes Take 1 capsule (40 mg total) by mouth daily. Eustaquio Boyden, MD Taking Active Family Member  hydrALAZINE (APRESOLINE) 25 MG tablet [id number] Yes TAKE 1 TABLET IN THE MORNING AND AT BEDTIME. Eustaquio Boyden, MD Taking Active Family Member  Iron, Ferrous Sulfate, 325 (65 Fe) MG TABS [id number] Yes Take 325 mg by mouth every Monday, Wednesday, and Friday. Eustaquio Boyden, MD Taking Active Family Member  levothyroxine (SYNTHROID) 88 MCG tablet [id number] Yes TAKE 1 TABLET EVERY DAY BEFORE BREAKFAST  Patient taking differently: Take 88 mcg by mouth daily before breakfast.   Eustaquio Boyden, MD Taking Active Family Member  lisinopril (ZESTRIL) 40 MG tablet [id number] Yes Take 1 tablet (40 mg total) by mouth daily.  Patient taking differently: Take 40 mg by mouth in the morning.   Eustaquio Boyden, MD Taking Active Family Member  loratadine (CLARITIN) 10 MG tablet [id number] Yes Take  1 tablet (10 mg total) by mouth every evening. Eustaquio Boyden, MD Taking Active Family Member  metoprolol succinate (TOPROL-XL) 25 MG 24 hr tablet [id number] Yes Take 1 tablet (25 mg total) by mouth daily.  Patient taking differently: Take 25 mg by mouth at bedtime.    Eustaquio Boyden, MD Taking Active Family Member  mirabegron ER [facility]) 25 MG TB24 tablet [id number] Yes Take 1 tablet (25 mg total) by mouth daily. Eustaquio Boyden, MD Taking Active   nystatin cream [name]) [id number] Yes Apply 1 Application topically 2 (two) times daily. Eustaquio Boyden, MD Taking Active   omeprazole (PRILOSEC) 40 MG capsule [id number] Yes Take 1 capsule (40 mg total) by mouth daily.  Patient taking differently: Take 40 mg by mouth daily before breakfast.   Eustaquio Boyden, MD Taking Active Family Member  polyethylene glycol (MIRALAX) 17 g packet [id number] Yes Take 17 g by mouth daily. Small, Brooke L, PA Taking Active   potassium chloride (KLOR-CON M) 10 MEQ tablet [id number] Yes Take 1 tablet (10 mEq total) by mouth daily. Eustaquio Boyden, MD Taking Active   pravastatin (PRAVACHOL) 40 MG tablet [id number] Yes Take 1 tablet (40 mg total) by mouth daily.  Patient taking differently: Take 40 mg by mouth at bedtime.   Eustaquio Boyden, MD Taking Active Family Member  Propylene Glycol [facility] BALANCE OP) [id number] Yes Place 1 drop into both eyes 3 (three) times daily as needed (for dryness). [provider] Taking Active Family Member  torsemide (DEMADEX) 10 MG tablet [id number] Yes Take 1 tablet (10 mg total) by mouth daily. Eustaquio Boyden, MD Taking Active   verapamil (CALAN-SR) 240 MG CR tablet [id number] Yes Take 1 tablet (240 mg total) by mouth daily.  Patient taking differently: Take 240 mg by mouth at bedtime.   Eustaquio Boyden, MD Taking Active Family Member  vitamin B-12 (CYANOCOBALAMIN) 500 MCG tablet [id number] Yes Take 1 tablet (500 mcg total) by mouth once a week.  Patient taking differently: Take 250 mcg by mouth every Thursday.   Eustaquio Boyden, MD Taking Active Family Member            Home Care and Equipment/Supplies: Were Home Health Services Ordered?: NA Any new equipment or medical supplies ordered?: NA  Functional  Questionnaire: Do you need assistance with bathing/showering or dressing?: Yes Do you need assistance with meal preparation?: Yes Do you need assistance with eating?: No Do you have difficulty maintaining continence: Yes Do you need assistance with getting out of bed/getting out of a chair/moving?: Yes Do you have difficulty managing or taking your medications?: Yes  Follow up appointments reviewed: PCP Follow-up appointment confirmed?: Yes Date of PCP follow-up appointment?: 04/07/24 Follow-up Provider: Dr. Sharen Hones [facility] Follow-up appointment confirmed?: NA Do you need transportation to your follow-up appointment?: No Do you understand care options if your condition(s) worsen?: Yes-patient verbalized understanding    SIGNATURE Kandis Fantasia, LPN [facility] Health Advisor  l [facility] You Are. We Are. [facility] Direct Dial [phone]

## 2024-04-07 ENCOUNTER — Encounter: Payer: Self-pay | Admitting: Family Medicine

## 2024-04-07 ENCOUNTER — Ambulatory Visit (INDEPENDENT_AMBULATORY_CARE_PROVIDER_SITE_OTHER): Admitting: Family Medicine

## 2024-04-07 VITALS — BP 124/64 | HR 67 | Temp 97.6°F | Ht 61.0 in | Wt 193.4 lb

## 2024-04-07 DIAGNOSIS — I5032 Chronic diastolic (congestive) heart failure: Secondary | ICD-10-CM | POA: Diagnosis not present

## 2024-04-07 DIAGNOSIS — K6289 Other specified diseases of anus and rectum: Secondary | ICD-10-CM

## 2024-04-07 DIAGNOSIS — N949 Unspecified condition associated with female genital organs and menstrual cycle: Secondary | ICD-10-CM | POA: Diagnosis not present

## 2024-04-07 DIAGNOSIS — N1832 Chronic kidney disease, stage 3b: Secondary | ICD-10-CM

## 2024-04-07 DIAGNOSIS — R600 Localized edema: Secondary | ICD-10-CM | POA: Diagnosis not present

## 2024-04-07 DIAGNOSIS — K5900 Constipation, unspecified: Secondary | ICD-10-CM | POA: Diagnosis not present

## 2024-04-07 MED ORDER — LISINOPRIL 20 MG PO TABS: 20.0000 mg | ORAL_TABLET | Freq: Every day | ORAL | 1 refills | Status: DC

## 2024-04-07 NOTE — Progress Notes (Signed)
 Ph: [phone] Fax: [phone]   Patient ID: [name], female    DOB: 1930/01/30, 89 y.o.   MRN: [id number]  This visit was conducted in person.  BP 124/64   Pulse 67   Temp 97.6 F (36.4 C) (Oral)   Ht 5\' 1"  (1.549 m)   Wt 193 lb 6 oz (87.7 kg)   SpO2 96%   BMI 36.54 kg/m   BP Readings from Last 3 Encounters:  04/07/24 124/64  03/31/24 (!) 153/62  03/22/24 (!) 170/74   CC: 2 wk f/u visit  Subjective:   HPI: [name] is a 89 y.o. female presenting on 04/07/2024 for Medical Management of Chronic Issues (Here for 2 wk edema f/u. Also, wants to discuss Prolia inj. Pt accompanied by daughter, [name]. )   Here for follow up of pedal edema and worsening kidney function.  She had been taking lasix 40mg  daily without significant UOP so we changed to torsemide 10mg  daily - she feels good UOP with this, 8 lb weight loss since last visit.  Has checked home BP's but doesn't remember #s- asked to start keeping track.   New sitter Kim started 03/30/2024 - 2d/wk from 11am to 3pm. This has led to increased physical activity. She has also been massaging Clio's legs with benefit.  Recent ER visit for constipation.  Hospital records reviewed. Med rec performed.  Non-contrasted CT abd/pelvis showed no bowel obstruction or significant stool burden. There was moderate wall thickening of rectum suggesting inflammation, unable to r/o neoplasm. Incidentally noted 5.5 cm R adnexal cyst -recommend rpt pelvic US in 6-12 months to monitor.  Lab Results  Component Value Date   NA 139 03/31/2024   CL 104 03/31/2024   K 4.5 03/31/2024   CO2 26 03/31/2024   BUN 30 (H) 03/31/2024   CREATININE 2.36 (H) 03/31/2024   GFRNONAA 19 (L) 03/31/2024   CALCIUM 9.9 03/31/2024   PHOS 3.8 03/22/2024   ALBUMIN 3.6 03/22/2024   GLUCOSE 99 03/31/2024  She denies R pelvic pain.  She states she is feeling much better.  Having 1 BM every other day.  Now taking colace 100mg  every other day.    COLONOSCOPY WITH PROPOFOL 03/14/2020 - single bleeding colonic angiectasia treated with APC (Pyrtle, Carie Caddy, MD)     Relevant past medical, surgical, family and social history reviewed and updated as indicated. Interim medical history since our last visit reviewed. Allergies and medications reviewed and updated. Outpatient Medications Prior to Visit  Medication Sig Dispense Refill   acetaminophen (TYLENOL) 500 MG tablet Take 1,000 mg by mouth See admin instructions. Take 1,000 mg by mouth in the morning and an additional 1,000 mg once a day as needed for pain     ascorbic acid (VITAMIN C) 500 MG tablet Take 1 tablet (500 mg total) by mouth every Monday, Wednesday, and Friday. With iron     calcium gluconate 500 MG tablet Take 1 tablet (500 mg total) by mouth daily.     cholecalciferol (VITAMIN D) 1000 UNITS tablet Take 1,000 Units by mouth daily.     denosumab (PROLIA) 60 MG/ML SOSY injection Inject 60 mg into the skin every 6 (six) months. Patient scheduled for 06/11/23 1 mL 0   docusate sodium (COLACE) 100 MG capsule Take 1 capsule (100 mg total) by mouth daily as needed for mild constipation or moderate constipation.     donepezil (ARICEPT) 5 MG tablet Take 1 tablet (5 mg total) by mouth at bedtime.  For memory 90 tablet 1   famotidine (PEPCID) 20 MG tablet Take 20 mg by mouth daily.     FLUoxetine (PROZAC) 40 MG capsule Take 1 capsule (40 mg total) by mouth daily. 90 capsule 4   hydrALAZINE (APRESOLINE) 25 MG tablet TAKE 1 TABLET IN THE MORNING AND AT BEDTIME. 180 tablet 4   Iron, Ferrous Sulfate, 325 (65 Fe) MG TABS Take 325 mg by mouth every Monday, Wednesday, and Friday.     levothyroxine (SYNTHROID) 88 MCG tablet TAKE 1 TABLET EVERY DAY BEFORE BREAKFAST (Patient taking differently: Take 88 mcg by mouth daily before breakfast.) 90 tablet 4   loratadine (CLARITIN) 10 MG tablet Take 1 tablet (10 mg total) by mouth every evening. 90 tablet 4   metoprolol succinate (TOPROL-XL) 25 MG 24 hr  tablet Take 1 tablet (25 mg total) by mouth daily. (Patient taking differently: Take 25 mg by mouth at bedtime.) 90 tablet 4   mirabegron ER (MYRBETRIQ) 25 MG TB24 tablet Take 1 tablet (25 mg total) by mouth daily. 90 tablet 3   nystatin cream (MYCOSTATIN) Apply 1 Application topically 2 (two) times daily. 60 g 0   omeprazole (PRILOSEC) 40 MG capsule Take 1 capsule (40 mg total) by mouth daily. (Patient taking differently: Take 40 mg by mouth daily before breakfast.) 90 capsule 4   polyethylene glycol (MIRALAX) 17 g packet Take 17 g by mouth daily. 14 each 0   potassium chloride (KLOR-CON M) 10 MEQ tablet Take 1 tablet (10 mEq total) by mouth daily. 90 tablet 3   pravastatin (PRAVACHOL) 40 MG tablet Take 1 tablet (40 mg total) by mouth daily. (Patient taking differently: Take 40 mg by mouth at bedtime.) 90 tablet 4   Propylene Glycol (SYSTANE BALANCE OP) Place 1 drop into both eyes 3 (three) times daily as needed (for dryness).     torsemide (DEMADEX) 10 MG tablet Take 1 tablet (10 mg total) by mouth daily. 30 tablet 1   verapamil (CALAN-SR) 240 MG CR tablet Take 1 tablet (240 mg total) by mouth daily. (Patient taking differently: Take 240 mg by mouth at bedtime.) 90 tablet 4   vitamin B-12 (CYANOCOBALAMIN) 500 MCG tablet Take 1 tablet (500 mcg total) by mouth once a week. (Patient taking differently: Take 250 mcg by mouth every Thursday.)     lisinopril (ZESTRIL) 40 MG tablet Take 1 tablet (40 mg total) by mouth daily. (Patient taking differently: Take 40 mg by mouth in the morning.) 90 tablet 4   Facility-Administered Medications Prior to Visit  Medication Dose Route Frequency Provider Last Rate Last Admin   denosumab (PROLIA) injection 60 mg  60 mg Subcutaneous Once Claire Crick, MD         Per HPI unless specifically indicated in ROS section below Review of Systems  Objective:  BP 124/64   Pulse 67   Temp 97.6 F (36.4 C) (Oral)   Ht 5\' 1"  (1.549 m)   Wt 193 lb 6 oz (87.7 kg)    SpO2 96%   BMI 36.54 kg/m   Wt Readings from Last 3 Encounters:  04/07/24 193 lb 6 oz (87.7 kg)  03/31/24 201 lb 4.5 oz (91.3 kg)  03/22/24 201 lb 4 oz (91.3 kg)      Physical Exam Vitals and nursing note reviewed.  Constitutional:      Appearance: Normal appearance. She is not ill-appearing.     Comments: Ambulates with walker  Eyes:     Extraocular Movements: Extraocular movements intact.  Pupils: Pupils are equal, round, and reactive to light.  Cardiovascular:     Rate and Rhythm: Normal rate and regular rhythm.     Pulses: Normal pulses.     Heart sounds: Normal heart sounds. No murmur heard. Pulmonary:     Effort: Pulmonary effort is normal. No respiratory distress.     Breath sounds: Normal breath sounds. No wheezing, rhonchi or rales.  Musculoskeletal:        General: Swelling and tenderness present.     Right lower leg: Edema present.     Left lower leg: Edema present.     Comments: Taut swelling BLE  Skin:    General: Skin is warm and dry.     Findings: No erythema or rash.  Neurological:     Mental Status: She is alert.  Psychiatric:        Mood and Affect: Mood normal.        Behavior: Behavior normal.       Results for orders placed or performed during the hospital encounter of 03/31/24  CBC with Differential   Collection Time: 03/31/24  8:48 AM  Result Value Ref Range   WBC 8.3 4.0 - 10.5 K/uL   RBC 3.82 (L) 3.87 - 5.11 MIL/uL   Hemoglobin 11.1 (L) 12.0 - 15.0 g/dL   HCT 95.2 84.1 - 32.4 %   MCV 97.4 80.0 - 100.0 fL   MCH 29.1 26.0 - 34.0 pg   MCHC 29.8 (L) 30.0 - 36.0 g/dL   RDW 40.1 02.7 - 25.3 %   Platelets 254 150 - 400 K/uL   nRBC 0.0 0.0 - 0.2 %   Neutrophils Relative % 66 %   Neutro Abs 5.6 1.7 - 7.7 K/uL   Lymphocytes Relative 20 %   Lymphs Abs 1.6 0.7 - 4.0 K/uL   Monocytes Relative 9 %   Monocytes Absolute 0.7 0.1 - 1.0 K/uL   Eosinophils Relative 3 %   Eosinophils Absolute 0.3 0.0 - 0.5 K/uL   Basophils Relative 1 %   Basophils  Absolute 0.1 0.0 - 0.1 K/uL   Immature Granulocytes 1 %   Abs Immature Granulocytes 0.04 0.00 - 0.07 K/uL  Basic metabolic panel   Collection Time: 03/31/24  8:48 AM  Result Value Ref Range   Sodium 139 135 - 145 mmol/L   Potassium 4.5 3.5 - 5.1 mmol/L   Chloride 104 98 - 111 mmol/L   CO2 26 22 - 32 mmol/L   Glucose, Bld 99 70 - 99 mg/dL   BUN 30 (H) 8 - 23 mg/dL   Creatinine, Ser 6.64 (H) 0.44 - 1.00 mg/dL   Calcium 9.9 8.9 - 40.3 mg/dL   GFR, Estimated 19 (L) >60 mL/min   Anion gap 9 5 - 15    Assessment & Plan:   Problem List Items Addressed This Visit     CKD (chronic kidney disease) stage 3, GFR 30-59 ml/min (HCC)   Recent deteriorated kidney function with GFR down to teens.  Drop lisinopril to 20mg  daily.  Now on torsemide 10mg  in place of lasix.  I did ask her to start recording BP's to review control.  Reassess control next visit.       Chronic diastolic CHF (congestive heart failure) (HCC)   8 lb weight loss over the past few weeks with transition to torsemide - continue.       Relevant Medications   lisinopril (ZESTRIL) 20 MG tablet   Constipation   Recent ER notes  reviewed.  No significant stool burden on CT.  She notes symptomatic improvement with 1 BM every other day  Reviewed bowel regimen - continue colace daily to every other day, with PRN miralax as needed.       Bilateral lower extremity edema - Primary   Persistent, albeit some improvement with switch to torsemide + increased massaging at home.  Continue torsemide      Adnexal cyst   5.5cm R adnexal cyst incidentally noted. Pt asxs. Discussed to consider rpt imaging with pelvic US  in 6-12 months.       Rectal inflammation   Moderate rectal wall thickening incidentally noted on imaging associated with constipation, unable to r/o neoplasm. Pt states symptoms are better, declines luminal evaluation at this time.  Will continue to monitor.         Meds ordered this encounter  Medications    lisinopril (ZESTRIL) 20 MG tablet    Sig: Take 1 tablet (20 mg total) by mouth daily.    Dispense:  90 tablet    Refill:  1    Note new dose    No orders of the defined types were placed in this encounter.   Patient Instructions  Drop lisinopril to 20mg  daily - may take 1/2 tablet of 40mg  tablets daily until you run out. New 20mg  dose sent to pharmacy.  Continue torsemide 10mg  daily.  Start keeping track of blood pressures at home - blood pressure log sheet provided today.   For constipation - may continue colace 100mg  daily to every other day. If needed could add miralax powder 1/2-1 capful daily in 8oz of fluid.   Return in 1 month for follow up visit.   Follow up plan: Return in about 1 month (around 05/07/2024), or if symptoms worsen or fail to improve, for follow up visit.  Claire Crick, MD

## 2024-04-07 NOTE — Patient Instructions (Addendum)
 Drop lisinopril to 20mg  daily - may take 1/2 tablet of 40mg  tablets daily until you run out. New 20mg  dose sent to pharmacy.  Continue torsemide 10mg  daily.  Start keeping track of blood pressures at home - blood pressure log sheet provided today.   For constipation - may continue colace 100mg  daily to every other day. If needed could add miralax powder 1/2-1 capful daily in 8oz of fluid.   Return in 1 month for follow up visit.

## 2024-04-10 ENCOUNTER — Encounter: Payer: Self-pay | Admitting: Family Medicine

## 2024-04-10 DIAGNOSIS — K6289 Other specified diseases of anus and rectum: Secondary | ICD-10-CM | POA: Insufficient documentation

## 2024-04-10 DIAGNOSIS — N949 Unspecified condition associated with female genital organs and menstrual cycle: Secondary | ICD-10-CM | POA: Insufficient documentation

## 2024-04-10 NOTE — Assessment & Plan Note (Addendum)
 Recent ER notes reviewed.  No significant stool burden on CT.  She notes symptomatic improvement with 1 BM every other day  Reviewed bowel regimen - continue colace daily to every other day, with PRN miralax as needed.

## 2024-04-10 NOTE — Assessment & Plan Note (Signed)
 5.5cm R adnexal cyst incidentally noted. Pt asxs. Discussed to consider rpt imaging with pelvic US  in 6-12 months.

## 2024-04-10 NOTE — Assessment & Plan Note (Addendum)
 8 lb weight loss over the past few weeks with transition to torsemide - continue.

## 2024-04-10 NOTE — Assessment & Plan Note (Addendum)
 Recent deteriorated kidney function with GFR down to teens.  Drop lisinopril to 20mg  daily.  Now on torsemide 10mg  in place of lasix.  I did ask her to start recording BP's to review control.  Reassess control next visit.

## 2024-04-10 NOTE — Assessment & Plan Note (Signed)
 Persistent, albeit some improvement with switch to torsemide + increased massaging at home.  Continue torsemide

## 2024-04-10 NOTE — Assessment & Plan Note (Signed)
 Moderate rectal wall thickening incidentally noted on imaging associated with constipation, unable to r/o neoplasm. Pt states symptoms are better, declines luminal evaluation at this time.  Will continue to monitor.

## 2024-04-19 ENCOUNTER — Ambulatory Visit: Payer: Medicare HMO | Attending: Cardiovascular Disease | Admitting: Cardiovascular Disease

## 2024-04-20 ENCOUNTER — Encounter: Payer: Self-pay | Admitting: Cardiovascular Disease

## 2024-05-05 ENCOUNTER — Encounter: Payer: Self-pay | Admitting: Family Medicine

## 2024-05-05 ENCOUNTER — Ambulatory Visit (INDEPENDENT_AMBULATORY_CARE_PROVIDER_SITE_OTHER): Admitting: Family Medicine

## 2024-05-05 VITALS — BP 134/70 | HR 74 | Temp 98.0°F | Ht 61.0 in | Wt 203.1 lb

## 2024-05-05 DIAGNOSIS — I10 Essential (primary) hypertension: Secondary | ICD-10-CM | POA: Diagnosis not present

## 2024-05-05 DIAGNOSIS — R011 Cardiac murmur, unspecified: Secondary | ICD-10-CM

## 2024-05-05 DIAGNOSIS — R600 Localized edema: Secondary | ICD-10-CM | POA: Diagnosis not present

## 2024-05-05 DIAGNOSIS — N184 Chronic kidney disease, stage 4 (severe): Secondary | ICD-10-CM | POA: Diagnosis not present

## 2024-05-05 DIAGNOSIS — N1832 Chronic kidney disease, stage 3b: Secondary | ICD-10-CM | POA: Diagnosis not present

## 2024-05-05 DIAGNOSIS — M25511 Pain in right shoulder: Secondary | ICD-10-CM

## 2024-05-05 DIAGNOSIS — G8929 Other chronic pain: Secondary | ICD-10-CM | POA: Diagnosis not present

## 2024-05-05 MED ORDER — VERAPAMIL HCL ER 180 MG PO TBCR: 180.0000 mg | EXTENDED_RELEASE_TABLET | Freq: Every day | ORAL | 1 refills | Status: DC

## 2024-05-05 NOTE — Patient Instructions (Signed)
 Drop verapamil  to 180mg  nightly.  Continue other medicines. Labs today  For right shoulder pain - continue tylenol  1000mg  twice daily. Try exercises for frozen shoulder as able. You could see Dr Geralyn Knee sports medicine if not improving.

## 2024-05-05 NOTE — Progress Notes (Unsigned)
 Ph: [phone] Fax: [phone]   Patient ID: [name], female    DOB: 08/17/1930, 89 y.o.   MRN: [id number]  This visit was conducted in person.  BP 134/70   Pulse 74   Temp 98 F (36.7 C) (Oral)   Ht 5\' 1"  (1.549 m)   Wt 203 lb 2 oz (92.1 kg)   SpO2 98%   BMI 38.38 kg/m    CC: 1 mo f/u visit  Subjective:   HPI: [name] is a 89 y.o. female presenting on 05/05/2024 for Medical Management of Chronic Issues (Here for 1 mo f/u. C/o R shoulder pain. H/o injury due to fall about 12 yrs ago. Pt accompanied by daughter, [name]. )   [name] "[name]"  See prior note for details.  Brings BP log sheet which was reviewed - am readings range 130-150s/50-60s, PM readings run 120-155/50-70, HR 53-78.   Isolated reading to 85/34 on 04/19/2024 - they think she took both am and pm meds at the same time that day.   Current BP regimen - hydralazine  25mg  BID, lisinopril  20mg  daily, Toprol  XL 25mg  daily, verapamil  240mg  daily, and torsemide  10mg  daily.   Echocardiogram 11/2021 - EF 60-65%, normal wall motion, RV strain with mild MR, aortic sclerosis without AS.   Missed latest cardiology appt - they need to reschedule  10 lb weight gain noted in the past month. Daughter thinks this is because she's eating better with sitter.  Ongoing chronic leg swelling.  Some dyspnea.  She sleeps in a recliner for the past 4-5 months. Trouble breathing when supine. BNP = 318 (03/22/2024).   Notes ongoing R anterior shoulder pain. Remote fall with comminuted fracture 12 yrs ago - she had shoulder surgery for this. It can catch when she reaches forward. No recent falls.  04/2007 RIGHT SHOULDER - 2 VIEW:  Findings: Two views of the right shoulder show a comminuted fracture of the right humeral head neck. Very slight impaction is present. The [facility] joint is normally aligned.  IMPRESSION:  Comminuted fracture of the right humeral head neck.      Relevant past medical, surgical, family and social history  reviewed and updated as indicated. Interim medical history since our last visit reviewed. Allergies and medications reviewed and updated. Outpatient Medications Prior to Visit  Medication Sig Dispense Refill   acetaminophen  (TYLENOL ) 500 MG tablet Take 1,000 mg by mouth See admin instructions. Take 1,000 mg by mouth in the morning and an additional 1,000 mg once a day as needed for pain     ascorbic acid  (VITAMIN C ) 500 MG tablet Take 1 tablet (500 mg total) by mouth every Monday, Wednesday, and Friday. With iron      calcium  gluconate 500 MG tablet Take 1 tablet (500 mg total) by mouth daily.     cholecalciferol  (VITAMIN D ) 1000 UNITS tablet Take 1,000 Units by mouth daily.     denosumab  (PROLIA ) 60 MG/ML SOSY injection Inject 60 mg into the skin every 6 (six) months. Patient scheduled for 06/11/23 1 mL 0   docusate sodium  (COLACE) 100 MG capsule Take 1 capsule (100 mg total) by mouth daily as needed for mild constipation or moderate constipation.     donepezil  (ARICEPT ) 5 MG tablet Take 1 tablet (5 mg total) by mouth at bedtime. For memory 90 tablet 1   famotidine  (PEPCID ) 20 MG tablet Take 20 mg by mouth daily.     FLUoxetine  (PROZAC ) 40 MG capsule Take 1 capsule (40 mg  total) by mouth daily. 90 capsule 4   hydrALAZINE  (APRESOLINE ) 25 MG tablet TAKE 1 TABLET IN THE MORNING AND AT BEDTIME. 180 tablet 4   Iron , Ferrous Sulfate , 325 (65 Fe) MG TABS Take 325 mg by mouth every Monday, Wednesday, and Friday.     levothyroxine  (SYNTHROID ) 88 MCG tablet TAKE 1 TABLET EVERY DAY BEFORE BREAKFAST (Patient taking differently: Take 88 mcg by mouth daily before breakfast.) 90 tablet 4   lisinopril  (ZESTRIL ) 20 MG tablet Take 1 tablet (20 mg total) by mouth daily. 90 tablet 1   loratadine  (CLARITIN ) 10 MG tablet Take 1 tablet (10 mg total) by mouth every evening. 90 tablet 4   metoprolol  succinate (TOPROL -XL) 25 MG 24 hr tablet Take 1 tablet (25 mg total) by mouth daily. (Patient taking differently: Take 25 mg  by mouth at bedtime.) 90 tablet 4   mirabegron  ER (MYRBETRIQ ) 25 MG TB24 tablet Take 1 tablet (25 mg total) by mouth daily. 90 tablet 3   omeprazole  (PRILOSEC) 40 MG capsule Take 1 capsule (40 mg total) by mouth daily. (Patient taking differently: Take 40 mg by mouth daily before breakfast.) 90 capsule 4   polyethylene glycol (MIRALAX ) 17 g packet Take 17 g by mouth daily. 14 each 0   potassium chloride  (KLOR-CON  M) 10 MEQ tablet Take 1 tablet (10 mEq total) by mouth daily. 90 tablet 3   pravastatin  (PRAVACHOL ) 40 MG tablet Take 1 tablet (40 mg total) by mouth daily. (Patient taking differently: Take 40 mg by mouth at bedtime.) 90 tablet 4   Propylene Glycol (SYSTANE BALANCE OP) Place 1 drop into both eyes 3 (three) times daily as needed (for dryness).     vitamin B-12 (CYANOCOBALAMIN ) 500 MCG tablet Take 1 tablet (500 mcg total) by mouth once a week. (Patient taking differently: Take 250 mcg by mouth every Thursday.)     torsemide  (DEMADEX ) 10 MG tablet Take 1 tablet (10 mg total) by mouth daily. 30 tablet 1   verapamil  (CALAN -SR) 240 MG CR tablet Take 1 tablet (240 mg total) by mouth daily. (Patient taking differently: Take 240 mg by mouth at bedtime.) 90 tablet 4   nystatin  cream (MYCOSTATIN ) Apply 1 Application topically 2 (two) times daily. 60 g 0   Facility-Administered Medications Prior to Visit  Medication Dose Route Frequency Provider Last Rate Last Admin   denosumab  (PROLIA ) injection 60 mg  60 mg Subcutaneous Once Claire Crick, MD         Per HPI unless specifically indicated in ROS section below Review of Systems  Objective:  BP 134/70   Pulse 74   Temp 98 F (36.7 C) (Oral)   Ht 5\' 1"  (1.549 m)   Wt 203 lb 2 oz (92.1 kg)   SpO2 98%   BMI 38.38 kg/m   Wt Readings from Last 3 Encounters:  05/05/24 203 lb 2 oz (92.1 kg)  04/07/24 193 lb 6 oz (87.7 kg)  03/31/24 201 lb 4.5 oz (91.3 kg)      Physical Exam Vitals and nursing note reviewed.  Constitutional:       Appearance: Normal appearance. She is not ill-appearing.     Comments: Ambulates with walker Hard of hearing   HENT:     Mouth/Throat:     Mouth: Mucous membranes are moist.     Pharynx: Oropharynx is clear. No oropharyngeal exudate or posterior oropharyngeal erythema.  Eyes:     Extraocular Movements: Extraocular movements intact.     Pupils: Pupils are equal,  round, and reactive to light.  Cardiovascular:     Rate and Rhythm: Normal rate and regular rhythm.     Pulses: Normal pulses.     Heart sounds: Normal heart sounds. No murmur heard. Pulmonary:     Effort: Pulmonary effort is normal. No respiratory distress.     Breath sounds: Normal breath sounds. No wheezing, rhonchi or rales.  Musculoskeletal:        General: Swelling (nonpitting BLE) and tenderness present.     Right lower leg: Edema (tr) present.     Left lower leg: Edema (tr) present.     Comments: R shoulder - tender to palpation at posterior shoulder, no significant discomfort anteriorly. Markedly limited ROM with passive and active ROM with forward and lateral flexion past 90 degrees, due to pain and stiffness.  L shoulder with better ROM, less pain  Skin:    General: Skin is warm and dry.     Findings: No erythema or rash.  Neurological:     Mental Status: She is alert.  Psychiatric:        Mood and Affect: Mood normal.        Behavior: Behavior normal.       Results for orders placed or performed in visit on 05/05/24  Renal function panel   Collection Time: 05/05/24  3:44 PM  Result Value Ref Range   Sodium 141 135 - 145 mEq/L   Potassium 4.6 3.5 - 5.1 mEq/L   Chloride 106 96 - 112 mEq/L   CO2 28 19 - 32 mEq/L   Albumin 3.7 3.5 - 5.2 g/dL   BUN 28 (H) 6 - 23 mg/dL   Creatinine, Ser 1.61 (H) 0.40 - 1.20 mg/dL   Glucose, Bld 81 70 - 99 mg/dL   Phosphorus 3.5 2.3 - 4.6 mg/dL   GFR 09.60 (L) >45.40 mL/min   Calcium  9.9 8.4 - 10.5 mg/dL    Assessment & Plan:   Problem List Items Addressed This Visit      Hypertension   Chronic, stable on current regimen. ?component of CHF - will order updated echocardiogram  Given this concern, will drop verapamil  to 180mg  daily.  Continue lower lisinopril  20mg  daily dose.  Consider dropping torsemide  dose/frequency.       Relevant Medications   verapamil  (CALAN -SR) 180 MG CR tablet   torsemide  (DEMADEX ) 10 MG tablet   Other Relevant Orders   ECHOCARDIOGRAM COMPLETE   CKD (chronic kidney disease) stage 4, GFR 15-29 ml/min (HCC) - Primary   Persistent progression of CKD despite dropping lasix  dose, she continues torsemide  daily.  Update renal panel today.  See below re: BP med titration.  Will encourage increased water intake by 1 glass/day.       Relevant Orders   Renal function panel (Completed)   Systolic murmur   Presumed due to aortic sclerosis.  H/o RV strain.  Update echocardiogram.       Relevant Orders   ECHOCARDIOGRAM COMPLETE   Bilateral lower extremity edema   Chronic overall stable period.  No further stasis dermatitis.  Will trial lower torsemide  due to deteriorated kidney function.  Dropping verapamil  dose due to possible CHF component, update echo.       Relevant Orders   ECHOCARDIOGRAM COMPLETE   Chronic right shoulder pain   Remote comminuted fracture 2012 s/p shoulder surgery  Anticipate current pain due to post-traumatic osteoarthritis as well as developing frozen shoulder.  Supportive measures reviewed - increase tylenol  to 1000mg  bid Provided with  exercises from [facility] pt advisor for frozen shoulder Rec sports med f/u if ongoing pain.         Meds ordered this encounter  Medications   verapamil  (CALAN -SR) 180 MG CR tablet    Sig: Take 1 tablet (180 mg total) by mouth daily.    Dispense:  90 tablet    Refill:  1    Note new dose   torsemide  (DEMADEX ) 10 MG tablet    Sig: Take 1 tablet (10 mg total) by mouth every other day.    Dispense:  30 tablet    Refill:  3    Note new dose    Orders Placed This  Encounter  Procedures   Renal function panel   ECHOCARDIOGRAM COMPLETE    Standing Status:   Future    Expiration Date:   05/06/2025    Where should this test be performed:   [facility]    Does the patient weigh less than or greater than 250 lbs?:   Patient weighs less than 250 lbs    Perflutren DEFINITY (image enhancing agent) should be administered unless hypersensitivity or allergy exist:   Administer Perflutren    Reason for exam-Echo:   Murmur R01.1    Reason for exam-Echo:   Dyspnea  R06.00    Patient Instructions  Drop verapamil  to 180mg  nightly.  Continue other medicines. Labs today  For right shoulder pain - continue tylenol  1000mg  twice daily. Try exercises for frozen shoulder as able. You could see Dr Geralyn Knee sports medicine if not improving.   Follow up plan: No follow-ups on file.  Claire Crick, MD

## 2024-05-06 DIAGNOSIS — G8929 Other chronic pain: Secondary | ICD-10-CM | POA: Insufficient documentation

## 2024-05-06 LAB — RENAL FUNCTION PANEL
Albumin: 3.7 g/dL (ref 3.5–5.2)
BUN: 28 mg/dL — ABNORMAL HIGH (ref 6–23)
CO2: 28 meq/L (ref 19–32)
Calcium: 9.9 mg/dL (ref 8.4–10.5)
Chloride: 106 meq/L (ref 96–112)
Creatinine, Ser: 2.23 mg/dL — ABNORMAL HIGH (ref 0.40–1.20)
GFR: 18.76 mL/min — ABNORMAL LOW (ref >60.00)
Glucose, Bld: 81 mg/dL (ref 70–99)
Phosphorus: 3.5 mg/dL (ref 2.3–4.6)
Potassium: 4.6 meq/L (ref 3.5–5.1)
Sodium: 141 meq/L (ref 135–145)

## 2024-05-06 MED ORDER — TORSEMIDE 10 MG PO TABS: 10.0000 mg | ORAL_TABLET | ORAL | 3 refills | Status: AC

## 2024-05-06 NOTE — Assessment & Plan Note (Addendum)
 Chronic overall stable period.  No further stasis dermatitis.  Will trial lower torsemide  due to deteriorated kidney function.  Dropping verapamil  dose due to possible CHF component, update echo.

## 2024-05-06 NOTE — Assessment & Plan Note (Signed)
 Chronic, stable on current regimen. ?component of CHF - will order updated echocardiogram  Given this concern, will drop verapamil  to 180mg  daily.  Continue lower lisinopril  20mg  daily dose.  Consider dropping torsemide  dose/frequency.

## 2024-05-06 NOTE — Assessment & Plan Note (Addendum)
 Persistent progression of CKD despite dropping lasix  dose, she continues torsemide  daily.  Update renal panel today.  See below re: BP med titration.  Will encourage increased water intake by 1 glass/day.

## 2024-05-06 NOTE — Assessment & Plan Note (Signed)
 Presumed due to aortic sclerosis.  H/o RV strain.  Update echocardiogram.

## 2024-05-06 NOTE — Assessment & Plan Note (Signed)
 Remote comminuted fracture 2012 s/p shoulder surgery  Anticipate current pain due to post-traumatic osteoarthritis as well as developing frozen shoulder.  Supportive measures reviewed - increase tylenol  to 1000mg  bid Provided with exercises from [facility] pt advisor for frozen shoulder Rec sports med f/u if ongoing pain.

## 2024-05-17 ENCOUNTER — Other Ambulatory Visit: Payer: Self-pay | Admitting: Family Medicine

## 2024-05-17 NOTE — Telephone Encounter (Signed)
 Change in therapy. Per 03/22/24 OV notes, Lasix  changed to torsemide  10 mg daily. Then per 05/05/24, torsemide  10 mg changed to every other day. New rx sent 05/06/24, #30/3 refills to [facility] [address]  Request denied.

## 2024-05-22 ENCOUNTER — Other Ambulatory Visit: Payer: Self-pay | Admitting: Family Medicine

## 2024-05-22 DIAGNOSIS — E782 Mixed hyperlipidemia: Secondary | ICD-10-CM

## 2024-05-25 MED ORDER — LISINOPRIL 20 MG PO TABS: 20.0000 mg | ORAL_TABLET | Freq: Every day | ORAL | 3 refills | Status: DC

## 2024-06-09 ENCOUNTER — Telehealth: Payer: Self-pay

## 2024-06-09 ENCOUNTER — Other Ambulatory Visit: Payer: Self-pay | Admitting: Family Medicine

## 2024-06-09 DIAGNOSIS — F331 Major depressive disorder, recurrent, moderate: Secondary | ICD-10-CM

## 2024-06-09 DIAGNOSIS — K219 Gastro-esophageal reflux disease without esophagitis: Secondary | ICD-10-CM

## 2024-06-09 DIAGNOSIS — E039 Hypothyroidism, unspecified: Secondary | ICD-10-CM

## 2024-06-09 NOTE — Telephone Encounter (Signed)
 I spoke with pts daughter (DPR signed) and pts sitter has been on vacation this week and pt has missed taking some of her meds; no UTI symptoms and pt seems getting more forgetful than usual. Pt just turned 89. Also pt  SOB is the same as has been for 1 year;no worsening but pts daughter wants her evaluated. Pt is also walking less and pts daughter not sure why. Pts daughter said nothing urgent and might be better for pt to see PCP. Pt has appt to see Dr Crissie Dome on 06/14/24 at 3 PM with UC & ED precautions and pts daughter voiced understanding sending note to Dr Crissie Dome and Dr Malissa Se as Arlie Lain.

## 2024-06-09 NOTE — Telephone Encounter (Signed)
 E-scribed refills.  Plz schedule CPE and fasting labs (no food/drink- except water and/or blk coffee 5 hrs prior) for additional refills.

## 2024-06-10 ENCOUNTER — Ambulatory Visit: Admitting: Family Medicine

## 2024-06-10 NOTE — Telephone Encounter (Signed)
 Called pt and daughter stated that mom has a appt on 06/14/2024 and will schedule appt then

## 2024-06-10 NOTE — Telephone Encounter (Signed)
 Agree with recommendations. Thank you.  Upcoming cardiology appt 7/1

## 2024-06-14 ENCOUNTER — Ambulatory Visit: Admitting: Family Medicine

## 2024-06-14 ENCOUNTER — Encounter: Payer: Self-pay | Admitting: Family Medicine

## 2024-06-14 VITALS — BP 128/66 | HR 77 | Temp 98.2°F | Ht 61.0 in | Wt 202.1 lb

## 2024-06-14 DIAGNOSIS — R410 Disorientation, unspecified: Secondary | ICD-10-CM | POA: Diagnosis not present

## 2024-06-14 DIAGNOSIS — I10 Essential (primary) hypertension: Secondary | ICD-10-CM | POA: Diagnosis not present

## 2024-06-14 DIAGNOSIS — N184 Chronic kidney disease, stage 4 (severe): Secondary | ICD-10-CM

## 2024-06-14 DIAGNOSIS — M810 Age-related osteoporosis without current pathological fracture: Secondary | ICD-10-CM

## 2024-06-14 DIAGNOSIS — M5441 Lumbago with sciatica, right side: Secondary | ICD-10-CM

## 2024-06-14 DIAGNOSIS — M48061 Spinal stenosis, lumbar region without neurogenic claudication: Secondary | ICD-10-CM

## 2024-06-14 DIAGNOSIS — R8290 Unspecified abnormal findings in urine: Secondary | ICD-10-CM

## 2024-06-14 DIAGNOSIS — G3184 Mild cognitive impairment, so stated: Secondary | ICD-10-CM | POA: Diagnosis not present

## 2024-06-14 DIAGNOSIS — Z7189 Other specified counseling: Secondary | ICD-10-CM

## 2024-06-14 DIAGNOSIS — R413 Other amnesia: Secondary | ICD-10-CM

## 2024-06-14 DIAGNOSIS — I5032 Chronic diastolic (congestive) heart failure: Secondary | ICD-10-CM

## 2024-06-14 DIAGNOSIS — Z741 Need for assistance with personal care: Secondary | ICD-10-CM

## 2024-06-14 DIAGNOSIS — M5416 Radiculopathy, lumbar region: Secondary | ICD-10-CM

## 2024-06-14 DIAGNOSIS — R600 Localized edema: Secondary | ICD-10-CM

## 2024-06-14 DIAGNOSIS — Z7409 Other reduced mobility: Secondary | ICD-10-CM | POA: Diagnosis not present

## 2024-06-14 DIAGNOSIS — G8929 Other chronic pain: Secondary | ICD-10-CM

## 2024-06-14 DIAGNOSIS — N3941 Urge incontinence: Secondary | ICD-10-CM | POA: Diagnosis not present

## 2024-06-14 LAB — POC URINALSYSI DIPSTICK (AUTOMATED)
Bilirubin, UA: NEGATIVE
Blood, UA: NEGATIVE
Glucose, UA: NEGATIVE
Ketones, UA: NEGATIVE
Nitrite, UA: NEGATIVE
Protein, UA: NEGATIVE
Spec Grav, UA: 1.015 (ref 1.010–1.025)
Urobilinogen, UA: 0.2 U/dL
pH, UA: 6 (ref 5.0–8.0)

## 2024-06-14 MED ORDER — DONEPEZIL HCL 10 MG PO TABS: 10.0000 mg | ORAL_TABLET | Freq: Every day | ORAL | 1 refills | Status: AC

## 2024-06-14 NOTE — Patient Instructions (Addendum)
 Increase aricept  (donepezil ) to 10mg  nightly - new dose at pharmacy Urine test today to check urine protein levels.  Urinalysis today as well.  Labs today Return in 1-2 months for physical I will ask palliative care team to come out to house for evaluation.

## 2024-06-14 NOTE — Progress Notes (Unsigned)
 Ph: [phone] Fax: [phone]   Patient ID: [name], female    DOB: 1930/12/09, 89 y.o.   MRN: [id number]  This visit was conducted in person.  BP 128/66   Pulse 77   Temp 98.2 F (36.8 C) (Oral)   Ht 5' 1 (1.549 m)   Wt 202 lb 2 oz (91.7 kg)   SpO2 97%   BMI 38.19 kg/m   BP Readings from Last 3 Encounters:  06/14/24 128/66  05/05/24 134/70  04/07/24 124/64    CC: worsening memory  Subjective:   HPI: [name] is a 89 y.o. female presenting on 06/14/2024 for Medical Management of Chronic Issues (Here for worsening memory. Pt accompanied by daughter, [name]. )   [name] just turned 89 yo.  See prior notes for details.  [name] lives in independent living. She has a Comptroller 3 days a week. Daughter is very involved as well. They have taken over medication administration.   Sitter was on vacation this past week - pt missed 4 nights and 3 days of medicines. Worsening memory noted. Increased somnolence. Mild increase in dyspnea all over last few weeks. Notes leg pains and lower back pain worse when getting up from recliner.   No fevers/chills, cough, dysuria, abd or chest pain.   Echocardiogram 11/2021 - EF 60-65%, normal wall motion, RV strain with mild MR, aortic sclerosis without AS.  Pending updated echo ordered last visit - scheduled for 06/28/2024.  Upcoming cardiology appt 06/29/2024.   Weight overall stable from prior visit (203 lbs).   Brings BP log which was reviewed:   Overall adequate control on current regimen of hydralazine  25mg  bid, lisinopril  20mg  daily, Toprol  XL 25mg  daily, verapamil  CR 180mg  daily and torsemide  10mg  every other day.      Relevant past medical, surgical, family and social history reviewed and updated as indicated. Interim medical history since our last visit reviewed. Allergies and medications reviewed and updated. Outpatient Medications Prior to Visit  Medication Sig Dispense Refill   acetaminophen  (TYLENOL ) 500 MG tablet  Take 1,000 mg by mouth See admin instructions. Take 1,000 mg by mouth in the morning and an additional 1,000 mg once a day as needed for pain     ascorbic acid  (VITAMIN C ) 500 MG tablet Take 1 tablet (500 mg total) by mouth every Monday, Wednesday, and Friday. With iron      calcium  gluconate 500 MG tablet Take 1 tablet (500 mg total) by mouth daily.     cholecalciferol  (VITAMIN D ) 1000 UNITS tablet Take 1,000 Units by mouth daily.     denosumab  (PROLIA ) 60 MG/ML SOSY injection Inject 60 mg into the skin every 6 (six) months. Patient scheduled for 06/11/23 1 mL 0   docusate sodium  (COLACE) 100 MG capsule Take 1 capsule (100 mg total) by mouth daily as needed for mild constipation or moderate constipation.     famotidine  (PEPCID ) 20 MG tablet Take 20 mg by mouth daily.     FLUoxetine  (PROZAC ) 40 MG capsule TAKE 1 CAPSULE EVERY DAY 90 capsule 0   hydrALAZINE  (APRESOLINE ) 25 MG tablet TAKE 1 TABLET IN THE MORNING AND AT BEDTIME. 180 tablet 4   Iron , Ferrous Sulfate , 325 (65 Fe) MG TABS Take 325 mg by mouth every Monday, Wednesday, and Friday.     levothyroxine  (SYNTHROID ) 88 MCG tablet TAKE 1 TABLET EVERY DAY BEFORE BREAKFAST 90 tablet 0   lisinopril  (ZESTRIL ) 20 MG tablet Take 1 tablet (20 mg total) by mouth daily.  90 tablet 3   loratadine  (CLARITIN ) 10 MG tablet Take 1 tablet (10 mg total) by mouth every evening. 90 tablet 4   metoprolol  succinate (TOPROL -XL) 25 MG 24 hr tablet Take 1 tablet (25 mg total) by mouth daily. 90 tablet 4   mirabegron  ER (MYRBETRIQ ) 25 MG TB24 tablet Take 1 tablet (25 mg total) by mouth daily. 90 tablet 3   omeprazole  (PRILOSEC) 40 MG capsule TAKE 1 CAPSULE EVERY DAY 90 capsule 0   polyethylene glycol (MIRALAX ) 17 g packet Take 17 g by mouth daily. 14 each 0   potassium chloride  (KLOR-CON  M) 10 MEQ tablet Take 1 tablet (10 mEq total) by mouth daily. 90 tablet 3   pravastatin  (PRAVACHOL ) 40 MG tablet Take 1 tablet (40 mg total) by mouth at bedtime. 90 tablet 3   Propylene  Glycol (SYSTANE BALANCE OP) Place 1 drop into both eyes 3 (three) times daily as needed (for dryness).     torsemide  (DEMADEX ) 10 MG tablet Take 1 tablet (10 mg total) by mouth every other day. 30 tablet 3   verapamil  (CALAN -SR) 180 MG CR tablet Take 1 tablet (180 mg total) by mouth daily. 90 tablet 1   vitamin B-12 (CYANOCOBALAMIN ) 500 MCG tablet Take 1 tablet (500 mcg total) by mouth once a week.     donepezil  (ARICEPT ) 5 MG tablet Take 1 tablet (5 mg total) by mouth at bedtime. For memory 90 tablet 1   Facility-Administered Medications Prior to Visit  Medication Dose Route Frequency Provider Last Rate Last Admin   denosumab  (PROLIA ) injection 60 mg  60 mg Subcutaneous Once Claire Crick, MD         Per HPI unless specifically indicated in ROS section below Review of Systems  Objective:  BP 128/66   Pulse 77   Temp 98.2 F (36.8 C) (Oral)   Ht 5' 1 (1.549 m)   Wt 202 lb 2 oz (91.7 kg)   SpO2 97%   BMI 38.19 kg/m   Wt Readings from Last 3 Encounters:  06/14/24 202 lb 2 oz (91.7 kg)  05/05/24 203 lb 2 oz (92.1 kg)  04/07/24 193 lb 6 oz (87.7 kg)      Physical Exam    Results for orders placed or performed in visit on 06/14/24  POCT Urinalysis Dipstick (Automated)   Collection Time: 06/14/24  4:05 PM  Result Value Ref Range   Color, UA yellow    Clarity, UA cloudy    Glucose, UA Negative Negative   Bilirubin, UA negative    Ketones, UA negative    Spec Grav, UA 1.015 1.010 - 1.025   Blood, UA negative    pH, UA 6.0 5.0 - 8.0   Protein, UA Negative Negative   Urobilinogen, UA 0.2 0.2 or 1.0 E.U./dL   Nitrite, UA Negative    Leukocytes, UA Large (3+) (A) Negative   Micro: WBC 20+ RBC rare Bact 1+ diplococci Casts none Epi 10+ UCx sent  Assessment & Plan:   Problem List Items Addressed This Visit     CKD (chronic kidney disease) stage 4, GFR 15-29 ml/min (HCC)   Relevant Orders   Amb Referral to Palliative Care   Renal function panel   POCT Urinalysis  Dipstick (Automated) (Completed)   Microalbumin / creatinine urine ratio   Urine Culture   Chronic diastolic CHF (congestive heart failure) (HCC)   Relevant Orders   Amb Referral to Palliative Care   Memory deficit - Primary   Relevant Orders  Amb Referral to Palliative Care   Impaired mobility and personal care   Relevant Orders   Amb Referral to Palliative Care   Chronic low back pain with right-sided sciatica   Relevant Medications   donepezil  (ARICEPT ) 10 MG tablet   Other Relevant Orders   Amb Referral to Palliative Care     Meds ordered this encounter  Medications   donepezil  (ARICEPT ) 10 MG tablet    Sig: Take 1 tablet (10 mg total) by mouth at bedtime. For memory    Dispense:  90 tablet    Refill:  1    Note new dose    Orders Placed This Encounter  Procedures   Urine Culture   Renal function panel   Microalbumin / creatinine urine ratio   Amb Referral to Palliative Care    Referral Priority:   Routine    Referral Type:   Consultation    Number of Visits Requested:   1   POCT Urinalysis Dipstick (Automated)    Patient Instructions  Increase aricept  (donepezil ) to 10mg  nightly - new dose at pharmacy Urine test today to check urine protein levels.  Urinalysis today as well.  Labs today Return in 1-2 months for physical I will ask palliative care team to come out to house for evaluation.   Follow up plan: Return in about 2 months (around 08/14/2024), or if symptoms worsen or fail to improve, for annual exam, prior fasting for blood work.  Claire Crick, MD

## 2024-06-15 DIAGNOSIS — Z515 Encounter for palliative care: Secondary | ICD-10-CM | POA: Diagnosis not present

## 2024-06-15 LAB — MICROALBUMIN / CREATININE URINE RATIO
Creatinine, Urine: 54 mg/dL (ref 20–275)
Microalb Creat Ratio: 117 mg/g{creat} — ABNORMAL HIGH
Microalb, Ur: 6.3 mg/dL

## 2024-06-15 LAB — RENAL FUNCTION PANEL
Albumin: 3.6 g/dL (ref 3.6–5.1)
BUN/Creatinine Ratio: 12 (calc) (ref 6–22)
BUN: 22 mg/dL (ref 7–25)
CO2: 24 mmol/L (ref 20–32)
Calcium: 10.1 mg/dL (ref 8.6–10.4)
Chloride: 108 mmol/L (ref 98–110)
Creat: 1.85 mg/dL — ABNORMAL HIGH (ref 0.60–0.95)
Glucose, Bld: 92 mg/dL (ref 65–99)
Phosphorus: 3 mg/dL (ref 2.1–4.3)
Potassium: 4.7 mmol/L (ref 3.5–5.3)
Sodium: 144 mmol/L (ref 135–146)

## 2024-06-17 ENCOUNTER — Ambulatory Visit: Payer: Self-pay | Admitting: Family Medicine

## 2024-06-17 LAB — URINE CULTURE
MICRO NUMBER:: 16584713
SPECIMEN QUALITY:: ADEQUATE

## 2024-06-17 MED ORDER — AMOXICILLIN 875 MG PO TABS: 875.0000 mg | ORAL_TABLET | Freq: Two times a day (BID) | ORAL | 0 refills | Status: DC

## 2024-06-17 NOTE — Assessment & Plan Note (Signed)
 Chronic, ongoing.  No further stasis dermatitis changes.  Torsemide  10mg  started 02/2024 in place of furosemide .  Verapamil  dose dropped 04/2024.

## 2024-06-17 NOTE — Assessment & Plan Note (Addendum)
 With increased falls recently. Palliative care referral placed.

## 2024-06-17 NOTE — Assessment & Plan Note (Addendum)
 After several days of missed medications 2 wks ago. Now back on regular medications.  Abnormal UA - send culture.

## 2024-06-17 NOTE — Assessment & Plan Note (Addendum)
 Previously discussed and scanned into chart 06/2022. HCPOA - daughter Fairy [place] then [employer] [facility]. Would want full code, ok with feeding tube as long as not brain dead. Does not want prolonged life support if terminal condition. Wants living will followed.

## 2024-06-17 NOTE — Assessment & Plan Note (Addendum)
 Pending updated echocardiogram at end of month.  Recent BNP mildly elevated.  Anticipate CHF contributing to pedal edema - update renal function then consider temporary increased torsemide  dose.  Torsemide  started 02/2024 due to poor furosemide  40mg  effect in setting of progressive kidney disease.  Will also recommend holding verapamil  until echo/cards f/u.

## 2024-06-17 NOTE — Assessment & Plan Note (Addendum)
 Acute on chronic low back pain with bilateral radiculopathy.  Lumbar MRI 08/2023 showing chronic T12 compression fracture and subacute L2 compression fracture, severe spinal and foraminal stenosis affecting conus medullaris at L1/2 and 2/3.  Saw Dr Sharl Davies PMR 11/2023 s/p R L3/4 lumbar TF ESI x2

## 2024-06-17 NOTE — Assessment & Plan Note (Addendum)
 Chronic, improved control on current regimen based on home BP log reviewed.  Will recommend hold verapamil  until cardiology f/u - due to concern over possible worsening CHF, pending updated echocardiogram.

## 2024-06-17 NOTE — Assessment & Plan Note (Signed)
 Update renal panel on torsemide  10mg  every other day.  Previous daily torsemide  may have contributed to AKI.

## 2024-06-17 NOTE — Assessment & Plan Note (Addendum)
 Daughter notes worsening memory in known MCI, after missing meds for several days 2 wks ago.  Increase aricept  to 10mg  nightly.  Check UA - anticipate contaminant, UCx pending.  Regardless, suspect higher level of care need present at this time than can be achieved at ILF - spoke with daughter about starting ALF search.  Will also refer to outpatient palliative care for evaluation

## 2024-06-18 ENCOUNTER — Telehealth: Payer: Self-pay | Admitting: Family Medicine

## 2024-06-18 NOTE — Telephone Encounter (Signed)
 Noted

## 2024-06-18 NOTE — Telephone Encounter (Signed)
 Spoke with pt daughter and she stated that she will go by the nursing home and get the [facility] form and drop off here for provider to fill out       Copied from CRM [id number]. Topic: General - Other >> Jun 18, 2024  9:14 AM [place] E wrote: Reason for CRM: Patient's daughter, [name], called in stating that she will need patient's PCP to fill out an FL2 form in order to get her mother into a nursing home. Callback number for [name] is [phone].

## 2024-06-21 ENCOUNTER — Emergency Department (HOSPITAL_COMMUNITY)

## 2024-06-21 ENCOUNTER — Inpatient Hospital Stay (HOSPITAL_COMMUNITY)
Admission: EM | Admit: 2024-06-21 | Discharge: 2024-06-25 | DRG: 689 | Disposition: A | Discharge status: Skilled Nursing Facility | Source: Skilled Nursing Facility | Attending: Internal Medicine | Admitting: Internal Medicine

## 2024-06-21 DIAGNOSIS — I872 Venous insufficiency (chronic) (peripheral): Secondary | ICD-10-CM | POA: Diagnosis present

## 2024-06-21 DIAGNOSIS — Z79890 Hormone replacement therapy (postmenopausal): Secondary | ICD-10-CM | POA: Diagnosis not present

## 2024-06-21 DIAGNOSIS — Z87891 Personal history of nicotine dependence: Secondary | ICD-10-CM | POA: Diagnosis not present

## 2024-06-21 DIAGNOSIS — Z8616 Personal history of COVID-19: Secondary | ICD-10-CM | POA: Diagnosis not present

## 2024-06-21 DIAGNOSIS — E039 Hypothyroidism, unspecified: Secondary | ICD-10-CM | POA: Diagnosis present

## 2024-06-21 DIAGNOSIS — Z7401 Bed confinement status: Secondary | ICD-10-CM | POA: Diagnosis not present

## 2024-06-21 DIAGNOSIS — G9340 Encephalopathy, unspecified: Secondary | ICD-10-CM | POA: Diagnosis not present

## 2024-06-21 DIAGNOSIS — F0392 Unspecified dementia, unspecified severity, with psychotic disturbance: Secondary | ICD-10-CM | POA: Diagnosis present

## 2024-06-21 DIAGNOSIS — Z833 Family history of diabetes mellitus: Secondary | ICD-10-CM | POA: Diagnosis not present

## 2024-06-21 DIAGNOSIS — Z825 Family history of asthma and other chronic lower respiratory diseases: Secondary | ICD-10-CM | POA: Diagnosis not present

## 2024-06-21 DIAGNOSIS — R443 Hallucinations, unspecified: Secondary | ICD-10-CM | POA: Diagnosis not present

## 2024-06-21 DIAGNOSIS — I959 Hypotension, unspecified: Secondary | ICD-10-CM | POA: Diagnosis not present

## 2024-06-21 DIAGNOSIS — R197 Diarrhea, unspecified: Secondary | ICD-10-CM | POA: Diagnosis present

## 2024-06-21 DIAGNOSIS — I130 Hypertensive heart and chronic kidney disease with heart failure and stage 1 through stage 4 chronic kidney disease, or unspecified chronic kidney disease: Secondary | ICD-10-CM | POA: Diagnosis present

## 2024-06-21 DIAGNOSIS — Z808 Family history of malignant neoplasm of other organs or systems: Secondary | ICD-10-CM | POA: Diagnosis not present

## 2024-06-21 DIAGNOSIS — Z66 Do not resuscitate: Secondary | ICD-10-CM | POA: Diagnosis present

## 2024-06-21 DIAGNOSIS — G8929 Other chronic pain: Secondary | ICD-10-CM | POA: Diagnosis present

## 2024-06-21 DIAGNOSIS — G9341 Metabolic encephalopathy: Secondary | ICD-10-CM | POA: Diagnosis present

## 2024-06-21 DIAGNOSIS — Z79899 Other long term (current) drug therapy: Secondary | ICD-10-CM

## 2024-06-21 DIAGNOSIS — F32A Depression, unspecified: Secondary | ICD-10-CM | POA: Diagnosis present

## 2024-06-21 DIAGNOSIS — N390 Urinary tract infection, site not specified: Principal | ICD-10-CM | POA: Diagnosis present

## 2024-06-21 DIAGNOSIS — I5032 Chronic diastolic (congestive) heart failure: Secondary | ICD-10-CM | POA: Diagnosis present

## 2024-06-21 DIAGNOSIS — E785 Hyperlipidemia, unspecified: Secondary | ICD-10-CM | POA: Diagnosis present

## 2024-06-21 DIAGNOSIS — Z800 Family history of malignant neoplasm of digestive organs: Secondary | ICD-10-CM

## 2024-06-21 DIAGNOSIS — N184 Chronic kidney disease, stage 4 (severe): Secondary | ICD-10-CM | POA: Diagnosis not present

## 2024-06-21 DIAGNOSIS — Z801 Family history of malignant neoplasm of trachea, bronchus and lung: Secondary | ICD-10-CM | POA: Diagnosis not present

## 2024-06-21 DIAGNOSIS — Z8249 Family history of ischemic heart disease and other diseases of the circulatory system: Secondary | ICD-10-CM | POA: Diagnosis not present

## 2024-06-21 DIAGNOSIS — R410 Disorientation, unspecified: Principal | ICD-10-CM

## 2024-06-21 DIAGNOSIS — R600 Localized edema: Secondary | ICD-10-CM | POA: Diagnosis not present

## 2024-06-21 DIAGNOSIS — E876 Hypokalemia: Secondary | ICD-10-CM | POA: Diagnosis present

## 2024-06-21 DIAGNOSIS — I517 Cardiomegaly: Secondary | ICD-10-CM | POA: Diagnosis not present

## 2024-06-21 DIAGNOSIS — F0390 Unspecified dementia without behavioral disturbance: Secondary | ICD-10-CM | POA: Diagnosis not present

## 2024-06-21 DIAGNOSIS — I6782 Cerebral ischemia: Secondary | ICD-10-CM | POA: Diagnosis not present

## 2024-06-21 DIAGNOSIS — I10 Essential (primary) hypertension: Secondary | ICD-10-CM | POA: Diagnosis not present

## 2024-06-21 DIAGNOSIS — R4182 Altered mental status, unspecified: Secondary | ICD-10-CM | POA: Diagnosis not present

## 2024-06-21 LAB — CBC
HCT: 41.4 % (ref 36.0–46.0)
Hemoglobin: 12.4 g/dL (ref 12.0–15.0)
MCH: 29.1 pg (ref 26.0–34.0)
MCHC: 30 g/dL (ref 30.0–36.0)
MCV: 97.2 fL (ref 80.0–100.0)
Platelets: 224 10*3/uL (ref 150–400)
RBC: 4.26 MIL/uL (ref 3.87–5.11)
RDW: 14.2 % (ref 11.5–15.5)
WBC: 7.4 10*3/uL (ref 4.0–10.5)
nRBC: 0 % (ref 0.0–0.2)

## 2024-06-21 LAB — COMPREHENSIVE METABOLIC PANEL WITH GFR
ALT: 15 U/L (ref 0–44)
AST: 23 U/L (ref 15–41)
Albumin: 3.4 g/dL — ABNORMAL LOW (ref 3.5–5.0)
Alkaline Phosphatase: 82 U/L (ref 38–126)
Anion gap: 11 (ref 5–15)
BUN: 24 mg/dL — ABNORMAL HIGH (ref 8–23)
CO2: 25 mmol/L (ref 22–32)
Calcium: 10.1 mg/dL (ref 8.9–10.3)
Chloride: 102 mmol/L (ref 98–111)
Creatinine, Ser: 1.88 mg/dL — ABNORMAL HIGH (ref 0.44–1.00)
GFR, Estimated: 25 mL/min — ABNORMAL LOW (ref >60)
Glucose, Bld: 79 mg/dL (ref 70–99)
Potassium: 4 mmol/L (ref 3.5–5.1)
Sodium: 138 mmol/L (ref 135–145)
Total Bilirubin: 0.9 mg/dL (ref 0.0–1.2)
Total Protein: 6.2 g/dL — ABNORMAL LOW (ref 6.5–8.1)

## 2024-06-21 LAB — URINALYSIS, ROUTINE W REFLEX MICROSCOPIC
Bilirubin Urine: NEGATIVE
Glucose, UA: NEGATIVE mg/dL
Ketones, ur: NEGATIVE mg/dL
Nitrite: NEGATIVE
Protein, ur: NEGATIVE mg/dL
Specific Gravity, Urine: 1.009 (ref 1.005–1.030)
pH: 6 (ref 5.0–8.0)

## 2024-06-21 LAB — BRAIN NATRIURETIC PEPTIDE: B Natriuretic Peptide: 430.7 pg/mL — ABNORMAL HIGH (ref 0.0–100.0)

## 2024-06-21 LAB — CBG MONITORING, ED: Glucose-Capillary: 79 mg/dL (ref 70–99)

## 2024-06-21 MED ORDER — SODIUM CHLORIDE 0.9 % IV SOLN
1.0000 g | Freq: Once | INTRAVENOUS | Status: AC
  Administered 2024-06-22: 1 g via INTRAVENOUS
  Filled 2024-06-21: qty 10

## 2024-06-21 MED ORDER — FUROSEMIDE 10 MG/ML IJ SOLN
60.0000 mg | Freq: Once | INTRAMUSCULAR | Status: AC
  Administered 2024-06-21: 60 mg via INTRAVENOUS
  Filled 2024-06-21: qty 8

## 2024-06-21 MED ORDER — OLANZAPINE 5 MG PO TABS
5.0000 mg | ORAL_TABLET | Freq: Once | ORAL | Status: AC
  Administered 2024-06-21: 5 mg via ORAL
  Filled 2024-06-21: qty 1

## 2024-06-21 NOTE — ED Provider Notes (Signed)
 [place] EMERGENCY DEPARTMENT AT [facility] Provider Note   CSN: [id number] Arrival date & time: 06/21/24  1428     Patient presents with: Altered Mental Status   [name] is a 89 y.o. female.  {Add pertinent medical, surgical, social history, OB history to HPI:8812} 89 year old female presenting emergency department for altered mental status, hallucinations and generalized weakness.  Daughter at bedside provided history as patient with baseline dementia and has been with worsening waxing and waning confusion.  Has been hallucinating the past couple days seeing people that have passed away or objects in the room that were not there.  Daughter notes that mother has been treated with amoxicillin  or Augmentin, she is not sure which for roughly the past week for urinary tract infection primary doctor.  Notes some redness to the bilateral lower extremities.  No fevers, chills, cough, nausea vomiting diarrhea.  Moving bowels regularly.   Altered Mental Status      Prior to Admission medications   Medication Sig Start Date End Date Taking? Authorizing Provider  acetaminophen  (TYLENOL ) 500 MG tablet Take 1,000 mg by mouth See admin instructions. Take 1,000 mg by mouth in the morning and an additional 1,000 mg once a day as needed for pain    [provider]  amoxicillin  (AMOXIL ) 875 MG tablet Take 1 tablet (875 mg total) by mouth 2 (two) times daily for 7 days. 06/17/24 06/24/24  Rilla Baller, MD  ascorbic acid  (VITAMIN C ) 500 MG tablet Take 1 tablet (500 mg total) by mouth every Monday, Wednesday, and Friday. With iron  02/09/24   Rilla Baller, MD  calcium  gluconate 500 MG tablet Take 1 tablet (500 mg total) by mouth daily. 04/14/21   Rilla Baller, MD  cholecalciferol  (VITAMIN D ) 1000 UNITS tablet Take 1,000 Units by mouth daily.    [provider]  denosumab  (PROLIA ) 60 MG/ML SOSY injection Inject 60 mg into the skin every 6 (six) months.  Patient scheduled for 06/11/23 05/30/23   Rilla Baller, MD  docusate sodium  (COLACE) 100 MG capsule Take 1 capsule (100 mg total) by mouth daily as needed for mild constipation or moderate constipation. 02/09/24   Rilla Baller, MD  donepezil  (ARICEPT ) 10 MG tablet Take 1 tablet (10 mg total) by mouth at bedtime. For memory 06/14/24   Rilla Baller, MD  famotidine  (PEPCID ) 20 MG tablet Take 20 mg by mouth daily.    [provider]  FLUoxetine  (PROZAC ) 40 MG capsule TAKE 1 CAPSULE EVERY DAY 06/09/24   Rilla Baller, MD  hydrALAZINE  (APRESOLINE ) 25 MG tablet TAKE 1 TABLET IN THE MORNING AND AT BEDTIME. 05/19/23   Rilla Baller, MD  Iron , Ferrous Sulfate , 325 (65 Fe) MG TABS Take 325 mg by mouth every Monday, Wednesday, and Friday. 05/19/23   Rilla Baller, MD  levothyroxine  (SYNTHROID ) 88 MCG tablet TAKE 1 TABLET EVERY DAY BEFORE BREAKFAST 06/09/24   Rilla Baller, MD  lisinopril  (ZESTRIL ) 20 MG tablet Take 1 tablet (20 mg total) by mouth daily. 05/25/24   Rilla Baller, MD  loratadine  (CLARITIN ) 10 MG tablet Take 1 tablet (10 mg total) by mouth every evening. 05/19/23   Rilla Baller, MD  metoprolol  succinate (TOPROL -XL) 25 MG 24 hr tablet Take 1 tablet (25 mg total) by mouth daily. 05/19/23   Rilla Baller, MD  mirabegron  ER (MYRBETRIQ ) 25 MG TB24 tablet Take 1 tablet (25 mg total) by mouth daily. 03/12/24   Rilla Baller, MD  omeprazole  (PRILOSEC) 40 MG capsule TAKE 1 CAPSULE EVERY  DAY 06/09/24   Rilla Baller, MD  polyethylene glycol (MIRALAX ) 17 g packet Take 17 g by mouth daily. 03/31/24   Small, Brooke L, PA  potassium chloride  (KLOR-CON  M) 10 MEQ tablet Take 1 tablet (10 mEq total) by mouth daily. 08/07/23   Rilla Baller, MD  pravastatin  (PRAVACHOL ) 40 MG tablet Take 1 tablet (40 mg total) by mouth at bedtime. 05/25/24   Rilla Baller, MD  Propylene Glycol (SYSTANE BALANCE OP) Place 1 drop into both eyes 3 (three) times daily as needed (for  dryness).    [provider]  torsemide  (DEMADEX ) 10 MG tablet Take 1 tablet (10 mg total) by mouth every other day. 05/06/24   Rilla Baller, MD  verapamil  (CALAN -SR) 180 MG CR tablet Take 1 tablet (180 mg total) by mouth daily. 05/05/24   Rilla Baller, MD  vitamin B-12 (CYANOCOBALAMIN ) 500 MCG tablet Take 1 tablet (500 mcg total) by mouth once a week. 03/08/20   Rilla Baller, MD    Allergies: Trazodone  and nefazodone    Review of Systems  Updated Vital Signs BP (!) 151/89   Pulse 83   Temp 98.1 F (36.7 C) (Oral)   Resp (!) 21   SpO2 98%   Physical Exam Vitals and nursing note reviewed.  Constitutional:      Comments: Resting.  HENT:     Head: Normocephalic and atraumatic.     Nose: Nose normal.     Mouth/Throat:     Mouth: Mucous membranes are moist.   Cardiovascular:     Rate and Rhythm: Normal rate and regular rhythm.  Pulmonary:     Effort: Pulmonary effort is normal.     Breath sounds: Normal breath sounds.  Abdominal:     General: Abdomen is flat. There is no distension.     Tenderness: There is no abdominal tenderness. There is no guarding or rebound.   Musculoskeletal:     Right lower leg: Edema present.     Left lower leg: Edema present.   Skin:    General: Skin is warm and dry.     Capillary Refill: Capillary refill takes less than 2 seconds.   Neurological:     Comments: Exam limited by patient's participation.  She would squeeze hands and hold arms up after I lifted them.  Would move feet to stimuli.  Psychiatric:     Comments: Not possible to assess     (all labs ordered are listed, but only abnormal results are displayed) Labs Reviewed  COMPREHENSIVE METABOLIC PANEL WITH GFR  CBC  URINALYSIS, ROUTINE W REFLEX MICROSCOPIC  CBG MONITORING, ED    EKG: None  Radiology: No results found.  {Document cardiac monitor, telemetry assessment procedure when appropriate:32947} Procedures   Medications Ordered in the ED - No  data to display    {Click here for ABCD2, HEART and other calculators REFRESH Note before signing:1}                              Medical Decision Making This is a 89 year old female presenting emergency department for altered mental status.  She is a DNR and has paperwork with her.  Amount and/or Complexity of Data Reviewed Independent Historian:     Details: Daughter provided list of history. External Data Reviewed:     Details: Had a UTI for Proteus susceptible to Augmentin. Labs: ordered. Decision-making details documented in ED Course. Radiology: ordered. Decision-making details documented in ED Course.  ECG/medicine tests:  Decision-making details documented in ED Course.  Risk Decision regarding hospitalization. Diagnosis or treatment significantly limited by social determinants of health.    {Document critical care time when appropriate  Document review of labs and clinical decision tools ie CHADS2VASC2, etc  Document your independent review of radiology images and any outside records  Document your discussion with family members, caretakers and with consultants  Document social determinants of health affecting pt's care  Document your decision making why or why not admission, treatments were needed:32947:::1}   Final diagnoses:  None    ED Discharge Orders     None

## 2024-06-21 NOTE — ED Triage Notes (Addendum)
 Pt bib [facility] for altered mental status including hallucinations. She is being treated for UTI. Hard of hearing - hearing aides need charging.  Oriented to self. Disoriented to time [employer]). Unable to ambulate today  Redness and swelling in legs

## 2024-06-22 ENCOUNTER — Encounter (HOSPITAL_COMMUNITY): Payer: Self-pay | Admitting: Internal Medicine

## 2024-06-22 ENCOUNTER — Inpatient Hospital Stay (HOSPITAL_COMMUNITY)

## 2024-06-22 ENCOUNTER — Telehealth: Payer: Self-pay

## 2024-06-22 ENCOUNTER — Other Ambulatory Visit: Payer: Self-pay

## 2024-06-22 DIAGNOSIS — Z833 Family history of diabetes mellitus: Secondary | ICD-10-CM | POA: Diagnosis not present

## 2024-06-22 DIAGNOSIS — Z800 Family history of malignant neoplasm of digestive organs: Secondary | ICD-10-CM | POA: Diagnosis not present

## 2024-06-22 DIAGNOSIS — Z8616 Personal history of COVID-19: Secondary | ICD-10-CM | POA: Diagnosis not present

## 2024-06-22 DIAGNOSIS — R410 Disorientation, unspecified: Secondary | ICD-10-CM | POA: Diagnosis present

## 2024-06-22 DIAGNOSIS — N184 Chronic kidney disease, stage 4 (severe): Secondary | ICD-10-CM | POA: Diagnosis present

## 2024-06-22 DIAGNOSIS — E039 Hypothyroidism, unspecified: Secondary | ICD-10-CM | POA: Diagnosis present

## 2024-06-22 DIAGNOSIS — F32A Depression, unspecified: Secondary | ICD-10-CM | POA: Diagnosis present

## 2024-06-22 DIAGNOSIS — I130 Hypertensive heart and chronic kidney disease with heart failure and stage 1 through stage 4 chronic kidney disease, or unspecified chronic kidney disease: Secondary | ICD-10-CM | POA: Diagnosis present

## 2024-06-22 DIAGNOSIS — G9340 Encephalopathy, unspecified: Secondary | ICD-10-CM | POA: Diagnosis not present

## 2024-06-22 DIAGNOSIS — G9341 Metabolic encephalopathy: Secondary | ICD-10-CM | POA: Diagnosis present

## 2024-06-22 DIAGNOSIS — Z825 Family history of asthma and other chronic lower respiratory diseases: Secondary | ICD-10-CM | POA: Diagnosis not present

## 2024-06-22 DIAGNOSIS — Z8249 Family history of ischemic heart disease and other diseases of the circulatory system: Secondary | ICD-10-CM | POA: Diagnosis not present

## 2024-06-22 DIAGNOSIS — E876 Hypokalemia: Secondary | ICD-10-CM | POA: Diagnosis present

## 2024-06-22 DIAGNOSIS — Z66 Do not resuscitate: Secondary | ICD-10-CM | POA: Diagnosis present

## 2024-06-22 DIAGNOSIS — Z87891 Personal history of nicotine dependence: Secondary | ICD-10-CM | POA: Diagnosis not present

## 2024-06-22 DIAGNOSIS — R197 Diarrhea, unspecified: Secondary | ICD-10-CM | POA: Diagnosis present

## 2024-06-22 DIAGNOSIS — Z79890 Hormone replacement therapy (postmenopausal): Secondary | ICD-10-CM | POA: Diagnosis not present

## 2024-06-22 DIAGNOSIS — G8929 Other chronic pain: Secondary | ICD-10-CM | POA: Diagnosis present

## 2024-06-22 DIAGNOSIS — E785 Hyperlipidemia, unspecified: Secondary | ICD-10-CM | POA: Diagnosis present

## 2024-06-22 DIAGNOSIS — F0392 Unspecified dementia, unspecified severity, with psychotic disturbance: Secondary | ICD-10-CM | POA: Diagnosis present

## 2024-06-22 DIAGNOSIS — N390 Urinary tract infection, site not specified: Secondary | ICD-10-CM | POA: Diagnosis present

## 2024-06-22 DIAGNOSIS — I5032 Chronic diastolic (congestive) heart failure: Secondary | ICD-10-CM | POA: Diagnosis present

## 2024-06-22 DIAGNOSIS — Z79899 Other long term (current) drug therapy: Secondary | ICD-10-CM | POA: Diagnosis not present

## 2024-06-22 DIAGNOSIS — I872 Venous insufficiency (chronic) (peripheral): Secondary | ICD-10-CM | POA: Diagnosis present

## 2024-06-22 DIAGNOSIS — Z808 Family history of malignant neoplasm of other organs or systems: Secondary | ICD-10-CM | POA: Diagnosis not present

## 2024-06-22 DIAGNOSIS — Z801 Family history of malignant neoplasm of trachea, bronchus and lung: Secondary | ICD-10-CM | POA: Diagnosis not present

## 2024-06-22 LAB — COMPREHENSIVE METABOLIC PANEL WITH GFR
ALT: 14 U/L (ref 0–44)
AST: 26 U/L (ref 15–41)
Albumin: 3.4 g/dL — ABNORMAL LOW (ref 3.5–5.0)
Alkaline Phosphatase: 80 U/L (ref 38–126)
Anion gap: 16 — ABNORMAL HIGH (ref 5–15)
BUN: 24 mg/dL — ABNORMAL HIGH (ref 8–23)
CO2: 22 mmol/L (ref 22–32)
Calcium: 10.1 mg/dL (ref 8.9–10.3)
Chloride: 103 mmol/L (ref 98–111)
Creatinine, Ser: 1.78 mg/dL — ABNORMAL HIGH (ref 0.44–1.00)
GFR, Estimated: 26 mL/min — ABNORMAL LOW (ref >60)
Glucose, Bld: 78 mg/dL (ref 70–99)
Potassium: 3.4 mmol/L — ABNORMAL LOW (ref 3.5–5.1)
Sodium: 141 mmol/L (ref 135–145)
Total Bilirubin: 0.8 mg/dL (ref 0.0–1.2)
Total Protein: 6.3 g/dL — ABNORMAL LOW (ref 6.5–8.1)

## 2024-06-22 LAB — CBC
HCT: 36.7 % (ref 36.0–46.0)
HCT: 42.5 % (ref 36.0–46.0)
Hemoglobin: 10.9 g/dL — ABNORMAL LOW (ref 12.0–15.0)
Hemoglobin: 12.8 g/dL (ref 12.0–15.0)
MCH: 28.9 pg (ref 26.0–34.0)
MCH: 29.3 pg (ref 26.0–34.0)
MCHC: 29.7 g/dL — ABNORMAL LOW (ref 30.0–36.0)
MCHC: 30.1 g/dL (ref 30.0–36.0)
MCV: 97.3 fL (ref 80.0–100.0)
MCV: 97.3 fL (ref 80.0–100.0)
Platelets: 189 10*3/uL (ref 150–400)
Platelets: 194 10*3/uL (ref 150–400)
RBC: 3.77 MIL/uL — ABNORMAL LOW (ref 3.87–5.11)
RBC: 4.37 MIL/uL (ref 3.87–5.11)
RDW: 13.9 % (ref 11.5–15.5)
RDW: 13.9 % (ref 11.5–15.5)
WBC: 8.6 10*3/uL (ref 4.0–10.5)
WBC: 9.7 10*3/uL (ref 4.0–10.5)
nRBC: 0 % (ref 0.0–0.2)
nRBC: 0 % (ref 0.0–0.2)

## 2024-06-22 LAB — C-REACTIVE PROTEIN: CRP: 1 mg/dL — ABNORMAL HIGH (ref <1.0)

## 2024-06-22 LAB — URINALYSIS, W/ REFLEX TO CULTURE (INFECTION SUSPECTED)
Bilirubin Urine: NEGATIVE
Glucose, UA: NEGATIVE mg/dL
Ketones, ur: NEGATIVE mg/dL
Leukocytes,Ua: NEGATIVE
Nitrite: NEGATIVE
Protein, ur: NEGATIVE mg/dL
Specific Gravity, Urine: 1.005 (ref 1.005–1.030)
pH: 5 (ref 5.0–8.0)

## 2024-06-22 LAB — CREATININE, SERUM
Creatinine, Ser: 1.57 mg/dL — ABNORMAL HIGH (ref 0.44–1.00)
GFR, Estimated: 31 mL/min — ABNORMAL LOW (ref >60)

## 2024-06-22 LAB — PROCALCITONIN: Procalcitonin: <0.1 ng/mL

## 2024-06-22 MED ORDER — FLUOXETINE HCL 20 MG PO CAPS
40.0000 mg | ORAL_CAPSULE | Freq: Every day | ORAL | Status: DC
  Administered 2024-06-22 – 2024-06-25 (×4): 40 mg via ORAL
  Filled 2024-06-22 (×4): qty 2

## 2024-06-22 MED ORDER — TORSEMIDE 10 MG PO TABS
10.0000 mg | ORAL_TABLET | Freq: Every day | ORAL | Status: DC
  Administered 2024-06-22 – 2024-06-25 (×4): 10 mg via ORAL
  Filled 2024-06-22 (×4): qty 1

## 2024-06-22 MED ORDER — LINEZOLID 600 MG/300ML IV SOLN
600.0000 mg | Freq: Two times a day (BID) | INTRAVENOUS | Status: DC
  Administered 2024-06-22: 600 mg via INTRAVENOUS
  Filled 2024-06-22: qty 300

## 2024-06-22 MED ORDER — PRAVASTATIN SODIUM 40 MG PO TABS
40.0000 mg | ORAL_TABLET | Freq: Every day | ORAL | Status: DC
  Administered 2024-06-22 – 2024-06-24 (×4): 40 mg via ORAL
  Filled 2024-06-22: qty 2
  Filled 2024-06-22 (×3): qty 1

## 2024-06-22 MED ORDER — HYDRALAZINE HCL 25 MG PO TABS
25.0000 mg | ORAL_TABLET | Freq: Two times a day (BID) | ORAL | Status: DC
  Administered 2024-06-22 – 2024-06-25 (×7): 25 mg via ORAL
  Filled 2024-06-22 (×8): qty 1

## 2024-06-22 MED ORDER — CALCIUM GLUCONATE 50 MG PO CAPS: 500.0000 mg | ORAL_CAPSULE | Freq: Every day | ORAL | Status: DC

## 2024-06-22 MED ORDER — SODIUM CHLORIDE 0.9 % IV SOLN
1.0000 g | INTRAVENOUS | Status: DC
  Administered 2024-06-22 – 2024-06-23 (×2): 1 g via INTRAVENOUS
  Filled 2024-06-22 (×2): qty 10

## 2024-06-22 MED ORDER — LEVOTHYROXINE SODIUM 88 MCG PO TABS
88.0000 ug | ORAL_TABLET | Freq: Every day | ORAL | Status: DC
  Administered 2024-06-22 – 2024-06-25 (×4): 88 ug via ORAL
  Filled 2024-06-22 (×4): qty 1

## 2024-06-22 MED ORDER — CALCIUM CARBONATE 1250 (500 CA) MG PO TABS
1.0000 | ORAL_TABLET | Freq: Every day | ORAL | Status: DC
  Administered 2024-06-22 – 2024-06-25 (×4): 1250 mg via ORAL
  Filled 2024-06-22 (×4): qty 1

## 2024-06-22 MED ORDER — DONEPEZIL HCL 10 MG PO TABS
10.0000 mg | ORAL_TABLET | Freq: Every day | ORAL | Status: DC
  Administered 2024-06-22 – 2024-06-24 (×4): 10 mg via ORAL
  Filled 2024-06-22 (×2): qty 1
  Filled 2024-06-22: qty 2
  Filled 2024-06-22: qty 1

## 2024-06-22 MED ORDER — ACETAMINOPHEN 500 MG PO TABS
1000.0000 mg | ORAL_TABLET | Freq: Every day | ORAL | Status: DC
  Administered 2024-06-22 – 2024-06-25 (×4): 1000 mg via ORAL
  Filled 2024-06-22 (×4): qty 2

## 2024-06-22 MED ORDER — ACETAMINOPHEN 650 MG RE SUPP: 650.0000 mg | Freq: Four times a day (QID) | RECTAL | Status: DC | PRN

## 2024-06-22 MED ORDER — FERROUS SULFATE 325 (65 FE) MG PO TABS
325.0000 mg | ORAL_TABLET | ORAL | Status: DC
  Administered 2024-06-23 – 2024-06-25 (×2): 325 mg via ORAL
  Filled 2024-06-22 (×3): qty 1

## 2024-06-22 MED ORDER — METOPROLOL SUCCINATE ER 25 MG PO TB24
25.0000 mg | ORAL_TABLET | Freq: Every day | ORAL | Status: DC
  Administered 2024-06-22 – 2024-06-25 (×3): 25 mg via ORAL
  Filled 2024-06-22 (×3): qty 1

## 2024-06-22 MED ORDER — ALBUTEROL SULFATE (2.5 MG/3ML) 0.083% IN NEBU: 2.5000 mg | INHALATION_SOLUTION | RESPIRATORY_TRACT | Status: DC | PRN

## 2024-06-22 MED ORDER — ONDANSETRON HCL 4 MG/2ML IJ SOLN: 4.0000 mg | Freq: Four times a day (QID) | INTRAMUSCULAR | Status: DC | PRN

## 2024-06-22 MED ORDER — ACETAMINOPHEN 325 MG PO TABS
650.0000 mg | ORAL_TABLET | Freq: Four times a day (QID) | ORAL | Status: DC | PRN
Start: 2024-06-22 — End: 2024-06-25

## 2024-06-22 MED ORDER — PANTOPRAZOLE SODIUM 40 MG PO TBEC
80.0000 mg | DELAYED_RELEASE_TABLET | Freq: Every day | ORAL | Status: DC
  Administered 2024-06-22 – 2024-06-25 (×4): 80 mg via ORAL
  Filled 2024-06-22 (×4): qty 2

## 2024-06-22 MED ORDER — HEPARIN SODIUM (PORCINE) 5000 UNIT/ML IJ SOLN
5000.0000 [IU] | Freq: Three times a day (TID) | INTRAMUSCULAR | Status: DC
  Administered 2024-06-22 – 2024-06-25 (×10): 5000 [IU] via SUBCUTANEOUS
  Filled 2024-06-22 (×10): qty 1

## 2024-06-22 MED ORDER — ONDANSETRON HCL 4 MG PO TABS: 4.0000 mg | ORAL_TABLET | Freq: Four times a day (QID) | ORAL | Status: DC | PRN

## 2024-06-22 NOTE — Progress Notes (Addendum)
 Patient's daughter at bedside, she brought in Consulting civil engineer for patient's hearing aides and is using it now. Administration of morning meds delayed, patient very sleepy this morning, discussed with family. Will give PO meds when more awake.

## 2024-06-22 NOTE — TOC Initial Note (Signed)
 Transition of Care [facility]) - Initial/Assessment Note   Patient Details  Name: [name] MRN: [id number] Date of Birth: August 20, 1930  [facility]) CM/SW Contact:    Duwaine GORMAN Aran, LCSW Phone Number: 06/22/2024, 11:17 AM  Clinical Narrative: Patient resides in independent living at [facility]. CSW received call from patient's granddaughter, Eleanor Sensing, asking about rehab at [facility] CSW explained PT has not yet been consulted, but family can follow up with the hospitalist about an assessment. TOC to follow.  Expected Discharge Plan:  (TBD) Barriers to Discharge: Continued Medical Work up  Patient Goals and CMS Choice Patient states their goals for this hospitalization and ongoing recovery are:: Oriented to self only  Expected Discharge Plan and Services In-house Referral: Clinical Social Work Living arrangements for the past 2 months: Apartment, Independent Living Facility          DME Arranged: N/A DME Agency: NA  Prior Living Arrangements/Services Living arrangements for the past 2 months: Apartment, Independent Living Facility Lives with:: Self Patient language and need for interpreter reviewed:: Yes Do you feel safe going back to the place where you live?: Yes      Need for Family Participation in Patient Care: Yes (Comment) (Patient oriented to self only.) Care giver support system in place?: Yes (comment) Criminal Activity/Legal Involvement Pertinent to Current Situation/Hospitalization: No - Comment as needed  Activities of Daily Living ADL Screening (condition at time of admission) Independently performs ADLs?: No Does the patient have a NEW difficulty with bathing/dressing/toileting/self-feeding that is expected to last >3 days?: No Does the patient have a NEW difficulty with getting in/out of bed, walking, or climbing stairs that is expected to last >3 days?: No Does the patient have a NEW difficulty with communication that is expected  to last >3 days?: No Is the patient deaf or have difficulty hearing?: Yes Does the patient have difficulty seeing, even when wearing glasses/contacts?: No Does the patient have difficulty concentrating, remembering, or making decisions?: Yes  Emotional Assessment Orientation: : Oriented to Self Alcohol / Substance Use: Not Applicable Psych Involvement: No (comment)  Admission diagnosis:  Delirium [R41.0] UTI (urinary tract infection) [N39.0] Patient Active Problem List   Diagnosis Date Noted   UTI (urinary tract infection) 06/22/2024   Chronic right shoulder pain 05/06/2024   Adnexal cyst 04/10/2024   Rectal inflammation 04/10/2024   Stasis dermatitis of both legs 03/05/2024   Episode of confusion 01/30/2024   Spinal stenosis of lumbar region with radiculopathy 10/23/2023   Chronic low back pain with right-sided sciatica 10/06/2023   Compression fracture of L2 lumbar vertebra, closed, initial encounter (HCC) 08/29/2023   Lumbar facet arthropathy 08/29/2023   Anterolisthesis of lumbar spine 08/29/2023   Right leg pain 08/06/2023   Impaired mobility and personal care 08/06/2023   Polypharmacy 03/17/2023   GERD (gastroesophageal reflux disease) 09/18/2022   Pain due to onychomycosis of toenails of both feet 08/28/2022   Thickened nails 05/14/2022   MCI (mild cognitive impairment) with memory loss 05/14/2022   Bilateral lower extremity edema 03/13/2022   Allergic rhinitis 12/15/2021   Corneal epithelial basement membrane dystrophy 04/14/2021   Secondary hyperparathyroidism of renal origin (HCC) 10/12/2020   Constipation 09/21/2020   Angiodysplasia of colon with hemorrhage    Benign neoplasm of ascending colon    Benign neoplasm of descending colon    Iron  deficiency anemia    Chronic diastolic CHF (congestive heart failure) (HCC) 03/12/2020   Urge incontinence 09/01/2019   Systolic murmur  09/01/2019   Atypical nevi 11/30/2018   Family history of lung cancer 11/30/2018    Pseudogout of left wrist 04/04/2018   Thoracic back pain 09/23/2016   Osteoporosis 03/30/2016   Advanced care planning/counseling discussion 03/13/2015   Health maintenance examination 03/13/2015   CKD (chronic kidney disease) stage 4, GFR 15-29 ml/min (HCC) 03/10/2014   Medicare annual wellness visit, subsequent 12/17/2012   CTS (carpal tunnel syndrome) 10/27/2012   Exertional dyspnea 10/27/2012   Osteoarthritis    MDD (major depressive disorder), recurrent episode, moderate (HCC)    History of ulcer disease    Anemia    Hypothyroidism 03/11/2012   Hyperlipidemia 03/11/2012   Hypertension 03/11/2012   PCP:  Rilla Baller, MD Pharmacy:   [facility] [phone] - [place], [place] - [address] AT [facility] OF [address] & [facility] [address] [place] [address] Phone: [phone] Fax: [phone]  [facility] - [address] [place] - [address] [place] [address] Phone: [phone] Fax: [phone]  Social Drivers of Health (SDOH) Social History: SDOH Screenings   Food Insecurity: Patient Unable To Answer (06/22/2024)  Housing: Unknown (06/22/2024)  Transportation Needs: Patient Unable To Answer (06/22/2024)  Utilities: Patient Unable To Answer (06/22/2024)  Depression (PHQ2-9): High Risk (06/14/2024)  Financial Resource Strain: Low Risk  (04/19/2022)  Physical Activity: Insufficiently Active (04/19/2022)  Social Connections: Patient Unable To Answer (06/22/2024)  Stress: No Stress Concern Present (04/19/2022)  Tobacco Use: Medium Risk (06/22/2024)   SDOH Interventions:    Readmission Risk Interventions     No data to display

## 2024-06-22 NOTE — Plan of Care (Signed)

## 2024-06-22 NOTE — Telephone Encounter (Signed)
 Copied from CRM [id number]. Topic: Clinical - Medical Advice >> Jun 22, 2024  9:09 AM Franky GRADE wrote: Reason for CRM: Patient's daughter is calling to advise that patient is currently admitted in the hospital and she would like to speak with Dr.Gutierrez nurse regarding an Echo scheduled for Monday 06/28/2024.

## 2024-06-22 NOTE — Telephone Encounter (Signed)
 Lvm returning pt's daughter's, Beverley (on dpr), call.

## 2024-06-22 NOTE — H&P (Addendum)
 History and Physical    [name] FMW:[id number] DOB: 1930/01/29 DOA: 06/21/2024  PCP: Rilla Baller, MD  Patient coming from: home  I have personally briefly reviewed patient's old medical records in [facility]  Chief Complaint: change in mental status  HPI: [name] is a 89 y.o. female with medical history significant of  Hx of upper GI bleed, Dementia,HLD, HTN, MCI,CKDIV,hypothyroidism, CHFpef,  chronic lower back pain, Chronic b/l lower extremity edema,recent diagnosis of UTI one week ago for which she was treated with oral antibiotics. Of note urine cultured on 6/16 noted Proteus which was pan-sensitive other than noted resistance to ciprofloxacin and macrobid. Per family patient has note been her self has been more confused over the last week and over the last few days has started to hallucinate.  Due to persistence of her symptoms patient was BIBEMS  to ED for further evaluation. Patient complain of pain in leg when touched , note able to assist with further history.  ED Course:  Vitals:afeb, bp 151/89, hr 83,  rr 21, sat 98%  Bnp 430.7 NSR, pac,LAFB unchanged from prior  Labs: 7.4, hgb 12.4, plt 224 Na 138, K 4, CL 102, glu 79, cr 1.88 ( base 232-1.88) CTH: IMPRESSION: 1. No evidence of acute intracranial abnormality. 2. Mild chronic small vessel ischemic disease. UA: trace LE  wbc 6-19 ,bacteria many ,cloudy tx Lasix  60 mg IV, olanzapine, CTX   Review of Systems: As per HPI otherwise 10 point review of systems negative.   Past Medical History:  Diagnosis Date   Acute upper GI bleeding 03/12/2020   Arthritis    COVID-19 virus infection 12/14/2021   Depression    History of anemia    History of chicken pox    History of ulcer disease 1990s   HLD (hyperlipidemia)    HTN (hypertension)    Hypothyroidism    Osteoporosis 03/2016   at [facility] - T -3.1 hip, -1.0 spine   Uses hearing aid     Past Surgical History:  Procedure Laterality Date    APPENDECTOMY  1974   BACK SURGERY  1979   BIOPSY  03/13/2020   Procedure: BIOPSY;  Surgeon: Aneita Gwendlyn DASEN, MD;  Location: [facility];  Service: Endoscopy;;   CARDIOVASCULAR STRESS TEST  2010   WNL   CARPAL TUNNEL RELEASE Left 04/28/2017   CATARACT EXTRACTION Bilateral 2012   COLONOSCOPY WITH PROPOFOL  N/A 03/14/2020   single bleeding colonic angiectasia treated with APC Payton, Gordy HERO, MD)   DEXA  03/2016   at [facility] - T -3.1 hip, -1.0 spine   ESOPHAGOGASTRODUODENOSCOPY (EGD) WITH PROPOFOL  N/A 03/13/2020   erosive gastropathy with stigmata of recent bleeding Oma Gwendlyn DASEN, MD)   GALLBLADDER SURGERY  1974   HOT HEMOSTASIS N/A 03/14/2020   Procedure: HOT HEMOSTASIS (ARGON PLASMA COAGULATION/BICAP);  Surgeon: Albertus Gordy HERO, MD;  Location[facility] END[facility]COPY;  Service: Gastroenterology;  Laterality: N/A;   KNEE SURGERY  1987   right   KNEE SURGERY  1998   left   KNEE SURGERY  2008   POLYPECTOMY  03/14/2020   Procedure: POLYPECTOMY;  Surgeon: Albertus Gordy HERO, MD;  Location: [facility] ENDOSCOPY;  Service: Gastroenterology;;   TONSILLECTOMY  1945   US  ECHOCARDIOGRAPHY  2010   LVEF 65-75%, overall normal, mildly decreased LV diastolic compliance     reports that she quit smoking about 31 years ago. Her smoking use included cigarettes. She has never used smokeless tobacco. She reports that she does not  drink alcohol and does not use drugs.  Allergies  Allergen Reactions   Trazodone  And Nefazodone Other (See Comments)    Worked against the patient    Family History  Problem Relation Age of Onset   Cancer Mother 50       lung, nonsmoker   Cancer Father        lung, nonsmoker   Cancer Sister 12       brain   Cancer Sister 54       lung, smoker   COPD Sister        smoker   Cancer Sister 52       colon   Cancer Brother 32       lung, smoker   Hypertension Daughter    Diabetes Daughter    CAD Neg Hx    Stroke Neg Hx     Prior to Admission medications   Medication Sig Start Date End  Date Taking? Authorizing Provider  acetaminophen  (TYLENOL ) 500 MG tablet Take 1,000 mg by mouth See admin instructions. Take 1,000 mg by mouth in the morning and an additional 1,000 mg once a day as needed for pain   Yes [provider]  amoxicillin  (AMOXIL ) 875 MG tablet Take 1 tablet (875 mg total) by mouth 2 (two) times daily for 7 days. 06/17/24 06/24/24 Yes Rilla Baller, MD  ascorbic acid  (VITAMIN C ) 500 MG tablet Take 1 tablet (500 mg total) by mouth every Monday, Wednesday, and Friday. With iron  Patient taking differently: Take 500 mg by mouth See admin instructions. Take 500 mg by mouth every Mon/Wed/Fri with each dose of iron  02/09/24  Yes Rilla Baller, MD  calcium  gluconate 500 MG tablet Take 1 tablet (500 mg total) by mouth daily. 04/14/21  Yes Rilla Baller, MD  cholecalciferol  (VITAMIN D ) 1000 UNITS tablet Take 1,000 Units by mouth daily.   Yes [provider]  denosumab  (PROLIA ) 60 MG/ML SOSY injection Inject 60 mg into the skin every 6 (six) months. Patient scheduled for 06/11/23 05/30/23  Yes Rilla Baller, MD  donepezil  (ARICEPT ) 10 MG tablet Take 1 tablet (10 mg total) by mouth at bedtime. For memory 06/14/24  Yes Rilla Baller, MD  famotidine  (PEPCID ) 20 MG tablet Take 20 mg by mouth daily.   Yes [provider]  FLUoxetine  (PROZAC ) 40 MG capsule TAKE 1 CAPSULE EVERY DAY 06/09/24  Yes Rilla Baller, MD  hydrALAZINE  (APRESOLINE ) 25 MG tablet TAKE 1 TABLET IN THE MORNING AND AT BEDTIME. Patient taking differently: Take 25 mg by mouth in the morning and at bedtime. 05/19/23  Yes Rilla Baller, MD  Iron , Ferrous Sulfate , 325 (65 Fe) MG TABS Take 325 mg by mouth every Monday, Wednesday, and Friday. 05/19/23  Yes Rilla Baller, MD  levothyroxine  (SYNTHROID ) 88 MCG tablet TAKE 1 TABLET EVERY DAY BEFORE BREAKFAST 06/09/24  Yes Rilla Baller, MD  lisinopril  (ZESTRIL ) 20 MG tablet Take 1 tablet (20 mg total) by mouth daily. 05/25/24  Yes  Rilla Baller, MD  metoprolol  succinate (TOPROL -XL) 25 MG 24 hr tablet Take 1 tablet (25 mg total) by mouth daily. 05/19/23  Yes Rilla Baller, MD  mirabegron  ER (MYRBETRIQ ) 25 MG TB24 tablet Take 1 tablet (25 mg total) by mouth daily. Patient taking differently: Take 25 mg by mouth at bedtime. 03/12/24  Yes Rilla Baller, MD  omeprazole  (PRILOSEC) 40 MG capsule TAKE 1 CAPSULE EVERY DAY Patient taking differently: Take 40 mg by mouth daily before breakfast. 06/09/24  Yes Rilla Baller, MD  potassium  chloride (KLOR-CON  M) 10 MEQ tablet Take 1 tablet (10 mEq total) by mouth daily. 08/07/23  Yes Rilla Baller, MD  pravastatin  (PRAVACHOL ) 40 MG tablet Take 1 tablet (40 mg total) by mouth at bedtime. 05/25/24  Yes Rilla Baller, MD  Propylene Glycol (SYSTANE BALANCE OP) Place 1 drop into both eyes 3 (three) times daily as needed (for dryness).   Yes [provider]  torsemide  (DEMADEX ) 10 MG tablet Take 1 tablet (10 mg total) by mouth every other day. Patient taking differently: Take 10 mg by mouth in the morning. 05/06/24  Yes Rilla Baller, MD  vitamin B-12 (CYANOCOBALAMIN ) 500 MCG tablet Take 1 tablet (500 mcg total) by mouth once a week. Patient taking differently: Take 250 mcg by mouth every Thursday. 03/08/20  Yes Rilla Baller, MD  docusate sodium  (COLACE) 100 MG capsule Take 1 capsule (100 mg total) by mouth daily as needed for mild constipation or moderate constipation. Patient not taking: Reported on 06/21/2024 02/09/24   Rilla Baller, MD  loratadine  (CLARITIN ) 10 MG tablet Take 1 tablet (10 mg total) by mouth every evening. Patient not taking: Reported on 06/21/2024 05/19/23   Rilla Baller, MD  polyethylene glycol (MIRALAX ) 17 g packet Take 17 g by mouth daily. Patient not taking: Reported on 06/21/2024 03/31/24   Small, Brooke L, PA  verapamil  (CALAN -SR) 180 MG CR tablet Take 1 tablet (180 mg total) by mouth daily. Patient not taking: Reported on  06/21/2024 05/05/24   Rilla Baller, MD    Physical Exam: Vitals:   06/21/24 1800 06/21/24 1830 06/21/24 2110 06/21/24 2300  BP: (!) 164/101 (!) 168/110 (!) 174/90 (!) 152/78  Pulse: 77 73 (!) 112 (!) 116  Resp: 18 16 16 20   Temp:  (!) 97.5 F (36.4 C) 97.7 F (36.5 C)   TempSrc:  Oral    SpO2: 95% 96% 97% 98%    Constitutional: NAD, calm, comfortable Vitals:   06/21/24 1800 06/21/24 1830 06/21/24 2110 06/21/24 2300  BP: (!) 164/101 (!) 168/110 (!) 174/90 (!) 152/78  Pulse: 77 73 (!) 112 (!) 116  Resp: 18 16 16 20   Temp:  (!) 97.5 F (36.4 C) 97.7 F (36.5 C)   TempSrc:  Oral    SpO2: 95% 96% 97% 98%   Eyes: PERRL, lids and conjunctivae normal ENMT: Mucous membranes are moist. Posterior pharynx clear of any exudate or lesions.Normal dentition.  Neck: normal, supple, no masses, no thyromegaly Respiratory: clear to auscultation bilaterally, no wheezing, no crackles. Normal respiratory effort. No accessory muscle use.  Cardiovascular: Regular rate and rhythm, no murmurs / rubs / gallops. No +edema.  Abdomen: no tenderness, no masses palpated. No hepatosplenomegaly. Bowel sounds positive.  Musculoskeletal: no clubbing / cyanosis. No joint deformity upper and lower extremities. Good ROM, no contractures. Normal muscle tone.  Skin: no rashes, lesions, ulcers. No induration b/l redness/warm and tender  Neurologic: CN grossly intact. Sensation intact, DTR normal. Strength 5/5 in all 4.  Psychiatric: . Alert to self . Normal mood.    Labs on Admission: I have personally reviewed following labs and imaging studies  CBC: Recent Labs  Lab 06/21/24 1519  WBC 7.4  HGB 12.4  HCT 41.4  MCV 97.2  PLT 224   Basic Metabolic Panel: Recent Labs  Lab 06/21/24 1519  NA 138  K 4.0  CL 102  CO2 25  GLUCOSE 79  BUN 24*  CREATININE 1.88*  CALCIUM  10.1   GFR: Estimated Creatinine Clearance: 19.7 mL/min (A) (by C-G formula  based on SCr of 1.88 mg/dL (H)). Liver Function  Tests: Recent Labs  Lab 06/21/24 1519  AST 23  ALT 15  ALKPHOS 82  BILITOT 0.9  PROT 6.2*  ALBUMIN 3.4*   No results for input(s): LIPASE, AMYLASE in the last 168 hours. No results for input(s): AMMONIA in the last 168 hours. Coagulation Profile: No results for input(s): INR, PROTIME in the last 168 hours. Cardiac Enzymes: No results for input(s): CKTOTAL, CKMB, CKMBINDEX, TROPONINI in the last 168 hours. BNP (last 3 results) Recent Labs    03/22/24 1306  PROBNP 318.0*   HbA1C: No results for input(s): HGBA1C in the last 72 hours. CBG: Recent Labs  Lab 06/21/24 1521  GLUCAP 79   Lipid Profile: No results for input(s): CHOL, HDL, LDLCALC, TRIG, CHOLHDL, LDLDIRECT in the last 72 hours. Thyroid  Function Tests: No results for input(s): TSH, T4TOTAL, FREET4, T3FREE, THYROIDAB in the last 72 hours. Anemia Panel: No results for input(s): VITAMINB12, FOLATE, FERRITIN, TIBC, IRON , RETICCTPCT in the last 72 hours. Urine analysis:    Component Value Date/Time   COLORURINE YELLOW 06/21/2024 2244   APPEARANCEUR CLOUDY (A) 06/21/2024 2244   LABSPEC 1.009 06/21/2024 2244   PHURINE 6.0 06/21/2024 2244   GLUCOSEU NEGATIVE 06/21/2024 2244   HGBUR SMALL (A) 06/21/2024 2244   BILIRUBINUR NEGATIVE 06/21/2024 2244   BILIRUBINUR negative 06/14/2024 1605   KETONESUR NEGATIVE 06/21/2024 2244   PROTEINUR NEGATIVE 06/21/2024 2244   UROBILINOGEN 0.2 06/14/2024 1605   UROBILINOGEN 0.2 07/13/2009 1040   NITRITE NEGATIVE 06/21/2024 2244   LEUKOCYTESUR TRACE (A) 06/21/2024 2244    Radiological Exams on Admission: DG Chest Portable 1 View Result Date: 06/21/2024 CLINICAL DATA:  Altered mental status EXAM: PORTABLE CHEST 1 VIEW COMPARISON:  06/15/2023 FINDINGS: Cardiomegaly. No confluent airspace opacities, effusions or edema. No acute bony abnormality. IMPRESSION: Cardiomegaly.  No active disease. Electronically Signed   By: Franky Crease  M.D.   On: 06/21/2024 17:29   CT Head Wo Contrast Result Date: 06/21/2024 CLINICAL DATA:  Mental status change, unknown cause. Hallucinations. EXAM: CT HEAD WITHOUT CONTRAST TECHNIQUE: Contiguous axial images were obtained from the base of the skull through the vertex without intravenous contrast. RADIATION DOSE REDUCTION: This exam was performed according to the departmental dose-optimization program which includes automated exposure control, adjustment of the mA and/or kV according to patient size and/or use of iterative reconstruction technique. COMPARISON:  Head CT 05/02/2007 FINDINGS: Brain: There is no evidence of an acute infarct, intracranial hemorrhage, mass, midline shift, or extra-axial fluid collection. Progressive generalized cerebral atrophy is mild for age. Patchy cerebral white matter hypodensities are nonspecific but compatible with mild chronic small vessel ischemic disease. Vascular: Calcified atherosclerosis at the skull base. No hyperdense vessel. Skull: No acute fracture or suspicious lesion. Sinuses/Orbits: Visualized paranasal sinuses and mastoid air cells are clear. Bilateral cataract extraction. Other: None. IMPRESSION: 1. No evidence of acute intracranial abnormality. 2. Mild chronic small vessel ischemic disease. Electronically Signed   By: Dasie Hamburg M.D.   On: 06/21/2024 16:56    EKG: Independently reviewed.   Assessment/Plan  Partially treated UTI due to  Proteus  ( POA)  -UA note bacteria and LE , but >10 wbc insetting of abx use x 1 week and delirium -admit to tele  -continue with CTX -f/u on repeat culture   Encephalopathy  -on baseline dementia  -related to acute medical illness    Chronic b/l lower extremity swelling  ? Cellulitis  -presumed due to chronic venostasis however cannot  r/o possible superimposed  infection -will add vanc to regimen , can de-escalate as able    CHFpef -no overt exacerbation  -continue lisinopril  and metoprolol   Hx of upper  GI bleed -ppi  -no current bleeding -hgb 10.9 was 12.4 , however baseline is around 11 - monitor h/h   HLD -resume home regimen    HTN -stable    MCI/Dementia -continue on home regimen   CKDIV -at baseline   Hypothyroidism -resume synthroid   Chronic lower back pain -supportive care   DVT prophylaxis: heparin Code Status: full/ as discussed per patient wishes in event of cardiac arrest  Family Communication: none at bedside  Disposition Plan: patient  expected to be admitted greater than 2 midnights  Consults called: full/ as discussed per patient wishes in event of cardiac arrest  Admission status: med tele   Camila DELENA Ned MD [facility]   If 7PM-7AM, please contact night-coverage [email or web address] Password [email or web address]  06/22/2024, 12:32 AM

## 2024-06-22 NOTE — Progress Notes (Signed)
 Subjective: Patient admitted this morning, see detailed H&P by Dr Debby   89 y.o. female with medical history significant of  Hx of upper GI bleed, Dementia,HLD, HTN, MCI,CKDIV,hypothyroidism, CHFpef,  chronic lower back pain, Chronic b/l lower extremity edema,recent diagnosis of UTI one week ago for which she was treated with oral antibiotics. Of note urine cultured on 6/16 noted Proteus which was pan-sensitive other than noted resistance to ciprofloxacin and macrobid. Vitals:   06/22/24 0251 06/22/24 0641  BP: (!) 127/98 (!) 157/81  Pulse: (!) 125 (!) 103  Resp:    Temp: 97.6 F (36.4 C)   SpO2: 99% 90%      A/P  Partially treated UTI due to  Proteus  ( POA)  -UA note bacteria and LE , but >10 wbc insetting of abx use x 1 week and delirium - Started on IV ceftriaxone    Encephalopathy  -baseline dementia  -related to acute medical illness  -Continue Aricept      Chronic b/l lower extremity swelling  ? Cellulitis  -presumed due to chronic venostasis however cannot r/o possible superimposed  infection - Continue IV Rocephin   CHFpef -no overt exacerbation  -continue lisinopril  and metoprolol    Hx of upper GI bleed -ppi  -no current bleeding - Hemoglobin stable at 12.8   HLD -resume home regimen     HTN -stable     MCI/Dementia -continue on home regimen    CKDIV -at baseline    Hypothyroidism -resume synthroid    Chronic lower back pain -supportive care    [name] Brod [facility]

## 2024-06-23 ENCOUNTER — Telehealth (HOSPITAL_COMMUNITY): Payer: Self-pay | Admitting: Family Medicine

## 2024-06-23 ENCOUNTER — Telehealth: Payer: Self-pay | Admitting: Family Medicine

## 2024-06-23 DIAGNOSIS — N390 Urinary tract infection, site not specified: Secondary | ICD-10-CM | POA: Diagnosis not present

## 2024-06-23 LAB — CBC
HCT: 39.8 % (ref 36.0–46.0)
Hemoglobin: 11.8 g/dL — ABNORMAL LOW (ref 12.0–15.0)
MCH: 29 pg (ref 26.0–34.0)
MCHC: 29.6 g/dL — ABNORMAL LOW (ref 30.0–36.0)
MCV: 97.8 fL (ref 80.0–100.0)
Platelets: 195 10*3/uL (ref 150–400)
RBC: 4.07 MIL/uL (ref 3.87–5.11)
RDW: 14.1 % (ref 11.5–15.5)
WBC: 7.6 10*3/uL (ref 4.0–10.5)
nRBC: 0 % (ref 0.0–0.2)

## 2024-06-23 LAB — COMPREHENSIVE METABOLIC PANEL WITH GFR
ALT: 10 U/L (ref 0–44)
AST: 20 U/L (ref 15–41)
Albumin: 2.7 g/dL — ABNORMAL LOW (ref 3.5–5.0)
Alkaline Phosphatase: 72 U/L (ref 38–126)
Anion gap: 10 (ref 5–15)
BUN: 22 mg/dL (ref 8–23)
CO2: 26 mmol/L (ref 22–32)
Calcium: 9.3 mg/dL (ref 8.9–10.3)
Chloride: 101 mmol/L (ref 98–111)
Creatinine, Ser: 2.02 mg/dL — ABNORMAL HIGH (ref 0.44–1.00)
GFR, Estimated: 23 mL/min — ABNORMAL LOW (ref >60)
Glucose, Bld: 79 mg/dL (ref 70–99)
Potassium: 3.4 mmol/L — ABNORMAL LOW (ref 3.5–5.1)
Sodium: 137 mmol/L (ref 135–145)
Total Bilirubin: 0.7 mg/dL (ref 0.0–1.2)
Total Protein: 5.4 g/dL — ABNORMAL LOW (ref 6.5–8.1)

## 2024-06-23 LAB — C DIFFICILE QUICK SCREEN W PCR REFLEX
C Diff antigen: NEGATIVE
C Diff interpretation: NOT DETECTED
C Diff toxin: NEGATIVE

## 2024-06-23 MED ORDER — POTASSIUM CHLORIDE CRYS ER 20 MEQ PO TBCR
40.0000 meq | EXTENDED_RELEASE_TABLET | Freq: Once | ORAL | Status: AC
  Administered 2024-06-23: 40 meq via ORAL
  Filled 2024-06-23: qty 2

## 2024-06-23 NOTE — [place] FL2 (Signed)
 [place]  MEDICAID FL2 LEVEL OF CARE FORM     IDENTIFICATION  Patient Name: [name] Birthdate: 05/14/1930 Sex: female Admission Date (Current Location): 06/21/2024  [facility] and [place] Number:  Producer, television/film/video and Address:  [facility],  [address] [place], [place] [address]      Provider Number: [id number]  Attending Physician Name and Address:  Jillian Buttery, MD  Relative Name and Phone Number:  Beverley Lamer (daughter) Ph: [phone]    Current Level of Care: Hospital Recommended Level of Care: Skilled Nursing Facility Prior Approval Number:    Date Approved/Denied:   PASRR Number: [id number]  Discharge Plan: SNF    Current Diagnoses: Patient Active Problem List   Diagnosis Date Noted   UTI (urinary tract infection) 06/22/2024   Chronic right shoulder pain 05/06/2024   Adnexal cyst 04/10/2024   Rectal inflammation 04/10/2024   Stasis dermatitis of both legs 03/05/2024   Episode of confusion 01/30/2024   Spinal stenosis of lumbar region with radiculopathy 10/23/2023   Chronic low back pain with right-sided sciatica 10/06/2023   Compression fracture of L2 lumbar vertebra, closed, initial encounter (HCC) 08/29/2023   Lumbar facet arthropathy 08/29/2023   Anterolisthesis of lumbar spine 08/29/2023   Right leg pain 08/06/2023   Impaired mobility and personal care 08/06/2023   Polypharmacy 03/17/2023   GERD (gastroesophageal reflux disease) 09/18/2022   Pain due to onychomycosis of toenails of both feet 08/28/2022   Thickened nails 05/14/2022   MCI (mild cognitive impairment) with memory loss 05/14/2022   Bilateral lower extremity edema 03/13/2022   Allergic rhinitis 12/15/2021   Corneal epithelial basement membrane dystrophy 04/14/2021   Secondary hyperparathyroidism of renal origin (HCC) 10/12/2020   Constipation 09/21/2020   Angiodysplasia of colon with hemorrhage    Benign neoplasm of ascending colon    Benign neoplasm of descending  colon    Iron  deficiency anemia    Chronic diastolic CHF (congestive heart failure) (HCC) 03/12/2020   Urge incontinence 09/01/2019   Systolic murmur 09/01/2019   Atypical nevi 11/30/2018   Family history of lung cancer 11/30/2018   Pseudogout of left wrist 04/04/2018   Thoracic back pain 09/23/2016   Osteoporosis 03/30/2016   Advanced care planning/counseling discussion 03/13/2015   Health maintenance examination 03/13/2015   CKD (chronic kidney disease) stage 4, GFR 15-29 ml/min (HCC) 03/10/2014   Medicare annual wellness visit, subsequent 12/17/2012   CTS (carpal tunnel syndrome) 10/27/2012   Exertional dyspnea 10/27/2012   Osteoarthritis    MDD (major depressive disorder), recurrent episode, moderate (HCC)    History of ulcer disease    Anemia    Hypothyroidism 03/11/2012   Hyperlipidemia 03/11/2012   Hypertension 03/11/2012    Orientation RESPIRATION BLADDER Height & Weight     Self, Time, Situation, Place  Normal Incontinent Weight: 194 lb 10.7 oz (88.3 kg) Height:  5' 1 (154.9 cm)  BEHAVIORAL SYMPTOMS/MOOD NEUROLOGICAL BOWEL NUTRITION STATUS      Incontinent Diet (Heart healthy diet)  AMBULATORY STATUS COMMUNICATION OF NEEDS Skin   Extensive Assist Verbally Other (Comment) (Erythema: bilateral legs)                       Personal Care Assistance Level of Assistance  Bathing, Feeding, Dressing Bathing Assistance: Limited assistance Feeding assistance: Independent Dressing Assistance: Limited assistance     Functional Limitations Info  Sight, Hearing, Speech Sight Info: Impaired Hearing Info: Impaired Speech Info: Adequate    SPECIAL CARE FACTORS FREQUENCY  PT (  By licensed PT), OT (By licensed OT)     PT Frequency: 5x's/week OT Frequency: 5x's/week            Contractures Contractures Info: Not present    Additional Factors Info  Code Status, Allergies Code Status Info: Full Allergies Info: Trazodone  And Nefazodone           Current  Medications (06/23/2024):  This is the current hospital active medication list Current Facility-Administered Medications  Medication Dose Route Frequency Provider Last Rate Last Admin   acetaminophen  (TYLENOL ) tablet 650 mg  650 mg Oral Q6H PRN Debby Camila LABOR, MD       Or   acetaminophen  (TYLENOL ) suppository 650 mg  650 mg Rectal Q6H PRN Debby Camila LABOR, MD       acetaminophen  (TYLENOL ) tablet 1,000 mg  1,000 mg Oral Daily Thomas, Sara-Maiz A, MD   1,000 mg at 06/23/24 1108   albuterol  (PROVENTIL ) (2.5 MG/3ML) 0.083% nebulizer solution 2.5 mg  2.5 mg Nebulization Q2H PRN Thomas, Sara-Maiz A, MD       calcium  carbonate (OS-CAL - dosed in mg of elemental calcium ) tablet 1,250 mg  1 tablet Oral Q breakfast Lama, Gagan S, MD   1,250 mg at 06/23/24 1109   cefTRIAXone (ROCEPHIN) 1 g in sodium chloride  0.9 % 100 mL IVPB  1 g Intravenous Q24H Drusilla Sabas RAMAN, MD   Stopped at 06/22/24 2354   donepezil  (ARICEPT ) tablet 10 mg  10 mg Oral QHS Thomas, Sara-Maiz A, MD   10 mg at 06/22/24 2150   ferrous sulfate  tablet 325 mg  325 mg Oral Q M,W,F Debby Camila A, MD   325 mg at 06/23/24 1111   FLUoxetine  (PROZAC ) capsule 40 mg  40 mg Oral Daily Thomas, Sara-Maiz A, MD   40 mg at 06/23/24 1108   heparin injection 5,000 Units  5,000 Units Subcutaneous Q8H Debby Camila A, MD   5,000 Units at 06/23/24 1433   hydrALAZINE  (APRESOLINE ) tablet 25 mg  25 mg Oral BID Thomas, Sara-Maiz A, MD   25 mg at 06/23/24 1109   levothyroxine  (SYNTHROID ) tablet 88 mcg  88 mcg Oral Q0600 Debby Camila LABOR, MD   88 mcg at 06/23/24 9483   metoprolol  succinate (TOPROL -XL) 24 hr tablet 25 mg  25 mg Oral Daily Thomas, Sara-Maiz A, MD   25 mg at 06/22/24 1241   ondansetron  (ZOFRAN ) tablet 4 mg  4 mg Oral Q6H PRN Debby Camila LABOR, MD       Or   ondansetron  (ZOFRAN ) injection 4 mg  4 mg Intravenous Q6H PRN Debby Camila LABOR, MD       pantoprazole  (PROTONIX ) EC tablet 80 mg  80 mg Oral Daily Debby Camila A, MD   80  mg at 06/23/24 1108   pravastatin  (PRAVACHOL ) tablet 40 mg  40 mg Oral QHS Thomas, Sara-Maiz A, MD   40 mg at 06/22/24 2150   torsemide  (DEMADEX ) tablet 10 mg  10 mg Oral Daily Thomas, Sara-Maiz A, MD   10 mg at 06/23/24 1111     Discharge Medications: Please see discharge summary for a list of discharge medications.  Relevant Imaging Results:  Relevant Lab Results:   Additional Information SSN: [id number]  Duwaine RAMAN Aran, LCSW

## 2024-06-23 NOTE — Evaluation (Addendum)
 Physical Therapy Evaluation Patient Details Name: [name] MRN: [id number] DOB: 07/31/1930 Today's Date: 06/23/2024  History of Present Illness  [name] is a 89 yr old female admitted to the hospita; with altered mental status and was found to have a UTI and encephalopathy. PMH: arthritis, OP, back surgery, bilateral knee surgery, GI bleed, dementia, HLD, HTN, mild cognitive impairment, CKD IV, CHF, low back pain, chronic B LE edema  Clinical Impression  Pt admitted with above diagnosis.  Pt currently with functional limitations due to the deficits listed below (see PT Problem List). Pt will benefit from acute skilled PT to increase their independence and safety with mobility to allow discharge.     The patient  required mod assistance for mobility, standing and ambulating 3' using a RW. Patient comes from Independent Living, uses a rollator  and has recently been staying with daughter due to requiring increased level of care.  Patient  was noted to be DOE with activity.  Patient will benefit from continued inpatient follow up therapy, <3 hours/day.      If plan is discharge home, recommend the following: Two people to help with walking and/or transfers;A lot of help with bathing/dressing/bathroom;Assist for transportation;Help with stairs or ramp for entrance   Can travel by private vehicle        Equipment Recommendations None recommended by PT  Recommendations for Other Services       Functional Status Assessment Patient has had a recent decline in their functional status and demonstrates the ability to make significant improvements in function in a reasonable and predictable amount of time.     Precautions / Restrictions Precautions Precautions: Fall Precaution/Restrictions Comments: incontinent B/B, needs a depends Restrictions Weight Bearing Restrictions Per Provider Order: No      Mobility  Bed Mobility   Bed Mobility: Sit to Supine     Supine to sit: Mod  assist     General bed mobility comments: assist legs ontobed    Transfers Overall transfer level: Needs assistance Equipment used: Rolling walker (2 wheels) Transfers: Sit to/from Stand, Bed to chair/wheelchair/BSC Sit to Stand: Min assist   Step pivot transfers: Min assist       General transfer comment: multimodal cues  for safety with RW,    Ambulation/Gait Ambulation/Gait assistance: Mod assist Gait Distance (Feet): 3 Feet Assistive device: Rolling walker (2 wheels) Gait Pattern/deviations: Step-to pattern       General Gait Details: increased anxiousness when stepping  Stairs            Wheelchair Mobility     Tilt Bed    Modified Rankin (Stroke Patients Only)       Balance Overall balance assessment: History of Falls, Needs assistance Sitting-balance support: Feet supported, No upper extremity supported Sitting balance-Leahy Scale: Fair     Standing balance support: Bilateral upper extremity supported, During functional activity, Reliant on assistive device for balance Standing balance-Leahy Scale: Poor                               Pertinent Vitals/Pain Pain Assessment Pain Assessment: Faces Faces Pain Scale: Hurts even more Pain Location: left leg when flexed sitting in recliner Pain Descriptors / Indicators: Discomfort, Guarding, Grimacing Pain Intervention(s): Monitored during session, Repositioned    Home Living Family/patient expects to be discharged to:: Private residence Living Arrangements: Alone Available Help at Discharge: Personal care attendant Type of Home: Independent living facility Home Access:  Elevator       Home Layout: One level Home Equipment: Rollator (4 wheels);BSC/3in1;Shower seat;Grab bars - tub/shower;Grab bars - toilet Additional Comments: pt. dtr provided info, pt. was staying w/ dtr  and requiring more and more asistance for ADL's mobility,    Prior Function Prior Level of Function : Needs  assist             Mobility Comments: used a rollator for ambulation. was alone in apt until ~ 1 week ago ADLs Comments: The pt has a sitter 3 days per week from 11:00 a.m. to 3:00 p.m. who assists the pt with lower body dressing, putting on her bra, and bathing. Her family brings meals or the sitter prepares them. The pt does not drive. Pt's daughter reported the pt has had ongoing issues with impaired memory.     Extremity/Trunk Assessment   Upper Extremity Assessment Upper Extremity Assessment: Right hand dominant (limited shoulder elevation)    Lower Extremity Assessment Lower Extremity Assessment: RLE deficits/detail;LLE deficits/detail;Generalized weakness RLE Deficits / Details: noted edema and skin color changes, grossly LLE Deficits / Details: same as right    Cervical / Trunk Assessment Cervical / Trunk Assessment: Kyphotic  Communication   Communication Communication: Impaired Factors Affecting Communication: Hearing impaired    Cognition Arousal: Alert Behavior During Therapy: Anxious   PT - Cognitive impairments: History of cognitive impairments                         Following commands: Impaired Following commands impaired: Follows one step commands inconsistently     Cueing Cueing Techniques: Verbal cues, Gestural cues, Tactile cues     General Comments      Exercises General Exercises - Lower Extremity Ankle Circles/Pumps: AROM, Both, 10 reps Long Arc Quad: AROM, 10 reps, Both, Seated Hip Flexion/Marching: AROM, 10 reps, Both, Seated Other Exercises Other Exercises: hip rotations x 10, both legs , seated   Assessment/Plan    PT Assessment Patient needs continued PT services  PT Problem List Decreased strength;Decreased mobility;Decreased safety awareness;Decreased range of motion;Decreased knowledge of precautions;Decreased activity tolerance;Decreased cognition       PT Treatment Interventions DME instruction;Therapeutic  activities;Cognitive remediation;Gait training;Therapeutic exercise;Patient/family education;Functional mobility training    PT Goals (Current goals can be found in the Care Plan section)  Acute Rehab PT Goals Patient Stated Goal: go to rehab PT Goal Formulation: With patient/family Time For Goal Achievement: 07/07/24 Potential to Achieve Goals: Fair    Frequency Min 2X/week     Co-evaluation               AM-PAC PT 6 Clicks Mobility  Outcome Measure Help needed turning from your back to your side while in a flat bed without using bedrails?: A Little Help needed moving from lying on your back to sitting on the side of a flat bed without using bedrails?: A Lot Help needed moving to and from a bed to a chair (including a wheelchair)?: A Lot Help needed standing up from a chair using your arms (e.g., wheelchair or bedside chair)?: A Lot Help needed to walk in hospital room?: Total Help needed climbing 3-5 steps with a railing? : Total 6 Click Score: 11    End of Session Equipment Utilized During Treatment: Gait belt Activity Tolerance: Patient limited by fatigue Patient left: in bed;with call bell/phone within reach;with nursing/sitter in room;with family/visitor present;with bed alarm set Nurse Communication: Mobility status PT Visit Diagnosis: Unsteadiness on feet (R26.81);History  of falling (Z91.81);Repeated falls (R29.6);Other abnormalities of gait and mobility (R26.89);Difficulty in walking, not elsewhere classified (R26.2)    Time: 8584-8561 PT Time Calculation (min) (ACUTE ONLY): 23 min   Charges:   PT Evaluation $PT Eval Low Complexity: 1 Low PT Treatments $Therapeutic Activity: 8-22 mins PT General Charges $$ ACUTE PT VISIT: 1 Visit         Darice Potters PT Acute Rehabilitation Services Office [phone]   Potters Darice Norris 06/23/2024, 3:24 PM

## 2024-06-23 NOTE — Telephone Encounter (Signed)
 See 06/22/24 phn note

## 2024-06-23 NOTE — TOC Progression Note (Signed)
 Transition of Care [facility]) - Progression Note   Patient Details  Name: [name] MRN: [id number] Date of Birth: [birth year]  [facility] CM/SW Contact  Duwaine GORMAN Aran, LCSW Phone Number: 06/23/2024, 4:02 PM  Clinical Narrative: Granddaughter called again to follow up on SNF request. CSW explained TOC is awaiting a PT evaluation recommendation. Granddaughter requested [facility] again.  PT evaluation completed and SNF was recommended. FL2 done; PASRR received. Initial referral faxed out. CSW asked Dwayne in admissions at [facility] to review the referral. TOC awaiting bed offers.  Expected Discharge Plan: Skilled Nursing Facility Barriers to Discharge: Continued Medical Work up, English as a second language teacher, SNF Pending bed offer  Expected Discharge Plan and Services In-house Referral: Clinical Social Work Post Acute Care Choice: Skilled Nursing Facility Living arrangements for the past 2 months: Apartment, Independent Living Facility           DME Arranged: N/A DME Agency: NA  Social Determinants of Health (SDOH) Interventions SDOH Screenings   Food Insecurity: Patient Unable To Answer (06/22/2024)  Housing: Unknown (06/22/2024)  Transportation Needs: Patient Unable To Answer (06/22/2024)  Utilities: Patient Unable To Answer (06/22/2024)  Depression (PHQ2-9): High Risk (06/14/2024)  Financial Resource Strain: Low Risk  (04/19/2022)  Physical Activity: Insufficiently Active (04/19/2022)  Social Connections: Patient Unable To Answer (06/22/2024)  Stress: No Stress Concern Present (04/19/2022)  Tobacco Use: Medium Risk (06/22/2024)   Readmission Risk Interventions     No data to display

## 2024-06-23 NOTE — Progress Notes (Signed)
 PROGRESS NOTE  [name]  FMW:[id number] DOB: December 07, 1930 DOA: 06/21/2024 PCP: Rilla Baller, MD   Brief Narrative: Patient is an 89 year old female with history of upper GI bleed, dementia, hyperlipidemia, hypertension, CKD stage IV, hypothyroidism, HFpEF, chronic low back pain, chronic bilateral lower extremity edema, UTI who presented with confusion.  Urine culture on 6/16 had shown Proteus.  Patient is from ALF.  PT consulted.  Possible plan for SNF  Assessment & Plan:  Principal Problem:   UTI (urinary tract infection)   Suspected UTI: Urine culture on 6/16 showed Proteus.  Report of treatment with Augmentin but unsure of duration.  Currently on ceftriaxone.  Looks like urine culture not sent during this admission .Urine culture not sent on this admission  Acute metabolic encephalopathy: Has history of dementia.  More confused from baseline.  Likely from UTI.  Continue to monitor.  Delirium precautions  Chronic bilateral lower extremity edema: Likely from chronic  venous insufficiency.  Cellulitis not ruled out.  Currently on ceftriaxone Will de-escalate antibiotics as soon as possible  HFpEF: On lisinopril , metoprolol .  Elevated BNP.  Given IV Lasix , now on hold.  On torsemide  10 mg  Hyperlipidemia: Continue home statin  Hypokalemia: Supplemented with potassium  Hypertension: Currently on lisinopril , metoprolol .  Monitor blood pressure  CKD stage IV: Currently kidney function at baseline  Hypothyroidism: Continue Synthyroid  Diarrhea: Will send GI pathogen panel  Chronic low back pain: Continue supportive care.  PT consulted.  Patient is from ALF.  Family requesting for SNF        DVT prophylaxis:heparin injection 5,000 Units Start: 06/22/24 0600     Code Status: Full Code  Family Communication: Discussed with granddaughter, daughter at bedside  Patient status:Inpatient  Patient is from :ALF  Anticipated discharge to:SNF  Estimated DC date:1-2  days   Consultants: None  Procedures:None  Antimicrobials:  Anti-infectives (From admission, onward)    Start     Dose/Rate Route Frequency Ordered Stop   06/23/24 0000  cefTRIAXone (ROCEPHIN) 1 g in sodium chloride  0.9 % 100 mL IVPB        1 g 200 mL/hr over 30 Minutes Intravenous Every 24 hours 06/22/24 0811     06/22/24 0730  linezolid (ZYVOX) IVPB 600 mg  Status:  Discontinued        600 mg 300 mL/hr over 60 Minutes Intravenous Every 12 hours 06/22/24 0618 06/22/24 0810   06/21/24 2345  cefTRIAXone (ROCEPHIN) 1 g in sodium chloride  0.9 % 100 mL IVPB        1 g 200 mL/hr over 30 Minutes Intravenous  Once 06/21/24 2338 06/22/24 0205       Subjective: Patient seen and examined at the bedside today.  She was sitting on the chair.  Family around bedside.  She is not in any apparent distress.  She is confused at baseline.  She is not somnolent.  She communicates well but not oriented to time.  Denies any abdomen pain, dysuria.  Afebrile  Objective: Vitals:   06/22/24 1240 06/22/24 1247 06/22/24 1954 06/23/24 0431  BP: (!) 156/63  (!) 145/64 (!) 156/72  Pulse: 80  60 (!) 57  Resp: 20     Temp: 97.9 F (36.6 C)  98.5 F (36.9 C) 98.4 F (36.9 C)  TempSrc: Oral  Oral Oral  SpO2: 96%  94% 93%  Weight:  88.3 kg    Height:  5' 1 (1.549 m)      Intake/Output Summary (Last 24 hours) at 06/23/2024  1244 Last data filed at 06/23/2024 0434 Gross per 24 hour  Intake 340 ml  Output 1225 ml  Net -885 ml   Filed Weights   06/22/24 1247  Weight: 88.3 kg    Examination:  General exam: Overall comfortable, not in distress, obese, HEENT: PERRL Respiratory system:  no wheezes or crackles  Cardiovascular system: S1 & S2 heard, RRR.  Gastrointestinal system: Abdomen is nondistended, soft and nontender. Central nervous system: Alert and awake, oriented to place only Extremities: Trace bilateral lower extremity pitting edema, no clubbing ,no cyanosis Skin: No rashes, no ulcers,no  icterus     Data Reviewed: I have personally reviewed following labs and imaging studies  CBC: Recent Labs  Lab 06/21/24 1519 06/22/24 0211 06/22/24 0405 06/23/24 0411  WBC 7.4 8.6 9.7 7.6  HGB 12.4 10.9* 12.8 11.8*  HCT 41.4 36.7 42.5 39.8  MCV 97.2 97.3 97.3 97.8  PLT 224 189 194 195   Basic Metabolic Panel: Recent Labs  Lab 06/21/24 1519 06/22/24 0211 06/22/24 0405 06/23/24 0411  NA 138  --  141 137  K 4.0  --  3.4* 3.4*  CL 102  --  103 101  CO2 25  --  22 26  GLUCOSE 79  --  78 79  BUN 24*  --  24* 22  CREATININE 1.88* 1.57* 1.78* 2.02*  CALCIUM  10.1  --  10.1 9.3     Recent Results (from the past 240 hours)  Urine Culture     Status: Abnormal   Collection Time: 06/14/24  5:06 PM   Specimen: Urine  Result Value Ref Range Status   MICRO NUMBER: [id number]  Final   SPECIMEN QUALITY: Adequate  Final   Sample Source URINE  Final   STATUS: FINAL  Final   ISOLATE 1: Proteus mirabilis (A)  Final    Comment: Greater than 100,000 CFU/mL of Proteus mirabilis      Susceptibility   Proteus mirabilis - URINE CULTURE, REFLEX    AMOX/CLAVULANIC <=2 Sensitive     AMPICILLIN/SULBACTAM <=2 Sensitive     CEFAZOLIN* <=4 Not Reportable      * For infections other than uncomplicated UTI caused by E. coli, K. pneumoniae or P. mirabilis: Cefazolin is resistant if MIC > or = 8 mcg/mL. (Distinguishing susceptible versus intermediate for isolates with MIC < or = 4 mcg/mL requires additional testing.) For uncomplicated UTI caused by E. coli, K. pneumoniae or P. mirabilis: Cefazolin is susceptible if MIC <32 mcg/mL and predicts susceptible to the oral agents cefaclor, cefdinir, cefpodoxime, cefprozil, cefuroxime, cephalexin  and loracarbef.     CEFTAZIDIME <=1 Sensitive     CEFEPIME <=0.12 Sensitive     CEFTRIAXONE <=0.25 Sensitive     CIPROFLOXACIN >=4 Resistant     GENTAMICIN <=1 Sensitive     MEROPENEM <=0.25 Sensitive     NITROFURANTOIN 64 Resistant     PIP/TAZO <=4  Sensitive     TRIMETH/SULFA* <=20 Sensitive      * For infections other than uncomplicated UTI caused by E. coli, K. pneumoniae or P. mirabilis: Cefazolin is resistant if MIC > or = 8 mcg/mL. (Distinguishing susceptible versus intermediate for isolates with MIC < or = 4 mcg/mL requires additional testing.) For uncomplicated UTI caused by E. coli, K. pneumoniae or P. mirabilis: Cefazolin is susceptible if MIC <32 mcg/mL and predicts susceptible to the oral agents cefaclor, cefdinir, cefpodoxime, cefprozil, cefuroxime, cephalexin  and loracarbef. Legend: S = Susceptible  I = Intermediate R = Resistant  NS =  Not susceptible SDD = Susceptible Dose Dependent * = Not Tested  NR = Not Reported **NN = See Therapy Comments   Culture, blood (Routine X 2) w Reflex to ID Panel     Status: None (Preliminary result)   Collection Time: 06/22/24  4:06 AM   Specimen: BLOOD RIGHT HAND  Result Value Ref Range Status   Specimen Description   Final    BLOOD RIGHT HAND Performed at [facility], [address], [place], [place] [address]    Special Requests   Final    BOTTLES DRAWN AEROBIC ONLY Blood Culture results may not be optimal due to an inadequate volume of blood received in culture bottles Performed at [facility], [address] [place], [place] [address]    Culture   Final    NO GROWTH 1 DAY Performed at [facility], [address] [place], [place] [address]    Report Status PENDING  Incomplete  Culture, blood (Routine X 2) w Reflex to ID Panel     Status: None (Preliminary result)   Collection Time: 06/22/24  4:11 AM   Specimen: BLOOD RIGHT ARM  Result Value Ref Range Status   Specimen Description   Final    BLOOD RIGHT ARM Performed at [facility], [address], [place], [place] [address]    Special Requests   Final    BOTTLES DRAWN AEROBIC ONLY Blood Culture results may not be optimal due to an inadequate volume of blood received in  culture bottles Performed at [facility], [address] [place], [place] [address]    Culture   Final    NO GROWTH 1 DAY Performed at [facility], [address], [place], [place] [address]    Report Status PENDING  Incomplete     Radiology Studies: US  RENAL Result Date: 06/22/2024 CLINICAL DATA:  Urinary tract infection EXAM: RENAL / URINARY TRACT ULTRASOUND COMPLETE COMPARISON:  CT from 03/31/2024 FINDINGS: Right Kidney: Renal measurements: 10.5 x 5.1 x 5.2 cm. = volume: 146 mL. Increased echogenicity is noted. No mass lesion or hydronephrosis is seen. Left Kidney: Renal measurements: 9.8 x 5.9 x 5.2 cm. = volume: 156 mL. Increased echogenicity is noted. No mass or hydronephrosis is noted. Bladder: Appears normal for degree of bladder distention. Other: None. IMPRESSION: Changes consistent with medical renal disease. No other focal abnormality is noted. Electronically Signed   By: Oneil Devonshire M.D.   On: 06/22/2024 02:13   DG Chest Portable 1 View Result Date: 06/21/2024 CLINICAL DATA:  Altered mental status EXAM: PORTABLE CHEST 1 VIEW COMPARISON:  06/15/2023 FINDINGS: Cardiomegaly. No confluent airspace opacities, effusions or edema. No acute bony abnormality. IMPRESSION: Cardiomegaly.  No active disease. Electronically Signed   By: Franky Crease M.D.   On: 06/21/2024 17:29   CT Head Wo Contrast Result Date: 06/21/2024 CLINICAL DATA:  Mental status change, unknown cause. Hallucinations. EXAM: CT HEAD WITHOUT CONTRAST TECHNIQUE: Contiguous axial images were obtained from the base of the skull through the vertex without intravenous contrast. RADIATION DOSE REDUCTION: This exam was performed according to the departmental dose-optimization program which includes automated exposure control, adjustment of the mA and/or kV according to patient size and/or use of iterative reconstruction technique. COMPARISON:  Head CT 05/02/2007 FINDINGS: Brain: There is no evidence of an  acute infarct, intracranial hemorrhage, mass, midline shift, or extra-axial fluid collection. Progressive generalized cerebral atrophy is mild for age. Patchy cerebral white matter  hypodensities are nonspecific but compatible with mild chronic small vessel ischemic disease. Vascular: Calcified atherosclerosis at the skull base. No hyperdense vessel. Skull: No acute fracture or suspicious lesion. Sinuses/Orbits: Visualized paranasal sinuses and mastoid air cells are clear. Bilateral cataract extraction. Other: None. IMPRESSION: 1. No evidence of acute intracranial abnormality. 2. Mild chronic small vessel ischemic disease. Electronically Signed   By: Dasie Hamburg M.D.   On: 06/21/2024 16:56    Scheduled Meds:  acetaminophen   1,000 mg Oral Daily   calcium  carbonate  1 tablet Oral Q breakfast   donepezil   10 mg Oral QHS   ferrous sulfate   325 mg Oral Q M,W,F   FLUoxetine   40 mg Oral Daily   heparin  5,000 Units Subcutaneous Q8H   hydrALAZINE   25 mg Oral BID   levothyroxine   88 mcg Oral Q0600   metoprolol  succinate  25 mg Oral Daily   pantoprazole   80 mg Oral Daily   potassium chloride   40 mEq Oral Once   pravastatin   40 mg Oral QHS   torsemide   10 mg Oral Daily   Continuous Infusions:  cefTRIAXone (ROCEPHIN)  IV Stopped (06/22/24 2354)     LOS: 1 day   Ivonne Mustache, MD [facility] P6/25/2025, 12:44 PM

## 2024-06-23 NOTE — Progress Notes (Signed)
 PT Cancellation Note  Patient Details Name: [name] MRN: [id number] DOB: 03-24-1930   Cancelled Treatment:    Reason Eval/Treat Not Completed: Other (comment)  Patient up w/ OT, now in bed eating. Will check back later. Darice Potters PT Acute Rehabilitation Services Office [phone]  Potters Darice Norris 06/23/2024, 12:39 PM

## 2024-06-23 NOTE — Telephone Encounter (Signed)
 Following message copied (see 06/23/24 phn note):  Copied from CRM [id number]. Topic: General - Other >> Jun 22, 2024  5:20 PM Taleah C wrote: Reason for CRM: patient's daughter, Beverley, called to ask if Dr. Shaune nurse could give her a call for advice about what she should do with the patient's Upcoming appt's since she is currently hospitalized. Please call and advise.  Dr KANDICE, do you want pt to go ahead and r/s ECHO?

## 2024-06-23 NOTE — Evaluation (Signed)
 Occupational Therapy Evaluation Patient Details Name: [name] MRN: [id number] DOB: November 04, 1930 Today's Date: 06/23/2024   History of Present Illness   [name] is a 89 yr old female admitted to the hospita; with altered mental status and was found to have a UTI and encephalopathy. PMH: arthritis, OP, back surgery, bilateral knee surgery, GI bleed, dementia, HLD, HTN, mild cognitive impairment, CKD IV, CHF, low back pain, chronic B LE edema     Clinical Impressions The pt is currently presenting with the below listed deficits (see OT problem list). As such, her occupational performance is compromised and she requires assist for self-care management. During the session, she required min assist to stand using a RW, max assist for lower body dressing, and max assist for toileting at bathroom level. She was noted to demo intermittent memory impairment. OT spoke with the Brittany daughter via phone; she expressed an interest in short-term SNF rehab for the pt. The family also has plans to transition the pt from ILF to ALF. The pt will benefit from further OT services to maximize her safety and independence with self-care tasks. Patient will benefit from continued inpatient follow up therapy, <3 hours/day.      If plan is discharge home, recommend the following:   A little help with walking and/or transfers;A lot of help with bathing/dressing/bathroom;Direct supervision/assist for medications management;Direct supervision/assist for financial management;Supervision due to cognitive status;Assistance with cooking/housework     Functional Status Assessment   Patient has had a recent decline in their functional status and demonstrates the ability to make significant improvements in function in a reasonable and predictable amount of time.     Equipment Recommendations   Other (comment) (defer to next level of care)     Recommendations for Other Services          Precautions/Restrictions   Precautions Precautions: Fall Restrictions Weight Bearing Restrictions Per Provider Order: No     Mobility Bed Mobility Overal bed mobility: Needs Assistance Bed Mobility: Supine to Sit, Sit to Supine     Supine to sit: Contact guard, HOB elevated, Used rails Sit to supine: Contact guard assist        Transfers Overall transfer level: Needs assistance Equipment used: Rolling walker (2 wheels) Transfers: Sit to/from Stand Sit to Stand: Min assist                  ADL either performed or assessed with clinical judgement   ADL Overall ADL's : Needs assistance/impaired Eating/Feeding: Set up;Sitting   Grooming: Set up;Supervision/safety;Sitting           Upper Body Dressing : Moderate assistance;Sitting   Lower Body Dressing: Maximal assistance;Sitting/lateral leans Lower Body Dressing Details (indicate cue type and reason): max assist needed to donn socks seated EOB Toilet Transfer: Moderate assistance;Rolling walker (2 wheels);Grab bars;Ambulation Toilet Transfer Details (indicate cue type and reason): Pt required instrution for hand placement on grab bar and to push with B LE. Toileting- Clothing Manipulation and Hygiene: Maximal assistance;Sit to/from stand;Cueing for safety Toileting - Clothing Manipulation Details (indicate cue type and reason): The pt required significant assist to perform peri-hygiene after a bowel movement, steadying assist in standing, and assist to perform clothing management.              Pertinent Vitals/Pain Pain Assessment Pain Assessment: No/denies pain     Extremity/Trunk Assessment Upper Extremity Assessment Upper Extremity Assessment: Overall WFL for tasks assessed;Right hand dominant   Lower Extremity Assessment Lower Extremity Assessment: Generalized  weakness       Communication Communication Factors Affecting Communication: Hearing impaired   Cognition Arousal: Alert    Cognition: History of cognitive impairments, Cognition impaired   Orientation impairments: Time, Situation   Memory impairment (select all impairments): Short-term memory Attention impairment (select first level of impairment): Divided attention                       Home Living Family/patient expects to be discharged to:: Unsure Living Arrangements: Alone Available Help at Discharge: Personal care attendant Type of Home: Independent living facility       Home Layout: One level                   Additional Comments: Info obtained regarding the Brittany prior level of functioning and living situation was obtained via phone conversation with the Brittany daughter, Wu.      Prior Functioning/Environment Prior Level of Function : Needs assist             Mobility Comments: Thept used a rollator for ambulation. ADLs Comments: The pt has a sitter 3 days per week from 11:00 a.m. to 3:00 p.m. who assists the pt with lower body dressing, putting on her bra, and bathing. Her family brings meals or the sitter prepares them. The pt does not drive. Brittany daughter reported the pt has had ongoing issues with impaired memory.    OT Problem List: Decreased strength;Impaired balance (sitting and/or standing);Decreased cognition;Decreased safety awareness;Decreased knowledge of use of DME or AE   OT Treatment/Interventions: Self-care/ADL training;Therapeutic exercise;Therapeutic activities;Energy conservation;DME and/or AE instruction;Patient/family education;Balance training      OT Goals(Current goals can be found in the care plan section)   Acute Rehab OT Goals OT Goal Formulation: With patient Time For Goal Achievement: 07/07/24 Potential to Achieve Goals: Good ADL Goals Pt Will Perform Grooming: standing;with contact guard assist Pt Will Perform Lower Body Dressing: with contact guard assist;sitting/lateral leans;sit to/from stand Pt Will Transfer to Toilet: with contact  guard assist;ambulating Pt Will Perform Toileting - Clothing Manipulation and hygiene: with contact guard assist;sit to/from stand   OT Frequency:  Min 2X/week       AM-PAC OT 6 Clicks Daily Activity     Outcome Measure Help from another person eating meals?: A Little Help from another person taking care of personal grooming?: A Little Help from another person toileting, which includes using toliet, bedpan, or urinal?: A Lot Help from another person bathing (including washing, rinsing, drying)?: A Lot Help from another person to put on and taking off regular upper body clothing?: A Lot Help from another person to put on and taking off regular lower body clothing?: A Lot 6 Click Score: 14   End of Session Equipment Utilized During Treatment: Rolling walker (2 wheels);Gait belt Nurse Communication: Mobility status  Activity Tolerance: Patient tolerated treatment well Patient left: in bed;with call bell/phone within reach;with bed alarm set;with nursing/sitter in room  OT Visit Diagnosis: Unsteadiness on feet (R26.81);Muscle weakness (generalized) (M62.81);Other symptoms and signs involving cognitive function                Time: 1024-1057 OT Time Calculation (min): 33 min Charges:  OT General Charges $OT Visit: 1 Visit OT Evaluation $OT Eval Moderate Complexity: 1 Mod OT Treatments $Self Care/Home Management : 8-22 mins  Delanna LITTIE Molt, OTR/L 06/23/2024, 1:52 PM

## 2024-06-23 NOTE — Telephone Encounter (Signed)
 Patient called and cancelled echocardiogram for reason below:  06/23/2024 12:34 PM Ab:Wu, Brittany M  Cancel Rsn: Hospitalized (Patient is in the hospital)   Order will be removed from the echo WQ and if patient calls back to reschedule we will reinstate the order. Thank you

## 2024-06-23 NOTE — Telephone Encounter (Signed)
 Copied from CRM [id number]. Topic: General - Other >> Jun 22, 2024  5:20 PM Taleah C wrote: Reason for CRM: patient's daughter, Beverley, called to ask if Dr. Shaune nurse could give her a call for advice about what she should do with the patient's Upcoming appt's since she is currently hospitalized. Please call and advise.

## 2024-06-24 DIAGNOSIS — N390 Urinary tract infection, site not specified: Secondary | ICD-10-CM | POA: Diagnosis not present

## 2024-06-24 LAB — BASIC METABOLIC PANEL WITH GFR
Anion gap: 11 (ref 5–15)
BUN: 21 mg/dL (ref 8–23)
CO2: 26 mmol/L (ref 22–32)
Calcium: 9.6 mg/dL (ref 8.9–10.3)
Chloride: 103 mmol/L (ref 98–111)
Creatinine, Ser: 2.05 mg/dL — ABNORMAL HIGH (ref 0.44–1.00)
GFR, Estimated: 22 mL/min — ABNORMAL LOW (ref >60)
Glucose, Bld: 102 mg/dL — ABNORMAL HIGH (ref 70–99)
Potassium: 3 mmol/L — ABNORMAL LOW (ref 3.5–5.1)
Sodium: 140 mmol/L (ref 135–145)

## 2024-06-24 LAB — MAGNESIUM: Magnesium: 1.8 mg/dL (ref 1.7–2.4)

## 2024-06-24 MED ORDER — LOPERAMIDE HCL 2 MG PO CAPS
2.0000 mg | ORAL_CAPSULE | Freq: Four times a day (QID) | ORAL | Status: DC | PRN
  Administered 2024-06-24: 2 mg via ORAL
  Filled 2024-06-24: qty 1

## 2024-06-24 MED ORDER — CEPHALEXIN 500 MG PO CAPS
500.0000 mg | ORAL_CAPSULE | Freq: Two times a day (BID) | ORAL | Status: DC
  Administered 2024-06-24 – 2024-06-25 (×3): 500 mg via ORAL
  Filled 2024-06-24 (×3): qty 1

## 2024-06-24 MED ORDER — POTASSIUM CHLORIDE CRYS ER 20 MEQ PO TBCR
40.0000 meq | EXTENDED_RELEASE_TABLET | ORAL | Status: AC
  Administered 2024-06-24 (×2): 40 meq via ORAL
  Filled 2024-06-24 (×2): qty 2

## 2024-06-24 NOTE — Progress Notes (Signed)
 Pt had large diarrhea with streaks of bright red blood noted, MD Adhikari notified and stool collected.

## 2024-06-24 NOTE — Plan of Care (Signed)

## 2024-06-24 NOTE — TOC Progression Note (Signed)
 Transition of Care [facility]) - Progression Note   Patient Details  Name: [name] MRN: [id number] Date of Birth: February 04, 1930  [facility] CM/SW Contact  Duwaine GORMAN Aran, LCSW Phone Number: 06/24/2024, 1:51 PM  Clinical Narrative: Patient received a bed offer from [facility], which was accepted by granddaughter. CSW confirmed bed with Tracey in admissions. Patient is expected to be medically ready tomorrow. CSW completed insurance authorization on [employer] portal. Reference ID# is: [id number]. Patient is approved for 06/25/2024-06/29/2024. CSW provided insurance approval information to [place]. CSW left voicemail for granddaughter, Eleanor, regarding insurance approval.  Expected Discharge Plan: Skilled Nursing Facility Barriers to Discharge: Continued Medical Work up  Expected Discharge Plan and Services In-house Referral: Clinical Social Work Post Acute Care Choice: Skilled Nursing Facility Living arrangements for the past 2 months: Apartment, Independent Living Facility           DME Arranged: N/A DME Agency: NA  Social Determinants of Health (SDOH) Interventions SDOH Screenings   Food Insecurity: Patient Unable To Answer (06/22/2024)  Housing: Unknown (06/22/2024)  Transportation Needs: Patient Unable To Answer (06/22/2024)  Utilities: Patient Unable To Answer (06/22/2024)  Depression (PHQ2-9): High Risk (06/14/2024)  Financial Resource Strain: Low Risk  (04/19/2022)  Physical Activity: Insufficiently Active (04/19/2022)  Social Connections: Patient Unable To Answer (06/22/2024)  Stress: No Stress Concern Present (04/19/2022)  Tobacco Use: Medium Risk (06/22/2024)   Readmission Risk Interventions     No data to display

## 2024-06-24 NOTE — Progress Notes (Signed)
 PROGRESS NOTE  CHIA MOWERS  FMW:[id number] DOB: 09/17/1930 DOA: 06/21/2024 PCP: Rilla Baller, MD   Brief Narrative: Patient is an 89 year old female with history of upper GI bleed, dementia, hyperlipidemia, hypertension, CKD stage IV, hypothyroidism, HFpEF, chronic low back pain, chronic bilateral lower extremity edema, UTI who presented with confusion.  Urine culture on 6/16 had shown Proteus.  Patient is from ALF.  PT consulted.  Plan  for SNF.  Waiting for GI pathogen panel  Assessment & Plan:  Principal Problem:   UTI (urinary tract infection)   Suspected UTI: Urine culture on 6/16 showed Proteus.  Report of treatment with Augmentin but unsure of duration.  She was on ceftriaxone.  Looks like urine culture not sent during this admission .Abx changed to keflex   Acute metabolic encephalopathy: Has history of dementia.  More confused from baseline.  Likely from UTI.  Continue to monitor.  Delirium precautions.  Which currently she is around her baseline  Chronic bilateral lower extremity edema: Likely from chronic  venous insufficiency.  Continue torsemide   HFpEF: On lisinopril , metoprolol .  Elevated BNP.  Given IV Lasix , now on hold.  On torsemide  10 mg  Hyperlipidemia: Continue home statin  Hypokalemia: Supplemented with potassium  Hypertension: Currently on lisinopril , metoprolol .  Monitor blood pressure  CKD stage IV: Currently kidney function at baseline  Hypothyroidism: Continue Synthyroid  Diarrhea: C. difficile negative.  Likely from antibiotic.  Pending GI pathogen panel  Chronic low back pain: Continue supportive care.  PT consulted.  Patient is from ALF.  Family requesting for SNF        DVT prophylaxis:heparin injection 5,000 Units Start: 06/22/24 0600     Code Status: Full Code  Family Communication: Discussed with granddaughter [name] on phone on 6/26  Patient status:Inpatient  Patient is from :ALF  Anticipated discharge to:SNF  Estimated DC  date:1-2 days   Consultants: None  Procedures:None  Antimicrobials:  Anti-infectives (From admission, onward)    Start     Dose/Rate Route Frequency Ordered Stop   06/24/24 1200  cephALEXin  (KEFLEX ) capsule 500 mg        500 mg Oral Every 12 hours 06/24/24 1103 06/29/24 0959   06/23/24 0000  cefTRIAXone (ROCEPHIN) 1 g in sodium chloride  0.9 % 100 mL IVPB  Status:  Discontinued        1 g 200 mL/hr over 30 Minutes Intravenous Every 24 hours 06/22/24 0811 06/24/24 1103   06/22/24 0730  linezolid (ZYVOX) IVPB 600 mg  Status:  Discontinued        600 mg 300 mL/hr over 60 Minutes Intravenous Every 12 hours 06/22/24 0618 06/22/24 0810   06/21/24 2345  cefTRIAXone (ROCEPHIN) 1 g in sodium chloride  0.9 % 100 mL IVPB        1 g 200 mL/hr over 30 Minutes Intravenous  Once 06/21/24 2338 06/22/24 0205       Subjective: Patient seen and examined at bedside today.  Hemodynamically stable.  Overall looks comfortable, lying in bed.  Not in any Acute distress.  Confused to time.  Had a large watery bowel movement today.  Abdomen is soft and nontender.  Denies any abdominal pain  Objective: Vitals:   06/23/24 1312 06/23/24 2059 06/24/24 0548 06/24/24 0829  BP: (!) 107/59 (!) 131/54 (!) 119/52 (!) 144/67  Pulse: 91 73 80 88  Resp: 16 20 16  (!) 22  Temp: 97.6 F (36.4 C) 97.6 F (36.4 C) 97.6 F (36.4 C)   TempSrc:  Oral Oral  SpO2: 100% 96% 100% 97%  Weight:      Height:        Intake/Output Summary (Last 24 hours) at 06/24/2024 1347 Last data filed at 06/24/2024 0550 Gross per 24 hour  Intake --  Output 1900 ml  Net -1900 ml   Filed Weights   06/22/24 1247  Weight: 88.3 kg    Examination:  General exam: Overall comfortable, not in distress,obese HEENT: PERRL Respiratory system:  no wheezes or crackles  Cardiovascular system: S1 & S2 heard, RRR.  Gastrointestinal system: Abdomen is nondistended, soft and nontender. Central nervous system: Alert and oriented to place  only Extremities: trace bilateral lower extremity edema, no clubbing ,no cyanosis Skin: No rashes, no ulcers,no icterus      Data Reviewed: I have personally reviewed following labs and imaging studies  CBC: Recent Labs  Lab 06/21/24 1519 06/22/24 0211 06/22/24 0405 06/23/24 0411  WBC 7.4 8.6 9.7 7.6  HGB 12.4 10.9* 12.8 11.8*  HCT 41.4 36.7 42.5 39.8  MCV 97.2 97.3 97.3 97.8  PLT 224 189 194 195   Basic Metabolic Panel: Recent Labs  Lab 06/21/24 1519 06/22/24 0211 06/22/24 0405 06/23/24 0411 06/24/24 0351  NA 138  --  141 137 140  K 4.0  --  3.4* 3.4* 3.0*  CL 102  --  103 101 103  CO2 25  --  22 26 26   GLUCOSE 79  --  78 79 102*  BUN 24*  --  24* 22 21  CREATININE 1.88* 1.57* 1.78* 2.02* 2.05*  CALCIUM  10.1  --  10.1 9.3 9.6     Recent Results (from the past 240 hours)  Urine Culture     Status: Abnormal   Collection Time: 06/14/24  5:06 PM   Specimen: Urine  Result Value Ref Range Status   MICRO NUMBER: [id number]  Final   SPECIMEN QUALITY: Adequate  Final   Sample Source URINE  Final   STATUS: FINAL  Final   ISOLATE 1: Proteus mirabilis (A)  Final    Comment: Greater than 100,000 CFU/mL of Proteus mirabilis      Susceptibility   Proteus mirabilis - URINE CULTURE, REFLEX    AMOX/CLAVULANIC <=2 Sensitive     AMPICILLIN/SULBACTAM <=2 Sensitive     CEFAZOLIN* <=4 Not Reportable      * For infections other than uncomplicated UTI caused by E. coli, K. pneumoniae or P. mirabilis: Cefazolin is resistant if MIC > or = 8 mcg/mL. (Distinguishing susceptible versus intermediate for isolates with MIC < or = 4 mcg/mL requires additional testing.) For uncomplicated UTI caused by E. coli, K. pneumoniae or P. mirabilis: Cefazolin is susceptible if MIC <32 mcg/mL and predicts susceptible to the oral agents cefaclor, cefdinir, cefpodoxime, cefprozil, cefuroxime, cephalexin  and loracarbef.     CEFTAZIDIME <=1 Sensitive     CEFEPIME <=0.12 Sensitive     CEFTRIAXONE  <=0.25 Sensitive     CIPROFLOXACIN >=4 Resistant     GENTAMICIN <=1 Sensitive     MEROPENEM <=0.25 Sensitive     NITROFURANTOIN 64 Resistant     PIP/TAZO <=4 Sensitive     TRIMETH/SULFA* <=20 Sensitive      * For infections other than uncomplicated UTI caused by E. coli, K. pneumoniae or P. mirabilis: Cefazolin is resistant if MIC > or = 8 mcg/mL. (Distinguishing susceptible versus intermediate for isolates with MIC < or = 4 mcg/mL requires additional testing.) For uncomplicated UTI caused by E. coli, K. pneumoniae or P. mirabilis: Cefazolin is  susceptible if MIC <32 mcg/mL and predicts susceptible to the oral agents cefaclor, cefdinir, cefpodoxime, cefprozil, cefuroxime, cephalexin  and loracarbef. Legend: S = Susceptible  I = Intermediate R = Resistant  NS = Not susceptible SDD = Susceptible Dose Dependent * = Not Tested  NR = Not Reported **NN = See Therapy Comments   Culture, blood (Routine X 2) w Reflex to ID Panel     Status: None (Preliminary result)   Collection Time: 06/22/24  4:06 AM   Specimen: BLOOD RIGHT HAND  Result Value Ref Range Status   Specimen Description   Final    BLOOD RIGHT HAND Performed at [facility], [address], [place], [place] [address]    Special Requests   Final    BOTTLES DRAWN AEROBIC ONLY Blood Culture results may not be optimal due to an inadequate volume of blood received in culture bottles Performed at [facility], [address] [place], [place] [address]    Culture   Final    NO GROWTH 2 DAYS Performed at [facility], [address], [place], [place] [address]    Report Status PENDING  Incomplete  Culture, blood (Routine X 2) w Reflex to ID Panel     Status: None (Preliminary result)   Collection Time: 06/22/24  4:11 AM   Specimen: BLOOD RIGHT ARM  Result Value Ref Range Status   Specimen Description   Final    BLOOD RIGHT ARM Performed at [facility], [address]  [place], [place] [address]    Special Requests   Final    BOTTLES DRAWN AEROBIC ONLY Blood Culture results may not be optimal due to an inadequate volume of blood received in culture bottles Performed at [facility], [address], [place], [place] [address]    Culture   Final    NO GROWTH 2 DAYS Performed at [facility], [address] [place], [place] [address]    Report Status PENDING  Incomplete  C Difficile Quick Screen w PCR reflex     Status: None   Collection Time: 06/23/24  2:53 PM   Specimen: STOOL  Result Value Ref Range Status   C Diff antigen NEGATIVE NEGATIVE Final   C Diff toxin NEGATIVE NEGATIVE Final   C Diff interpretation No C. difficile detected.  Final    Comment: Performed at [facility], [address], [place], [place] [address]     Radiology Studies: No results found.   Scheduled Meds:  acetaminophen   1,000 mg Oral Daily   calcium  carbonate  1 tablet Oral Q breakfast   cephALEXin   500 mg Oral Q12H   donepezil   10 mg Oral QHS   ferrous sulfate   325 mg Oral Q M,W,F   FLUoxetine   40 mg Oral Daily   heparin  5,000 Units Subcutaneous Q8H   hydrALAZINE   25 mg Oral BID   levothyroxine   88 mcg Oral Q0600   metoprolol  succinate  25 mg Oral Daily   pantoprazole   80 mg Oral Daily   pravastatin   40 mg Oral QHS   torsemide   10 mg Oral Daily   Continuous Infusions:     LOS: 2 days   Ivonne Mustache, MD [facility] P6/26/2025, 1:47 PM

## 2024-06-25 DIAGNOSIS — E039 Hypothyroidism, unspecified: Secondary | ICD-10-CM | POA: Diagnosis not present

## 2024-06-25 DIAGNOSIS — G309 Alzheimer's disease, unspecified: Secondary | ICD-10-CM | POA: Diagnosis not present

## 2024-06-25 DIAGNOSIS — R600 Localized edema: Secondary | ICD-10-CM | POA: Diagnosis not present

## 2024-06-25 DIAGNOSIS — N184 Chronic kidney disease, stage 4 (severe): Secondary | ICD-10-CM | POA: Diagnosis not present

## 2024-06-25 DIAGNOSIS — N390 Urinary tract infection, site not specified: Secondary | ICD-10-CM | POA: Diagnosis not present

## 2024-06-25 DIAGNOSIS — R0602 Shortness of breath: Secondary | ICD-10-CM | POA: Diagnosis not present

## 2024-06-25 DIAGNOSIS — I5032 Chronic diastolic (congestive) heart failure: Secondary | ICD-10-CM | POA: Diagnosis not present

## 2024-06-25 DIAGNOSIS — E785 Hyperlipidemia, unspecified: Secondary | ICD-10-CM | POA: Diagnosis not present

## 2024-06-25 DIAGNOSIS — Z7401 Bed confinement status: Secondary | ICD-10-CM | POA: Diagnosis not present

## 2024-06-25 DIAGNOSIS — I10 Essential (primary) hypertension: Secondary | ICD-10-CM | POA: Diagnosis not present

## 2024-06-25 DIAGNOSIS — G9340 Encephalopathy, unspecified: Secondary | ICD-10-CM | POA: Diagnosis not present

## 2024-06-25 DIAGNOSIS — I959 Hypotension, unspecified: Secondary | ICD-10-CM | POA: Diagnosis not present

## 2024-06-25 DIAGNOSIS — F0390 Unspecified dementia without behavioral disturbance: Secondary | ICD-10-CM | POA: Diagnosis not present

## 2024-06-25 LAB — GASTROINTESTINAL PANEL BY PCR, STOOL (REPLACES STOOL CULTURE)

## 2024-06-25 LAB — BASIC METABOLIC PANEL WITH GFR
Anion gap: 11 (ref 5–15)
BUN: 21 mg/dL (ref 8–23)
CO2: 25 mmol/L (ref 22–32)
Calcium: 9.5 mg/dL (ref 8.9–10.3)
Chloride: 105 mmol/L (ref 98–111)
Creatinine, Ser: 1.94 mg/dL — ABNORMAL HIGH (ref 0.44–1.00)
GFR, Estimated: 24 mL/min — ABNORMAL LOW (ref >60)
Glucose, Bld: 101 mg/dL — ABNORMAL HIGH (ref 70–99)
Potassium: 3.6 mmol/L (ref 3.5–5.1)
Sodium: 141 mmol/L (ref 135–145)

## 2024-06-25 MED ORDER — HALOPERIDOL LACTATE 5 MG/ML IJ SOLN
1.0000 mg | Freq: Once | INTRAMUSCULAR | Status: AC
  Administered 2024-06-25: 1 mg via INTRAVENOUS
  Filled 2024-06-25: qty 1

## 2024-06-25 MED ORDER — LOPERAMIDE HCL 2 MG PO CAPS: 2.0000 mg | ORAL_CAPSULE | Freq: Four times a day (QID) | ORAL | Status: AC | PRN

## 2024-06-25 MED ORDER — MELATONIN 5 MG PO TABS: 5.0000 mg | ORAL_TABLET | Freq: Every evening | ORAL | Status: DC | PRN

## 2024-06-25 MED ORDER — CEPHALEXIN 500 MG PO CAPS: 500.0000 mg | ORAL_CAPSULE | Freq: Two times a day (BID) | ORAL | Status: AC

## 2024-06-25 NOTE — Plan of Care (Signed)
  Problem: Education: Goal: Knowledge of General Education information will improve Description: Including pain rating scale, medication(s)/side effects and non-pharmacologic comfort measures 06/25/2024 1157 by Fuller Hezzie HERO, RN Outcome: Completed/Met 06/25/2024 1156 by Fuller Hezzie HERO, RN Outcome: Progressing   Problem: Health Behavior/Discharge Planning: Goal: Ability to manage health-related needs will improve 06/25/2024 1157 by Fuller Hezzie HERO, RN Outcome: Completed/Met 06/25/2024 1156 by Fuller Hezzie HERO, RN Outcome: Progressing   Problem: Clinical Measurements: Goal: Ability to maintain clinical measurements within normal limits will improve 06/25/2024 1157 by Fuller Hezzie HERO, RN Outcome: Completed/Met 06/25/2024 1156 by Fuller Hezzie HERO, RN Outcome: Progressing Goal: Will remain free from infection 06/25/2024 1157 by Fuller Hezzie HERO, RN Outcome: Completed/Met 06/25/2024 1156 by Fuller Hezzie HERO, RN Outcome: Progressing Goal: Diagnostic test results will improve 06/25/2024 1157 by Fuller Hezzie HERO, RN Outcome: Completed/Met 06/25/2024 1156 by Fuller Hezzie HERO, RN Outcome: Progressing Goal: Respiratory complications will improve 06/25/2024 1157 by Fuller Hezzie HERO, RN Outcome: Completed/Met 06/25/2024 1156 by Fuller Hezzie HERO, RN Outcome: Progressing Goal: Cardiovascular complication will be avoided 06/25/2024 1157 by Fuller Hezzie HERO, RN Outcome: Completed/Met 06/25/2024 1156 by Fuller Hezzie HERO, RN Outcome: Progressing   Problem: Activity: Goal: Risk for activity intolerance will decrease 06/25/2024 1157 by Fuller Hezzie HERO, RN Outcome: Completed/Met 06/25/2024 1156 by Fuller Hezzie HERO, RN Outcome: Progressing   Problem: Nutrition: Goal: Adequate nutrition will be maintained 06/25/2024 1157 by Fuller Hezzie HERO, RN Outcome: Completed/Met 06/25/2024 1156 by Fuller Hezzie HERO, RN Outcome: Progressing   Problem: Coping: Goal: Level  of anxiety will decrease 06/25/2024 1157 by Fuller Hezzie HERO, RN Outcome: Completed/Met 06/25/2024 1156 by Fuller Hezzie HERO, RN Outcome: Progressing   Problem: Elimination: Goal: Will not experience complications related to bowel motility 06/25/2024 1157 by Fuller Hezzie HERO, RN Outcome: Completed/Met 06/25/2024 1156 by Fuller Hezzie HERO, RN Outcome: Progressing Goal: Will not experience complications related to urinary retention 06/25/2024 1157 by Fuller Hezzie HERO, RN Outcome: Completed/Met 06/25/2024 1156 by Fuller Hezzie HERO, RN Outcome: Progressing   Problem: Pain Managment: Goal: General experience of comfort will improve and/or be controlled 06/25/2024 1157 by Fuller Hezzie HERO, RN Outcome: Completed/Met 06/25/2024 1156 by Fuller Hezzie HERO, RN Outcome: Progressing   Problem: Safety: Goal: Ability to remain free from injury will improve 06/25/2024 1157 by Fuller Hezzie HERO, RN Outcome: Completed/Met 06/25/2024 1156 by Fuller Hezzie HERO, RN Outcome: Progressing   Problem: Skin Integrity: Goal: Risk for impaired skin integrity will decrease 06/25/2024 1157 by Fuller Hezzie HERO, RN Outcome: Completed/Met 06/25/2024 1156 by Fuller Hezzie HERO, RN Outcome: Progressing

## 2024-06-25 NOTE — TOC Transition Note (Signed)
 Transition of Care [facility]) - Discharge Note  Patient Details  Name: [name] MRN: [id number] Date of Birth: 08-08-1930  Transition of Care [facility]) CM/SW Contact:  Duwaine GORMAN Aran, LCSW Phone Number: 06/25/2024, 11:51 AM  Clinical Narrative: Patient is medically ready to discharge to [facility]. Patient will go to room 102 and the number for report is [phone]. Discharge summary, discharge orders, and SNF transfer report faxed to facility in hub. Medical necessity form done; PTAR scheduled. Discharge packet completed. Granddaughter notified of transportation being set up. RN updated. TOC signing off.   Final next level of care: Skilled Nursing Facility Barriers to Discharge: Barriers Resolved  Patient Goals and CMS Choice Patient states their goals for this hospitalization and ongoing recovery are:: Go to [facility] for rehab CMS Medicare.gov Compare Post Acute Care list provided to:: Patient Represenative (must comment) Choice offered to / list presented to : Adult Children  Discharge Placement PASRR number recieved: 06/23/24     Patient chooses bed at: [facility], [place] Patient to be transferred to facility by: PTAR Name of family member notified: Eleanor Sensing (granddaughter) Patient and family notified of of transfer: 06/25/24  Discharge Plan and Services Additional resources added to the After Visit Summary for   In-house Referral: Clinical Social Work Post Acute Care Choice: Skilled Nursing Facility          DME Arranged: N/A DME Agency: NA  Social Drivers of Health (SDOH) Interventions SDOH Screenings   Food Insecurity: Patient Unable To Answer (06/22/2024)  Housing: Unknown (06/22/2024)  Transportation Needs: Patient Unable To Answer (06/22/2024)  Utilities: Patient Unable To Answer (06/22/2024)  Depression (PHQ2-9): High Risk (06/14/2024)  Financial Resource Strain: Low Risk  (04/19/2022)  Physical Activity: Insufficiently Active (04/19/2022)  Social  Connections: Patient Unable To Answer (06/22/2024)  Stress: No Stress Concern Present (04/19/2022)  Tobacco Use: Medium Risk (06/22/2024)   Readmission Risk Interventions     No data to display

## 2024-06-25 NOTE — Plan of Care (Signed)

## 2024-06-25 NOTE — Telephone Encounter (Addendum)
 Agree with cancelled echo.  Spoke with daughter Beverley.  Mrs Cathlean got discharged today, continued improvement after abx course and significant IV diuresis, moving to [facility], transitioning to Medicaid.

## 2024-06-25 NOTE — Plan of Care (Signed)

## 2024-06-25 NOTE — Discharge Summary (Signed)
 Physician Discharge Summary  [name] FMW:[id number] DOB: [birth year] DOA: 06/21/2024  PCP: Rilla Baller, MD  Admit date: 06/21/2024 Discharge date: 06/25/2024  Admitted From: Home Disposition:  SNF  Discharge Condition:Stable CODE STATUS:FULL Diet recommendation: Heart Healthy  Brief/Interim Summary:  Patient is an 89 year old female with history of upper GI bleed, dementia, hyperlipidemia, hypertension, CKD stage IV, hypothyroidism, HFpEF, chronic low back pain, chronic bilateral lower extremity edema, UTI who presented with confusion.  Urine culture on 6/16 had shown Proteus.  Patient is from ALF.  PT consulted.  Plan  for SNF.  Medically stable for discharge.  Following problems were addressed during the hospitalization:  Suspected UTI: Urine culture on 6/16 showed Proteus.  Report of treatment with amoxicillin  but unsure of duration.  She was on ceftriaxone .  Urine culture not sent during this admission .Abx changed to keflex    Acute metabolic encephalopathy: Has history of dementia.  More confused from baseline. Suspected to be  from UTI.  Continue to monitor.  Delirium precautions.  Mentation currently she is around her baseline   Chronic bilateral lower extremity edema: Likely from chronic  venous insufficiency.  Continue torsemide    HFpEF: On lisinopril , metoprolol .  Elevated BNP.  Given IV Lasix , now on hold.  On torsemide  10 mg   Hyperlipidemia: Continue home statin   Hypokalemia: Supplemented with potassium and corrected   Hypertension: Currently on hydralazine , metoprolol .  Monitor blood pressure.  Lisinopril  stopped   CKD stage IV: Currently kidney function at baseline   Hypothyroidism: Continue Synthyroid   Diarrhea: C. difficile /GI pathogen panel negative.  Use Imodium  if needed  Chronic low back pain: Continue supportive care.  PT consulted.  Patient is from ALF.  Family requesting for SNF     Discharge Diagnoses:  Principal Problem:   UTI (urinary  tract infection)    Discharge Instructions  Discharge Instructions     Diet - low sodium heart healthy   Complete by: As directed    Discharge instructions   Complete by: As directed    1)Please take prescribed medications as instructed. 2)Follow up with your PCP in 2 weeks   Increase activity slowly   Complete by: As directed       Allergies as of 06/25/2024       Reactions   Trazodone  And Nefazodone Other (See Comments)   Worked against the patient        Medication List     STOP taking these medications    amoxicillin  875 MG tablet Commonly known as: AMOXIL    docusate sodium  100 MG capsule Commonly known as: COLACE   lisinopril  20 MG tablet Commonly known as: ZESTRIL    verapamil  180 MG CR tablet Commonly known as: CALAN -SR       TAKE these medications    acetaminophen  500 MG tablet Commonly known as: TYLENOL  Take 1,000 mg by mouth See admin instructions. Take 1,000 mg by mouth in the morning and an additional 1,000 mg once a day as needed for pain   ascorbic acid  500 MG tablet Commonly known as: VITAMIN C  Take 1 tablet (500 mg total) by mouth every Monday, Wednesday, and Friday. With iron  What changed:  when to take this additional instructions   calcium  gluconate 500 MG tablet Take 1 tablet (500 mg total) by mouth daily.   cephALEXin  500 MG capsule Commonly known as: KEFLEX  Take 1 capsule (500 mg total) by mouth every 12 (twelve) hours for 4 days.   cholecalciferol  1000 units tablet Commonly known  as: VITAMIN D  Take 1,000 Units by mouth daily.   denosumab  60 MG/ML Sosy injection Commonly known as: PROLIA  Inject 60 mg into the skin every 6 (six) months. Patient scheduled for 06/11/23   donepezil  10 MG tablet Commonly known as: ARICEPT  Take 1 tablet (10 mg total) by mouth at bedtime. For memory   famotidine  20 MG tablet Commonly known as: PEPCID  Take 20 mg by mouth daily.   FLUoxetine  40 MG capsule Commonly known as: PROZAC  TAKE  1 CAPSULE EVERY DAY   hydrALAZINE  25 MG tablet Commonly known as: APRESOLINE  TAKE 1 TABLET IN THE MORNING AND AT BEDTIME. What changed:  how much to take how to take this when to take this additional instructions   Iron  (Ferrous Sulfate ) 325 (65 Fe) MG Tabs Take 325 mg by mouth every Monday, Wednesday, and Friday.   levothyroxine  88 MCG tablet Commonly known as: SYNTHROID  TAKE 1 TABLET EVERY DAY BEFORE BREAKFAST   loperamide  2 MG capsule Commonly known as: IMODIUM  Take 1 capsule (2 mg total) by mouth every 6 (six) hours as needed for diarrhea or loose stools.   loratadine  10 MG tablet Commonly known as: CLARITIN  Take 1 tablet (10 mg total) by mouth every evening.   metoprolol  succinate 25 MG 24 hr tablet Commonly known as: TOPROL -XL Take 1 tablet (25 mg total) by mouth daily.   mirabegron  ER 25 MG Tb24 tablet Commonly known as: Myrbetriq  Take 1 tablet (25 mg total) by mouth daily. What changed: when to take this   omeprazole  40 MG capsule Commonly known as: PRILOSEC TAKE 1 CAPSULE EVERY DAY What changed: when to take this   polyethylene glycol 17 g packet Commonly known as: MiraLax  Take 17 g by mouth daily.   potassium chloride  10 MEQ tablet Commonly known as: KLOR-CON  M Take 1 tablet (10 mEq total) by mouth daily.   pravastatin  40 MG tablet Commonly known as: PRAVACHOL  Take 1 tablet (40 mg total) by mouth at bedtime.   SYSTANE BALANCE OP Place 1 drop into both eyes 3 (three) times daily as needed (for dryness).   torsemide  10 MG tablet Commonly known as: DEMADEX  Take 1 tablet (10 mg total) by mouth every other day. What changed: when to take this   vitamin B-12 500 MCG tablet Commonly known as: CYANOCOBALAMIN  Take 1 tablet (500 mcg total) by mouth once a week. What changed:  how much to take when to take this        Contact information for after-discharge care     Destination     [facility]   Service: Skilled  Nursing Contact information: [address] [place]  [phone]                    Allergies  Allergen Reactions   Trazodone  And Nefazodone Other (See Comments)    Worked against the patient    Consultations: None   Procedures/Studies: US  RENAL Result Date: 06/22/2024 CLINICAL DATA:  Urinary tract infection EXAM: RENAL / URINARY TRACT ULTRASOUND COMPLETE COMPARISON:  CT from 03/31/2024 FINDINGS: Right Kidney: Renal measurements: 10.5 x 5.1 x 5.2 cm. = volume: 146 mL. Increased echogenicity is noted. No mass lesion or hydronephrosis is seen. Left Kidney: Renal measurements: 9.8 x 5.9 x 5.2 cm. = volume: 156 mL. Increased echogenicity is noted. No mass or hydronephrosis is noted. Bladder: Appears normal for degree of bladder distention. Other: None. IMPRESSION: Changes consistent with medical renal disease. No other focal abnormality  is noted. Electronically Signed   By: Oneil Devonshire M.D.   On: 06/22/2024 02:13   DG Chest Portable 1 View Result Date: 06/21/2024 CLINICAL DATA:  Altered mental status EXAM: PORTABLE CHEST 1 VIEW COMPARISON:  06/15/2023 FINDINGS: Cardiomegaly. No confluent airspace opacities, effusions or edema. No acute bony abnormality. IMPRESSION: Cardiomegaly.  No active disease. Electronically Signed   By: Franky Crease M.D.   On: 06/21/2024 17:29   CT Head Wo Contrast Result Date: 06/21/2024 CLINICAL DATA:  Mental status change, unknown cause. Hallucinations. EXAM: CT HEAD WITHOUT CONTRAST TECHNIQUE: Contiguous axial images were obtained from the base of the skull through the vertex without intravenous contrast. RADIATION DOSE REDUCTION: This exam was performed according to the departmental dose-optimization program which includes automated exposure control, adjustment of the mA and/or kV according to patient size and/or use of iterative reconstruction technique. COMPARISON:  Head CT 05/02/2007 FINDINGS: Brain: There is no  evidence of an acute infarct, intracranial hemorrhage, mass, midline shift, or extra-axial fluid collection. Progressive generalized cerebral atrophy is mild for age. Patchy cerebral white matter hypodensities are nonspecific but compatible with mild chronic small vessel ischemic disease. Vascular: Calcified atherosclerosis at the skull base. No hyperdense vessel. Skull: No acute fracture or suspicious lesion. Sinuses/Orbits: Visualized paranasal sinuses and mastoid air cells are clear. Bilateral cataract extraction. Other: None. IMPRESSION: 1. No evidence of acute intracranial abnormality. 2. Mild chronic small vessel ischemic disease. Electronically Signed   By: Dasie Hamburg M.D.   On: 06/21/2024 16:56      Subjective: Patient seen and examined at bedside today.  Hemodynamically stable for discharge today.  Discharge plan discussed with granddaughter Eleanor on phone  Discharge Exam: Vitals:   06/25/24 0443 06/25/24 0935  BP: (!) 181/74 (!) 154/57  Pulse: 73   Resp: 18   Temp: 97.8 F (36.6 C)   SpO2: 95%    Vitals:   06/24/24 0829 06/24/24 2117 06/25/24 0443 06/25/24 0935  BP: (!) 144/67 (!) 131/103 (!) 181/74 (!) 154/57  Pulse: 88 62 73   Resp: (!) 22 19 18    Temp:  97.8 F (36.6 C) 97.8 F (36.6 C)   TempSrc:  Oral Oral   SpO2: 97% 95% 95%   Weight:      Height:        General: Pt is alert, awake, not in acute distress, pleasantly confused Cardiovascular: RRR, S1/S2 +, no rubs, no gallops Respiratory: CTA bilaterally, no wheezing, no rhonchi Abdominal: Soft, NT, ND, bowel sounds + Extremities: no edema, no cyanosis    The results of significant diagnostics from this hospitalization (including imaging, microbiology, ancillary and laboratory) are listed below for reference.     Microbiology: Recent Results (from the past 240 hours)  Culture, blood (Routine X 2) w Reflex to ID Panel     Status: None (Preliminary result)   Collection Time: 06/22/24  4:06 AM   Specimen:  BLOOD RIGHT HAND  Result Value Ref Range Status   Specimen Description   Final    BLOOD RIGHT HAND Performed at [facility], [address], [place], [place] [address]    Special Requests   Final    BOTTLES DRAWN AEROBIC ONLY Blood Culture results may not be optimal due to an inadequate volume of blood received in culture bottles Performed at [facility], [address] [place], [place] [address]    Culture   Final    NO GROWTH 3 DAYS Performed at [facility],  [address], [place], [place] [address]    Report Status PENDING  Incomplete  Culture, blood (Routine X 2) w Reflex to ID Panel     Status: None (Preliminary result)   Collection Time: 06/22/24  4:11 AM   Specimen: BLOOD RIGHT ARM  Result Value Ref Range Status   Specimen Description   Final    BLOOD RIGHT ARM Performed at [facility], [address] [place], [place] [address]    Special Requests   Final    BOTTLES DRAWN AEROBIC ONLY Blood Culture results may not be optimal due to an inadequate volume of blood received in culture bottles Performed at [facility], [address], [place], [place] [address]    Culture   Final    NO GROWTH 3 DAYS Performed at [facility], [address] [place], [place] [address]    Report Status PENDING  Incomplete  C Difficile Quick Screen w PCR reflex     Status: None   Collection Time: 06/23/24  2:53 PM   Specimen: STOOL  Result Value Ref Range Status   C Diff antigen NEGATIVE NEGATIVE Final   C Diff toxin NEGATIVE NEGATIVE Final   C Diff interpretation No C. difficile detected.  Final    Comment: Performed at [facility], [address], [place], [place] [address]  Gastrointestinal Panel by PCR , Stool     Status: None   Collection Time: 06/24/24 10:30 AM   Specimen: Stool  Result Value Ref Range Status   Campylobacter species NOT DETECTED NOT DETECTED Final   Plesimonas shigelloides NOT  DETECTED NOT DETECTED Final   Salmonella species NOT DETECTED NOT DETECTED Final   Yersinia enterocolitica NOT DETECTED NOT DETECTED Final   Vibrio species NOT DETECTED NOT DETECTED Final   Vibrio cholerae NOT DETECTED NOT DETECTED Final   Enteroaggregative E coli (EAEC) NOT DETECTED NOT DETECTED Final   Enteropathogenic E coli (EPEC) NOT DETECTED NOT DETECTED Final   Enterotoxigenic E coli (ETEC) NOT DETECTED NOT DETECTED Final   Shiga like toxin producing E coli (STEC) NOT DETECTED NOT DETECTED Final   Shigella/Enteroinvasive E coli (EIEC) NOT DETECTED NOT DETECTED Final   Cryptosporidium NOT DETECTED NOT DETECTED Final   Cyclospora cayetanensis NOT DETECTED NOT DETECTED Final   Entamoeba histolytica NOT DETECTED NOT DETECTED Final   Giardia lamblia NOT DETECTED NOT DETECTED Final   Adenovirus F40/41 NOT DETECTED NOT DETECTED Final   Astrovirus NOT DETECTED NOT DETECTED Final   Norovirus GI/GII NOT DETECTED NOT DETECTED Final   Rotavirus A NOT DETECTED NOT DETECTED Final   Sapovirus (I, II, IV, and V) NOT DETECTED NOT DETECTED Final    Comment: Performed at [facility], [address] [place], [place] [address]     Labs: BNP (last 3 results) Recent Labs    03/05/24 1636 06/21/24 1518  BNP 277* 430.7*   Basic Metabolic Panel: Recent Labs  Lab 06/21/24 1519 06/22/24 0211 06/22/24 0405 06/23/24 0411 06/24/24 0351 06/25/24 0335  NA 138  --  141 137 140 141  K 4.0  --  3.4* 3.4* 3.0* 3.6  CL 102  --  103 101 103 105  CO2 25  --  22 26 26 25   GLUCOSE 79  --  78 79 102* 101*  BUN 24*  --  24* 22 21 21   CREATININE 1.88* 1.57* 1.78* 2.02* 2.05* 1.94*  CALCIUM  10.1  --  10.1 9.3 9.6 9.5  MG  --   --   --   --  1.8  --    Liver Function Tests: Recent Labs  Lab 06/21/24 1519 06/22/24 0405 06/23/24 0411  AST 23 26 20   ALT 15 14 10   ALKPHOS 82 80 72  BILITOT 0.9 0.8 0.7  PROT 6.2* 6.3* 5.4*  ALBUMIN 3.4* 3.4* 2.7*   No results for input(s): LIPASE,  AMYLASE in the last 168 hours. No results for input(s): AMMONIA in the last 168 hours. CBC: Recent Labs  Lab 06/21/24 1519 06/22/24 0211 06/22/24 0405 06/23/24 0411  WBC 7.4 8.6 9.7 7.6  HGB 12.4 10.9* 12.8 11.8*  HCT 41.4 36.7 42.5 39.8  MCV 97.2 97.3 97.3 97.8  PLT 224 189 194 195   Cardiac Enzymes: No results for input(s): CKTOTAL, CKMB, CKMBINDEX, TROPONINI in the last 168 hours. BNP: Invalid input(s): POCBNP CBG: Recent Labs  Lab 06/21/24 1521  GLUCAP 79   D-Dimer No results for input(s): DDIMER in the last 72 hours. Hgb A1c No results for input(s): HGBA1C in the last 72 hours. Lipid Profile No results for input(s): CHOL, HDL, LDLCALC, TRIG, CHOLHDL, LDLDIRECT in the last 72 hours. Thyroid  function studies No results for input(s): TSH, T4TOTAL, T3FREE, THYROIDAB in the last 72 hours.  Invalid input(s): FREET3 Anemia work up No results for input(s): VITAMINB12, FOLATE, FERRITIN, TIBC, IRON , RETICCTPCT in the last 72 hours. Urinalysis    Component Value Date/Time   COLORURINE COLORLESS (A) 06/22/2024 0110   APPEARANCEUR CLEAR 06/22/2024 0110   LABSPEC 1.005 06/22/2024 0110   PHURINE 5.0 06/22/2024 0110   GLUCOSEU NEGATIVE 06/22/2024 0110   HGBUR SMALL (A) 06/22/2024 0110   BILIRUBINUR NEGATIVE 06/22/2024 0110   BILIRUBINUR negative 06/14/2024 1605   KETONESUR NEGATIVE 06/22/2024 0110   PROTEINUR NEGATIVE 06/22/2024 0110   UROBILINOGEN 0.2 06/14/2024 1605   UROBILINOGEN 0.2 07/13/2009 1040   NITRITE NEGATIVE 06/22/2024 0110   LEUKOCYTESUR NEGATIVE 06/22/2024 0110   Sepsis Labs Recent Labs  Lab 06/21/24 1519 06/22/24 0211 06/22/24 0405 06/23/24 0411  WBC 7.4 8.6 9.7 7.6   Microbiology Recent Results (from the past 240 hours)  Culture, blood (Routine X 2) w Reflex to ID Panel     Status: None (Preliminary result)   Collection Time: 06/22/24  4:06 AM   Specimen: BLOOD RIGHT HAND  Result Value Ref  Range Status   Specimen Description   Final    BLOOD RIGHT HAND Performed at [facility], [address], [place], [place] [address]    Special Requests   Final    BOTTLES DRAWN AEROBIC ONLY Blood Culture results may not be optimal due to an inadequate volume of blood received in culture bottles Performed at [facility], [address] [place], [place] [address]    Culture   Final    NO GROWTH 3 DAYS Performed at [facility], [address] [place], [place] [address]    Report Status PENDING  Incomplete  Culture, blood (Routine X 2) w Reflex to ID Panel     Status: None (Preliminary result)   Collection Time: 06/22/24  4:11 AM   Specimen: BLOOD RIGHT ARM  Result Value Ref Range Status   Specimen Description   Final    BLOOD RIGHT ARM Performed at [facility], [address], [place], [place] [address]    Special Requests   Final    BOTTLES DRAWN AEROBIC ONLY Blood Culture results may not be optimal due  to an inadequate volume of blood received in culture bottles Performed at [facility], [address], [place], [place] [address]    Culture   Final    NO GROWTH 3 DAYS Performed at [facility], [address] [place], [place] [address]    Report Status PENDING  Incomplete  C Difficile Quick Screen w PCR reflex     Status: None   Collection Time: 06/23/24  2:53 PM   Specimen: STOOL  Result Value Ref Range Status   C Diff antigen NEGATIVE NEGATIVE Final   C Diff toxin NEGATIVE NEGATIVE Final   C Diff interpretation No C. difficile detected.  Final    Comment: Performed at [facility], [address] [place], [place] [address]  Gastrointestinal Panel by PCR , Stool     Status: None   Collection Time: 06/24/24 10:30 AM   Specimen: Stool  Result Value Ref Range Status   Campylobacter species NOT DETECTED NOT DETECTED Final   Plesimonas shigelloides NOT DETECTED NOT DETECTED Final    Salmonella species NOT DETECTED NOT DETECTED Final   Yersinia enterocolitica NOT DETECTED NOT DETECTED Final   Vibrio species NOT DETECTED NOT DETECTED Final   Vibrio cholerae NOT DETECTED NOT DETECTED Final   Enteroaggregative E coli (EAEC) NOT DETECTED NOT DETECTED Final   Enteropathogenic E coli (EPEC) NOT DETECTED NOT DETECTED Final   Enterotoxigenic E coli (ETEC) NOT DETECTED NOT DETECTED Final   Shiga like toxin producing E coli (STEC) NOT DETECTED NOT DETECTED Final   Shigella/Enteroinvasive E coli (EIEC) NOT DETECTED NOT DETECTED Final   Cryptosporidium NOT DETECTED NOT DETECTED Final   Cyclospora cayetanensis NOT DETECTED NOT DETECTED Final   Entamoeba histolytica NOT DETECTED NOT DETECTED Final   Giardia lamblia NOT DETECTED NOT DETECTED Final   Adenovirus F40/41 NOT DETECTED NOT DETECTED Final   Astrovirus NOT DETECTED NOT DETECTED Final   Norovirus GI/GII NOT DETECTED NOT DETECTED Final   Rotavirus A NOT DETECTED NOT DETECTED Final   Sapovirus (I, II, IV, and V) NOT DETECTED NOT DETECTED Final    Comment: Performed at [facility], [address], [place], [place] [address]    Please note: You were cared for by a hospitalist during your hospital stay. Once you are discharged, your primary care physician will handle any further medical issues. Please note that NO REFILLS for any discharge medications will be authorized once you are discharged, as it is imperative that you return to your primary care physician (or establish a relationship with a primary care physician if you do not have one) for your post hospital discharge needs so that they can reassess your need for medications and monitor your lab values.    Time coordinating discharge: 40 minutes  SIGNED:   Ivonne Mustache, MD  [facility] 06/25/2024, 10:32 AM Pager [phone]  If 7PM-7AM, please contact night-coverage [email or web address] Password [email or web address]

## 2024-06-27 DIAGNOSIS — G309 Alzheimer's disease, unspecified: Secondary | ICD-10-CM | POA: Diagnosis not present

## 2024-06-27 DIAGNOSIS — G9340 Encephalopathy, unspecified: Secondary | ICD-10-CM | POA: Diagnosis not present

## 2024-06-27 DIAGNOSIS — I10 Essential (primary) hypertension: Secondary | ICD-10-CM | POA: Diagnosis not present

## 2024-06-27 DIAGNOSIS — N390 Urinary tract infection, site not specified: Secondary | ICD-10-CM | POA: Diagnosis not present

## 2024-06-27 LAB — CULTURE, BLOOD (ROUTINE X 2)
Culture: NO GROWTH
Culture: NO GROWTH

## 2024-06-28 ENCOUNTER — Ambulatory Visit (HOSPITAL_COMMUNITY)

## 2024-06-29 ENCOUNTER — Ambulatory Visit: Admitting: Cardiovascular Disease

## 2024-07-06 ENCOUNTER — Ambulatory Visit

## 2024-07-17 DIAGNOSIS — G309 Alzheimer's disease, unspecified: Secondary | ICD-10-CM | POA: Diagnosis not present

## 2024-07-17 DIAGNOSIS — N184 Chronic kidney disease, stage 4 (severe): Secondary | ICD-10-CM | POA: Diagnosis not present

## 2024-07-17 DIAGNOSIS — I872 Venous insufficiency (chronic) (peripheral): Secondary | ICD-10-CM | POA: Diagnosis not present

## 2024-07-17 DIAGNOSIS — I10 Essential (primary) hypertension: Secondary | ICD-10-CM | POA: Diagnosis not present

## 2024-07-20 DIAGNOSIS — I5030 Unspecified diastolic (congestive) heart failure: Secondary | ICD-10-CM | POA: Diagnosis not present

## 2024-07-27 DIAGNOSIS — I5030 Unspecified diastolic (congestive) heart failure: Secondary | ICD-10-CM | POA: Diagnosis not present

## 2024-07-28 DIAGNOSIS — I5030 Unspecified diastolic (congestive) heart failure: Secondary | ICD-10-CM | POA: Diagnosis not present

## 2024-07-29 ENCOUNTER — Ambulatory Visit: Admitting: Podiatry

## 2024-08-02 ENCOUNTER — Encounter: Admitting: Family Medicine

## 2024-08-16 ENCOUNTER — Telehealth: Payer: Self-pay | Admitting: Family Medicine

## 2024-08-16 NOTE — Telephone Encounter (Signed)
 Contacted [name] to schedule their annual wellness visit. Patient declined to schedule AWV at this time.Patient is now in a Nursing Home ( [facility] )  [facility] Care Guide [facility] AWV TEAM Direct Dial: [phone]

## 2024-08-20 DIAGNOSIS — I10 Essential (primary) hypertension: Secondary | ICD-10-CM | POA: Diagnosis not present

## 2024-08-20 DIAGNOSIS — I872 Venous insufficiency (chronic) (peripheral): Secondary | ICD-10-CM | POA: Diagnosis not present

## 2024-08-20 DIAGNOSIS — G309 Alzheimer's disease, unspecified: Secondary | ICD-10-CM | POA: Diagnosis not present

## 2024-08-20 DIAGNOSIS — N184 Chronic kidney disease, stage 4 (severe): Secondary | ICD-10-CM | POA: Diagnosis not present

## 2024-08-20 DIAGNOSIS — Z79899 Other long term (current) drug therapy: Secondary | ICD-10-CM | POA: Diagnosis not present

## 2024-08-21 ENCOUNTER — Other Ambulatory Visit: Payer: Self-pay | Admitting: Family Medicine

## 2024-08-21 DIAGNOSIS — K219 Gastro-esophageal reflux disease without esophagitis: Secondary | ICD-10-CM

## 2024-08-21 DIAGNOSIS — F331 Major depressive disorder, recurrent, moderate: Secondary | ICD-10-CM

## 2024-08-21 DIAGNOSIS — E039 Hypothyroidism, unspecified: Secondary | ICD-10-CM

## 2024-08-24 NOTE — Telephone Encounter (Signed)
 ERx

## 2024-08-26 DIAGNOSIS — R059 Cough, unspecified: Secondary | ICD-10-CM | POA: Diagnosis not present

## 2024-08-27 DIAGNOSIS — G309 Alzheimer's disease, unspecified: Secondary | ICD-10-CM | POA: Diagnosis not present

## 2024-08-27 DIAGNOSIS — U071 COVID-19: Secondary | ICD-10-CM | POA: Diagnosis not present

## 2024-09-03 ENCOUNTER — Encounter: Admitting: Physical Medicine & Rehabilitation

## 2024-09-07 DIAGNOSIS — Z79899 Other long term (current) drug therapy: Secondary | ICD-10-CM | POA: Diagnosis not present

## 2024-09-07 DIAGNOSIS — I5030 Unspecified diastolic (congestive) heart failure: Secondary | ICD-10-CM | POA: Diagnosis not present

## 2024-09-14 DIAGNOSIS — Z79899 Other long term (current) drug therapy: Secondary | ICD-10-CM | POA: Diagnosis not present

## 2024-09-14 DIAGNOSIS — I5030 Unspecified diastolic (congestive) heart failure: Secondary | ICD-10-CM | POA: Diagnosis not present

## 2024-09-20 DIAGNOSIS — G309 Alzheimer's disease, unspecified: Secondary | ICD-10-CM | POA: Diagnosis not present

## 2024-09-20 DIAGNOSIS — F0390 Unspecified dementia without behavioral disturbance: Secondary | ICD-10-CM | POA: Diagnosis not present

## 2024-09-20 DIAGNOSIS — F5101 Primary insomnia: Secondary | ICD-10-CM | POA: Diagnosis not present

## 2024-09-20 DIAGNOSIS — F338 Other recurrent depressive disorders: Secondary | ICD-10-CM | POA: Diagnosis not present

## 2024-09-21 DIAGNOSIS — I890 Lymphedema, not elsewhere classified: Secondary | ICD-10-CM | POA: Diagnosis not present

## 2024-09-21 DIAGNOSIS — L603 Nail dystrophy: Secondary | ICD-10-CM | POA: Diagnosis not present

## 2024-09-21 DIAGNOSIS — I739 Peripheral vascular disease, unspecified: Secondary | ICD-10-CM | POA: Diagnosis not present

## 2024-09-21 DIAGNOSIS — L602 Onychogryphosis: Secondary | ICD-10-CM | POA: Diagnosis not present

## 2024-10-08 DIAGNOSIS — I5030 Unspecified diastolic (congestive) heart failure: Secondary | ICD-10-CM | POA: Diagnosis not present

## 2024-10-08 DIAGNOSIS — N184 Chronic kidney disease, stage 4 (severe): Secondary | ICD-10-CM | POA: Diagnosis not present

## 2024-10-08 DIAGNOSIS — G8929 Other chronic pain: Secondary | ICD-10-CM | POA: Diagnosis not present

## 2024-10-08 DIAGNOSIS — G309 Alzheimer's disease, unspecified: Secondary | ICD-10-CM | POA: Diagnosis not present

## 2024-11-15 DIAGNOSIS — F02B2 Dementia in other diseases classified elsewhere, moderate, with psychotic disturbance: Secondary | ICD-10-CM | POA: Diagnosis not present

## 2024-11-15 DIAGNOSIS — F5101 Primary insomnia: Secondary | ICD-10-CM | POA: Diagnosis not present

## 2024-11-15 DIAGNOSIS — G309 Alzheimer's disease, unspecified: Secondary | ICD-10-CM | POA: Diagnosis not present

## 2024-11-15 DIAGNOSIS — F338 Other recurrent depressive disorders: Secondary | ICD-10-CM | POA: Diagnosis not present

## 2024-11-29 DIAGNOSIS — F02B2 Dementia in other diseases classified elsewhere, moderate, with psychotic disturbance: Secondary | ICD-10-CM | POA: Diagnosis not present

## 2024-11-29 DIAGNOSIS — G309 Alzheimer's disease, unspecified: Secondary | ICD-10-CM | POA: Diagnosis not present

## 2024-11-29 DIAGNOSIS — F5101 Primary insomnia: Secondary | ICD-10-CM | POA: Diagnosis not present

## 2024-11-29 DIAGNOSIS — F338 Other recurrent depressive disorders: Secondary | ICD-10-CM | POA: Diagnosis not present

## 2024-12-02 DIAGNOSIS — Z961 Presence of intraocular lens: Secondary | ICD-10-CM | POA: Diagnosis not present

## 2024-12-02 DIAGNOSIS — H04123 Dry eye syndrome of bilateral lacrimal glands: Secondary | ICD-10-CM | POA: Diagnosis not present

## 2024-12-08 DIAGNOSIS — L602 Onychogryphosis: Secondary | ICD-10-CM | POA: Diagnosis not present

## 2024-12-08 DIAGNOSIS — L84 Corns and callosities: Secondary | ICD-10-CM | POA: Diagnosis not present

## 2024-12-08 DIAGNOSIS — L603 Nail dystrophy: Secondary | ICD-10-CM | POA: Diagnosis not present
